# Patient Record
Sex: Female | Born: 1935 | Race: White | Hispanic: No | Smoking: Never smoker
Health system: Southern US, Community
[De-identification: ages and names within clinical notes are randomized; demographics above are authoritative.]

## PROBLEM LIST (undated history)

## (undated) DIAGNOSIS — S5290XA Unspecified fracture of unspecified forearm, initial encounter for closed fracture: Secondary | ICD-10-CM

## (undated) DIAGNOSIS — K449 Diaphragmatic hernia without obstruction or gangrene: Secondary | ICD-10-CM

## (undated) DIAGNOSIS — F0390 Unspecified dementia without behavioral disturbance: Secondary | ICD-10-CM

## (undated) DIAGNOSIS — N3941 Urge incontinence: Secondary | ICD-10-CM

## (undated) DIAGNOSIS — G4730 Sleep apnea, unspecified: Secondary | ICD-10-CM

## (undated) DIAGNOSIS — K259 Gastric ulcer, unspecified as acute or chronic, without hemorrhage or perforation: Secondary | ICD-10-CM

## (undated) DIAGNOSIS — I10 Essential (primary) hypertension: Secondary | ICD-10-CM

## (undated) DIAGNOSIS — K5790 Diverticulosis of intestine, part unspecified, without perforation or abscess without bleeding: Secondary | ICD-10-CM

## (undated) DIAGNOSIS — IMO0002 Reserved for concepts with insufficient information to code with codable children: Secondary | ICD-10-CM

## (undated) DIAGNOSIS — K317 Polyp of stomach and duodenum: Secondary | ICD-10-CM

## (undated) DIAGNOSIS — R011 Cardiac murmur, unspecified: Secondary | ICD-10-CM

## (undated) DIAGNOSIS — M1990 Unspecified osteoarthritis, unspecified site: Secondary | ICD-10-CM

## (undated) DIAGNOSIS — D649 Anemia, unspecified: Secondary | ICD-10-CM

## (undated) DIAGNOSIS — K219 Gastro-esophageal reflux disease without esophagitis: Secondary | ICD-10-CM

## (undated) DIAGNOSIS — K2970 Gastritis, unspecified, without bleeding: Secondary | ICD-10-CM

## (undated) DIAGNOSIS — D509 Iron deficiency anemia, unspecified: Secondary | ICD-10-CM

## (undated) HISTORY — DX: Gastritis, unspecified, without bleeding: K29.70

## (undated) HISTORY — DX: Essential (primary) hypertension: I10

## (undated) HISTORY — DX: Unspecified osteoarthritis, unspecified site: M19.90

## (undated) HISTORY — DX: Gastro-esophageal reflux disease without esophagitis: K21.9

## (undated) HISTORY — DX: Cardiac murmur, unspecified: R01.1

## (undated) HISTORY — PX: ABDOMINAL HYSTERECTOMY: SHX81

## (undated) HISTORY — PX: CATARACT EXTRACTION, BILATERAL: SHX1313

## (undated) HISTORY — DX: Anemia, unspecified: D64.9

## (undated) HISTORY — DX: Urge incontinence: N39.41

## (undated) HISTORY — DX: Diverticulosis of intestine, part unspecified, without perforation or abscess without bleeding: K57.90

## (undated) HISTORY — DX: Iron deficiency anemia, unspecified: D50.9

## (undated) HISTORY — DX: Unspecified fracture of unspecified forearm, initial encounter for closed fracture: S52.90XA

## (undated) HISTORY — DX: Reserved for concepts with insufficient information to code with codable children: IMO0002

## (undated) SURGERY — Surgical Case
Anesthesia: *Unknown

---

## 2010-10-01 ENCOUNTER — Telehealth (INDEPENDENT_AMBULATORY_CARE_PROVIDER_SITE_OTHER): Payer: Self-pay | Admitting: *Deleted

## 2010-10-01 ENCOUNTER — Ambulatory Visit: Payer: Self-pay | Admitting: Internal Medicine

## 2010-10-01 DIAGNOSIS — D649 Anemia, unspecified: Secondary | ICD-10-CM | POA: Insufficient documentation

## 2010-10-01 DIAGNOSIS — R195 Other fecal abnormalities: Secondary | ICD-10-CM | POA: Insufficient documentation

## 2010-10-01 DIAGNOSIS — K219 Gastro-esophageal reflux disease without esophagitis: Secondary | ICD-10-CM | POA: Insufficient documentation

## 2010-10-20 ENCOUNTER — Ambulatory Visit (HOSPITAL_COMMUNITY)
Admission: RE | Admit: 2010-10-20 | Discharge: 2010-10-20 | Payer: Self-pay | Source: Home / Self Care | Admitting: Internal Medicine

## 2010-10-20 ENCOUNTER — Telehealth (INDEPENDENT_AMBULATORY_CARE_PROVIDER_SITE_OTHER): Payer: Self-pay

## 2010-10-20 DIAGNOSIS — K5790 Diverticulosis of intestine, part unspecified, without perforation or abscess without bleeding: Secondary | ICD-10-CM | POA: Diagnosis present

## 2010-10-20 HISTORY — DX: Diverticulosis of intestine, part unspecified, without perforation or abscess without bleeding: K57.90

## 2010-11-19 ENCOUNTER — Encounter (INDEPENDENT_AMBULATORY_CARE_PROVIDER_SITE_OTHER): Payer: Self-pay

## 2010-11-20 ENCOUNTER — Encounter: Payer: Self-pay | Admitting: Internal Medicine

## 2010-11-25 ENCOUNTER — Encounter: Payer: Self-pay | Admitting: Internal Medicine

## 2010-12-08 ENCOUNTER — Ambulatory Visit (HOSPITAL_COMMUNITY)
Admission: RE | Admit: 2010-12-08 | Discharge: 2010-12-08 | Payer: Self-pay | Source: Home / Self Care | Attending: Internal Medicine | Admitting: Internal Medicine

## 2010-12-08 DIAGNOSIS — K2970 Gastritis, unspecified, without bleeding: Secondary | ICD-10-CM

## 2010-12-08 HISTORY — DX: Gastritis, unspecified, without bleeding: K29.70

## 2010-12-16 NOTE — Letter (Signed)
Summary: TCS ORDER  TCS ORDER   Imported By: Ave Filter 10/01/2010 12:19:01  _____________________________________________________________________  External Attachment:    Type:   Image     Comment:   External Document

## 2010-12-16 NOTE — Op Note (Signed)
  NAMEKRISANN, Wanda [name]             ACCOUNT NO.:  [id number]  MEDICAL RECORD NO.:  [id number]          PATIENT TYPE:  AMB  LOCATION:  DAY                           FACILITY:  [facility]  PHYSICIAN:  R. Roetta Sessions, M.D. DATE OF BIRTH:  February 22, 1933  DATE OF PROCEDURE:  12/08/2010 DATE OF DISCHARGE:                              OPERATIVE REPORT   INDICATIONS FOR PROCEDURE:  The patient is a 77 year old lady who has a significant anemia and occult blood positive stool.  She had a colonoscopy recently, longstanding GERD symptoms.  EGD is now being done to further evaluate longstanding GERD and upper GI tract as to cause which may be contributing to her anemia.  Risks, benefits, limitations, alternatives, imponderables have been reviewed.  Questions have been answered.  Please see the documentation for the medical record.  PROCEDURE NOTE:  O2 saturation, blood pressure, pulse, and respirations were monitored throughout the entirety of the procedure.  CONSCIOUS SEDATION:  Versed 3 mg IV, Demerol 75 mg IV in divided doses.  INSTRUMENT:  Pentax video chip system.  FINDINGS:  Examination of tubular esophagus revealed entirely normal- appearing mucosa.  EG junction was easily traversed.  Stomach insufflated well with air.  Thorough examination of the gastric mucosa including retroflexed proximal stomach and esophagogastric junction demonstrated a moderate size hiatal hernia, some focal antral erythema but no erosion, ulcer, or infiltrating process were seen.  Pylorus was patent, easily traversed.  Examination of the bulb and second portion revealed only a duodenal diverticulum in the second portion of duodenum.  THERAPEUTIC/DIAGNOSTIC MANEUVERS PERFORMED: 1. Biopsies of the D2 were taken for histologic study. 2. Biopsies of the antral mucosa were taken.  The patient tolerated     the procedure well, was reactive in endoscopy.  IMPRESSION: 1. Normal esophagus.  Moderate-sized hiatal hernia,  focal antral     erythema of uncertain significance status post biopsy. 2. Duodenal diverticulum, otherwise D1 through D2 appeared normal     status post biopsy D2.  RECOMMENDATIONS: 1. GERD literature provided to Ms. Catoe. 2. Continue omeprazole 20 mg orally daily. 3. Follow up on path. 4. Further recommendations to follow.     Jonathon Bellows, M.D.     RMR/MEDQ  D:  12/08/2010  T:  12/08/2010  Job:  [id number]  Electronically Signed by Lorrin Goodell M.D. on 12/16/2010 01:40:06 PM

## 2010-12-16 NOTE — Progress Notes (Signed)
Summary: [facility] procedure to the week of 12/5  Phone Note Call from Patient   Summary of Call: Pt needs to [facility] her procedure. She is asking to have it done the week of Dec 5th. You can reach her at [phone] Initial call taken by: Diana Eves,  October 01, 2010 1:57 PM     Appended Document: [facility] procedure to the week of 12/5 Pt rescheduled to 10/20/10@10 :00..Pt aware of appt change.

## 2010-12-16 NOTE — Progress Notes (Signed)
Summary: phone note/ prep rx called to pharmacy  Phone Note Other Incoming   Caller: Britta Mccreedy from Day [facility] Summary of Call: Per Britta Mccreedy in [facility], Dr. Jena Gauss said call in 2L Go [place]yt[place]...8 0z every 15 min til gone. I called to Reed @ [facility] in [place]. Initial call taken by: Cloria Spring LPN,  October 20, 2010 8:21 AM

## 2010-12-16 NOTE — Letter (Signed)
Summary: REFERRAL FROM DR Jarold Motto  REFERRAL FROM DR PATTERSON   Imported By: Rexene Alberts 10/01/2010 16:36:16  _____________________________________________________________________  External Attachment:    Type:   Image     Comment:   External Document

## 2010-12-17 ENCOUNTER — Encounter: Payer: Self-pay | Admitting: Internal Medicine

## 2010-12-18 ENCOUNTER — Encounter: Payer: Self-pay | Admitting: Internal Medicine

## 2010-12-18 NOTE — Letter (Signed)
Summary: TRIAGE ORDER  TRIAGE ORDER   Imported By: Ave Filter 11/25/2010 10:50:16  _____________________________________________________________________  External Attachment:    Type:   Image     Comment:   External Document  Appended Document: TRIAGE ORDER ok as is

## 2010-12-18 NOTE — Assessment & Plan Note (Signed)
Summary: HEME+STOOLS/SS   Visit Type:  Initial Consult Referring Provider:  Ninfa Linden, FNP Primary Care Provider:  Ninfa Linden, FNP  CC:  Heme pos stools.  History of Present Illness: Wanda [name] is a 77 year old Caucasian female who presents today at the request of Ms. Ninfa Linden, FNP secondary to heme + stools. Reports 1-2 BMs daily, soft. if eating out reports postprandial  diarrhea, otherwise normal BM daily. Denies melena, hematochezia. Denies abdominal pain. Denies N/V. Reports losing 8 lbs earlier this year, six months later regained. Had stopped drinking high caloric drinks. Now back to baseline. No lack of appetite. GERD controlled on once daily Prilosec. Reports intermittent fatigue, but she states she is very active.  H/H drawn 11/10: 10.4, 32. Was prescribed iron supplement but hasn't started yet. Ferritin normal. Reports no hx of anemia. No prior TCS.    H/H drawn 11/10: 10.4, 32, MCV: 85 (nl), MCH 27.5 (nl), RDW: 14.6% (nl) TIBC: 402 (nl), Ferritin: 23 (nl)  Current Medications (verified): 1)  Omeprazole 20 Mg Cpdr (Omeprazole) .... Take 1 Tablet By Mouth Once A Day 2)  Lisinopril 10 Mg Tabs (Lisinopril) .... Take 1 Tablet By Mouth Once A Day 3)  Vitamin D 2000 Iu .... Take 1 Tablet By Mouth Once A Day 4)  Aspir-Low 81 Mg Tbec (Aspirin) .... Take 1 Tablet By Mouth Once A Day  Allergies (verified): 1)  ! * Aleve  Past History:  Past Medical History: HTN GERD Vitamin D deficiency OA heart murmur  Past Surgical History: None  Family History: Mother: 46, HTN Father: 34, no significant hx siblings: 2 brothers, incidence of lymphoma No FH of Colon Cancer:  Social History: Widowed X 6 years  Patient has never smoked.  Alcohol Use - no Patient gets regular exercise. Smoking Status:  never Does Patient Exercise:  yes  Review of Systems General:  Denies fever, chills, and anorexia. Eyes:  Denies blurring, irritation, and discharge. ENT:   Denies sore throat, hoarseness, and difficulty swallowing. CV:  Denies chest pains and dyspnea on exertion. Resp:  Denies dyspnea at rest, dyspnea with exercise, and wheezing. GI:  Denies difficulty swallowing, pain on swallowing, nausea, abdominal pain, constipation, bloody BM's, and black BMs. GU:  Denies urinary burning and blood in urine. MS:  Denies joint pain / LOM and joint deformity. Derm:  Denies rash, itching, and dry skin. Neuro:  Denies weakness and syncope. Psych:  Denies depression and anxiety.  Vital Signs:  Patient profile:   77 year old female Height:      61 inches Weight:      141 pounds BMI:     26.74 Temp:     97.9 degrees F oral Pulse rate:   76 / minute BP sitting:   138 / 78  (right arm) Cuff size:   regular  Vitals Entered By: Cloria Spring LPN (October 01, 2010 10:36 AM)  Impression & Recommendations:  Problem # 1:  FECAL OCCULT BLOOD (ICD-31.27)  77 year old female with no hx of prior colonoscopy, heme+ stool, denies abd pain, N/V. Denies melena, hematochezia. Normal BM daily, postprandial diarrhea if "eats out". no prior hx of anemia but recent labs with H/H: 10.4, 32. Ferritin normal.   TCS with Dr. Jena Gauss: the risks and benefits have been discussed in detail with pt; she desires to proceed and gave verbal consent  Orders: Consultation Level III (04540)  Problem # 2:  ANEMIA-UNSPECIFIED (ICD-285.9)  No prior hx of anemia, TCS to be performed in near future.  See #1.   Orders: Consultation Level III ([address])  Problem # 3:  GERD (ICD-530.81)  Hx of GERD but well-controlled without breakthrough symptoms on once daily PPI.  Continue current management  Orders: Consultation Level III [phone]) We would like to thank Ms. Ninfa Linden, FNP, for the referral of this pleasant lady.

## 2010-12-18 NOTE — Miscellaneous (Signed)
Summary: cbc from [facility]  Clinical Lists Changes               Perform Date: 5 Dec11 14:25  Ordered By: Jena Gauss MD , Gerrit Friends           Ordered Date: 5 Dec11 14:22                                       Last Updated Date: 5 Dec11 15:49  Facility: [facility]                               Department: [facility]  Accession #: [id number]                    USN:       [id number]  Findings  Result Name                              Result     Abnl   Normal Range     Units      Perf. Loc.  WBC                                           9.1               4.0-10.5         K/uL  RBC                                           3.45       l      3.87-5.11        MIL/uL  Hemoglobin (HGB)                        9.3        l      12.0-15.0        g/dL  Hematocrit (HCT)                         28.5       l      36.0-46.0        %  MCV                                           82.6              78.0-100.0       fL  MCH -                                         27.0              26.0-34.0        pg  MCHC  32.6              30.0-36.0        g/dL  RDW                                           14.3              11.5-15.5        %  Platelet Count (PLT)                     435        h      150-400          K/uL  Additional Information  HL7 RESULT STATUS : F  External IF Update Timestamp : 2010-10-20:15:47:00.000000  Appended Document: cbc from [facility] anemia more pronounced; I don't see seum Fe or sat results regarding fe studies - they need to be in EMR - pt needs and EGD if not recently done to further evaluate longstanding GERD, anemia and blood in stool  Appended Document: cbc from [facility] called pt- LM with above information per pt request  Appended Document: cbc from [facility] Pt scheduled for egd on 12/08/08@10 :00a.m.

## 2010-12-24 LAB — CONVERTED CEMR LAB
Basophils Absolute: 0.1 10*3/uL (ref 0.0–0.1)
Basophils Relative: 1 % (ref 0–1)
Eosinophils Absolute: 0.5 10*3/uL (ref 0.0–0.7)
Eosinophils Relative: 6 % — ABNORMAL HIGH (ref 0–5)
HCT: 34 % — ABNORMAL LOW (ref 36.0–46.0)
Hemoglobin: 10.4 g/dL — ABNORMAL LOW (ref 12.0–15.0)
Lymphocytes Relative: 36 % (ref 12–46)
Lymphs Abs: 2.9 10*3/uL (ref 0.7–4.0)
MCHC: 30.6 g/dL (ref 30.0–36.0)
MCV: 86.1 fL (ref 78.0–100.0)
Monocytes Absolute: 0.6 10*3/uL (ref 0.1–1.0)
Monocytes Relative: 7 % (ref 3–12)
Neutro Abs: 4 10*3/uL (ref 1.7–7.7)
Neutrophils Relative %: 50 % (ref 43–77)
Platelets: 389 10*3/uL (ref 150–400)
RBC: 3.95 M/uL (ref 3.87–5.11)
RDW: 17.2 % — ABNORMAL HIGH (ref 11.5–15.5)
WBC: 8 10*3/uL (ref 4.0–10.5)

## 2010-12-24 NOTE — Miscellaneous (Signed)
Summary: Orders Update  Clinical Lists Changes  Orders: Added new Test order of T-CBC w/Diff (85025-10010) - Signed 

## 2010-12-26 ENCOUNTER — Encounter (INDEPENDENT_AMBULATORY_CARE_PROVIDER_SITE_OTHER): Payer: Medicare Other | Admitting: Internal Medicine

## 2010-12-26 ENCOUNTER — Encounter: Payer: Self-pay | Admitting: Internal Medicine

## 2010-12-26 DIAGNOSIS — D649 Anemia, unspecified: Secondary | ICD-10-CM

## 2011-01-01 NOTE — Assessment & Plan Note (Addendum)
Summary: DROPPED STOOL OFF/LAW  pt returned ifobt and it was negative  Allergies: 1)  ! * Aleve   Other Orders: Immuno-chemical Fecal Occult ([address])   Orders Added: 1)  Immuno-chemical Fecal Occult [82274]  Appended Document: DROPPED STOOL OFF/LAW PT IS AWARE OF THE RESULTS BUT IS QUESTIONING WHAT DOES SHE NEED TO DO NEXT?  Appended Document: DROPPED STOOL OFF/LAW repeat cbc w ov extender 8 weeks  Appended Document: DROPPED STOOL OFF/LAW LMOM with above information. lab order on file  Appended Document: DROPPED STOOL OFF/LAW LMOM with OV of 4/23 @ 1030 w/KJ. I asked for pt to call me back to confirm OV  Appended Document: DROPPED STOOL OFF/LAW pt is aware with appt and knows to have her labs done prior

## 2011-01-13 ENCOUNTER — Encounter: Payer: Self-pay | Admitting: Internal Medicine

## 2011-01-22 NOTE — Miscellaneous (Signed)
Summary: Orders Update  Clinical Lists Changes  Orders: Added new Test order of T-CBC w/Diff (85025-10010) - Signed 

## 2011-01-23 ENCOUNTER — Encounter (INDEPENDENT_AMBULATORY_CARE_PROVIDER_SITE_OTHER): Payer: Self-pay

## 2011-01-27 LAB — CBC
HCT: 28.5 % — ABNORMAL LOW (ref 36.0–46.0)
Hemoglobin: 9.3 g/dL — ABNORMAL LOW (ref 12.0–15.0)
MCH: 27 pg (ref 26.0–34.0)
MCHC: 32.6 g/dL (ref 30.0–36.0)
MCV: 82.6 fL (ref 78.0–100.0)
Platelets: 435 10*3/uL — ABNORMAL HIGH (ref 150–400)
RBC: 3.45 MIL/uL — ABNORMAL LOW (ref 3.87–5.11)
RDW: 14.3 % (ref 11.5–15.5)
WBC: 9.1 10*3/uL (ref 4.0–10.5)

## 2011-01-27 NOTE — Letter (Signed)
Summary: Recall, Labs Needed  [facility]  [address]   [place], [place] [address]   Phone: [phone]  Fax: [phone]    January 23, 2011  [facility] [address] [place], [place]  [address]  [place] Mar 08, 1933   Dear Wanda [name],   Our records indicate it is time to repeat your blood work.  You can take the enclosed form to the lab on or near the date indicated.  Please make note of the new location of the lab:   [address], [address]nd floor   [facility]  Our office will call you within a week to ten business days with the results.  If you do not hear from [place] in 10 business days, you should call the office.  If you have any questions regarding this, call the office at [phone], and ask for the nurse.  Labs are due on 02/18/11.   Sincerely,    Hendricks Limes LPN  [facility] Ph: [phone]   Fax: [phone]

## 2011-03-02 ENCOUNTER — Other Ambulatory Visit: Payer: Self-pay | Admitting: Internal Medicine

## 2011-03-02 LAB — CBC WITH DIFFERENTIAL/PLATELET
Basophils Absolute: 0.1 10*3/uL (ref 0.0–0.1)
Basophils Relative: 1 % (ref 0–1)
Eosinophils Absolute: 0.4 10*3/uL (ref 0.0–0.7)
Eosinophils Relative: 4 % (ref 0–5)
HCT: 35 % — ABNORMAL LOW (ref 36.0–46.0)
Hemoglobin: 11 g/dL — ABNORMAL LOW (ref 12.0–15.0)
Lymphocytes Relative: 34 % (ref 12–46)
Lymphs Abs: 3.2 10*3/uL (ref 0.7–4.0)
MCH: 27.2 pg (ref 26.0–34.0)
MCHC: 31.4 g/dL (ref 30.0–36.0)
MCV: 86.6 fL (ref 78.0–100.0)
Monocytes Absolute: 0.5 10*3/uL (ref 0.1–1.0)
Monocytes Relative: 6 % (ref 3–12)
Neutro Abs: 5.2 10*3/uL (ref 1.7–7.7)
Neutrophils Relative %: 56 % (ref 43–77)
Platelets: 359 10*3/uL (ref 150–400)
RBC: 4.04 MIL/uL (ref 3.87–5.11)
RDW: 15.9 % — ABNORMAL HIGH (ref 11.5–15.5)
WBC: 9.3 10*3/uL (ref 4.0–10.5)

## 2011-03-09 ENCOUNTER — Ambulatory Visit (INDEPENDENT_AMBULATORY_CARE_PROVIDER_SITE_OTHER): Payer: Medicare Other | Admitting: Urgent Care

## 2011-03-09 ENCOUNTER — Encounter: Payer: Self-pay | Admitting: Urgent Care

## 2011-03-09 VITALS — BP 160/82 | HR 71 | Temp 97.9°F | Ht 61.0 in | Wt 142.6 lb

## 2011-03-09 DIAGNOSIS — D649 Anemia, unspecified: Secondary | ICD-10-CM

## 2011-03-09 DIAGNOSIS — K219 Gastro-esophageal reflux disease without esophagitis: Secondary | ICD-10-CM

## 2011-03-09 NOTE — Assessment & Plan Note (Signed)
GERD/gastritis (non-H. pylori) well-controlled on omeprazole 20 mg daily.

## 2011-03-09 NOTE — Progress Notes (Signed)
Referring Provider: Ninfa Linden, NP Primary Care Physician:  Ninfa Linden, NP  Chief Complaint  Patient presents with  . Follow-up    tired    HPI:  Wanda [name] is a 77 y.o. female here for follow up for chronic anemia. She was found to have chronic anemia with a hemoglobin in the 9 range approximately 5 months ago and Hemoccult-positive stool. She underwent evaluation by Dr. work with colonoscopy and EGD which showed no H. pylori gastritis and left-sided diverticulosis. She denies any rectal bleeding or melena. Her only concern is some fatigue which is gradually getting better. She previously had a normal ferritin and iron. She has discontinued aspirin 81 mg daily. She is taking omeprazole 20 mg daily for history of GERD which is well controlled. She denies any abdominal pain, anorexia, nausea or vomiting. She denies any dysphagia or odynophagia.  Past Medical History  Diagnosis Date  . Anemia     Last hemoglobin 11 on 03/02/11  . Heme positive stool 09/2010     ifobt negative 4/12  . GERD (gastroesophageal reflux disease)   . Gastritis 12/08/10    EGD by Dr. Nancy Fetter hiatal hernia, duodenal diverticulum, chronic gastritis  . Diverticulosis 10/20/10    Left-sided on colonoscopy by Dr. Jena Gauss 10/20/10  . HTN (hypertension)   . Vitamin D deficiency   . Osteoarthritis   . Heart murmur     No past surgical history on file.  Current Outpatient Prescriptions  Medication Sig Dispense Refill  . lisinopril (PRINIVIL,ZESTRIL) 20 MG tablet Take 20 mg by mouth daily.        Marland Kitchen omeprazole (PRILOSEC) 20 MG capsule Take 20 mg by mouth daily.        Marland Kitchen UNABLE TO FIND vitaimin d daily      . VITAMIN D, CHOLECALCIFEROL, PO Take 400 Int'l Units/day by mouth daily.          Allergies as of 03/09/2011 - Review Complete 03/09/2011  Allergen Reaction Noted  . Naproxen sodium     Family history: There is no known family history of colorectal carcinoma , liver disease, or inflammatory  bowel disease.  Review of Systems: Gen: Denies any fever, chills, sweats, anorexia, weakness, malaise, weight loss, and sleep disorder CV: Denies chest pain, angina, palpitations, syncope, orthopnea, PND, peripheral edema, and claudication. Resp: Denies dyspnea at rest, dyspnea with exercise, cough, sputum, wheezing, coughing up blood, and pleurisy. GI: Denies vomiting blood, jaundice, and fecal incontinence.   Denies dysphagia or odynophagia. Derm: Denies rash, itching, dry skin, hives, moles, warts, or unhealing ulcers.  Psych: Denies depression, anxiety, memory loss, suicidal ideation, hallucinations, paranoia, and confusion. Heme: Denies bruising, bleeding, and enlarged lymph nodes.  Physical Exam: BP 160/82  Pulse 71  Temp 97.9 F (36.6 C)  Ht 5\' 1"  (1.549 m)  Wt 142 lb 9.6 oz (64.683 kg)  BMI 26.94 kg/m2 General:   Alert,  Well-developed, well-nourished, pleasant and cooperative in NAD. Head:  Normocephalic and atraumatic. Eyes:  Sclera clear, no icterus.   Conjunctiva pink. Mouth:  No deformity or lesions, dentition normal. Neck:  Supple; no masses or thyromegaly. Heart:  Regular rate and rhythm; no murmurs, clicks, rubs,  or gallops. Abdomen:  Soft, nontender and nondistended. No masses, hepatosplenomegaly or hernias noted. Normal bowel sounds, without guarding, and without rebound.   Msk:  Symmetrical without gross deformities. Normal posture. Pulses:  Normal pulses noted. Extremities:  Without clubbing or edema. Neurologic:  Alert and  oriented x4;  grossly normal neurologically.  Skin:  Intact without significant lesions or rashes. Cervical Nodes:  No significant cervical adenopathy. Psych:  Alert and cooperative. Normal mood and affect.

## 2011-03-09 NOTE — Assessment & Plan Note (Addendum)
Wanda [name] is a 77 year old Caucasian female with recent diagnosis of normocytic anemia and positive Hemoccult who has under went colonoscopy and EGD to look for occult GI bleed or malignancy. She does have non-H. pylori gastritis and in the setting of aspirin and this could be a cause of trivial GI bleed. She denies any gross bleeding. Her hemoglobin has improved almost 2 g.  Last iFobt negative. She does have chronic GERD well controlled on PPI. At this point would not pursue further GI workup, however if hemoglobin declines, would consider small bowel given capsule study to evaluate her small bowel for occult lesions.  Begin a multivitamin with iron Recheck hemoglobin in 2 months Call if any signs of bleeding Follow up with your primary care physician Continue omeprazole 20 mg daily Resume aspirin 81 mg daily

## 2011-03-09 NOTE — Patient Instructions (Addendum)
Recheck CBC in 2months Call if any blood in stools Begin Multivitamin with iron daily You may resume baby ASA 81mg  daily Continue omeprazole 20 mg daily

## 2011-06-15 ENCOUNTER — Telehealth: Payer: Self-pay | Admitting: Urgent Care

## 2011-06-15 NOTE — Telephone Encounter (Signed)
Mailed reminder letter to pt. 

## 2011-06-15 NOTE — Telephone Encounter (Signed)
Did pt get CBC done as planned?

## 2011-06-18 ENCOUNTER — Other Ambulatory Visit: Payer: Self-pay | Admitting: Urgent Care

## 2011-06-19 LAB — CBC WITH DIFFERENTIAL/PLATELET
Basophils Absolute: 0 10*3/uL (ref 0.0–0.1)
Basophils Relative: 0 % (ref 0–1)
Eosinophils Absolute: 0.2 10*3/uL (ref 0.0–0.7)
Eosinophils Relative: 2 % (ref 0–5)
HCT: 37.2 % (ref 36.0–46.0)
Hemoglobin: 11.6 g/dL — ABNORMAL LOW (ref 12.0–15.0)
Lymphocytes Relative: 33 % (ref 12–46)
Lymphs Abs: 3 10*3/uL (ref 0.7–4.0)
MCH: 29.2 pg (ref 26.0–34.0)
MCHC: 31.2 g/dL (ref 30.0–36.0)
MCV: 93.7 fL (ref 78.0–100.0)
Monocytes Absolute: 0.5 10*3/uL (ref 0.1–1.0)
Monocytes Relative: 6 % (ref 3–12)
Neutro Abs: 5.1 10*3/uL (ref 1.7–7.7)
Neutrophils Relative %: 58 % (ref 43–77)
Platelets: 298 10*3/uL (ref 150–400)
RBC: 3.97 MIL/uL (ref 3.87–5.11)
RDW: 14.4 % (ref 11.5–15.5)
WBC: 8.9 10*3/uL (ref 4.0–10.5)

## 2011-06-19 NOTE — Progress Notes (Signed)
Quick Note:  Please call pt & let her know her hgb is much better/almost normal. FU w/ her PCP. Call if she has any bleeding or GI problems. Thanks ______

## 2011-11-28 ENCOUNTER — Emergency Department (HOSPITAL_COMMUNITY): Payer: Medicare Other

## 2011-11-28 ENCOUNTER — Encounter (HOSPITAL_COMMUNITY): Payer: Self-pay

## 2011-11-28 ENCOUNTER — Emergency Department (HOSPITAL_COMMUNITY)
Admission: EM | Admit: 2011-11-28 | Discharge: 2011-11-28 | Disposition: A | Discharge status: Home / Self Care | Payer: Medicare Other | Attending: Emergency Medicine | Admitting: Emergency Medicine

## 2011-11-28 DIAGNOSIS — I10 Essential (primary) hypertension: Secondary | ICD-10-CM | POA: Insufficient documentation

## 2011-11-28 DIAGNOSIS — S62101A Fracture of unspecified carpal bone, right wrist, initial encounter for closed fracture: Secondary | ICD-10-CM

## 2011-11-28 DIAGNOSIS — M19049 Primary osteoarthritis, unspecified hand: Secondary | ICD-10-CM | POA: Insufficient documentation

## 2011-11-28 DIAGNOSIS — S59909A Unspecified injury of unspecified elbow, initial encounter: Secondary | ICD-10-CM | POA: Insufficient documentation

## 2011-11-28 DIAGNOSIS — S52599A Other fractures of lower end of unspecified radius, initial encounter for closed fracture: Secondary | ICD-10-CM | POA: Insufficient documentation

## 2011-11-28 DIAGNOSIS — Z79899 Other long term (current) drug therapy: Secondary | ICD-10-CM | POA: Insufficient documentation

## 2011-11-28 DIAGNOSIS — M25539 Pain in unspecified wrist: Secondary | ICD-10-CM | POA: Insufficient documentation

## 2011-11-28 DIAGNOSIS — M79609 Pain in unspecified limb: Secondary | ICD-10-CM | POA: Insufficient documentation

## 2011-11-28 DIAGNOSIS — R5381 Other malaise: Secondary | ICD-10-CM | POA: Insufficient documentation

## 2011-11-28 DIAGNOSIS — W010XXA Fall on same level from slipping, tripping and stumbling without subsequent striking against object, initial encounter: Secondary | ICD-10-CM | POA: Insufficient documentation

## 2011-11-28 DIAGNOSIS — K219 Gastro-esophageal reflux disease without esophagitis: Secondary | ICD-10-CM | POA: Insufficient documentation

## 2011-11-28 DIAGNOSIS — S6990XA Unspecified injury of unspecified wrist, hand and finger(s), initial encounter: Secondary | ICD-10-CM | POA: Insufficient documentation

## 2011-11-28 NOTE — ED Provider Notes (Signed)
Medical screening examination/treatment/procedure(s) were conducted as a shared visit with non-physician practitioner(s) and myself.  I personally evaluated the patient during the encounter.  Please see completed note for this visit.  Raeford Razor, MD 11/28/11 2125

## 2011-11-28 NOTE — ED Provider Notes (Signed)
Medical screening examination/treatment/procedure(s) were conducted as a shared visit with non-physician practitioner(s) and myself.  I personally evaluated the patient during the encounter.  96EA with R wrist pain. FOOSH. Buckle fracture distal radius. No intrarticular involvement. No significant tenderness in snuffbox. Neurovascularly intact distally. Plan splint and pain meds. Ortho fu.  Raeford Razor, MD 11/28/11 2013

## 2011-11-28 NOTE — ED Provider Notes (Signed)
History     CSN: [id number]  Arrival date & time 11/28/11  4098   First MD Initiated Contact with Patient 11/28/11 2011      Chief Complaint  Patient presents with  . Wrist Pain  . Hand Pain    (Consider location/radiation/quality/duration/timing/severity/associated sxs/prior treatment) HPI Comments: Patient states she slipped on a slippery wet deck. She fell on an outstretched hand in the grass. She sustained injury to the right wrist. She denies any other injury. He denies being on blood thinning medications. She has not had previous surgery or procedures on the right hand or wrist. She presents now for assistance with this particular problem.  Patient is a 78 y.o. female presenting with wrist pain and hand pain. The history is provided by the patient.  Wrist Pain Associated symptoms include arthralgias and fatigue. Pertinent negatives include no abdominal pain, chest pain, coughing or neck pain.  Hand Pain Associated symptoms include arthralgias and fatigue. Pertinent negatives include no abdominal pain, chest pain, coughing or neck pain.    Past Medical History  Diagnosis Date  . Anemia     Last hemoglobin 11 on 03/02/11  . Heme positive stool 09/2010     ifobt negative 4/12  . GERD (gastroesophageal reflux disease)   . Gastritis 12/08/10    EGD by Dr. Nancy Fetter hiatal hernia, duodenal diverticulum, chronic gastritis  . Diverticulosis 10/20/10    Left-sided on colonoscopy by Dr. Jena Gauss 10/20/10  . HTN (hypertension)   . Vitamin D deficiency   . Osteoarthritis   . Heart murmur     History reviewed. No pertinent past surgical history.  Family History  Problem Relation Age of Onset  . Hypertension Mother   . Lymphoma Brother     History  Substance Use Topics  . Smoking status: Never Smoker   . Smokeless tobacco: Not on file  . Alcohol Use: No    OB History    Grav Para Term Preterm Abortions TAB SAB Ect Mult Living                  Review of Systems    Constitutional: Positive for fatigue. Negative for activity change.       All ROS Neg except as noted in HPI  HENT: Negative for nosebleeds and neck pain.   Eyes: Negative for photophobia and discharge.  Respiratory: Negative for cough, shortness of breath and wheezing.   Cardiovascular: Negative for chest pain and palpitations.  Gastrointestinal: Negative for abdominal pain and blood in stool.  Genitourinary: Negative for dysuria, frequency and hematuria.  Musculoskeletal: Positive for arthralgias. Negative for back pain.  Skin: Negative.   Neurological: Negative for dizziness, seizures and speech difficulty.  Psychiatric/Behavioral: Negative for hallucinations and confusion.    Allergies  Naproxen sodium  Home Medications   Current Outpatient Rx  Name Route Sig Dispense Refill  . LISINOPRIL 20 MG PO TABS Oral Take 20 mg by mouth daily.      Marland Kitchen OMEPRAZOLE 20 MG PO CPDR Oral Take 20 mg by mouth daily.      Marland Kitchen UNABLE TO FIND  vitaimin d daily    . VITAMIN D (CHOLECALCIFEROL) PO Oral Take 400 Int'l Units/day by mouth daily.        BP 181/80  Pulse 80  Temp(Src) 98.5 F (36.9 C) (Oral)  Resp 20  Ht 5\' 2"  (1.575 m)  Wt 135 lb (61.236 kg)  BMI 24.69 kg/m2  SpO2 100%  Physical Exam  Nursing note and vitals  reviewed. Constitutional: She is oriented to person, place, and time. She appears well-developed and well-nourished.  Non-toxic appearance.  HENT:  Head: Normocephalic.  Right Ear: Tympanic membrane and external ear normal.  Left Ear: Tympanic membrane and external ear normal.  Eyes: EOM and lids are normal. Pupils are equal, round, and reactive to light.  Neck: Normal range of motion. Neck supple. Carotid bruit is not present.  Cardiovascular: Normal rate, regular rhythm, normal heart sounds, intact distal pulses and normal pulses.   Pulmonary/Chest: Breath sounds normal. No respiratory distress. She exhibits no tenderness.  Abdominal: Soft. Bowel sounds are normal. There  is no tenderness. There is no guarding.  Musculoskeletal: Normal range of motion.       There is good capillary refill and sensory of the fingers of the right hand. There are degenerative joint disease changes of the MP joints of both hands. There is deformity of the right wrist. There is no pain in the anatomical snuffbox. Full range of motion of the right elbow and shoulder.  Lymphadenopathy:       Head (right side): No submandibular adenopathy present.       Head (left side): No submandibular adenopathy present.    She has no cervical adenopathy.  Neurological: She is alert and oriented to person, place, and time. She has normal strength. No cranial nerve deficit or sensory deficit.  Skin: Skin is warm and dry.  Psychiatric: She has a normal mood and affect. Her speech is normal.    ED Course: Elevated blood pressure noted. Patient has history of the same. She will have this rechecked by her primary physician next week.   Procedures (including critical care time) Pulse oximetry 100% on room air. Within normal limits by my interpretation. Labs Reviewed - No data to display Dg Wrist Complete Right  11/28/2011  *RADIOLOGY REPORT*  Clinical Data: 78 year old female status post fall with pain and swelling.  RIGHT WRIST - COMPLETE 3+ VIEW  Comparison: None.  Findings: Impacted distal right radius fracture has a torus configuration.  No significant displacement or angulation.  The fracture line does extend to the distal radial ulnar joint.  No definite radiocarpal joint involvement.  Incidental chondrocalcinosis which can be seen in the setting of calcium pyrophosphate deposition disease.  Carpal bone alignment is preserved.  Scaphoid appears intact.  Visualized osseous structures in the right hand appear intact.  IMPRESSION: Torus type impacted distal right radius fracture, nondisplaced.  Original Report Authenticated By: Harley Hallmark, M.D.     1. Wrist fracture, right       MDM  I have  reviewed nursing notes, vital signs, and all appropriate lab and imaging results for this patient. Patient sustained a fall on a slippery surface and fell on an outstretched right hand. She sustained a fracture of the right radius. The patient did not sustain injury of any other areas. A sugar tong splint and sling have been applied. The patient is referred to orthopedics. She states she would like to use ibuprofen for her pain at this point.  Patient seen with me by Dr. Juleen China.       Kathie Dike, [place] 11/28/11 2019

## 2011-11-28 NOTE — ED Notes (Signed)
Pt presents with right wrist and hand swelling. Pt states she slipped and fell bracing herself with her right hand. Pt denies LOC. Bruising and swelling noted.

## 2011-11-28 NOTE — ED Notes (Signed)
Pt a/ox4. Resp even and unlabored. NAD at this time. D/C instructions reviewed with pt. Pt verbalized understanding. Pt ambulated to lobby with steady gate.  

## 2011-11-30 ENCOUNTER — Encounter: Payer: Self-pay | Admitting: Orthopedic Surgery

## 2011-11-30 ENCOUNTER — Ambulatory Visit (INDEPENDENT_AMBULATORY_CARE_PROVIDER_SITE_OTHER): Payer: Medicare Other | Admitting: Orthopedic Surgery

## 2011-11-30 VITALS — Ht 62.0 in | Wt 132.0 lb

## 2011-11-30 DIAGNOSIS — S52509A Unspecified fracture of the lower end of unspecified radius, initial encounter for closed fracture: Secondary | ICD-10-CM

## 2011-11-30 DIAGNOSIS — S52599A Other fractures of lower end of unspecified radius, initial encounter for closed fracture: Secondary | ICD-10-CM

## 2011-11-30 MED ORDER — HYDROCODONE-ACETAMINOPHEN 5-325 MG PO TABS: 1.0000 | ORAL_TABLET | ORAL | Status: AC | PRN

## 2011-11-30 NOTE — Progress Notes (Signed)
Patient ID: Wanda [name], female   DOB: 08-26-1933, 78 y.o.   MRN: [id number] Chief complaint: RIGHT wrist HPI:(4) Location of pain RIGHT arm, started January 12 secondary to a fall.  Previous treatment in the emergency room splinting and x-rays were done.  Symptoms include dull pain.  Pain scale is 6/10.  Timing constant.  Improved at this point with no measures that have been initiated.  Associated findings swelling.  Review of systems: Complete and comprehensive system review.  Positive findings fatigue difficulty urinating secondary to prolapsed uterus.  All other symptoms were negative.  ROS:(2) As above  PFSH: (1) As above Past Medical History  Diagnosis Date  . Anemia     Last hemoglobin 11 on 03/02/11  . Heme positive stool 09/2010     ifobt negative 4/12  . GERD (gastroesophageal reflux disease)   . Gastritis 12/08/10    EGD by Dr. Nancy Fetter hiatal hernia, duodenal diverticulum, chronic gastritis  . Diverticulosis 10/20/10    Left-sided on colonoscopy by Dr. Jena Gauss 10/20/10  . HTN (hypertension)   . Vitamin D deficiency   . Osteoarthritis   . Heart murmur      Physical Exam(12) GENERAL: normal development   CDV: pulses are normal   Skin: normal  Lymph: nodes were not palpable/normal  Psychiatric: awake, alert and oriented  Neuro: normal sensation  MSK Ambulation is normal 1 On inspection there is no gross deformity of the wrist.  There is tenderness to palpation decreased range of motion normal muscle tone and no instability. 2 Lower extremity exam  Ambulation is normal.  Inspection and palpation RIGHT upper extremity: revealed no tenderness or abnormality in alignment in the lower extremities. Range of motion is full.  Strength is grade 5.  In all joints are stable.  3 The LEFT upper extremity reveals no contracture subluxation atrophy tremor or malalignment   Imaging: The films were done at the hospital multiple views RIGHT wrist essentially she has  a nondisplaced fracture of the distal radius.  Assessment: Nondisplaced distal radius fracture    Plan: Brace for 6 weeks Norco 5 mg q.4 p.r.n. For pain #60 with one refill x-ray in 6 weeks.

## 2011-11-30 NOTE — Patient Instructions (Signed)
BRACE X 6 WEEKS  OK TO REMOVE TO BATHE

## 2011-12-08 ENCOUNTER — Telehealth: Payer: Self-pay | Admitting: Orthopedic Surgery

## 2011-12-08 NOTE — Telephone Encounter (Signed)
Wanda [name] was seen 11/30/11 for fractured right wrist.  She is concerned that her fingers on right hand are swollen  And seems whiter than other fingers.  Said there is no numbness and she can bend them.  She said the brace feels ok, not too tight.  Asked if this is normal

## 2011-12-09 NOTE — Telephone Encounter (Signed)
To brenda

## 2011-12-09 NOTE — Telephone Encounter (Signed)
Tried 2 times, both times busy

## 2011-12-09 NOTE — Telephone Encounter (Signed)
SHOULD BE OK IF THINGS CHANGE MAKE APPT FOR TO HAVE REVIEWED

## 2011-12-10 NOTE — Telephone Encounter (Signed)
Patient advised.

## 2011-12-18 ENCOUNTER — Telehealth: Payer: Self-pay | Admitting: *Deleted

## 2011-12-18 NOTE — Telephone Encounter (Signed)
Patient concerned about fracture, states she is still having pain and at times it is worse than when it was first injured. States she cannot grasp anything with her fingers and feels as though she is losing use of her hand. States she is also concerned about the shoulder of affected arm as well because of limited ROM. Follow up appointment is the end of Feb. She wants to know if she should come in earlier due to her concerns about fracture not healing properly. States she is still having to take the pain meds and feels as though she shouldn't have to take any at this point.

## 2011-12-19 NOTE — Telephone Encounter (Signed)
Its only been 2-3 weeks  The fracture will not heal that fast   Ask her if she wants a cast put on instead of a brace ?

## 2011-12-21 NOTE — Telephone Encounter (Signed)
Called patient, left message.

## 2011-12-21 NOTE — Telephone Encounter (Signed)
Patient aware and states she will just wait until scheduled appt

## 2011-12-25 ENCOUNTER — Other Ambulatory Visit: Payer: Self-pay | Admitting: Obstetrics and Gynecology

## 2011-12-25 ENCOUNTER — Other Ambulatory Visit (HOSPITAL_COMMUNITY)
Admission: RE | Admit: 2011-12-25 | Discharge: 2011-12-25 | Disposition: A | Discharge status: Home / Self Care | Payer: Medicare Other | Source: Ambulatory Visit | Attending: Obstetrics and Gynecology | Admitting: Obstetrics and Gynecology

## 2011-12-25 DIAGNOSIS — Z124 Encounter for screening for malignant neoplasm of cervix: Secondary | ICD-10-CM | POA: Insufficient documentation

## 2012-01-13 ENCOUNTER — Encounter: Payer: Self-pay | Admitting: Orthopedic Surgery

## 2012-01-13 ENCOUNTER — Ambulatory Visit (INDEPENDENT_AMBULATORY_CARE_PROVIDER_SITE_OTHER): Payer: Medicare Other | Admitting: Orthopedic Surgery

## 2012-01-13 DIAGNOSIS — S52509A Unspecified fracture of the lower end of unspecified radius, initial encounter for closed fracture: Secondary | ICD-10-CM

## 2012-01-13 DIAGNOSIS — M8580 Other specified disorders of bone density and structure, unspecified site: Secondary | ICD-10-CM

## 2012-01-13 DIAGNOSIS — M899 Disorder of bone, unspecified: Secondary | ICD-10-CM

## 2012-01-13 DIAGNOSIS — S62109A Fracture of unspecified carpal bone, unspecified wrist, initial encounter for closed fracture: Secondary | ICD-10-CM

## 2012-01-13 DIAGNOSIS — S52599A Other fractures of lower end of unspecified radius, initial encounter for closed fracture: Secondary | ICD-10-CM

## 2012-01-13 NOTE — Progress Notes (Signed)
Patient ID: Wanda [name], female   DOB: Mar 29, 1933, 78 y.o.   MRN: [id number] Chief Complaint  Patient presents with  . Follow-up    6 week recheck and xray right wrist     Followup RIGHT distal radius fracture  Patient was treated with a brace is the fracture was nondisplaced  The patient now complains of severe pain and swelling of her RIGHT hand with decreased range of motion and tightness.  She denies neurovascular complaints  Exam shows tenderness over the dorsum of the hand and wrist with swelling of the ring finger long finger and index finger and decreased range of motion including decreased opposition of the thumb to the small finger  Pulses normal color is normal.  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

## 2012-01-13 NOTE — Patient Instructions (Signed)
You have been scheduled for occupational therapy and functional hand /wrist split    and a CT scan of the hand

## 2012-01-15 ENCOUNTER — Telehealth: Payer: Self-pay | Admitting: Radiology

## 2012-01-15 NOTE — Telephone Encounter (Signed)
I called the patient to give her CT scan appointment at [facility] on 01-20-12 at 10:30. Patient has Medicare, no precert is needed. Patient will follow up here for her results.

## 2012-01-18 ENCOUNTER — Encounter (HOSPITAL_COMMUNITY): Payer: Self-pay | Admitting: Pharmacy Technician

## 2012-01-19 ENCOUNTER — Encounter (HOSPITAL_COMMUNITY)
Admission: RE | Admit: 2012-01-19 | Discharge: 2012-01-19 | Disposition: A | Discharge status: Home / Self Care | Payer: Medicare Other | Source: Ambulatory Visit | Attending: Obstetrics and Gynecology | Admitting: Obstetrics and Gynecology

## 2012-01-19 ENCOUNTER — Other Ambulatory Visit: Payer: Self-pay | Admitting: Obstetrics and Gynecology

## 2012-01-19 ENCOUNTER — Other Ambulatory Visit: Payer: Self-pay

## 2012-01-19 ENCOUNTER — Encounter (HOSPITAL_COMMUNITY): Payer: Self-pay

## 2012-01-19 DIAGNOSIS — Z5309 Procedure and treatment not carried out because of other contraindication: Secondary | ICD-10-CM | POA: Insufficient documentation

## 2012-01-19 DIAGNOSIS — N814 Uterovaginal prolapse, unspecified: Secondary | ICD-10-CM | POA: Insufficient documentation

## 2012-01-19 DIAGNOSIS — Z01812 Encounter for preprocedural laboratory examination: Secondary | ICD-10-CM | POA: Insufficient documentation

## 2012-01-19 DIAGNOSIS — D509 Iron deficiency anemia, unspecified: Secondary | ICD-10-CM | POA: Insufficient documentation

## 2012-01-19 HISTORY — DX: Sleep apnea, unspecified: G47.30

## 2012-01-19 LAB — FOLATE: Folate: >20 ng/mL

## 2012-01-19 LAB — URINE MICROSCOPIC-ADD ON

## 2012-01-19 LAB — CBC
HCT: 28.6 % — ABNORMAL LOW (ref 36.0–46.0)
Hemoglobin: 8.5 g/dL — ABNORMAL LOW (ref 12.0–15.0)
MCH: 26.6 pg (ref 26.0–34.0)
MCHC: 29.7 g/dL — ABNORMAL LOW (ref 30.0–36.0)
MCV: 89.4 fL (ref 78.0–100.0)
Platelets: 521 10*3/uL — ABNORMAL HIGH (ref 150–400)
RBC: 3.2 MIL/uL — ABNORMAL LOW (ref 3.87–5.11)
RDW: 13.7 % (ref 11.5–15.5)
WBC: 7.6 10*3/uL (ref 4.0–10.5)

## 2012-01-19 LAB — RETICULOCYTES
RBC.: 3.2 MIL/uL — ABNORMAL LOW (ref 3.87–5.11)
Retic Count, Absolute: 76.8 10*3/uL (ref 19.0–186.0)
Retic Ct Pct: 2.4 % (ref 0.4–3.1)

## 2012-01-19 LAB — URINALYSIS, ROUTINE W REFLEX MICROSCOPIC
Bilirubin Urine: NEGATIVE
Glucose, UA: NEGATIVE mg/dL
Ketones, ur: NEGATIVE mg/dL
Leukocytes, UA: NEGATIVE
Nitrite: NEGATIVE
Protein, ur: NEGATIVE mg/dL
Specific Gravity, Urine: 1.03 (ref 1.005–1.030)
Urobilinogen, UA: 0.2 mg/dL (ref 0.0–1.0)
pH: 5.5 (ref 5.0–8.0)

## 2012-01-19 LAB — COMPREHENSIVE METABOLIC PANEL
ALT: 14 U/L (ref 0–35)
AST: 20 U/L (ref 0–37)
Albumin: 3.3 g/dL — ABNORMAL LOW (ref 3.5–5.2)
Alkaline Phosphatase: 112 U/L (ref 39–117)
BUN: 21 mg/dL (ref 6–23)
CO2: 28 mEq/L (ref 19–32)
Calcium: 9.7 mg/dL (ref 8.4–10.5)
Chloride: 105 mEq/L (ref 96–112)
Creatinine, Ser: 0.66 mg/dL (ref 0.50–1.10)
GFR calc Af Amer: >90 mL/min (ref >90)
GFR calc non Af Amer: 84 mL/min — ABNORMAL LOW (ref >90)
Glucose, Bld: 86 mg/dL (ref 70–99)
Potassium: 4.8 mEq/L (ref 3.5–5.1)
Sodium: 141 mEq/L (ref 135–145)
Total Bilirubin: 0.2 mg/dL — ABNORMAL LOW (ref 0.3–1.2)
Total Protein: 7.2 g/dL (ref 6.0–8.3)

## 2012-01-19 LAB — IRON AND TIBC
Iron: 15 ug/dL — ABNORMAL LOW (ref 42–135)
Saturation Ratios: 3 % — ABNORMAL LOW (ref 20–55)
TIBC: 433 ug/dL (ref 250–470)
UIBC: 418 ug/dL — ABNORMAL HIGH (ref 125–400)

## 2012-01-19 LAB — SURGICAL PCR SCREEN
MRSA, PCR: NEGATIVE
Staphylococcus aureus: NEGATIVE

## 2012-01-19 LAB — VITAMIN B12: Vitamin B-12: 286 pg/mL (ref 211–911)

## 2012-01-19 LAB — FERRITIN: Ferritin: 9 ng/mL — ABNORMAL LOW (ref 10–291)

## 2012-01-19 MED ORDER — PEG 3350-KCL-NABCB-NACL-NASULF 236 G PO SOLR: 4000.0000 mL | Freq: Once | ORAL | Status: DC

## 2012-01-19 NOTE — Progress Notes (Signed)
01/19/12 0932  OBSTRUCTIVE SLEEP APNEA  Have you ever been diagnosed with sleep apnea through a sleep study? No  Do you snore loudly (loud enough to be heard through closed doors)?  1  Do you often feel tired, fatigued, or sleepy during the daytime? 1  Has anyone observed you stop breathing during your sleep? 0  Do you have, or are you being treated for high blood pressure? 1  BMI more than 35 kg/m2? 0  Age over 76 years old? 1  Neck circumference greater than 40 cm/18 inches? 0  Gender: 0  Obstructive Sleep Apnea Score 4   Score 4 or greater  Updated health history

## 2012-01-19 NOTE — Patient Instructions (Signed)
20 Wanda [name]  01/19/2012   Your procedure is scheduled on: 01/26/2012 at 8:30 am   Report to [facility]  @ 8:30 Call this number if you have problems the morning of surgery:  [phone]   Remember:   Do not eat food:After Midnight.  May have clear liquids: after 1200 noon the day prior to surgery.    Clear liquids include soda, tea, black coffee, apple or grape juice, broth.  Take these medicines the morning of surgery with A SIP OF WATER:  Lisinopril  Do not wear jewelry, make-up or nail polish.  Do not wear lotions, powders, or perfumes. You may wear deodorant.  Do not shave 48 hours prior to surgery.  Do not bring valuables to the hospital.  Contacts, dentures or bridgework may not be worn into surgery.  Leave suitcase in the car. After surgery it may be brought to your room.  For patients admitted to the hospital, checkout time is 11:00 AM the day of discharge.   Patients discharged the day of surgery will not be allowed to drive home.  Name and phone number of your driver: Kenlee Vogt [phone] Special Instructions: CHG Shower Use Special Wash: 1/2 bottle night before surgery and 1/2 bottle morning of surgery.   Please read over the following fact sheets that you were given: Care and Recovery After Surgery

## 2012-01-19 NOTE — Progress Notes (Addendum)
Hgb 8.5  and Hct 28.6  called to Crystal at Dr Rayna Sexton office, She sts she will give results to Dr Emelda Fear.

## 2012-01-20 ENCOUNTER — Telehealth: Payer: Self-pay | Admitting: Orthopedic Surgery

## 2012-01-20 ENCOUNTER — Ambulatory Visit (HOSPITAL_COMMUNITY): Admission: RE | Admit: 2012-01-20 | Payer: Medicare Other | Source: Ambulatory Visit

## 2012-01-20 LAB — ABO/RH: ABO/RH(D): O POS

## 2012-01-20 LAB — PREPARE RBC (CROSSMATCH)

## 2012-01-20 NOTE — Progress Notes (Signed)
Dr Emelda Fear notified and orders entered for cross match. Dr Emelda Fear states he will call patient and have transfusion set up.

## 2012-01-20 NOTE — Progress Notes (Signed)
Hgb-8.5, Hct 28.6, shown to Dr Jayme Cloud. Wants patient transfused X1 unit and then typed and crossed for 2 additional units prior to surgery. Will notify Dr Emelda Fear.

## 2012-01-20 NOTE — Telephone Encounter (Signed)
Patient called to relay that Wanda [name] called her and has re-scheduled the CT appointment to tomorrow, 01/21/12 at 5:00pm, due to CT not available today.  She has her follow-up appointment scheduled for 01/25/12.

## 2012-01-21 ENCOUNTER — Ambulatory Visit (HOSPITAL_COMMUNITY)
Admission: RE | Admit: 2012-01-21 | Discharge: 2012-01-21 | Disposition: A | Discharge status: Home / Self Care | Payer: Medicare Other | Source: Ambulatory Visit | Attending: Orthopedic Surgery | Admitting: Orthopedic Surgery

## 2012-01-21 ENCOUNTER — Encounter (HOSPITAL_COMMUNITY): Payer: Self-pay

## 2012-01-21 DIAGNOSIS — M79609 Pain in unspecified limb: Secondary | ICD-10-CM | POA: Insufficient documentation

## 2012-01-21 DIAGNOSIS — M8580 Other specified disorders of bone density and structure, unspecified site: Secondary | ICD-10-CM

## 2012-01-21 DIAGNOSIS — S62109A Fracture of unspecified carpal bone, unspecified wrist, initial encounter for closed fracture: Secondary | ICD-10-CM

## 2012-01-21 DIAGNOSIS — M818 Other osteoporosis without current pathological fracture: Secondary | ICD-10-CM | POA: Insufficient documentation

## 2012-01-21 LAB — PREPARE RBC (CROSSMATCH)

## 2012-01-23 LAB — TYPE AND SCREEN
ABO/RH(D): O POS
Antibody Screen: NEGATIVE
Unit division: 0
Unit division: 0
Unit division: 0

## 2012-01-25 ENCOUNTER — Encounter: Payer: Self-pay | Admitting: Orthopedic Surgery

## 2012-01-25 ENCOUNTER — Ambulatory Visit (INDEPENDENT_AMBULATORY_CARE_PROVIDER_SITE_OTHER): Payer: Medicare Other | Admitting: Orthopedic Surgery

## 2012-01-25 VITALS — BP 130/60 | Ht 60.0 in | Wt 132.0 lb

## 2012-01-25 DIAGNOSIS — M899 Disorder of bone, unspecified: Secondary | ICD-10-CM

## 2012-01-25 DIAGNOSIS — M8580 Other specified disorders of bone density and structure, unspecified site: Secondary | ICD-10-CM

## 2012-01-25 NOTE — Progress Notes (Signed)
Patient ID: Wanda [name], female   DOB: January 10, 1933, 78 y.o.   MRN: [id number] Chief Complaint  Patient presents with  . Results    ct result review   History the patient fractured her RIGHT wrist had a rather benign fracture but post injury and treatment developed disuse osteopenia there was concern of a fracture  CT Scan was done showed no fracture just disuse osteopenia.  She was sent for occupational therapy and has improved significantly.  Exam shows that her pain is much less her swelling is down in the hand and wrist her function is much better.  She can flex her MP joints down to 90 her wrist extension is approximately 25.  She has no neurovascular deficit  Plan          The splint order can be canceled but she should continue therapy.  She will wear the wrist splint that we gave her as needed and follow up in one

## 2012-01-25 NOTE — Patient Instructions (Signed)
To the therapist  -ok to cancel splint order    Continue therapy on the hand

## 2012-01-25 NOTE — OR Nursing (Signed)
Procedure cancelled and will be rescheduled. Cancelled due to Anemia  And + Hemoccult by Dr. Emelda Fear

## 2012-01-26 ENCOUNTER — Inpatient Hospital Stay (HOSPITAL_COMMUNITY)
Admission: RE | Admit: 2012-01-26 | Payer: Medicare Other | Source: Ambulatory Visit | Admitting: Obstetrics and Gynecology

## 2012-01-26 ENCOUNTER — Encounter (HOSPITAL_COMMUNITY): Admission: RE | Payer: Self-pay | Source: Ambulatory Visit

## 2012-01-26 SURGERY — HYSTERECTOMY, VAGINAL
Anesthesia: General

## 2012-02-02 ENCOUNTER — Ambulatory Visit (INDEPENDENT_AMBULATORY_CARE_PROVIDER_SITE_OTHER): Payer: Medicare Other | Admitting: Urgent Care

## 2012-02-02 ENCOUNTER — Encounter: Payer: Self-pay | Admitting: Urgent Care

## 2012-02-02 VITALS — BP 146/77 | HR 78 | Temp 98.0°F | Ht 60.0 in | Wt 135.6 lb

## 2012-02-02 DIAGNOSIS — R195 Other fecal abnormalities: Secondary | ICD-10-CM

## 2012-02-02 DIAGNOSIS — K219 Gastro-esophageal reflux disease without esophagitis: Secondary | ICD-10-CM

## 2012-02-02 DIAGNOSIS — D509 Iron deficiency anemia, unspecified: Secondary | ICD-10-CM | POA: Insufficient documentation

## 2012-02-02 NOTE — Assessment & Plan Note (Signed)
See IDA.  

## 2012-02-02 NOTE — Patient Instructions (Signed)
If you have going to take ibuprofen, you should take Prilosec 20 mg daily to protect your stomach You are going to need further evaluation to determine cause of the blood found in your stool. I will be discussing this further with Dr. Jena Gauss and we will proceed with evaluation of your GI tract. Please call me by the end of the week, if I've not called you. If you notice blood in her stool or black tarry stools, you should call us right away.  Iron Deficiency Anemia There are many types of anemia. Iron deficiency anemia is the most common. Iron deficiency anemia is a decrease in the number of red blood cells caused by too little iron. Without enough iron, your body does not produce enough hemoglobin. Hemoglobin is a substance in red blood cells that carries oxygen to the body's tissues. Iron deficiency anemia may leave you tired and short of breath. CAUSES   Lack of iron in the diet.   This may be seen in infants and children, because there is little iron in milk.   This may be seen in adults who do not eat enough iron-rich foods.   This may be seen in pregnant or breastfeeding women who do not take iron supplements. There is a much higher need for iron intake at these times.   Poor absorption of iron, as seen with intestinal disorders.   Intestinal bleeding.   Heavy periods.  SYMPTOMS  Mild anemia may not be noticeable. Symptoms may include:  Fatigue.   Headache.   Pale skin.   Weakness.   Shortness of breath.   Dizziness.   Cold hands and feet.   Fast or irregular heartbeat.  DIAGNOSIS  Diagnosis requires a thorough evaluation and physical exam by your caregiver.  Blood tests are generally used to confirm iron deficiency anemia.   Additional tests may be done to find the underlying cause of your anemia. These may include:   Testing for blood in the stool (fecal occult blood test).   A procedure to see inside the colon and rectum (colonoscopy).   A procedure to see  inside the esophagus and stomach (endoscopy).  TREATMENT   Correcting the cause of the iron deficiency is the first step.   Medicines, such as oral contraceptives, can make heavy menstrual flows lighter.   Antibiotics and other medicines can be used to treat peptic ulcers.   Surgery may be needed to remove a bleeding polyp, tumor, or fibroid.   Often, iron supplements (ferrous sulfate) are taken.   For the best iron absorption, take these supplements with an empty stomach.   You may need to take the supplements with food if you cannot tolerate them on an empty stomach. Vitamin C improves the absorption of iron. Your caregiver may recommend taking your iron tablets with a glass of orange juice or vitamin C supplement.   Milk and antacids should not be taken at the same time as iron supplements. They may interfere with the absorption of iron.   Iron supplements can cause constipation. A stool softener is often recommended.   Pregnant and breastfeeding women will need to take extra iron, because their normal diet usually will not provide the required amount.   Patients who cannot tolerate iron by mouth can take it through a vein (intravenously) or by an injection into the muscle.  HOME CARE INSTRUCTIONS   Ask your dietitian for help with diet questions.   Take iron and vitamins as directed by your caregiver.  Eat a diet rich in iron. Eat liver, lean beef, whole-grain bread, eggs, dried fruit, and dark green leafy vegetables.  SEEK IMMEDIATE MEDICAL CARE IF:   You have a fainting episode. Do not drive yourself. Call your local emergency services (911 in [place] if no other help is available.   You have chest pain, nausea, or vomiting.   You develop severe or increased shortness of breath with activities.   You develop weakness or increased thirst.   You have a rapid heartbeat.   You develop unexplained sweating or become lightheaded when getting up from a chair or bed.  MAKE  SURE YOU:   Understand these instructions.   Will watch your condition.   Will get help right away if you are not doing well or get worse.  Document Released: 10/30/2000 Document Revised: 10/22/2011 Document Reviewed: 03/11/2010 [facility] Patient Information 2012 [place], [place].

## 2012-02-02 NOTE — Progress Notes (Signed)
Faxed to PCP

## 2012-02-02 NOTE — Assessment & Plan Note (Addendum)
Wanda [name] is a pleasant 78 y.o. female with iron deficiency anemia with a ferritin of 9.  She was found to be Hemoccult positive. She denies any GI complaints other than rare heartburn. She has history of chronic non-H. pylori gastritis and is on NSAIDs, no longer on PPI.  She is going to need further evaluation to determine etiology of her GI bleeding.  Her last colonoscopy was within the past 15 months. She had good prep, however she had dense diverticula on the left.  Will review colonoscopy findings with Dr. Jena Gauss.  She is definitely going to need EGD and possible givens capsule study to complete her GI workup.  I have discussed risks & benefits which include, but are not limited to, bleeding, infection, perforation & drug reaction.  The patient agrees with this plan & written consent will be obtained.    I discussed Wanda [name]'s previous work up & current IDA w/ Dr Jena Gauss. He would like to check serum H. Pylori &  He would like to offer Wanda [name] another colonoscopy, +/- EGD +/- GIVENS capsule placement RE:IDA/hemoccult positive Hold iron for 1 week prior to procedure We already discussed risks/options at office visit. Please arrange and let me know if she has any further questions.

## 2012-02-02 NOTE — Assessment & Plan Note (Signed)
She was asked to resume omeprazole 20 mg daily, especially she is quite to take NSAIDs.

## 2012-02-02 NOTE — Progress Notes (Signed)
Referring Provider: Dr. Christin Bach Primary Care Physician:  Ninfa Linden, NP  Chief Complaint  Patient presents with  . Anemia    Hemoccult-positive stool   HPI:  Wanda [name] is a 78 y.o. female referred back to [place] from Dr. Christin Bach for iron deficiency anemia. She had a previous workup for anemia by Dr. Jena Gauss last year including colonoscopy and EGD which showed non--H. pylori gastritis and diverticulosis. Her anemia improved and she was found to be Hemoccult negative.  Since that time she has done well. She went to see Dr. Emelda Fear and was planning on having surgery for uterine prolapse with hysterectomy.  Upon preop labs she was found to have a hemoglobin of 8.5 and hematocrit 28.6. Anemia panel shows a ferritin of 9, iron 15, percent saturation 3, TIBC 418, TIBC 433, RBC 3.2, folate normal, and B12 286 normal. On evaluation last year she had a normal ferritin.  She was also found to be Hemoccult positive at her office visit.  She recently sustained a right radial wrist fx that has been treated conservatively.  She has been having pain and inflammation and has been taking ibuprofen 600mg  per day about 3 days per week.  She stopped aspirin 81mg  daily about 6 mo ago due to postprandial diarrhea.  She also stopped prilosec a couple months ago as well because she felt this contributed to her diarrhea.   She rarely takes prn acid reducer pill or prilosec for heartburn.  Denies ongoing heartburn or indigestion, dysphagia or odynophaghia.  Quit drinking cokes.  Denies gross rectal bleeding or melena.  Wt stable.  Appetite ok.  Denies diarrhea.  C/o constipation w/ hydrocodone for wrist fracture that has resolved since she has limited use.    Past Medical History  Diagnosis Date  . IDA (iron deficiency anemia)   . GERD (gastroesophageal reflux disease)   . Gastritis 12/08/10    EGD by Dr. Nancy Fetter hiatal hernia, duodenal diverticulum, chronic gastritis  . Diverticulosis 10/20/10   Left-sided on colonoscopy by Dr. Jena Gauss 10/20/10  . HTN (hypertension)   . Vitamin d deficiency   . Osteoarthritis   . Heart murmur   . Sleep apnea     STOP BANG SCORE 4  . Cystocele   . Urge incontinence   . Radial fracture     (right) Undergoing treatment by Dr. Romeo Apple currently    No past surgical history on file.  Current Outpatient Prescriptions  Medication Sig Dispense Refill  . cholecalciferol (VITAMIN D) 1000 UNITS tablet Take 1,000 Units by mouth 3 (three) times daily.      Marland Kitchen DOCUSATE SODIUM PO Take 1 capsule by mouth daily.      Marland Kitchen HYDROcodone-acetaminophen (NORCO) 5-325 MG per tablet Take 1 tablet by mouth every 6 (six) hours as needed. For severe pain      . ibuprofen (ADVIL,MOTRIN) 200 MG tablet Take 600 mg by mouth every 6 (six) hours as needed. For pain      . IRON COMBINATIONS PO Take 1 tablet by mouth daily.      Marland Kitchen lisinopril (PRINIVIL,ZESTRIL) 20 MG tablet Take 20 mg by mouth daily.          Allergies as of 02/02/2012 - Review Complete 02/02/2012  Allergen Reaction Noted  . Naproxen sodium Rash and Other (See Comments)    Family history: There is no known family history of colorectal carcinoma, liver disease, or inflammatory bowel disease.  Review of Systems: Gen: She has had malaise & fatigue.  Denies any fever, chills, sweats, anorexia, weakness, weight loss, and sleep disorder. CV: Denies chest pain, angina, palpitations, syncope, orthopnea, PND, peripheral edema, and claudication. Resp: Denies dyspnea at rest, dyspnea with exercise, cough, sputum, wheezing, coughing up blood, and pleurisy. GI: Denies vomiting blood, jaundice, and fecal incontinence.   Denies dysphagia or odynophagia. GU : Denies urinary burning, blood in urine, urinary frequency, urinary hesitancy, nocturnal urination, and urinary incontinence. MS: Denies joint pain, limitation of movement, and swelling, stiffness, low back pain, extremity pain. Denies muscle weakness, cramps, atrophy.    Derm: Denies rash, itching, dry skin, hives, moles, warts, or unhealing ulcers.  Psych: Denies depression, anxiety, memory loss, suicidal ideation, hallucinations, paranoia, and confusion. Heme: Denies bruising, bleeding, and enlarged lymph nodes. Neuro:  Denies any headaches, dizziness, paresthesias. Endo:  Denies any problems with DM, thyroid, adrenal function.  Physical Exam: BP 146/77  Pulse 78  Temp(Src) 98 F (36.7 C) (Temporal)  Ht 5' (1.524 m)  Wt 135 lb 9.6 oz (61.508 kg)  BMI 26.48 kg/m2 General:   Alert,  Well-developed, well-nourished, pleasant and cooperative in NAD. Head:  Normocephalic and atraumatic. Eyes:  Sclera clear, no icterus.   Conjunctiva pink. Mouth:  No deformity or lesions, dentition normal. Neck:  Supple; no masses or thyromegaly. Heart:  Regular rate and rhythm; no murmurs, clicks, rubs,  or gallops. Abdomen:  Soft, nontender and nondistended. No masses, hepatosplenomegaly or hernias noted. Normal bowel sounds, without guarding, and without rebound.   Msk:  Symmetrical without gross deformities. Normal posture. Pulses:  Normal pulses noted. Extremities:  Without clubbing or edema. Neurologic:  Alert and  oriented x4;  grossly normal neurologically. Skin:  Intact without significant lesions or rashes. Cervical Nodes:  No significant cervical adenopathy. Psych:  Alert and cooperative. Normal mood and affect.

## 2012-02-03 ENCOUNTER — Telehealth: Payer: Self-pay | Admitting: Urgent Care

## 2012-02-03 NOTE — Telephone Encounter (Signed)
Please call patient I discussed Wanda [name]'s previous work up & current IDA w/ Dr Jena Gauss.  He would like to check serum H. Pylori &  He would like to offer Wanda [name] another colonoscopy, +/- EGD +/- GIVENS capsule placement RE:IDA/hemoccult positive  Hold iron for 1 week prior to procedure  We already discussed risks/options at office visit.  Please arrange and let me know if she has any further questions. Thanks

## 2012-02-04 ENCOUNTER — Other Ambulatory Visit: Payer: Self-pay | Admitting: Urgent Care

## 2012-02-04 DIAGNOSIS — D649 Anemia, unspecified: Secondary | ICD-10-CM

## 2012-02-04 NOTE — Telephone Encounter (Signed)
LMOVM for pt to call back and schedule procedure

## 2012-02-04 NOTE — Telephone Encounter (Signed)
Pt aware, lab order faxed to lab. She said she would go by today and have it done.   Crystal, she said its ok to set up procedures.

## 2012-02-05 ENCOUNTER — Other Ambulatory Visit: Payer: Self-pay | Admitting: Gastroenterology

## 2012-02-05 DIAGNOSIS — D509 Iron deficiency anemia, unspecified: Secondary | ICD-10-CM

## 2012-02-05 LAB — H. PYLORI ANTIBODY, IGG: H Pylori IgG: 0.43 {ISR}

## 2012-02-05 MED ORDER — PEG 3350-KCL-NA BICARB-NACL 420 G PO SOLR: ORAL | Status: DC

## 2012-02-05 MED ORDER — PEG 3350-KCL-NA BICARB-NACL 420 G PO SOLR: ORAL | Status: AC

## 2012-02-05 NOTE — Telephone Encounter (Signed)
Pt is  Scheduled for 04/15 - instructions mailed

## 2012-02-08 NOTE — Progress Notes (Signed)
Quick Note:  Please let pt know results. Keep procedures as planned. Cc: Ninfa Linden, NP, NP  ______

## 2012-02-26 MED ORDER — SODIUM CHLORIDE 0.45 % IV SOLN
Freq: Once | INTRAVENOUS | Status: AC
  Administered 2012-02-29: 14:00:00 via INTRAVENOUS

## 2012-02-29 ENCOUNTER — Ambulatory Visit (HOSPITAL_COMMUNITY)
Admission: RE | Admit: 2012-02-29 | Discharge: 2012-02-29 | Disposition: A | Discharge status: Home / Self Care | Payer: Medicare Other | Source: Ambulatory Visit | Attending: Internal Medicine | Admitting: Internal Medicine

## 2012-02-29 ENCOUNTER — Encounter (HOSPITAL_COMMUNITY)
Admission: RE | Disposition: A | Discharge status: Home / Self Care | Payer: Self-pay | Source: Ambulatory Visit | Attending: Internal Medicine

## 2012-02-29 ENCOUNTER — Encounter (HOSPITAL_COMMUNITY): Payer: Self-pay

## 2012-02-29 DIAGNOSIS — K449 Diaphragmatic hernia without obstruction or gangrene: Secondary | ICD-10-CM | POA: Insufficient documentation

## 2012-02-29 DIAGNOSIS — D126 Benign neoplasm of colon, unspecified: Secondary | ICD-10-CM | POA: Insufficient documentation

## 2012-02-29 DIAGNOSIS — G4733 Obstructive sleep apnea (adult) (pediatric): Secondary | ICD-10-CM | POA: Insufficient documentation

## 2012-02-29 DIAGNOSIS — I10 Essential (primary) hypertension: Secondary | ICD-10-CM | POA: Insufficient documentation

## 2012-02-29 DIAGNOSIS — R195 Other fecal abnormalities: Secondary | ICD-10-CM

## 2012-02-29 DIAGNOSIS — K5730 Diverticulosis of large intestine without perforation or abscess without bleeding: Secondary | ICD-10-CM

## 2012-02-29 DIAGNOSIS — D509 Iron deficiency anemia, unspecified: Secondary | ICD-10-CM

## 2012-02-29 DIAGNOSIS — Z79899 Other long term (current) drug therapy: Secondary | ICD-10-CM | POA: Insufficient documentation

## 2012-02-29 DIAGNOSIS — K921 Melena: Secondary | ICD-10-CM | POA: Insufficient documentation

## 2012-02-29 DIAGNOSIS — D131 Benign neoplasm of stomach: Secondary | ICD-10-CM

## 2012-02-29 HISTORY — PX: GIVENS CAPSULE STUDY: SHX5432

## 2012-02-29 HISTORY — PX: ESOPHAGOGASTRODUODENOSCOPY: SHX5428

## 2012-02-29 HISTORY — PX: COLONOSCOPY: SHX5424

## 2012-02-29 SURGERY — COLONOSCOPY
Anesthesia: Moderate Sedation

## 2012-02-29 MED ORDER — MEPERIDINE HCL 100 MG/ML IJ SOLN
INTRAMUSCULAR | Status: DC | PRN
  Administered 2012-02-29: 25 mg via INTRAVENOUS
  Administered 2012-02-29: 50 mg via INTRAVENOUS

## 2012-02-29 MED ORDER — MEPERIDINE HCL 100 MG/ML IJ SOLN
INTRAMUSCULAR | Status: AC
  Filled 2012-02-29: qty 2

## 2012-02-29 MED ORDER — MIDAZOLAM HCL 5 MG/5ML IJ SOLN
INTRAMUSCULAR | Status: AC
  Filled 2012-02-29: qty 10

## 2012-02-29 MED ORDER — STERILE WATER FOR IRRIGATION IR SOLN
Status: DC | PRN
  Administered 2012-02-29: 14:00:00

## 2012-02-29 MED ORDER — BUTAMBEN-TETRACAINE-BENZOCAINE 2-2-14 % EX AERO
INHALATION_SPRAY | CUTANEOUS | Status: DC | PRN
  Administered 2012-02-29: 2 via TOPICAL

## 2012-02-29 MED ORDER — MIDAZOLAM HCL 5 MG/5ML IJ SOLN
INTRAMUSCULAR | Status: DC | PRN
  Administered 2012-02-29: 1 mg via INTRAVENOUS
  Administered 2012-02-29: 2 mg via INTRAVENOUS
  Administered 2012-02-29 (×2): 1 mg via INTRAVENOUS

## 2012-02-29 NOTE — Discharge Instructions (Addendum)
EGD Discharge instructions Please read the instructions outlined below and refer to this sheet in the next few weeks. These discharge instructions provide you with general information on caring for yourself after you leave the hospital. Your doctor may also give you specific instructions. While your treatment has been planned according to the most current medical practices available, unavoidable complications occasionally occur. If you have any problems or questions after discharge, please call your doctor. ACTIVITY  You may resume your regular activity but move at a slower pace for the next 24 hours.   Take frequent rest periods for the next 24 hours.   Walking will help expel (get rid of) the air and reduce the bloated feeling in your abdomen.   No driving for 24 hours (because of the anesthesia (medicine) used during the test).   You may shower.   Do not sign any important legal documents or operate any machinery for 24 hours (because of the anesthesia used during the test).  NUTRITION  Drink plenty of fluids.   You may resume your normal diet.   Begin with a light meal and progress to your normal diet.   Avoid alcoholic beverages for 24 hours or as instructed by your caregiver.  MEDICATIONS  You may resume your normal medications unless your caregiver tells you otherwise.  WHAT YOU CAN EXPECT TODAY  You may experience abdominal discomfort such as a feeling of fullness or "gas" pains.  FOLLOW-UP  Your doctor will discuss the results of your test with you.  SEEK IMMEDIATE MEDICAL ATTENTION IF ANY OF THE FOLLOWING OCCUR:  Excessive nausea (feeling sick to your stomach) and/or vomiting.   Severe abdominal pain and distention (swelling).   Trouble swallowing.   Temperature over 101 F (37.8 C).   Rectal bleeding or vomiting of blood.    Colonoscopy Discharge Instructions  Read the instructions outlined below and refer to this sheet in the next few weeks. These  discharge instructions provide you with general information on caring for yourself after you leave the hospital. Your doctor may also give you specific instructions. While your treatment has been planned according to the most current medical practices available, unavoidable complications occasionally occur. If you have any problems or questions after discharge, call Dr. Gala Romney at [phone]. ACTIVITY  You may resume your regular activity, but move at a slower pace for the next 24 hours.   Take frequent rest periods for the next 24 hours.   Walking will help get rid of the air and reduce the bloated feeling in your belly (abdomen).   No driving for 24 hours (because of the medicine (anesthesia) used during the test).    Do not sign any important legal documents or operate any machinery for 24 hours (because of the anesthesia used during the test).  NUTRITION  Drink plenty of fluids.   You may resume your normal diet as instructed by your doctor.   Begin with a light meal and progress to your normal diet. Heavy or fried foods are harder to digest and may make you feel sick to your stomach (nauseated).   Avoid alcoholic beverages for 24 hours or as instructed.  MEDICATIONS  You may resume your normal medications unless your doctor tells you otherwise.  WHAT YOU CAN EXPECT TODAY  Some feelings of bloating in the abdomen.   Passage of more gas than usual.   Spotting of blood in your stool or on the toilet paper.  IF YOU HAD POLYPS REMOVED DURING THE  COLONOSCOPY:  No aspirin products for 7 days or as instructed.   No alcohol for 7 days or as instructed.   Eat a soft diet for the next 24 hours.  FINDING OUT THE RESULTS OF YOUR TEST Not all test results are available during your visit. If your test results are not back during the visit, make an appointment with your caregiver to find out the results. Do not assume everything is normal if you have not heard from your caregiver or the  medical facility. It is important for you to follow up on all of your test results.  SEEK IMMEDIATE MEDICAL ATTENTION IF:  You have more than a spotting of blood in your stool.   Your belly is swollen (abdominal distention).   You are nauseated or vomiting.   You have a temperature over 101.   You have abdominal pain or discomfort that is severe or gets worse throughout the day.    Diverticulosis and polyp information provided.  Follow diet-provided regarding video capsule of small intestine  Further recommendations to follow.  Colon Polyps A polyp is extra tissue that grows inside your body. Colon polyps grow in the large intestine. The large intestine, also called the colon, is part of your digestive system. It is a long, hollow tube at the end of your digestive tract where your body makes and stores stool. Most polyps are not dangerous. They are benign. This means they are not cancerous. But over time, some types of polyps can turn into cancer. Polyps that are smaller than a pea are usually not harmful. But larger polyps could someday become or may already be cancerous. To be safe, doctors remove all polyps and test them.  WHO GETS POLYPS? Anyone can get polyps, but certain people are more likely than others. You may have a greater chance of getting polyps if:  You are over 50.   You have had polyps before.   Someone in your family has had polyps.   Someone in your family has had cancer of the large intestine.   Find out if someone in your family has had polyps. You may also be more likely to get polyps if you:   Eat a lot of fatty foods.   Smoke.   Drink alcohol.   Do not exercise.   Eat too much.  SYMPTOMS  Most small polyps do not cause symptoms. People often do not know they have one until their caregiver finds it during a regular checkup or while testing them for something else. Some people do have symptoms like these:  Bleeding from the anus. You might notice  blood on your underwear or on toilet paper after you have had a bowel movement.   Constipation or diarrhea that lasts more than a week.   Blood in the stool. Blood can make stool look black or it can show up as red streaks in the stool.  If you have any of these symptoms, see your caregiver. HOW DOES THE DOCTOR TEST FOR POLYPS? The doctor can use four tests to check for polyps:  Digital rectal exam. The caregiver wears gloves and checks your rectum (the last part of the large intestine) to see if it feels normal. This test would find polyps only in the rectum. Your caregiver may need to do one of the other tests listed below to find polyps higher up in the intestine.   Barium enema. The caregiver puts a liquid called barium into your rectum before taking x-rays  of your large intestine. Barium makes your intestine look white in the pictures. Polyps are dark, so they are easy to see.   Sigmoidoscopy. With this test, the caregiver can see inside your large intestine. A thin flexible tube is placed into your rectum. The device is called a sigmoidoscope, which has a light and a tiny video camera in it. The caregiver uses the sigmoidoscope to look at the last third of your large intestine.   Colonoscopy. This test is like sigmoidoscopy, but the caregiver looks at all of the large intestine. It usually requires sedation. This is the most common method for finding and removing polyps.  TREATMENT   The caregiver will remove the polyp during sigmoidoscopy or colonoscopy. The polyp is then tested for cancer.   If you have had polyps, your caregiver may want you to get tested regularly in the future.  PREVENTION  There is not one sure way to prevent polyps. You might be able to lower your risk of getting them if you:  Eat more fruits and vegetables and less fatty food.   Do not smoke.   Avoid alcohol.   Exercise every day.   Lose weight if you are overweight.   Eating more calcium and folate  can also lower your risk of getting polyps. Some foods that are rich in calcium are milk, cheese, and broccoli. Some foods that are rich in folate are chickpeas, kidney beans, and spinach.   Aspirin might help prevent polyps. Studies are under way.  Document Released: 07/29/2004 Document Revised: 10/22/2011 Document Reviewed: 01/04/2008 [facility] Patient Information 2012 [place], [place].   Diverticulosis Diverticulosis is a common condition that develops when small pouches (diverticula) form in the wall of the colon. The risk of diverticulosis increases with age. It happens more often in people who eat a low-fiber diet. Most individuals with diverticulosis have no symptoms. Those individuals with symptoms usually experience abdominal pain, constipation, or loose stools (diarrhea). HOME CARE INSTRUCTIONS   Increase the amount of fiber in your diet as directed by your caregiver or dietician. This may reduce symptoms of diverticulosis.   Your caregiver may recommend taking a dietary fiber supplement.   Drink at least 6 to 8 glasses of water each day to prevent constipation.   Try not to strain when you have a bowel movement.   Your caregiver may recommend avoiding nuts and seeds to prevent complications, although this is still an uncertain benefit.   Only take over-the-counter or prescription medicines for pain, discomfort, or fever as directed by your caregiver.  FOODS WITH HIGH FIBER CONTENT INCLUDE:  Fruits. Apple, peach, pear, tangerine, raisins, prunes.   Vegetables. Brussels sprouts, asparagus, broccoli, cabbage, carrot, cauliflower, romaine lettuce, spinach, summer squash, tomato, winter squash, zucchini.   Starchy Vegetables. Baked beans, kidney beans, lima beans, split peas, lentils, potatoes (with skin).   Grains. Whole wheat bread, brown rice, bran flake cereal, plain oatmeal, white rice, shredded wheat, bran muffins.  SEEK IMMEDIATE MEDICAL CARE IF:   You develop increasing  pain or severe bloating.   You have an oral temperature above 102 F (38.9 C), not controlled by medicine.   You develop vomiting or bowel movements that are bloody or black.  Document Released: 07/30/2004 Document Revised: 10/22/2011 Document Reviewed: 04/02/2010 [facility] Patient Information 2012 [place], [place].

## 2012-02-29 NOTE — H&P (View-Only) (Signed)
Referring Provider: Dr. Christin Bach Primary Care Physician:  Ninfa Linden, NP  Chief Complaint  Patient presents with  . Anemia    Hemoccult-positive stool   HPI:  Wanda [name] is a 78 y.o. female referred back to [place] from Dr. Christin Bach for iron deficiency anemia. She had a previous workup for anemia by Dr. Jena Gauss last year including colonoscopy and EGD which showed non--H. pylori gastritis and diverticulosis. Her anemia improved and she was found to be Hemoccult negative.  Since that time she has done well. She went to see Dr. Emelda Fear and was planning on having surgery for uterine prolapse with hysterectomy.  Upon preop labs she was found to have a hemoglobin of 8.5 and hematocrit 28.6. Anemia panel shows a ferritin of 9, iron 15, percent saturation 3, TIBC 418, TIBC 433, RBC 3.2, folate normal, and B12 286 normal. On evaluation last year she had a normal ferritin.  She was also found to be Hemoccult positive at her office visit.  She recently sustained a right radial wrist fx that has been treated conservatively.  She has been having pain and inflammation and has been taking ibuprofen 600mg  per day about 3 days per week.  She stopped aspirin 81mg  daily about 6 mo ago due to postprandial diarrhea.  She also stopped prilosec a couple months ago as well because she felt this contributed to her diarrhea.   She rarely takes prn acid reducer pill or prilosec for heartburn.  Denies ongoing heartburn or indigestion, dysphagia or odynophaghia.  Quit drinking cokes.  Denies gross rectal bleeding or melena.  Wt stable.  Appetite ok.  Denies diarrhea.  C/o constipation w/ hydrocodone for wrist fracture that has resolved since she has limited use.    Past Medical History  Diagnosis Date  . IDA (iron deficiency anemia)   . GERD (gastroesophageal reflux disease)   . Gastritis 12/08/10    EGD by Dr. Nancy Fetter hiatal hernia, duodenal diverticulum, chronic gastritis  . Diverticulosis 10/20/10   Left-sided on colonoscopy by Dr. Jena Gauss 10/20/10  . HTN (hypertension)   . Vitamin d deficiency   . Osteoarthritis   . Heart murmur   . Sleep apnea     STOP BANG SCORE 4  . Cystocele   . Urge incontinence   . Radial fracture     (right) Undergoing treatment by Dr. Romeo Apple currently    No past surgical history on file.  Current Outpatient Prescriptions  Medication Sig Dispense Refill  . cholecalciferol (VITAMIN D) 1000 UNITS tablet Take 1,000 Units by mouth 3 (three) times daily.      Marland Kitchen DOCUSATE SODIUM PO Take 1 capsule by mouth daily.      Marland Kitchen HYDROcodone-acetaminophen (NORCO) 5-325 MG per tablet Take 1 tablet by mouth every 6 (six) hours as needed. For severe pain      . ibuprofen (ADVIL,MOTRIN) 200 MG tablet Take 600 mg by mouth every 6 (six) hours as needed. For pain      . IRON COMBINATIONS PO Take 1 tablet by mouth daily.      Marland Kitchen lisinopril (PRINIVIL,ZESTRIL) 20 MG tablet Take 20 mg by mouth daily.          Allergies as of 02/02/2012 - Review Complete 02/02/2012  Allergen Reaction Noted  . Naproxen sodium Rash and Other (See Comments)    Family history: There is no known family history of colorectal carcinoma, liver disease, or inflammatory bowel disease.  Review of Systems: Gen: She has had malaise & fatigue.  Denies any fever, chills, sweats, anorexia, weakness, weight loss, and sleep disorder. CV: Denies chest pain, angina, palpitations, syncope, orthopnea, PND, peripheral edema, and claudication. Resp: Denies dyspnea at rest, dyspnea with exercise, cough, sputum, wheezing, coughing up blood, and pleurisy. GI: Denies vomiting blood, jaundice, and fecal incontinence.   Denies dysphagia or odynophagia. GU : Denies urinary burning, blood in urine, urinary frequency, urinary hesitancy, nocturnal urination, and urinary incontinence. MS: Denies joint pain, limitation of movement, and swelling, stiffness, low back pain, extremity pain. Denies muscle weakness, cramps, atrophy.    Derm: Denies rash, itching, dry skin, hives, moles, warts, or unhealing ulcers.  Psych: Denies depression, anxiety, memory loss, suicidal ideation, hallucinations, paranoia, and confusion. Heme: Denies bruising, bleeding, and enlarged lymph nodes. Neuro:  Denies any headaches, dizziness, paresthesias. Endo:  Denies any problems with DM, thyroid, adrenal function.  Physical Exam: BP 146/77  Pulse 78  Temp(Src) 98 F (36.7 C) (Temporal)  Ht 5' (1.524 m)  Wt 135 lb 9.6 oz (61.508 kg)  BMI 26.48 kg/m2 General:   Alert,  Well-developed, well-nourished, pleasant and cooperative in NAD. Head:  Normocephalic and atraumatic. Eyes:  Sclera clear, no icterus.   Conjunctiva pink. Mouth:  No deformity or lesions, dentition normal. Neck:  Supple; no masses or thyromegaly. Heart:  Regular rate and rhythm; no murmurs, clicks, rubs,  or gallops. Abdomen:  Soft, nontender and nondistended. No masses, hepatosplenomegaly or hernias noted. Normal bowel sounds, without guarding, and without rebound.   Msk:  Symmetrical without gross deformities. Normal posture. Pulses:  Normal pulses noted. Extremities:  Without clubbing or edema. Neurologic:  Alert and  oriented x4;  grossly normal neurologically. Skin:  Intact without significant lesions or rashes. Cervical Nodes:  No significant cervical adenopathy. Psych:  Alert and cooperative. Normal mood and affect.

## 2012-02-29 NOTE — Interval H&P Note (Signed)
History and Physical Interval Note:  02/29/2012 2:09 PM  Wanda [name]  has presented today for surgery, with the diagnosis of IDA  The various methods of treatment have been discussed with the patient and family. After consideration of risks, benefits and other options for treatment, the patient has consented to  Procedure(s) (LRB): COLONOSCOPY (N/A) ESOPHAGOGASTRODUODENOSCOPY (EGD) (N/A) GIVENS CAPSULE STUDY (N/A) as a surgical intervention .  The patients' history has been reviewed, patient examined, no change in status, stable for surgery.  I have reviewed the patients' chart and labs.  Questions were answered to the patient's satisfaction.     Eula Listen

## 2012-02-29 NOTE — Op Note (Signed)
[facility] [address] [place], [place]  [address]  COLONOSCOPY PROCEDURE REPORT  PATIENT:  Wanda [name], Wanda [name]  MR#:  [id number] BIRTHDATE:  1933/05/09, 78 yrs. old  GENDER:  female ENDOSCOPIST:  R. Roetta Sessions, MD FACP [facility] REF. BY:  Ninfa Linden, NP Christin Bach, M.D. PROCEDURE DATE:  02/29/2012 PROCEDURE:  Ileo-colonoscopy with biopsy  INDICATIONS:  Hemoccult-positive stool; persisting iron deficiency anemia.  INFORMED CONSENT:  The risks, benefits, alternatives and imponderables including but not limited to bleeding, perforation as well as the possibility of a missed lesion have been reviewed. The potential for biopsy, lesion removal, etc. have also been discussed.  Questions have been answered.  All parties agreeable. Please see the history and physical in the medical record for more information.  MEDICATIONS:  Demerol 50 mg IV and Versed 3 mg IV in divided doses.  DESCRIPTION OF PROCEDURE:  After a digital rectal exam was performed, the EC-[id number] ([id number]) colonoscope was advanced from the anus through the rectum and colon to the area of the cecum, ileocecal valve and appendiceal orifice.  The cecum was deeply intubated.  These structures were well-seen and photographed for the record.  From the level of the cecum and ileocecal valve, the scope was slowly and cautiously withdrawn.  The mucosal surfaces were carefully surveyed utilizing scope tip deflection to facilitate fold flattening as needed.  The scope was pulled down into the rectum where a thorough examination including retroflexion was performed. <<PROCEDUREIMAGES>>  FINDINGS:  Suboptimal but doable prep. Minimal internal hemorrhoids; otherwise normal rectum. Extensive left-sided and transverse diverticula;          single   2 mm polyp in the ascending segment; remainder of colonic mucosa appeared normal. Normal distal 5 cm of terminal ileal mucosa.  THERAPEUTIC / DIAGNOSTIC MANEUVERS  PERFORMED:    The single  ascending colon polyp was cold biopsied / removed.  COMPLICATIONS:  None  CECAL WITHDRAWAL TIME:    12 minutes  IMPRESSION:    Minimal internal hemorrhoids. Colonic diverticulosis. Single diminutive polyp in the ascending colon-removed as described above.  RECOMMENDATIONS:   See EGD report. Followup on Pathology.  ______________________________ R. Roetta Sessions, MD Caleen Essex  CC:  Ninfa Linden, NP Christin Bach, M.D.  n. eSIGNED:   R. Roetta Sessions at 02/29/2012 02:44 PM  Obie Dredge,   [phone]

## 2012-02-29 NOTE — Op Note (Signed)
[facility] [address] [place], [place]  [address]  ENDOSCOPY PROCEDURE REPORT  PATIENT:  Wanda [name], Wanda [name]  MR#:  [id number] BIRTHDATE:  1933/01/18, 78 yrs. old  GENDER:  female  ENDOSCOPIST:  R. Roetta Sessions, MD Caleen Essex Referred by:  Christin Bach, M.D. Ninfa Linden, NP  PROCEDURE DATE:  02/29/2012 PROCEDURE:  EGD with a video capsule deployment into duodenum  INDICATIONS:   Hemoccult-positive stools; iron deficiency anemia; negative colonoscopy  INFORMED CONSENT:   The risks, benefits, limitations, alternatives and imponderables have been discussed.  The potential for biopsy, esophogeal dilation, etc. have also been reviewed.  Questions have been answered.  All parties agreeable.  Please see the history and physical in the medical record for more information.  MEDICATIONS:  Demerol 75 mg IV and Versed 5 mg IV in divided doses. Cetacaine spray  DESCRIPTION OF PROCEDURE:   The EG-[id number] ([id number]) endoscope was introduced through the mouth and advanced to the second portion of the duodenum without difficulty or limitations.  The mucosal surfaces were surveyed very carefully during advancement of the scope and upon withdrawal.  Retroflexion view of the proximal stomach and esophagogastric junction was performed.  <<PROCEDUREIMAGES>>  FINDINGS:  Normal appearing esophageal mucosa with somewhat of a baggy or atonic esophageal body . Stomach empty. Moderate-size hernia. Tiny fundal gland polyps present. No ulcer or infiltrating process. Patent pylorus. Normal first and second portion of the duodenum.  THERAPEUTIC / DIAGNOSTIC MANEUVERS PERFORMED:  Scope withdrawn. The capsule deployment device was loaded on the gastroscope. The capsule was loaded. The scope was reintroduced into the duodenum and the capsule was deployed into the duodenum under endoscopic control.  COMPLICATIONS:   None  IMPRESSION:   Somewhat atonic esophagus. Moderate sized  hiatal hernia. Fundal gland polyps. Status post delivery of the vido capsule into the small intestine.  RECOMMENDATIONS: See colonoscopy report.  ______________________________ R. Roetta Sessions, MD Caleen Essex  CC:  n. eSIGNED:   R. Roetta Sessions at 02/29/2012 03:04 PM  Obie Dredge, [phone]

## 2012-03-01 ENCOUNTER — Ambulatory Visit (INDEPENDENT_AMBULATORY_CARE_PROVIDER_SITE_OTHER): Payer: Medicare Other | Admitting: Orthopedic Surgery

## 2012-03-01 ENCOUNTER — Encounter: Payer: Self-pay | Admitting: Orthopedic Surgery

## 2012-03-01 VITALS — BP 120/62 | Ht 60.0 in | Wt 132.0 lb

## 2012-03-01 DIAGNOSIS — S52509A Unspecified fracture of the lower end of unspecified radius, initial encounter for closed fracture: Secondary | ICD-10-CM

## 2012-03-01 DIAGNOSIS — S52599A Other fractures of lower end of unspecified radius, initial encounter for closed fracture: Secondary | ICD-10-CM

## 2012-03-01 DIAGNOSIS — M7500 Adhesive capsulitis of unspecified shoulder: Secondary | ICD-10-CM

## 2012-03-01 DIAGNOSIS — M7501 Adhesive capsulitis of right shoulder: Secondary | ICD-10-CM

## 2012-03-01 NOTE — Patient Instructions (Signed)
You have received a steroid shot. 15% of patients experience increased pain at the injection site with in the next 24 hours. This is best treated with ice and tylenol extra strength 2 tabs every 8 hours. If you are still having pain please call the office.    

## 2012-03-01 NOTE — Progress Notes (Signed)
Patient ID: Wanda [name], female   DOB: 06/10/1933, 78 y.o.   MRN: [id number] Chief Complaint  Patient presents with  . Follow-up    Recheck on right wrist     Pain right shoulder   (no problems with right wrist)   Chief complaint today is pain in the RIGHT shoulder with stiffness.  The patient had a RIGHT wrist fracture, developed osteoporosis of disuse and a CT scan showed no abnormalities other than decreased bone mineral density.  Note in injury to the RIGHT shoulder.  Clinical exam shows decreased range of motion. No weakness. Muscle tone is normal. Tenderness around the shoulder acromion and deltoid. Shoulder stable shoulder very stiff. Passive range of motion, flexion, 60, abduction 70 external rotation, 15 active range of motion, very similar.  Neurovascular exam intact.  Impression adhesive capsulitis.  Subacromial injection.  Shoulder Injection Procedure Note   Pre-operative Diagnosis: right  Shoulder adhesive capsulitis Post-operative Diagnosis: same  Indications: pain   Anesthesia: ethyl chloride   Procedure Details   Verbal consent was obtained for the procedure. The shoulder was prepped withalcohol and the skin was anesthetized. A 20 gauge needle was advanced into the subacromial space through posterior approach without difficulty  The space was then injected with 3 ml 1% lidocaine and 1 ml of depomedrol. The injection site was cleansed with isopropyl alcohol and a dressing was applied.  Complications:  None; patient tolerated the procedure well.

## 2012-03-03 ENCOUNTER — Encounter (HOSPITAL_COMMUNITY): Payer: Self-pay | Admitting: Internal Medicine

## 2012-03-06 ENCOUNTER — Encounter: Payer: Self-pay | Admitting: Internal Medicine

## 2012-03-15 ENCOUNTER — Encounter: Payer: Self-pay | Admitting: Gastroenterology

## 2012-03-15 DIAGNOSIS — R933 Abnormal findings on diagnostic imaging of other parts of digestive tract: Secondary | ICD-10-CM

## 2012-03-15 DIAGNOSIS — D509 Iron deficiency anemia, unspecified: Secondary | ICD-10-CM

## 2012-03-15 DIAGNOSIS — R195 Other fecal abnormalities: Secondary | ICD-10-CM

## 2012-03-15 NOTE — Progress Notes (Signed)
Tried to call pt- LMOM 

## 2012-03-15 NOTE — Op Note (Signed)
Small Bowel Givens Capsule Study Procedure date:  02/29/2012  Referring Provider:  Christin Bach, MD PCP:  Dr. Ninfa Linden, NP, NP  Indication for procedure:   75 /o female with IDA. Prior w/u for anemia one year ago showed non-H.Pylori gastritis and diverticulosis. Heme negative at that time. Anemia improved. Recent preop labs for hysterectomy for uterine prolapse revealed Hgb 8.5, ferritin 9, iron 15, percent sat 3. Normal folate and B12. Recent heme positive stool. Recent regular NSAID use for radial wrist fx. EGD/TCS on 02/29/12 showed atonic esophagus, moderate sized hh, fundal gland polyps, colonic diverticulosis, minimal internal hemorrhoids, single benign ascending colon polyp.DA, heme positive stool.  Patient data:  Wt: 170lb Ht: 62 inches Waist: 36 inches  Findings:  Capsule placed at time of EGD.  First Duodenal image: 42m 56s First Ileo-Cecal Valve image: 3h 34m 52s First Cecal image: 3h 40m 55s Small Bowel Passage time:  3h 69m  Lymphangiectasia at 63m 42s. At 1h 40m 49s through 1h 9m, ?submucosal lesion versus external compression.   Summary & Recommendations: Normal appearing small bowel except for ?submucosal lesion versus external compression noted initially at 1h 11m 49s. Nothing seen to explain IDA or heme positive stool. Images reviewed with Dr. Jena Gauss. CT A/P with contrast recommended to complete GI work-up of IDA, heme positive stool.

## 2012-03-15 NOTE — Progress Notes (Unsigned)
Patient ID: Wanda [name], female   DOB: [birth year], 78 y.o.   MRN: [id number]  Please let patient know: SB capsule endoscopy did not show anything to explain anemia or heme positive stool. Reviewed images with Dr. Jena Gauss. ?small bowll submucosal lesion versus extrinsic compression noted. Needs CT A/P with contrast to further evaluation. If negative, then GI w/u complete.

## 2012-03-15 NOTE — Progress Notes (Signed)
Correction- LSL is not in the office

## 2012-03-15 NOTE — Progress Notes (Signed)
Pt informed- she stated she has a prolapsed uterus and wanted to know if LSL thought that it could be her uterus that is being seen? Advised pt that LSL in in office and I would call her back tomorrow. Pt is ok with this.

## 2012-03-15 NOTE — Progress Notes (Unsigned)
Yes. Just had rmr to review after lunch. Overall looks good. No obvious explanation for IDA. She did have one area which will likely be nothing but need to further investigate with CT A/P with contrast. ?Submucosal mass or something compressing on small bowel. If CT negative, then we will be finished with GI work-up. Please let pt know. Full report to follow. ----- Message ----- From: Evalee Mutton, LPN Sent: 1/61/0960 11:53 AM To: Tiffany Kocher, PA  Pt called for results yesterday. Did you read givens on this pt?   Tried to call pt- LMOM

## 2012-03-17 NOTE — Progress Notes (Signed)
Pt aware, ok to set up CT Crystal, please schedule.

## 2012-03-17 NOTE — Progress Notes (Signed)
Pt is aware of result and questions have been answered. Crystal to set up CT.

## 2012-03-17 NOTE — Progress Notes (Unsigned)
Could be external compression from any of her organs actually but to be safe, recommend ct.

## 2012-03-18 ENCOUNTER — Other Ambulatory Visit: Payer: Self-pay | Admitting: Gastroenterology

## 2012-03-18 DIAGNOSIS — K639 Disease of intestine, unspecified: Secondary | ICD-10-CM

## 2012-03-21 ENCOUNTER — Ambulatory Visit: Payer: Medicare Other | Admitting: Orthopedic Surgery

## 2012-03-21 NOTE — Progress Notes (Signed)
Pt is scheduled for CT on 05/10 @ 10:15- she will get labs on 05/09 and pick up contrast- Lab order faxed to [facility]

## 2012-03-24 ENCOUNTER — Other Ambulatory Visit: Payer: Self-pay | Admitting: Internal Medicine

## 2012-03-24 LAB — CREATININE, SERUM: Creat: 0.63 mg/dL (ref 0.50–1.10)

## 2012-03-25 ENCOUNTER — Encounter: Payer: Self-pay | Admitting: Gastroenterology

## 2012-03-25 ENCOUNTER — Ambulatory Visit (HOSPITAL_COMMUNITY)
Admission: RE | Admit: 2012-03-25 | Discharge: 2012-03-25 | Disposition: A | Discharge status: Home / Self Care | Payer: Medicare Other | Source: Ambulatory Visit | Attending: Gastroenterology | Admitting: Gastroenterology

## 2012-03-25 DIAGNOSIS — K6389 Other specified diseases of intestine: Secondary | ICD-10-CM | POA: Insufficient documentation

## 2012-03-25 DIAGNOSIS — K639 Disease of intestine, unspecified: Secondary | ICD-10-CM

## 2012-03-25 MED ORDER — IOHEXOL 300 MG/ML  SOLN
100.0000 mL | Freq: Once | INTRAMUSCULAR | Status: AC | PRN
  Administered 2012-03-25: 100 mL via INTRAVENOUS

## 2012-03-30 NOTE — Progress Notes (Signed)
Quick Note:  No small bowel mass or extrinsic mass.  ?lingular (lung) nodule vs scarring measuring 5mm. Recommend she f/u with PCP regarding lung nodule recommendations. May need to have f/u chest CT in 6-12 months.  Need to recheck CBC, iron and TIBC, ferritin at this time. ______

## 2012-03-31 NOTE — Progress Notes (Signed)
Quick Note:  OK from our standpoint. Dr. Emelda Fear will be most interested in what her hemoglobin is prior to surgery. ______

## 2012-03-31 NOTE — Progress Notes (Signed)
Results Cc to PCP  

## 2012-04-25 ENCOUNTER — Other Ambulatory Visit: Payer: Self-pay | Admitting: Obstetrics and Gynecology

## 2012-05-01 ENCOUNTER — Other Ambulatory Visit: Payer: Self-pay | Admitting: Obstetrics and Gynecology

## 2012-05-01 MED ORDER — MAGNESIUM CITRATE PO SOLN: 300.0000 mL | Freq: Once | ORAL | Status: DC

## 2012-05-06 ENCOUNTER — Encounter (HOSPITAL_COMMUNITY): Payer: Self-pay | Admitting: Pharmacy Technician

## 2012-05-10 NOTE — Patient Instructions (Addendum)
[address]  05/10/2012   Your procedure is scheduled on:  05/17/2012  Report to [facility] at  615  AM.  Call this number if you have problems the morning of surgery: [phone]   Remember:   Do not eat food:After Midnight.  May have clear liquids:until Midnight .    Take these medicines the morning of surgery with A SIP OF WATER: norco,lisinopril   Do not wear jewelry, make-up or nail polish.  Do not wear lotions, powders, or perfumes. You may wear deodorant.  Do not shave 48 hours prior to surgery. Men may shave face and neck.  Do not bring valuables to the hospital.  Contacts, dentures or bridgework may not be worn into surgery.  Leave suitcase in the car. After surgery it may be brought to your room.  For patients admitted to the hospital, checkout time is 11:00 AM the day of discharge.   Patients discharged the day of surgery will not be allowed to drive home.  Name and phone number of your driver: family  Special Instructions: CHG Shower Use Special Wash: 1/2 bottle night before surgery and 1/2 bottle morning of surgery.   Please read over the following fact sheets that you were given: Coughing and Deep Breathing, MRSA Information, Surgical Site Infection Prevention, Anesthesia Post-op Instructions and Care and Recovery After Surgery Laparoscopic Assisted Vaginal Hysterectomy  A laparoscopic assisted vaginal hysterectomy (LAVH) is a surgical procedure to remove the uterus and cervix, and sometimes the ovaries and fallopian tubes. During an LAVH, some of the surgical removal is done through the vagina and the rest is done through a few small surgical cuts (incisions) in the abdomen.  This procedure is usually considered in women when a vaginal hysterectomy (VH) is not an option. Your surgeon will discuss the risks and benefits of the different surgical techniques at your appointment. Symptoms such as chronic pelvic pain, pressure, and heavy or painful periods can be  treated with an LAVH. Generally, recovery time is faster and there are fewer complications after laparoscopic procedures than after open incisional procedures. LET YOUR CAREGIVER KNOW ABOUT:   Allergies to food or medicine.   Medicines taken, including vitamins, herbs, eyedrops, over-the-counter medicines, and creams.   Use of steroids (by mouth or creams).   Previous problems with anesthetics or numbing medicines.   History of bleeding problems or blood clots.   Previous surgery.   Other health problems, including diabetes and kidney problems.   Possibility of pregnancy.  RISKS AND COMPLICATIONS  Allergies to medicines.   Difficulty breathing.   Bleeding.   Infection.   Damage to other structures near the uterus and cervix.  BEFORE THE PROCEDURE  Ask your caregiver about changing or stopping your regular medicines.   Take certain medicines, such as a colon emptying preparation, as directed.   Do not eat or drink anything for at least 8 hours before your surgery.   Take a shower at home the night before your procedure.   Arrive without jewelry, makeup, nail polish, or any lotions or deodorants.   Stop smoking if you smoke. Stopping will improve your health after surgery.   Arrange for a ride home after surgery and for help at home during recovery.  PROCEDURE   An intravenous (IV) access tube will be put into one of your veins in order to give you fluids and medicines.   You will receive medicines to relax you and medicines that make you sleep (  general anesthetic).   You may have a flexible tube (catheter) put into your bladder to drain urine.   You may have a tube put through your nose or mouth that goes into your stomach (nasogastric tube). The nasogastric tube removes digestive fluids and prevents you from feeling nauseous and vomiting.   Tight fitting (compression) stockings will be placed on your legs to promote circulation.   Three to four small incisions  will be made in the abdomen. An incision is also made in the vagina. Probes and tools are inserted into the small incisions. The uterus and cervix are removed (and possibly your ovaries and fallopian tubes) through the vagina, as well as through the 3 to 4 small incisions that were made in the abdomen.   The vagina is then sewn back to normal.   The procedure takes about 1 to 3 hours.  AFTER THE PROCEDURE  Plan to be in the hospital for 1 to 2 days.   You may have abdominal cramping and a sore throat. Your pain will be controlled with medicine.   You may have a liquid diet temporarily. You will most likely return to, and tolerate, your usual diet the day after surgery.   You will be passing urine through a catheter. It will be removed the day after surgery.   Your temperature, breathing rate, heart rate, blood pressure, and oxygen level will be monitored regularly.   You will still wear compression stockings on your legs until you are able to move around.   You will use a special device or do breathing exercises to keep your lungs clear.   You will be encouraged to walk as soon as possible.   Expect a full recovery in 4 to 6 weeks after surgery.  Document Released: 10/22/2011 Document Reviewed: 10/20/2011 [facility] Patient Information 2012 [place], [place].PATIENT INSTRUCTIONS POST-ANESTHESIA  IMMEDIATELY FOLLOWING SURGERY:  Do not drive or operate machinery for the first twenty four hours after surgery.  Do not make any important decisions for twenty four hours after surgery or while taking narcotic pain medications or sedatives.  If you develop intractable nausea and vomiting or a severe headache please notify your doctor immediately.  FOLLOW-UP:  Please make an appointment with your surgeon as instructed. You do not need to follow up with anesthesia unless specifically instructed to do so.  WOUND CARE INSTRUCTIONS (if applicable):  Keep a dry clean dressing on the  anesthesia/puncture wound site if there is drainage.  Once the wound has quit draining you may leave it open to air.  Generally you should leave the bandage intact for twenty four hours unless there is drainage.  If the epidural site drains for more than 36-48 hours please call the anesthesia department.  QUESTIONS?:  Please feel free to call your physician or the hospital operator if you have any questions, and they will be happy to assist you.

## 2012-05-11 ENCOUNTER — Encounter (HOSPITAL_COMMUNITY): Payer: Self-pay

## 2012-05-11 ENCOUNTER — Encounter (HOSPITAL_COMMUNITY)
Admission: RE | Admit: 2012-05-11 | Discharge: 2012-05-11 | Disposition: A | Discharge status: Home / Self Care | Payer: Medicare Other | Source: Ambulatory Visit | Attending: Obstetrics and Gynecology | Admitting: Obstetrics and Gynecology

## 2012-05-11 LAB — URINALYSIS, ROUTINE W REFLEX MICROSCOPIC
Bilirubin Urine: NEGATIVE
Glucose, UA: NEGATIVE mg/dL
Ketones, ur: NEGATIVE mg/dL
Nitrite: NEGATIVE
Protein, ur: NEGATIVE mg/dL
Specific Gravity, Urine: 1.015 (ref 1.005–1.030)
Urobilinogen, UA: 0.2 mg/dL (ref 0.0–1.0)
pH: 6.5 (ref 5.0–8.0)

## 2012-05-11 LAB — SURGICAL PCR SCREEN
MRSA, PCR: NEGATIVE
Staphylococcus aureus: NEGATIVE

## 2012-05-11 LAB — COMPREHENSIVE METABOLIC PANEL
ALT: 18 U/L (ref 0–35)
AST: 23 U/L (ref 0–37)
Albumin: 3.9 g/dL (ref 3.5–5.2)
Alkaline Phosphatase: 104 U/L (ref 39–117)
BUN: 14 mg/dL (ref 6–23)
CO2: 27 mEq/L (ref 19–32)
Calcium: 9.8 mg/dL (ref 8.4–10.5)
Chloride: 101 mEq/L (ref 96–112)
Creatinine, Ser: 0.61 mg/dL (ref 0.50–1.10)
GFR calc Af Amer: >90 mL/min (ref >90)
GFR calc non Af Amer: 86 mL/min — ABNORMAL LOW (ref >90)
Glucose, Bld: 93 mg/dL (ref 70–99)
Potassium: 4.6 mEq/L (ref 3.5–5.1)
Sodium: 138 mEq/L (ref 135–145)
Total Bilirubin: 0.6 mg/dL (ref 0.3–1.2)
Total Protein: 7.1 g/dL (ref 6.0–8.3)

## 2012-05-11 LAB — CBC
HCT: 36.3 % (ref 36.0–46.0)
Hemoglobin: 11.9 g/dL — ABNORMAL LOW (ref 12.0–15.0)
MCH: 29.7 pg (ref 26.0–34.0)
MCHC: 32.8 g/dL (ref 30.0–36.0)
MCV: 90.5 fL (ref 78.0–100.0)
Platelets: 295 10*3/uL (ref 150–400)
RBC: 4.01 MIL/uL (ref 3.87–5.11)
RDW: 15.5 % (ref 11.5–15.5)
WBC: 7.7 10*3/uL (ref 4.0–10.5)

## 2012-05-11 LAB — URINE MICROSCOPIC-ADD ON

## 2012-05-11 LAB — PREPARE RBC (CROSSMATCH)

## 2012-05-17 ENCOUNTER — Encounter (HOSPITAL_COMMUNITY): Payer: Self-pay | Admitting: *Deleted

## 2012-05-17 ENCOUNTER — Encounter (HOSPITAL_COMMUNITY)
Admission: RE | Disposition: A | Discharge status: Home / Self Care | Payer: Self-pay | Source: Ambulatory Visit | Attending: Obstetrics and Gynecology

## 2012-05-17 ENCOUNTER — Ambulatory Visit (HOSPITAL_COMMUNITY)
Admission: RE | Admit: 2012-05-17 | Discharge: 2012-05-18 | Disposition: A | Discharge status: Home / Self Care | Payer: Medicare Other | Source: Ambulatory Visit | Attending: Obstetrics and Gynecology | Admitting: Obstetrics and Gynecology

## 2012-05-17 ENCOUNTER — Encounter (HOSPITAL_COMMUNITY): Payer: Self-pay | Admitting: Anesthesiology

## 2012-05-17 ENCOUNTER — Inpatient Hospital Stay (HOSPITAL_COMMUNITY): Payer: Medicare Other | Admitting: Anesthesiology

## 2012-05-17 DIAGNOSIS — I10 Essential (primary) hypertension: Secondary | ICD-10-CM | POA: Insufficient documentation

## 2012-05-17 DIAGNOSIS — G4733 Obstructive sleep apnea (adult) (pediatric): Secondary | ICD-10-CM | POA: Insufficient documentation

## 2012-05-17 DIAGNOSIS — N3941 Urge incontinence: Secondary | ICD-10-CM | POA: Insufficient documentation

## 2012-05-17 DIAGNOSIS — N814 Uterovaginal prolapse, unspecified: Principal | ICD-10-CM | POA: Insufficient documentation

## 2012-05-17 DIAGNOSIS — N812 Incomplete uterovaginal prolapse: Secondary | ICD-10-CM | POA: Diagnosis present

## 2012-05-17 DIAGNOSIS — Z01812 Encounter for preprocedural laboratory examination: Secondary | ICD-10-CM | POA: Insufficient documentation

## 2012-05-17 DIAGNOSIS — N816 Rectocele: Secondary | ICD-10-CM | POA: Insufficient documentation

## 2012-05-17 DIAGNOSIS — E559 Vitamin D deficiency, unspecified: Secondary | ICD-10-CM | POA: Insufficient documentation

## 2012-05-17 DIAGNOSIS — Z79899 Other long term (current) drug therapy: Secondary | ICD-10-CM | POA: Insufficient documentation

## 2012-05-17 HISTORY — PX: ANTERIOR AND POSTERIOR REPAIR: SHX5121

## 2012-05-17 HISTORY — PX: SALPINGOOPHORECTOMY: SHX82

## 2012-05-17 HISTORY — PX: VAGINAL HYSTERECTOMY: SHX2639

## 2012-05-17 SURGERY — HYSTERECTOMY, VAGINAL
Anesthesia: Spinal | Site: Vagina | Wound class: Clean Contaminated

## 2012-05-17 MED ORDER — LACTATED RINGERS IV SOLN
INTRAVENOUS | Status: DC
  Administered 2012-05-17 (×3): via INTRAVENOUS

## 2012-05-17 MED ORDER — BUPIVACAINE IN DEXTROSE 0.75-8.25 % IT SOLN
INTRATHECAL | Status: AC
  Filled 2012-05-17: qty 2

## 2012-05-17 MED ORDER — BUPIVACAINE-EPINEPHRINE 0.5% -1:200000 IJ SOLN
INTRAMUSCULAR | Status: DC | PRN
  Administered 2012-05-17: 5 mL
  Administered 2012-05-17: 8 mL
  Administered 2012-05-17: 7 mL

## 2012-05-17 MED ORDER — STERILE WATER FOR IRRIGATION IR SOLN
Status: DC | PRN
  Administered 2012-05-17: 1000 mL

## 2012-05-17 MED ORDER — MIDAZOLAM HCL 2 MG/2ML IJ SOLN
1.0000 mg | INTRAMUSCULAR | Status: DC | PRN
  Administered 2012-05-17: 2 mg via INTRAVENOUS

## 2012-05-17 MED ORDER — LISINOPRIL 10 MG PO TABS
20.0000 mg | ORAL_TABLET | Freq: Every day | ORAL | Status: DC
  Filled 2012-05-17: qty 2

## 2012-05-17 MED ORDER — 0.9 % SODIUM CHLORIDE (POUR BTL) OPTIME
TOPICAL | Status: DC | PRN
  Administered 2012-05-17: 1000 mL

## 2012-05-17 MED ORDER — ONDANSETRON HCL 4 MG/2ML IJ SOLN
4.0000 mg | Freq: Four times a day (QID) | INTRAMUSCULAR | Status: DC | PRN
  Administered 2012-05-17: 4 mg via INTRAVENOUS
  Filled 2012-05-17: qty 2

## 2012-05-17 MED ORDER — FENTANYL CITRATE 0.05 MG/ML IJ SOLN
INTRAMUSCULAR | Status: DC | PRN
  Administered 2012-05-17 (×7): 12.5 ug via INTRAVENOUS

## 2012-05-17 MED ORDER — LIDOCAINE HCL (PF) 1 % IJ SOLN
INTRAMUSCULAR | Status: AC
  Filled 2012-05-17: qty 5

## 2012-05-17 MED ORDER — PROPOFOL 10 MG/ML IV EMUL
INTRAVENOUS | Status: AC
  Filled 2012-05-17: qty 20

## 2012-05-17 MED ORDER — FENTANYL CITRATE 0.05 MG/ML IJ SOLN
INTRAMUSCULAR | Status: AC
  Administered 2012-05-17: 50 ug via INTRAVENOUS
  Filled 2012-05-17: qty 2

## 2012-05-17 MED ORDER — SODIUM CHLORIDE 0.9 % IV SOLN
INTRAVENOUS | Status: DC
  Administered 2012-05-17 – 2012-05-18 (×3): via INTRAVENOUS

## 2012-05-17 MED ORDER — ONDANSETRON HCL 4 MG PO TABS: 4.0000 mg | ORAL_TABLET | Freq: Four times a day (QID) | ORAL | Status: DC | PRN

## 2012-05-17 MED ORDER — PANTOPRAZOLE SODIUM 40 MG IV SOLR
40.0000 mg | Freq: Every day | INTRAVENOUS | Status: DC
  Administered 2012-05-17: 40 mg via INTRAVENOUS
  Filled 2012-05-17: qty 40

## 2012-05-17 MED ORDER — PROPOFOL 10 MG/ML IV EMUL
INTRAVENOUS | Status: DC | PRN
  Administered 2012-05-17: 50 ug/kg/min via INTRAVENOUS

## 2012-05-17 MED ORDER — FENTANYL CITRATE 0.05 MG/ML IJ SOLN
25.0000 ug | INTRAMUSCULAR | Status: DC | PRN
  Administered 2012-05-17: 50 ug via INTRAVENOUS

## 2012-05-17 MED ORDER — HYDROMORPHONE HCL PF 1 MG/ML IJ SOLN
0.2000 mg | INTRAMUSCULAR | Status: DC | PRN
  Administered 2012-05-17 (×3): 0.5 mg via INTRAVENOUS
  Filled 2012-05-17 (×3): qty 1

## 2012-05-17 MED ORDER — LIDOCAINE HCL (CARDIAC) 10 MG/ML IV SOLN
INTRAVENOUS | Status: DC | PRN
  Administered 2012-05-17: 10 mg via INTRAVENOUS

## 2012-05-17 MED ORDER — OXYCODONE-ACETAMINOPHEN 5-325 MG PO TABS
1.0000 | ORAL_TABLET | ORAL | Status: DC | PRN
  Administered 2012-05-17: 2 via ORAL
  Filled 2012-05-17: qty 2

## 2012-05-17 MED ORDER — BUPIVACAINE IN DEXTROSE 0.75-8.25 % IT SOLN
INTRATHECAL | Status: DC | PRN
  Administered 2012-05-17: 15 mg via INTRATHECAL

## 2012-05-17 MED ORDER — FENTANYL CITRATE 0.05 MG/ML IJ SOLN
INTRAMUSCULAR | Status: DC | PRN
  Administered 2012-05-17: 12.5 ug via INTRATHECAL

## 2012-05-17 MED ORDER — FENTANYL CITRATE 0.05 MG/ML IJ SOLN
INTRAMUSCULAR | Status: AC
  Filled 2012-05-17: qty 2

## 2012-05-17 MED ORDER — ZOLPIDEM TARTRATE 5 MG PO TABS: 5.0000 mg | ORAL_TABLET | Freq: Every evening | ORAL | Status: DC | PRN

## 2012-05-17 MED ORDER — CEFAZOLIN SODIUM-DEXTROSE 2-3 GM-% IV SOLR: 2.0000 g | INTRAVENOUS | Status: DC

## 2012-05-17 MED ORDER — KETOROLAC TROMETHAMINE 30 MG/ML IJ SOLN
30.0000 mg | Freq: Four times a day (QID) | INTRAMUSCULAR | Status: AC
  Administered 2012-05-17 (×2): 30 mg via INTRAVENOUS
  Filled 2012-05-17 (×3): qty 1

## 2012-05-17 MED ORDER — CEFAZOLIN SODIUM 1-5 GM-% IV SOLN
INTRAVENOUS | Status: AC
  Administered 2012-05-17: 1 g via INTRAVENOUS
  Filled 2012-05-17: qty 50

## 2012-05-17 MED ORDER — ONDANSETRON HCL 4 MG/2ML IJ SOLN: 4.0000 mg | Freq: Once | INTRAMUSCULAR | Status: DC | PRN

## 2012-05-17 MED ORDER — BUPIVACAINE-EPINEPHRINE PF 0.5-1:200000 % IJ SOLN
INTRAMUSCULAR | Status: AC
  Filled 2012-05-17: qty 10

## 2012-05-17 MED ORDER — KETOROLAC TROMETHAMINE 30 MG/ML IJ SOLN: 30.0000 mg | Freq: Four times a day (QID) | INTRAMUSCULAR | Status: AC

## 2012-05-17 MED ORDER — CEFAZOLIN SODIUM 1-5 GM-% IV SOLN: 1.0000 g | INTRAVENOUS | Status: DC

## 2012-05-17 MED ORDER — MIDAZOLAM HCL 2 MG/2ML IJ SOLN
INTRAMUSCULAR | Status: AC
  Administered 2012-05-17: 2 mg via INTRAVENOUS
  Filled 2012-05-17: qty 2

## 2012-05-17 MED ORDER — DOCUSATE SODIUM 100 MG PO CAPS
100.0000 mg | ORAL_CAPSULE | Freq: Two times a day (BID) | ORAL | Status: DC
  Administered 2012-05-18: 100 mg via ORAL
  Filled 2012-05-17: qty 1

## 2012-05-17 MED ORDER — KETOROLAC TROMETHAMINE 30 MG/ML IJ SOLN: 30.0000 mg | Freq: Once | INTRAMUSCULAR | Status: DC

## 2012-05-17 SURGICAL SUPPLY — 42 items
BAG HAMPER (MISCELLANEOUS) ×3 IMPLANT
CLOTH BEACON ORANGE TIMEOUT ST (SAFETY) ×3 IMPLANT
COVER LIGHT HANDLE STERIS (MISCELLANEOUS) ×6 IMPLANT
COVER MAYO STAND XLG (DRAPE) ×3 IMPLANT
DECANTER SPIKE VIAL GLASS SM (MISCELLANEOUS) ×3 IMPLANT
DRAPE PROXIMA HALF (DRAPES) ×3 IMPLANT
DRAPE STERI URO 9X17 APER PCH (DRAPES) ×3 IMPLANT
ELECT REM PT RETURN 9FT ADLT (ELECTROSURGICAL) ×3
ELECTRODE REM PT RTRN 9FT ADLT (ELECTROSURGICAL) ×2 IMPLANT
FORMALIN 10 PREFIL 120ML (MISCELLANEOUS) ×3 IMPLANT
FORMALIN 10 PREFIL 480ML (MISCELLANEOUS) ×3 IMPLANT
GAUZE PACKING 2X5 YD STERILE (GAUZE/BANDAGES/DRESSINGS) ×3 IMPLANT
GLOVE BIOGEL PI IND STRL 6.5 (GLOVE) ×2 IMPLANT
GLOVE BIOGEL PI IND STRL 7.0 (GLOVE) ×4 IMPLANT
GLOVE BIOGEL PI INDICATOR 6.5 (GLOVE) ×1
GLOVE BIOGEL PI INDICATOR 7.0 (GLOVE) ×2
GLOVE ECLIPSE 6.5 STRL STRAW (GLOVE) ×6 IMPLANT
GLOVE ECLIPSE 9.0 STRL (GLOVE) ×3 IMPLANT
GLOVE EXAM NITRILE MD LF STRL (GLOVE) ×3 IMPLANT
GLOVE INDICATOR STER SZ 9 (GLOVE) ×3 IMPLANT
GOWN STRL REIN 3XL LVL4 (GOWN DISPOSABLE) ×3 IMPLANT
GOWN STRL REIN XL XLG (GOWN DISPOSABLE) ×9 IMPLANT
IV NS IRRIG 3000ML ARTHROMATIC (IV SOLUTION) ×3 IMPLANT
KIT ROOM TURNOVER AP CYSTO (KITS) ×3 IMPLANT
MANIFOLD NEPTUNE II (INSTRUMENTS) ×3 IMPLANT
NEEDLE HYPO 25X1 1.5 SAFETY (NEEDLE) ×3 IMPLANT
NS IRRIG 1000ML POUR BTL (IV SOLUTION) ×3 IMPLANT
PACK PERI GYN (CUSTOM PROCEDURE TRAY) ×3 IMPLANT
PAD ARMBOARD 7.5X6 YLW CONV (MISCELLANEOUS) ×3 IMPLANT
SET BASIN LINEN APH (SET/KITS/TRAYS/PACK) ×3 IMPLANT
SET IV ADMIN VERSALIGHT (MISCELLANEOUS) ×3 IMPLANT
SUT CHROMIC 0 CT 1 (SUTURE) ×30 IMPLANT
SUT CHROMIC 2 0 CT 1 (SUTURE) ×9 IMPLANT
SUT CHROMIC GUT BROWN 0 54 (SUTURE) IMPLANT
SUT CHROMIC GUT BROWN 0 54IN (SUTURE)
SUT PROLENE 2 0 SH 30 (SUTURE) ×3 IMPLANT
SUT VIC AB 0 CT2 8-18 (SUTURE) ×3 IMPLANT
SYR CONTROL 10ML LL (SYRINGE) ×3 IMPLANT
TRAY FOLEY BAG SILVER LF 14FR (CATHETERS) ×3 IMPLANT
TRAY FOLEY CATH 14FR (SET/KITS/TRAYS/PACK) ×3 IMPLANT
VERSALIGHT (MISCELLANEOUS) IMPLANT
WATER STERILE IRR 1000ML POUR (IV SOLUTION) ×3 IMPLANT

## 2012-05-17 NOTE — H&P (Signed)
Wanda [name] is an 78 y.o. female. She is admitted for a vaginal hysterectomy anterior and posterior repair. Ovaries and tubes will we removed if accessible. She has been followed at [facility] for many years for uterine prolapse treated with a ring pessary, but has been developed urge incontinence symptoms possibly attributable to the pessary effect. She has had an endometrial biopsy that was benign. She has had recent anemia, there was recurrent and had referral to GI where colonoscopy was performed and was negative Pap smear recently performed 2/ 28 2013 was benign Pertinent Gynecological History: Menses: post-menopausal without bleeding Bleeding: None Contraception: post menopausal status DES exposure: unknown Blood transfusions: none anemia responded to iron supplementation Sexually transmitted diseases: no past history Previous GYN Procedures: Endometrial biopsy last month benign  Last mammogram:  Date:  Last pap: normal Date: 12/25/11 OB History: G3, P3   Menstrual History: Menarche age:  No LMP recorded. Patient is postmenopausal.    Past Medical History  Diagnosis Date  . IDA (iron deficiency anemia)   . GERD (gastroesophageal reflux disease)   . Gastritis 12/08/10    EGD by Dr. Nancy Fetter hiatal hernia, duodenal diverticulum, chronic gastritis  . Diverticulosis 10/20/10    Left-sided on colonoscopy by Dr. Jena Gauss 10/20/10  . HTN (hypertension)   . Vitamin d deficiency   . Osteoarthritis   . Heart murmur   . Sleep apnea     STOP BANG SCORE 4  . Cystocele   . Urge incontinence   . Radial fracture     (right) Undergoing treatment by Dr. Romeo Apple currently    Past Surgical History  Procedure Date  . Colonoscopy 02/29/2012    Procedure: COLONOSCOPY;  Surgeon: Corbin Ade, MD;  Location: [facility];  Service: Endoscopy;  Laterality: N/A;  12:15  . Esophagogastroduodenoscopy 02/29/2012    Procedure: ESOPHAGOGASTRODUODENOSCOPY (EGD);  Surgeon: Corbin Ade, MD;  Location: [facility];  Service: Endoscopy;  Laterality: N/A;  . Givens capsule study 02/29/2012    Procedure: GIVENS CAPSULE STUDY;  Surgeon: Corbin Ade, MD;  Location: [facility];  Service: Endoscopy;  Laterality: N/A;    Family History  Problem Relation Age of Onset  . Hypertension Mother   . Lymphoma Brother   . Anesthesia problems Neg Hx   . Hypotension Neg Hx   . Malignant hyperthermia Neg Hx   . Pseudochol deficiency Neg Hx     Social History:  reports that she has never smoked. She does not have any smokeless tobacco history on file. She reports that she does not drink alcohol or use illicit drugs.  Allergies:  Allergies  Allergen Reactions  . Naproxen Sodium Rash and Other (See Comments)    Passed out    Prescriptions prior to admission  Medication Sig Dispense Refill  . cholecalciferol (VITAMIN D) 1000 UNITS tablet Take 1,000 Units by mouth daily.       Marland Kitchen docusate sodium (COLACE) 100 MG capsule Take 100 mg by mouth as needed. For congestion      . Fe Fum-FePoly-Vit C-Vit B3 (INTEGRA) 62.5-62.5-40-3 MG CAPS Take 1 capsule by mouth daily.      Marland Kitchen HYDROcodone-acetaminophen (NORCO) 5-325 MG per tablet Take 1 tablet by mouth every 6 (six) hours as needed. For severe pain      . ibuprofen (ADVIL,MOTRIN) 200 MG tablet Take 600 mg by mouth every 6 (six) hours as needed. For pain      . lisinopril (PRINIVIL,ZESTRIL) 20 MG tablet  Take 20 mg by mouth daily.          ROS recent labs from her primary care physician, [facility] [address] [address] include hemoglobin A1c of 5.7 hemoglobin 12 hematocrit 37 BUN 25 creatinine 0.55  Blood pressure 110/62, pulse 63, temperature 97.7 F (36.5 C), temperature source Oral, resp. rate 22, SpO2 96.00%. Physical ExamPhysical Examination: General appearance - alert, well appearing, and in no distress, oriented to person, place, and time and normal appearing weight Mental status - alert,  oriented to person, place, and time Chest - clear to auscultation, no wheezes, rales or rhonchi, symmetric air entry Heart - normal rate and regular rhythm Abdomen - soft, nontender, nondistended, no masses or organomegaly Pelvic - VULVA: normal appearing vulva with no masses, tenderness or lesions, VAGINA: PELVIC FLOOR EXAM: cystocele present to introitus,, uterine descensus grade 2, CERVIX: normal appearing cervix without discharge or lesions, descends to near introitus, UTERUS: uterus is normal size, shape, consistency and nontender, small sounds to 7 cm on biopsy, ADNEXA: normal adnexa in size, nontender and no masses, RECTAL: normal rectal, no masses, declined by the patient, positive Hemoccult in February has been evaluated with negative colonoscopy Extremities - peripheral pulses normal, no pedal edema, no clubbing or cyanosis   No results found for this or any previous visit (from the past 24 hour(s)).  No results found.  Assessment/Plan: Second degree uterine descensus, cystocele Recent positive Hemoccult February 2013 was negative upon GI  evaluation Recovered anemia Plan: Vaginal hysterectomy anterior and posterior repair with probable removal of tubes and ovaries Wanda [name] 05/17/2012, 6:55 AM

## 2012-05-17 NOTE — Anesthesia Procedure Notes (Addendum)
Date/Time: 05/17/2012 7:35 AM Performed by: Franco Nones Pre-anesthesia Checklist: Patient identified, Emergency Drugs available, Suction available, Timeout performed and Patient being monitored Patient Re-evaluated:Patient Re-evaluated prior to inductionOxygen Delivery Method: Non-rebreather mask    Spinal  Patient location during procedure: OR Start time: 05/17/2012 7:41 AM End time: 05/17/2012 7:45 AM Staffing CRNA/Resident: Minerva Areola S Preanesthetic Checklist Completed: patient identified, site marked, surgical consent, pre-op evaluation, timeout performed, IV checked, risks and benefits discussed and monitors and equipment checked Spinal Block Patient position: right lateral decubitus Prep: Betadine and prep x 3 Patient monitoring: heart rate, cardiac monitor, continuous pulse ox and blood pressure Approach: right paramedian Interspace: 1 % lidocaine skinwheal 1 cc. Injection technique: single-shot Needle Needle type: Spinocan  Needle gauge: 22 G Needle length: 9 cm Assessment Sensory level: T6 (level at 750) Events: clear CSF  pre and post injection Additional Notes ATTEMPTS:1 TRAY [id number] TRAY EXPIRATION [id number]

## 2012-05-17 NOTE — Anesthesia Postprocedure Evaluation (Signed)
Anesthesia Post Note  Patient: Wanda [name]  Procedure(s) Performed: Procedure(s) (LRB): HYSTERECTOMY VAGINAL (N/A) ANTERIOR (CYSTOCELE) AND POSTERIOR REPAIR (RECTOCELE) (N/A) SALPINGO OOPHERECTOMY (Bilateral)  Anesthesia type: Spinal  Patient location: PACU  Post pain: Pain level controlled  Post assessment: Post-op Vital signs reviewed, Patient's Cardiovascular Status Stable, Respiratory Function Stable, Patent Airway, No signs of Nausea or vomiting and Pain level controlled  Last Vitals:  Filed Vitals:   05/17/12 0949  BP: 95/47  Pulse: 66  Temp: 36.5 C  Resp: 14    Post vital signs: Reviewed and stable  Level of consciousness: awake and alert   Complications: No apparent anesthesia complications

## 2012-05-17 NOTE — Brief Op Note (Signed)
05/17/2012  10:03 AM  PATIENT:  Wanda [name]  78 y.o. female  PRE-OPERATIVE DIAGNOSIS:  PELVIC RELAXATION Cystocele, rectocele   POST-OPERATIVE DIAGNOSIS:  PELVIC RELAXATION Cystocele, rectocele  PROCEDURE:  Procedure(s) (LRB): HYSTERECTOMY VAGINAL (N/A) ANTERIOR (CYSTOCELE) AND POSTERIOR REPAIR (RECTOCELE) (N/A) SALPINGO OOPHERECTOMY (Bilateral)  SURGEON:  Surgeon(s) and Role:    * Tilda Burrow, MD - Primary  PHYSICIAN ASSISTANT:   ASSISTANTS: [place] RN-FA   ANESTHESIA:   spinal  EBL:  Total I/O In: 1700 [I.V.:1700] Out: 50 [Blood:50]  BLOOD ADMINISTERED:none  DRAINS: Urinary Catheter (Foley)   LOCAL MEDICATIONS USED:  MARCAINE    and Amount: 15 ml  SPECIMEN:  Source of Specimen:  Uterus cervix tubes and ovaries anterior and posterior vaginal mucosa  DISPOSITION OF SPECIMEN:  PATHOLOGY  COUNTS:  YES  TOURNIQUET:  * No tourniquets in log *  DICTATION: .Dragon Dictation Patient was taken operating room prepped and draped for vaginal surgery with legs in high lithotomy candycane supports. ML was conducted perineum prepped, antibiotics Ancef 1 g administered preoperatively. A speculum was inserted in the vagina, posterior colpotomy incision performed and the cervix which protruded through the introitus with minimal traction was circumscribed through the skin layer with knife. Uterosacral ligaments were identifiable, quite firm with good support they were crossclamped transected and ligated with 0 chromic and tagged for future identification. The lower cardinal ligaments were clamped cut and suture ligated using Zeppelin clamp, 0 chromic and Mayo scissor transection. Upper cardinal ligaments were treated similarly. Prior to cutting the cardinal ligaments, the anterior cervicovaginal fornix could be identified by placing a finger behind the uterus around into the anterior cervicovaginal fornix, identifying the thin vesicouterine peritoneal layer and opening anteriorly.  The bladder remained intact and well out of the way. Deaver retractor was used to keep the bladder out of the way. Broad ligament was serially clamped cut and suture ligated, and hemostasis was satisfactory. Upon reaching the level of the utero-ovarian ligament and fallopian tube, these were clamped cut and suture ligated and tagged. Knuckles were inspected and confirmed as hemostatic. Babcock clamp could be used to place the infundibulopelvic ligament on slight traction and the IP ligament could be clamped just above the ovary remained in the tube in its entirety and staying well away from the pelvic sidewall. This pedicle was then ligated and tagged for future inspection. Hemostasis was confirmed. Long weighted speculum was removed and replaced with a short 3 and speculum in the and the peritoneum closed the taking the vesicouterine reflection of peritoneum and placing it in attachment to the posterior cul-de-sac. 2-0 chromic was used. Prior to placing this 2-0 chromic suture, a suture of 2-0 Prolene was used on the medial aspect of the uterosacral ligament to attach some of the cul-de-sac to the medial aspect of the uterosacral ligament to reduce the potential for future enterocele. Sutures did not cross the midline. Anterior repair.: 2 Allis clamps were used to grasp the anterior vaginal mucosa. Foley catheter was inserted and generous clear urine evacuated and Foley catheter bag placed on the patient's abdomen. Marcaine was injected beneath the anterior mucosa under the cystocele and the vaginal epithelium split in the midline and easily dissected off of the underlying bladder without difficulty. Dissection down to just above the trigone and the urethral support was actually quite good. Horizontal mattress 0 Vicryl sutures x4 were used to reduce the cystocele. Redundant vaginal epithelium was trimmed, then the epithelium reapproximated in the midline with interrupted 2-0 chromic, then  the vaginal cuff closed  with front to back with 0 chromic interrupted sutures. Hemostasis was good.  Posterior repair: Posterior perineal body was grasped at the level of the hymen remnants, Marcaine injected and then the posterior epithelium split in the midline for a distance of 3 cm of posterior wall. Lateral dissection was easily performed and Allis clamps could be used to grasp supportive tissue on each lateral side which was then pulled into the midline with 3 interrupted sutures of 0 Vicryl. Good perineal support was accomplished with appropriate amount of ketones. Vaginal epithelium was trimmed, pulled together with interrupted 2 chromic and vaginal packing inserted without difficulty. Sponge and needle counts were correct patient to recovery in good condition. End of dictation  PLAN OF CARE: Admit for overnight observation  PATIENT DISPOSITION:  PACU - hemodynamically stable.   Delay start of Pharmacological VTE agent (>24hrs) due to surgical blood loss or risk of bleeding: not applicable

## 2012-05-17 NOTE — Transfer of Care (Addendum)
Immediate Anesthesia Transfer of Care Note  Patient: Wanda [name]  Procedure(s) Performed: Procedure(s) (LRB): HYSTERECTOMY VAGINAL (N/A) ANTERIOR (CYSTOCELE) AND POSTERIOR REPAIR (RECTOCELE) (N/A) SALPINGO OOPHERECTOMY (Bilateral)  Patient Location: PACU  Anesthesia Type: SAB  Level of Consciousness: awake  Airway & Oxygen Therapy: Patient Spontanous Breathing and non-rebreather face mask  Post-op Assessment: Report given to PACU RN, Post -op Vital signs reviewed and stable. SAB Level  T 12  Post vital signs: Reviewed and stable  Complications: No apparent anesthesia complications

## 2012-05-17 NOTE — Anesthesia Preprocedure Evaluation (Signed)
Anesthesia Evaluation  Patient identified by MRN, date of birth, ID band Patient awake    Reviewed: Allergy & Precautions, H&P , NPO status , Patient's Chart, lab work & pertinent test results  History of Anesthesia Complications Negative for: history of anesthetic complications  Airway Mallampati: I      Dental  (+) Teeth Intact   Pulmonary neg pulmonary ROS,  breath sounds clear to auscultation        Cardiovascular hypertension, Pt. on medications Rhythm:Regular     Neuro/Psych    GI/Hepatic GERD-  Medicated,  Endo/Other    Renal/GU      Musculoskeletal   Abdominal   Peds  Hematology   Anesthesia Other Findings   Reproductive/Obstetrics                           Anesthesia Physical Anesthesia Plan  ASA: II  Anesthesia Plan: Spinal   Post-op Pain Management:    Induction:   Airway Management Planned: Nasal Cannula  Additional Equipment:   Intra-op Plan:   Post-operative Plan:   Informed Consent: I have reviewed the patients History and Physical, chart, labs and discussed the procedure including the risks, benefits and alternatives for the proposed anesthesia with the patient or authorized representative who has indicated his/her understanding and acceptance.     Plan Discussed with:   Anesthesia Plan Comments:         Anesthesia Quick Evaluation

## 2012-05-17 NOTE — Op Note (Signed)
See the operative details included in the brief operative note 

## 2012-05-18 ENCOUNTER — Encounter (HOSPITAL_COMMUNITY): Payer: Self-pay | Admitting: Obstetrics and Gynecology

## 2012-05-18 LAB — BASIC METABOLIC PANEL
BUN: 9 mg/dL (ref 6–23)
CO2: 22 mEq/L (ref 19–32)
Calcium: 8.2 mg/dL — ABNORMAL LOW (ref 8.4–10.5)
Chloride: 108 mEq/L (ref 96–112)
Creatinine, Ser: 0.68 mg/dL (ref 0.50–1.10)
GFR calc Af Amer: >90 mL/min (ref >90)
GFR calc non Af Amer: 82 mL/min — ABNORMAL LOW (ref >90)
Glucose, Bld: 100 mg/dL — ABNORMAL HIGH (ref 70–99)
Potassium: 3.6 mEq/L (ref 3.5–5.1)
Sodium: 138 mEq/L (ref 135–145)

## 2012-05-18 LAB — CBC
HCT: 28.2 % — ABNORMAL LOW (ref 36.0–46.0)
Hemoglobin: 9.5 g/dL — ABNORMAL LOW (ref 12.0–15.0)
MCH: 30.3 pg (ref 26.0–34.0)
MCHC: 33.7 g/dL (ref 30.0–36.0)
MCV: 89.8 fL (ref 78.0–100.0)
Platelets: 218 10*3/uL (ref 150–400)
RBC: 3.14 MIL/uL — ABNORMAL LOW (ref 3.87–5.11)
RDW: 15.2 % (ref 11.5–15.5)
WBC: 11 10*3/uL — ABNORMAL HIGH (ref 4.0–10.5)

## 2012-05-18 NOTE — Anesthesia Postprocedure Evaluation (Signed)
Anesthesia Post Note  Patient: Wanda [name]  Procedure(s) Performed: Procedure(s) (LRB): HYSTERECTOMY VAGINAL (N/A) ANTERIOR (CYSTOCELE) AND POSTERIOR REPAIR (RECTOCELE) (N/A) SALPINGO OOPHERECTOMY (Bilateral)  Anesthesia type: Spinal  tient location: 307  Post pain: Pain level controlled  Post assessment: Post-op Vital signs reviewed, Patient's Cardiovascular Status Stable, Respiratory Function Stable, Patent Airway, No signs of Nausea or vomiting and Pain level controlled  post vital signs: Reviewed and stable  Level of consciousness: awake and alert   Complications: No apparent anesthesia complications

## 2012-05-18 NOTE — Care Management Note (Signed)
    Page 1 of 1   05/18/2012     10:39:51 AM   CARE MANAGEMENT NOTE 05/18/2012  Patient:  Wanda [name], Wanda [name]   Account Number:  [id number]  Date Initiated:  05/18/2012  Documentation initiated by:  Sharrie Rothman  Subjective/Objective Assessment:   Pt admitted from home s/p vag hysterectomy. Pt lives alone and will return home at discharge. Pt is independent with ADL's.     Action/Plan:   No CM needs noted.   Anticipated DC Date:  05/18/2012   Anticipated DC Plan:  HOME/SELF CARE      DC Planning Services  CM consult      Choice offered to / List presented to:             Status of service:  Completed, signed off Medicare Important Message given?   (If response is "NO", the following Medicare IM given date fields will be blank) Date Medicare IM given:   Date Additional Medicare IM given:    Discharge Disposition:    Per UR Regulation:    If discussed at Long Length of Stay Meetings, dates discussed:    Comments:  05/18/12 1039 Arlyss Queen, RN BSN CM

## 2012-05-18 NOTE — Addendum Note (Signed)
Addendum  created 05/18/12 0834 by Franco Nones, CRNA   Modules edited:Notes Section

## 2012-05-18 NOTE — Progress Notes (Signed)
Patient received discharge instructions along with follow up appointments and prescriptions. Patient verbalized understanding of all instructions. Patient was escorted by staff via wheelchair to vehicle. Patient discharged to home in stable condition. 

## 2012-05-18 NOTE — Discharge Summary (Signed)
Physician Discharge Summary  Patient ID: Wanda [name] MRN: [id number] DOB/AGE: [id number] 76 y.o.  Admit date: 05/17/2012 Discharge date: 05/18/2012  Admission Diagnoses: Cystocele and rectocele with incomplete uterine prolapse  Discharge Diagnoses:  Principal Problem:  *Cystocele or rectocele with incomplete uterine prolapse   Discharged Condition: good  Hospital Course: Wanda [name] is an 78 y.o. female. She is admitted for a vaginal hysterectomy anterior and posterior repair. Ovaries and tubes will we removed if accessible. She has been followed at [facility] for many years for uterine prolapse treated with a ring pessary, but has been developed urge incontinence symptoms possibly attributable to the pessary effect.  She has had an endometrial biopsy that was benign. She has had recent anemia, there was recurrent and had referral to GI where colonoscopy was performed and was negative Pap smear recently performed 2/ 28 2013 was benign Patient underwent vaginal hysterectomy with bilateral salpingo-oophorectomy anterior and posterior repair with 100 cc EBL on July 2  Consults: None  Significant Diagnostic Studies: labs:  CBC    Component Value Date/Time   WBC 11.0* 05/18/2012 0500   RBC 3.14* 05/18/2012 0500   HGB 9.5* 05/18/2012 0500   HCT 28.2* 05/18/2012 0500   PLT 218 05/18/2012 0500   MCV 89.8 05/18/2012 0500   MCH 30.3 05/18/2012 0500   MCHC 33.7 05/18/2012 0500   RDW 15.2 05/18/2012 0500   LYMPHSABS 3.0 06/18/2011 1255   MONOABS 0.5 06/18/2011 1255   EOSABS 0.2 06/18/2011 1255   BASOSABS 0.0 06/18/2011 1255    ]  Treatments: surgery: Vaginal hysterectomy with bilateral salpingo-oophorectomy anterior and posterior repair  Discharge Exam: Blood pressure 94/58, pulse 63, temperature 98.6 F (37 C), temperature source Oral, resp. rate 18, height 5' (1.524 m), weight 61.3 kg (135 lb 2.3 oz), SpO2 94.00%. Abdomen: Soft nontender without masses tolerating diet well Pelvic:  Vaginal packing and Foley catheter removed General: Denies pain of any sort at this time desires to go home ambulatory without difficulty. Blood pressures low but tolerated pulse in the 60s  Disposition: 01-Home or Self Care   Medication List  As of 05/18/2012  8:45 AM   ASK your doctor about these medications         cholecalciferol 1000 UNITS tablet   Commonly known as: VITAMIN D   Take 1,000 Units by mouth daily.      docusate sodium 100 MG capsule   Commonly known as: COLACE   Take 100 mg by mouth as needed. For congestion      HYDROcodone-acetaminophen 5-325 MG per tablet   Commonly known as: NORCO   Take 1 tablet by mouth every 6 (six) hours as needed. For severe pain      ibuprofen 200 MG tablet   Commonly known as: ADVIL,MOTRIN   Take 600 mg by mouth every 6 (six) hours as needed. For pain      INTEGRA 62.5-62.5-40-3 MG Caps   Take 1 capsule by mouth daily.      lisinopril 20 MG tablet   Commonly known as: PRINIVIL,ZESTRIL   Take 20 mg by mouth daily.             SignedTilda Burrow 05/18/2012, 8:45 AM

## 2012-05-23 LAB — TYPE AND SCREEN
ABO/RH(D): O POS
Antibody Screen: NEGATIVE
Unit division: 0
Unit division: 0

## 2013-02-06 ENCOUNTER — Other Ambulatory Visit (HOSPITAL_COMMUNITY): Payer: Self-pay | Admitting: Nurse Practitioner

## 2013-02-06 DIAGNOSIS — R911 Solitary pulmonary nodule: Secondary | ICD-10-CM

## 2013-02-08 ENCOUNTER — Ambulatory Visit (HOSPITAL_COMMUNITY): Payer: Medicare Other

## 2013-02-27 ENCOUNTER — Ambulatory Visit (HOSPITAL_COMMUNITY)
Admission: RE | Admit: 2013-02-27 | Discharge: 2013-02-27 | Disposition: A | Discharge status: Home / Self Care | Payer: Medicare Other | Source: Ambulatory Visit | Attending: Nurse Practitioner | Admitting: Nurse Practitioner

## 2013-02-27 DIAGNOSIS — R911 Solitary pulmonary nodule: Secondary | ICD-10-CM | POA: Insufficient documentation

## 2013-02-27 DIAGNOSIS — K7689 Other specified diseases of liver: Secondary | ICD-10-CM | POA: Insufficient documentation

## 2013-02-27 DIAGNOSIS — Z09 Encounter for follow-up examination after completed treatment for conditions other than malignant neoplasm: Secondary | ICD-10-CM | POA: Insufficient documentation

## 2013-02-27 MED ORDER — IOHEXOL 300 MG/ML  SOLN
80.0000 mL | Freq: Once | INTRAMUSCULAR | Status: AC | PRN
  Administered 2013-02-27: 80 mL via INTRAVENOUS

## 2013-05-31 ENCOUNTER — Ambulatory Visit (INDEPENDENT_AMBULATORY_CARE_PROVIDER_SITE_OTHER): Payer: Medicare Other | Admitting: Obstetrics and Gynecology

## 2013-05-31 ENCOUNTER — Encounter: Payer: Self-pay | Admitting: Obstetrics and Gynecology

## 2013-05-31 VITALS — BP 160/84 | Ht 61.0 in | Wt 134.0 lb

## 2013-05-31 DIAGNOSIS — Z90710 Acquired absence of both cervix and uterus: Secondary | ICD-10-CM

## 2013-05-31 DIAGNOSIS — Z9889 Other specified postprocedural states: Secondary | ICD-10-CM

## 2013-05-31 NOTE — Patient Instructions (Signed)
No further care here. Have primary care do rectal and breast chk.

## 2013-05-31 NOTE — Progress Notes (Signed)
Patient ID: Wanda [name], female   DOB: [birth year], 79 y.o.   MRN: [id number] Pt here today for a one year follow up after a hysterectomy. Pt states she has not had any problems. Pt denies any issues or concerns at this time.  Physical Examination: General appearance - alert, well appearing, and in no distress Abdomen - soft, nontender, nondistended, no masses or organomegaly Pelvic - good support ant post and apical

## 2013-07-02 IMAGING — CR DG WRIST COMPLETE 3+V*R*
2 series · 2 of 2 positions shown · non-contrast
Comparison: None.

CLINICAL DATA: 78-year-old female status post fall with pain and
swelling.

RIGHT WRIST - COMPLETE 3+ VIEW

[view not recorded (1 of 2)]
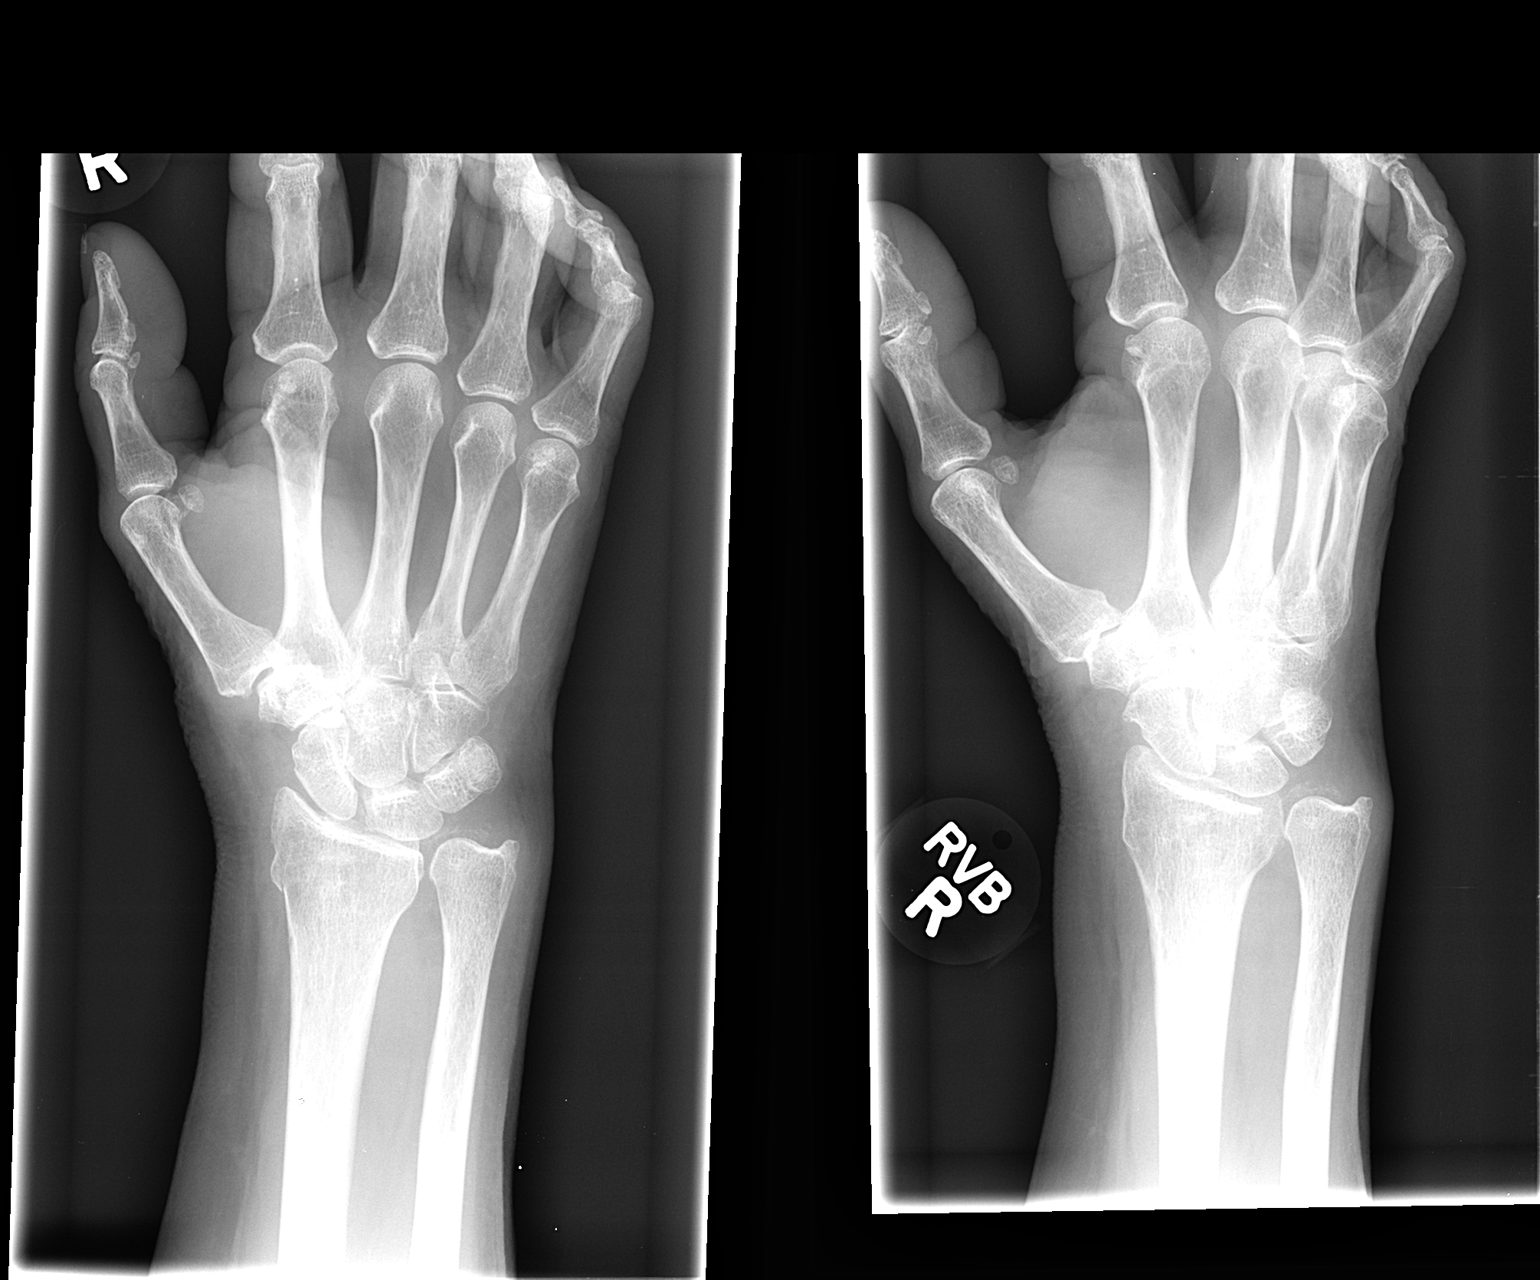

[view not recorded (2 of 2)]
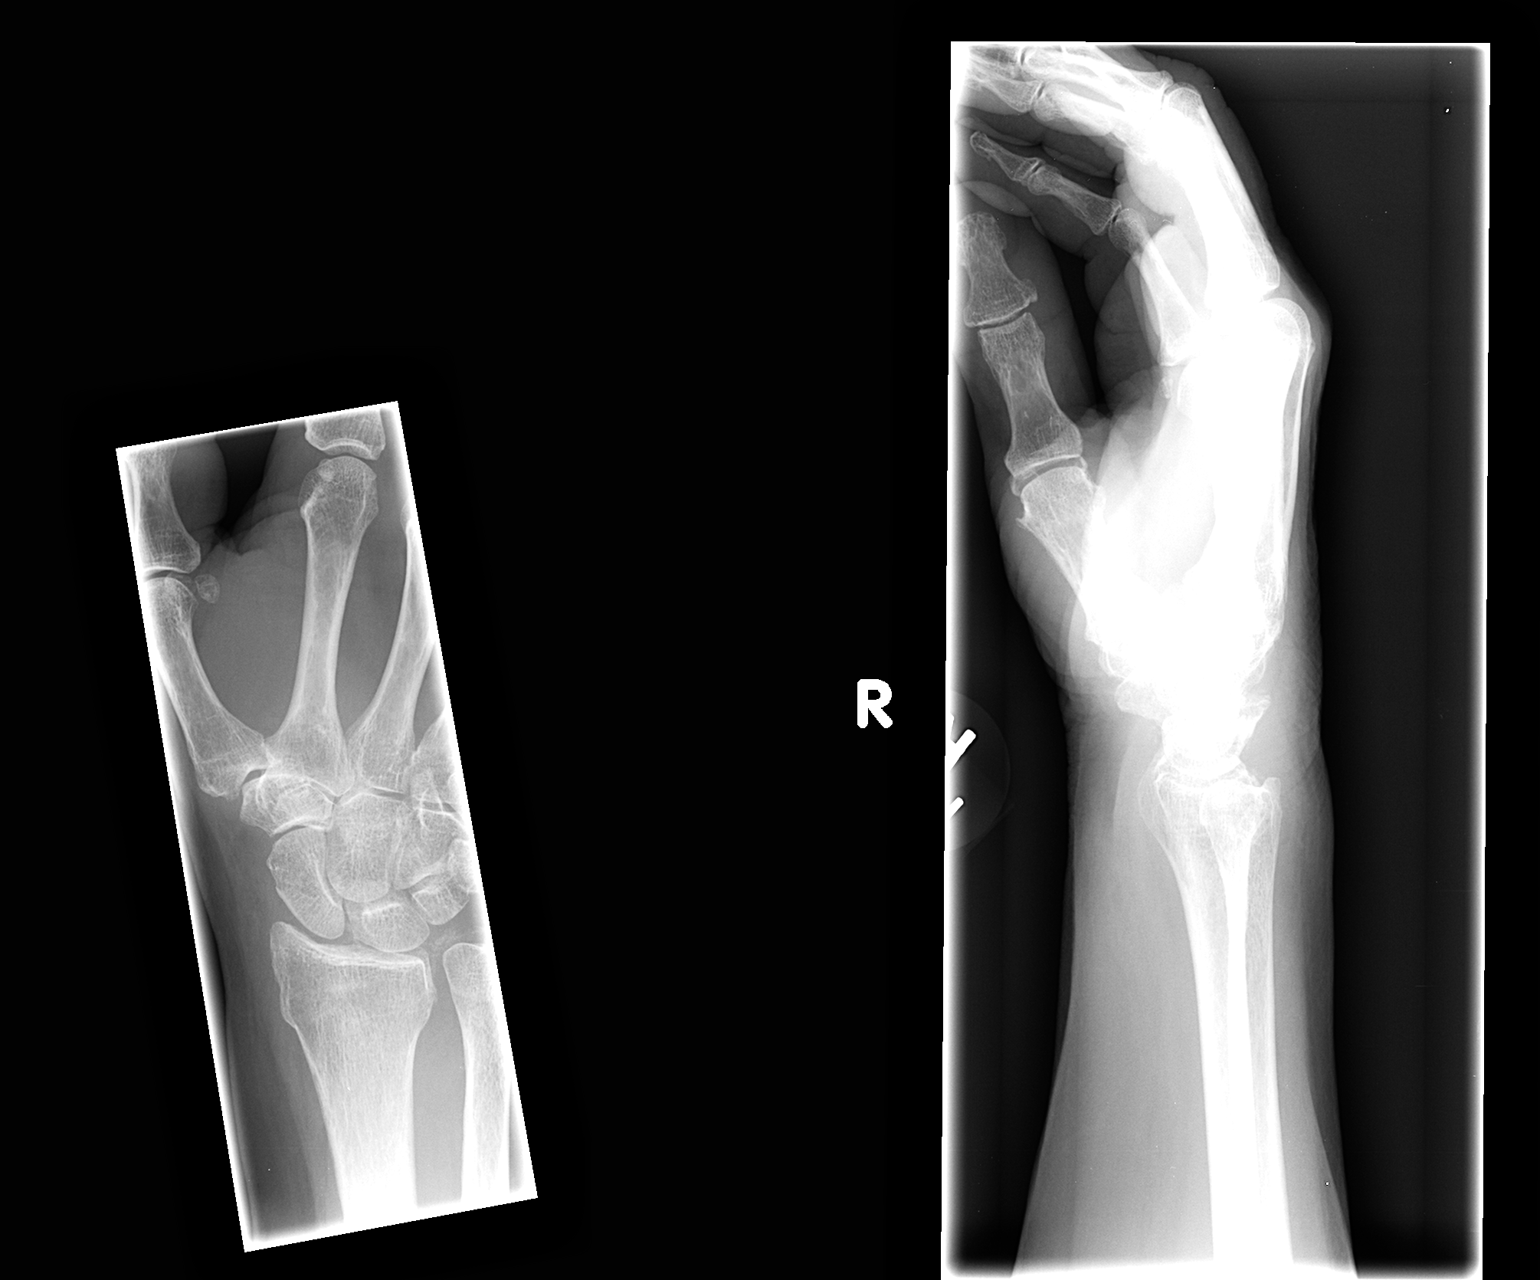

[2 of 2 positions shown; findings below may reference images not displayed]

FINDINGS: Impacted distal right radius fracture has a torus
configuration.  No significant displacement or angulation.  The
fracture line does extend to the distal radial ulnar joint.  No
definite radiocarpal joint involvement.  Incidental
chondrocalcinosis which can be seen in the setting of calcium
pyrophosphate deposition disease..  Carpal bone alignment is
preserved.  Scaphoid appears intact.  Visualized osseous structures
in the right hand appear intact.
IMPRESSION: Torus type impacted distal right radius fracture, nondisplaced.

## 2013-10-02 ENCOUNTER — Encounter (HOSPITAL_COMMUNITY)
Admission: EM | Disposition: A | Discharge status: Home / Self Care | Payer: Self-pay | Source: Home / Self Care | Attending: Internal Medicine

## 2013-10-02 ENCOUNTER — Inpatient Hospital Stay (HOSPITAL_COMMUNITY)
Admission: EM | Admit: 2013-10-02 | Discharge: 2013-10-05 | DRG: 378 | Disposition: A | Discharge status: Home / Self Care | Payer: Medicare Other | Attending: Internal Medicine | Admitting: Internal Medicine

## 2013-10-02 ENCOUNTER — Encounter (HOSPITAL_COMMUNITY): Payer: Self-pay | Admitting: Emergency Medicine

## 2013-10-02 DIAGNOSIS — R000 Tachycardia, unspecified: Secondary | ICD-10-CM

## 2013-10-02 DIAGNOSIS — K921 Melena: Secondary | ICD-10-CM

## 2013-10-02 DIAGNOSIS — R1110 Vomiting, unspecified: Secondary | ICD-10-CM

## 2013-10-02 DIAGNOSIS — R51 Headache: Secondary | ICD-10-CM | POA: Diagnosis present

## 2013-10-02 DIAGNOSIS — R195 Other fecal abnormalities: Secondary | ICD-10-CM

## 2013-10-02 DIAGNOSIS — D62 Acute posthemorrhagic anemia: Secondary | ICD-10-CM | POA: Diagnosis present

## 2013-10-02 DIAGNOSIS — K317 Polyp of stomach and duodenum: Secondary | ICD-10-CM

## 2013-10-02 DIAGNOSIS — M7501 Adhesive capsulitis of right shoulder: Secondary | ICD-10-CM

## 2013-10-02 DIAGNOSIS — K259 Gastric ulcer, unspecified as acute or chronic, without hemorrhage or perforation: Secondary | ICD-10-CM

## 2013-10-02 DIAGNOSIS — K219 Gastro-esophageal reflux disease without esophagitis: Secondary | ICD-10-CM

## 2013-10-02 DIAGNOSIS — E860 Dehydration: Secondary | ICD-10-CM | POA: Diagnosis present

## 2013-10-02 DIAGNOSIS — K319 Disease of stomach and duodenum, unspecified: Secondary | ICD-10-CM | POA: Diagnosis present

## 2013-10-02 DIAGNOSIS — K2961 Other gastritis with bleeding: Principal | ICD-10-CM | POA: Diagnosis present

## 2013-10-02 DIAGNOSIS — Z23 Encounter for immunization: Secondary | ICD-10-CM

## 2013-10-02 DIAGNOSIS — K2960 Other gastritis without bleeding: Secondary | ICD-10-CM

## 2013-10-02 DIAGNOSIS — D509 Iron deficiency anemia, unspecified: Secondary | ICD-10-CM

## 2013-10-02 DIAGNOSIS — K5900 Constipation, unspecified: Secondary | ICD-10-CM | POA: Diagnosis present

## 2013-10-02 DIAGNOSIS — D649 Anemia, unspecified: Secondary | ICD-10-CM

## 2013-10-02 DIAGNOSIS — G4730 Sleep apnea, unspecified: Secondary | ICD-10-CM | POA: Diagnosis present

## 2013-10-02 DIAGNOSIS — K5730 Diverticulosis of large intestine without perforation or abscess without bleeding: Secondary | ICD-10-CM | POA: Diagnosis present

## 2013-10-02 DIAGNOSIS — E876 Hypokalemia: Secondary | ICD-10-CM | POA: Diagnosis present

## 2013-10-02 DIAGNOSIS — D131 Benign neoplasm of stomach: Secondary | ICD-10-CM | POA: Diagnosis present

## 2013-10-02 DIAGNOSIS — Z8249 Family history of ischemic heart disease and other diseases of the circulatory system: Secondary | ICD-10-CM

## 2013-10-02 DIAGNOSIS — K449 Diaphragmatic hernia without obstruction or gangrene: Secondary | ICD-10-CM

## 2013-10-02 DIAGNOSIS — K922 Gastrointestinal hemorrhage, unspecified: Secondary | ICD-10-CM

## 2013-10-02 DIAGNOSIS — K920 Hematemesis: Secondary | ICD-10-CM

## 2013-10-02 DIAGNOSIS — I10 Essential (primary) hypertension: Secondary | ICD-10-CM

## 2013-10-02 DIAGNOSIS — Z807 Family history of other malignant neoplasms of lymphoid, hematopoietic and related tissues: Secondary | ICD-10-CM

## 2013-10-02 DIAGNOSIS — K5790 Diverticulosis of intestine, part unspecified, without perforation or abscess without bleeding: Secondary | ICD-10-CM

## 2013-10-02 DIAGNOSIS — M1990 Unspecified osteoarthritis, unspecified site: Secondary | ICD-10-CM | POA: Diagnosis present

## 2013-10-02 HISTORY — DX: Polyp of stomach and duodenum: K31.7

## 2013-10-02 HISTORY — DX: Diaphragmatic hernia without obstruction or gangrene: K44.9

## 2013-10-02 HISTORY — PX: ESOPHAGOGASTRODUODENOSCOPY: SHX5428

## 2013-10-02 HISTORY — DX: Gastric ulcer, unspecified as acute or chronic, without hemorrhage or perforation: K25.9

## 2013-10-02 LAB — COMPREHENSIVE METABOLIC PANEL
ALT: 16 U/L (ref 0–35)
AST: 20 U/L (ref 0–37)
Albumin: 3.2 g/dL — ABNORMAL LOW (ref 3.5–5.2)
Alkaline Phosphatase: 76 U/L (ref 39–117)
BUN: 41 mg/dL — ABNORMAL HIGH (ref 6–23)
CO2: 22 mEq/L (ref 19–32)
Calcium: 8.8 mg/dL (ref 8.4–10.5)
Chloride: 109 mEq/L (ref 96–112)
Creatinine, Ser: 0.61 mg/dL (ref 0.50–1.10)
GFR calc Af Amer: >90 mL/min (ref >90)
GFR calc non Af Amer: 85 mL/min — ABNORMAL LOW (ref >90)
Glucose, Bld: 136 mg/dL — ABNORMAL HIGH (ref 70–99)
Potassium: 4.2 mEq/L (ref 3.5–5.1)
Sodium: 141 mEq/L (ref 135–145)
Total Bilirubin: 0.6 mg/dL (ref 0.3–1.2)
Total Protein: 6.3 g/dL (ref 6.0–8.3)

## 2013-10-02 LAB — CBC
HCT: 24.2 % — ABNORMAL LOW (ref 36.0–46.0)
Hemoglobin: 7.8 g/dL — ABNORMAL LOW (ref 12.0–15.0)
MCH: 27.9 pg (ref 26.0–34.0)
MCHC: 32.2 g/dL (ref 30.0–36.0)
MCV: 86.4 fL (ref 78.0–100.0)
Platelets: 272 10*3/uL (ref 150–400)
RBC: 2.8 MIL/uL — ABNORMAL LOW (ref 3.87–5.11)
RDW: 14.8 % (ref 11.5–15.5)
WBC: 12.8 10*3/uL — ABNORMAL HIGH (ref 4.0–10.5)

## 2013-10-02 LAB — CBC WITH DIFFERENTIAL/PLATELET
Basophils Absolute: 0 10*3/uL (ref 0.0–0.1)
Basophils Relative: 0 % (ref 0–1)
Eosinophils Absolute: 0 10*3/uL (ref 0.0–0.7)
Eosinophils Relative: 0 % (ref 0–5)
HCT: 18 % — ABNORMAL LOW (ref 36.0–46.0)
Hemoglobin: 5.3 g/dL — CL (ref 12.0–15.0)
Lymphocytes Relative: 11 % — ABNORMAL LOW (ref 12–46)
Lymphs Abs: 1.1 10*3/uL (ref 0.7–4.0)
MCH: 25.7 pg — ABNORMAL LOW (ref 26.0–34.0)
MCHC: 29.4 g/dL — ABNORMAL LOW (ref 30.0–36.0)
MCV: 87.4 fL (ref 78.0–100.0)
Monocytes Absolute: 0.3 10*3/uL (ref 0.1–1.0)
Monocytes Relative: 3 % (ref 3–12)
Neutro Abs: 8.2 10*3/uL — ABNORMAL HIGH (ref 1.7–7.7)
Neutrophils Relative %: 86 % — ABNORMAL HIGH (ref 43–77)
Platelets: 380 10*3/uL (ref 150–400)
RBC: 2.06 MIL/uL — ABNORMAL LOW (ref 3.87–5.11)
RDW: 16.3 % — ABNORMAL HIGH (ref 11.5–15.5)
WBC: 9.6 10*3/uL (ref 4.0–10.5)

## 2013-10-02 LAB — OCCULT BLOOD, POC DEVICE: Fecal Occult Bld: POSITIVE — AB

## 2013-10-02 LAB — MRSA PCR SCREENING: MRSA by PCR: NEGATIVE

## 2013-10-02 LAB — PREPARE RBC (CROSSMATCH)

## 2013-10-02 SURGERY — EGD (ESOPHAGOGASTRODUODENOSCOPY)
Anesthesia: Moderate Sedation

## 2013-10-02 MED ORDER — ACETAMINOPHEN 650 MG RE SUPP: 650.0000 mg | Freq: Four times a day (QID) | RECTAL | Status: DC | PRN

## 2013-10-02 MED ORDER — MIDAZOLAM HCL 5 MG/5ML IJ SOLN
INTRAMUSCULAR | Status: DC | PRN
  Administered 2013-10-02: 1 mg via INTRAVENOUS
  Administered 2013-10-02: 2 mg via INTRAVENOUS
  Administered 2013-10-02 (×2): 1 mg via INTRAVENOUS

## 2013-10-02 MED ORDER — ONDANSETRON HCL 4 MG/2ML IJ SOLN
INTRAMUSCULAR | Status: AC
  Administered 2013-10-02: 4 mg via INTRAVENOUS
  Filled 2013-10-02: qty 2

## 2013-10-02 MED ORDER — SUCRALFATE 1 GM/10ML PO SUSP
1.0000 g | Freq: Three times a day (TID) | ORAL | Status: DC
  Administered 2013-10-02 – 2013-10-05 (×10): 1 g via ORAL
  Filled 2013-10-02 (×10): qty 10

## 2013-10-02 MED ORDER — MIDAZOLAM HCL 5 MG/5ML IJ SOLN
INTRAMUSCULAR | Status: AC
  Filled 2013-10-02: qty 10

## 2013-10-02 MED ORDER — PNEUMOCOCCAL VAC POLYVALENT 25 MCG/0.5ML IJ INJ
0.5000 mL | INJECTION | INTRAMUSCULAR | Status: AC
  Administered 2013-10-03: 0.5 mL via INTRAMUSCULAR
  Filled 2013-10-02: qty 0.5

## 2013-10-02 MED ORDER — PANTOPRAZOLE SODIUM 40 MG IV SOLR: 40.0000 mg | Freq: Two times a day (BID) | INTRAVENOUS | Status: DC

## 2013-10-02 MED ORDER — ALUM & MAG HYDROXIDE-SIMETH 200-200-20 MG/5ML PO SUSP: 30.0000 mL | Freq: Four times a day (QID) | ORAL | Status: DC | PRN

## 2013-10-02 MED ORDER — SODIUM CHLORIDE 0.9 % IV BOLUS (SEPSIS): 1000.0000 mL | Freq: Once | INTRAVENOUS | Status: DC

## 2013-10-02 MED ORDER — FERROUS SULFATE 325 (65 FE) MG PO TABS
325.0000 mg | ORAL_TABLET | ORAL | Status: DC
  Administered 2013-10-02 – 2013-10-04 (×2): 325 mg via ORAL
  Filled 2013-10-02 (×3): qty 1

## 2013-10-02 MED ORDER — MORPHINE SULFATE 2 MG/ML IJ SOLN: 1.0000 mg | INTRAMUSCULAR | Status: DC | PRN

## 2013-10-02 MED ORDER — MEPERIDINE HCL 100 MG/ML IJ SOLN
INTRAMUSCULAR | Status: AC
  Filled 2013-10-02: qty 2

## 2013-10-02 MED ORDER — ONDANSETRON HCL 4 MG/2ML IJ SOLN: 4.0000 mg | Freq: Once | INTRAMUSCULAR | Status: DC

## 2013-10-02 MED ORDER — ONDANSETRON HCL 4 MG/2ML IJ SOLN
INTRAMUSCULAR | Status: AC
  Filled 2013-10-02: qty 2

## 2013-10-02 MED ORDER — PANTOPRAZOLE SODIUM 40 MG IV SOLR
40.0000 mg | Freq: Once | INTRAVENOUS | Status: AC
  Administered 2013-10-02: 40 mg via INTRAVENOUS
  Filled 2013-10-02: qty 40

## 2013-10-02 MED ORDER — FERROUS SULFATE 27 MG PO TABS: 1.0000 | ORAL_TABLET | ORAL | Status: DC

## 2013-10-02 MED ORDER — ONDANSETRON HCL 4 MG/2ML IJ SOLN
INTRAMUSCULAR | Status: DC | PRN
  Administered 2013-10-02: 4 mg via INTRAMUSCULAR

## 2013-10-02 MED ORDER — ONDANSETRON HCL 4 MG PO TABS: 4.0000 mg | ORAL_TABLET | Freq: Four times a day (QID) | ORAL | Status: DC | PRN

## 2013-10-02 MED ORDER — ONDANSETRON HCL 4 MG/2ML IJ SOLN
4.0000 mg | Freq: Once | INTRAMUSCULAR | Status: AC
  Administered 2013-10-02: 4 mg via INTRAVENOUS

## 2013-10-02 MED ORDER — BUTAMBEN-TETRACAINE-BENZOCAINE 2-2-14 % EX AERO
INHALATION_SPRAY | CUTANEOUS | Status: DC | PRN
  Administered 2013-10-02: 2 via TOPICAL

## 2013-10-02 MED ORDER — INFLUENZA VAC SPLIT QUAD 0.5 ML IM SUSP
0.5000 mL | INTRAMUSCULAR | Status: AC
  Administered 2013-10-03: 0.5 mL via INTRAMUSCULAR
  Filled 2013-10-02: qty 0.5

## 2013-10-02 MED ORDER — SODIUM CHLORIDE 0.9 % IV SOLN
INTRAVENOUS | Status: DC
  Administered 2013-10-02 (×2): via INTRAVENOUS

## 2013-10-02 MED ORDER — MEPERIDINE HCL 100 MG/ML IJ SOLN
INTRAMUSCULAR | Status: DC | PRN
  Administered 2013-10-02 (×2): 25 mg via INTRAVENOUS

## 2013-10-02 MED ORDER — ONDANSETRON HCL 4 MG/2ML IJ SOLN: 4.0000 mg | Freq: Four times a day (QID) | INTRAMUSCULAR | Status: DC | PRN

## 2013-10-02 MED ORDER — ACETAMINOPHEN 325 MG PO TABS
650.0000 mg | ORAL_TABLET | Freq: Four times a day (QID) | ORAL | Status: DC | PRN
  Administered 2013-10-03 – 2013-10-05 (×4): 650 mg via ORAL
  Filled 2013-10-02 (×4): qty 2

## 2013-10-02 MED ORDER — PANTOPRAZOLE SODIUM 40 MG IV SOLR
40.0000 mg | Freq: Two times a day (BID) | INTRAVENOUS | Status: DC
  Administered 2013-10-02: 40 mg via INTRAVENOUS
  Filled 2013-10-02: qty 40

## 2013-10-02 MED ORDER — SODIUM CHLORIDE 0.9 % IV SOLN: INTRAVENOUS | Status: DC

## 2013-10-02 NOTE — ED Notes (Signed)
CRITICAL VALUE ALERT  Critical value received:  Hgb 5.3  Date of notification:  10/02/13  Time of notification:  1214  Critical value read back:yes  Nurse who received alert:  lrt  MD notified (1st page): [place]  Time of first page:  1215  MD notified (2nd page):  Time of second page:  Responding MD:  wickline  Time MD responded: 1215

## 2013-10-02 NOTE — ED Provider Notes (Signed)
CSN: [id number]     Arrival date & time 10/02/13  1111 History  This chart was scribed for Joya Gaskins, MD by Bennett Scrape, ED Scribe. This patient was seen in room APA05/[facility]05 and the patient's care was started at 12:18 PM.   CC: Hematemesis  Patient is a 79 y.o. female presenting with vomiting. The history is provided by the patient. No language interpreter was used.  Emesis Duration: this morning. Number of daily episodes:  1 Emesis appearance: black. Chronicity:  New Context: not post-tussive   Associated symptoms: chills   Associated symptoms: no diarrhea     HPI Comments: Wanda [name] is a 79 y.o. female with a h/o IDA who presents to the Emergency Department complaining of one episode of hematemesis described as black with one associated episode of melena and ongoing dizziness and chills that started this morning. Pt states that she woke up feeling nauseated around 1 AM and had a subsequent episode of black stool. She is currently on iron tablets and states that the black stool did not concern her at the time. She then attempted to go back to sleep only to be awoken again by her nausea. She then had a sequent "solid black" episode of emesis. She denies any prior episodes. She denies that she is currently being on any anticoagulants. She denies being on any Naprosyn or other NSAIDs daily. She denies having a h/io bleeding ulcer before. She has had an extensive GI work up for her h/o anemia with no diagnosis. She denies having a h/o cardiac surgeries. She denies any fever, CP, SOB, abdominal pain or vaginal bleeding.   GI is Dr. Kendell Bane   Past Medical History  Diagnosis Date  . IDA (iron deficiency anemia)   . GERD (gastroesophageal reflux disease)   . Gastritis 12/08/10    EGD by Dr. Nancy Fetter hiatal hernia, duodenal diverticulum, chronic gastritis  . Diverticulosis 10/20/10    Left-sided on colonoscopy by Dr. Jena Gauss 10/20/10  . HTN (hypertension)   . Vitamin D  deficiency   . Osteoarthritis   . Heart murmur   . Sleep apnea     STOP BANG SCORE 4  . Cystocele   . Urge incontinence   . Radial fracture     (right) Undergoing treatment by Dr. Romeo Apple currently   Past Surgical History  Procedure Laterality Date  . Colonoscopy  02/29/2012    Procedure: COLONOSCOPY;  Surgeon: Corbin Ade, MD;  Location: [facility];  Service: Endoscopy;  Laterality: N/A;  12:15  . Esophagogastroduodenoscopy  02/29/2012    Procedure: ESOPHAGOGASTRODUODENOSCOPY (EGD);  Surgeon: Corbin Ade, MD;  Location: [facility];  Service: Endoscopy;  Laterality: N/A;  . Givens capsule study  02/29/2012    Procedure: GIVENS CAPSULE STUDY;  Surgeon: Corbin Ade, MD;  Location: [facility];  Service: Endoscopy;  Laterality: N/A;  . Vaginal hysterectomy  05/17/2012    Procedure: HYSTERECTOMY VAGINAL;  Surgeon: Tilda Burrow, MD;  Location: [facility];  Service: Gynecology;  Laterality: N/A;  . Anterior and posterior repair  05/17/2012    Procedure: ANTERIOR (CYSTOCELE) AND POSTERIOR REPAIR (RECTOCELE);  Surgeon: Tilda Burrow, MD;  Location: [facility];  Service: Gynecology;  Laterality: N/A;  . Salpingoophorectomy  05/17/2012    Procedure: SALPINGO OOPHERECTOMY;  Surgeon: Tilda Burrow, MD;  Location: [facility];  Service: Gynecology;  Laterality: Bilateral;   Family History  Problem Relation Age of Onset  . Hypertension Mother   . Lymphoma  Brother   . Anesthesia problems Neg Hx   . Hypotension Neg Hx   . Malignant hyperthermia Neg Hx   . Pseudochol deficiency Neg Hx    History  Substance Use Topics  . Smoking status: Never Smoker   . Smokeless tobacco: Never Used  . Alcohol Use: No   No OB history provided.  Review of Systems  Constitutional: Positive for chills. Negative for fever.  Respiratory: Negative for shortness of breath.   Cardiovascular: Negative for chest pain.  Gastrointestinal: Positive for nausea, vomiting and blood in stool (black). Negative for  diarrhea.  Genitourinary: Negative for vaginal bleeding.  Neurological: Positive for dizziness.  All other systems reviewed and are negative.    Allergies  Naproxen sodium  Home Medications   Current Outpatient Rx  Name  Route  Sig  Dispense  Refill  . cholecalciferol (VITAMIN D) 1000 UNITS tablet   Oral   Take 1,000 Units by mouth daily.          Marland Kitchen ibuprofen (ADVIL,MOTRIN) 200 MG tablet   Oral   Take 600 mg by mouth every 6 (six) hours as needed. For pain         . lisinopril (PRINIVIL,ZESTRIL) 20 MG tablet   Oral   Take 20 mg by mouth daily.            Triage Vitals: BP 124/49  Pulse 116  Temp(Src) 97.5 F (36.4 C)  Resp 18  Ht 5' (1.524 m)  Wt 130 lb (58.968 kg)  BMI 25.39 kg/m2  SpO2 100%  Physical Exam  Nursing note and vitals reviewed.  CONSTITUTIONAL: Well developed/well nourished HEAD: Normocephalic/atraumatic EYES: EOMI ENMT: Mucous membranes moist NECK: supple no meningeal signs SPINE:entire spine nontender CV: S1/S2 noted, no murmurs/rubs/gallops noted LUNGS: Lungs are clear to auscultation bilaterally, no apparent distress ABDOMEN: soft, nontender, no rebound or guarding GU:no cva tenderness RECTAL: black stool noted, chaperone present, hemoccult positive NEURO: Pt is awake/alert, moves all extremitiesx4 EXTREMITIES: pulses normal, full ROM SKIN: warm, pallor noted PSYCH: no abnormalities of mood noted  ED Course  Procedures (including critical care time) CRITICAL CARE Performed by: Joya Gaskins Total critical care time: 31 Critical care time was exclusive of separately billable procedures and treating other patients. Critical care was necessary to treat or prevent imminent or life-threatening deterioration. Critical care was time spent personally by me on the following activities: development of treatment plan with patient and/or surrogate as well as nursing, discussions with consultants, evaluation of patient's response to  treatment, examination of patient, obtaining history from patient or surrogate, ordering and performing treatments and interventions, ordering and review of laboratory studies, ordering and review of radiographic studies, pulse oximetry and re-evaluation of patient's condition.   Medications  ondansetron (ZOFRAN) injection 4 mg (4 mg Intravenous Given 10/02/13 1149)    DIAGNOSTIC STUDIES: Oxygen Saturation is 100% on room air, normal by my interpretation.    COORDINATION OF CARE: 12:22 PM-Discussed treatment plan which includes Zofran, CBC and CMP with pt at bedside and pt agreed to plan. Discussed need for blood transfusion and possible allergic reaction risks. Pt is agreeable to getting a blood transfusion. Informed pt that she will be admitted and pt agreed.  1:20 PM D/w dr Jena Gauss - recommends admission and will perform EGD D/w dr Sherrie Mustache with [facility], will admit to stepdown Pt has been ordered/consented for blood transfusion for acute anemia (HGB 5) She has been given protonix She is NPO  Labs Review Labs Reviewed  CBC WITH  DIFFERENTIAL - Abnormal; Notable for the following:    RBC 2.06 (*)    Hemoglobin 5.3 (*)    HCT 18.0 (*)    MCH 25.7 (*)    MCHC 29.4 (*)    RDW 16.3 (*)    All other components within normal limits  COMPREHENSIVE METABOLIC PANEL - Abnormal; Notable for the following:    Glucose, Bld 136 (*)    BUN 41 (*)    Albumin 3.2 (*)    GFR calc non Af Amer 85 (*)    All other components within normal limits  TYPE AND SCREEN   Imaging Review No results found.  EKG Interpretation   None       MDM  No diagnosis found. Nursing notes including past medical history and social history reviewed and considered in documentation Labs/vital reviewed and considered   I personally performed the services described in this documentation, which was scribed in my presence. The recorded information has been reviewed and is accurate.      Joya Gaskins,  MD 10/02/13 1321

## 2013-10-02 NOTE — ED Notes (Signed)
Pt also states she takes iron pills.

## 2013-10-02 NOTE — H&P (Signed)
[facility] History and Physical  Wanda [name]:[id number] DOB: May 24, 1933 DOA: 10/02/2013  Referring physician:  PCP: Ninfa Linden, FNP  Specialists:   Chief Complaint: Melena and coffee-ground emesis  HPI: Wanda [name] is a very pleasant 79 y.o. female past medical history that includes iron deficiency anemia, diverticulosis, hypertension, GERD, presents to the emergency department with the chief complaint of melena and coffee-ground emesis. Information is obtained from the patient and the daughter-in-law who is at the bedside. Patient reports being in her usual state of health until this morning when she was awakened feeling nauseated in the middle the night. She states she had a very dark Jashad Depaula stool. She currently takes iron supplements and therefore she was not concerned at the time. She then went back to sleep when she awakened this morning she felt dizzy with some shortness of breath and weakness. She states I felt like I might pass out. She then had one episode of moderate amount coffee-ground emesis. She denies history of GI bleed. She denies taking aspirin or any other NSAID except for 2 days ago she had a headache and took 2 Advil. She denies any chest pain palpitations. She denies any abdominal pain or diarrhea. She denies dysuria hematuria frequency or urgency. She reports no unintentional weight loss. Of note she has had a fairly extensive GI workup for anemia that she said she has had "for a very long time". Initial evaluation in the emergency room significant for a hemoglobin of 5.3, hematocrit 18.0, MCV 87.4, BUN 41 and creatinine 0.61, glucose 136, was heme positive . Vital signs were stable but she was a little tachycardic with heart rate range of 99 to 115.She was given 1 L of normal saline and 40 mg of protonic center venously. She was also given one dose of Zofran. There have been no further episodes of hard stool or emesis. In the emergency room  gastroenterology was consulted. 2 units of packed red blood cells have been ordered. GI is planning for EGD after the first unit of packed red blood cells. [facility] are asked to admit   Review of Systems: 10 point review of systems completed and all systems are negative except as indicated by history of present illness  Past Medical History  Diagnosis Date  . IDA (iron deficiency anemia)   . GERD (gastroesophageal reflux disease)   . Gastritis 12/08/10    EGD by Dr. Nancy Fetter hiatal hernia, duodenal diverticulum, chronic gastritis  . Diverticulosis 10/20/10    Left-sided on colonoscopy by Dr. Jena Gauss 10/20/10  . HTN (hypertension)   . Vitamin D deficiency   . Osteoarthritis   . Heart murmur   . Sleep apnea     STOP BANG SCORE 4  . Cystocele   . Urge incontinence   . Radial fracture     (right) Undergoing treatment by Dr. Romeo Apple currently   Past Surgical History  Procedure Laterality Date  . Colonoscopy  02/29/2012    Dr. Jena Gauss: colonic diverticulosis, minimal internal hemorrhoids, benign polyp  . Esophagogastroduodenoscopy  02/29/2012    Dr. Jena Gauss: atonic esophagus, moderate-sized hiatal hernia, fundal gland polyps  . Givens capsule study  02/29/2012    No explanation for IDA. Possible extrinsic compression but negative CT.   . Vaginal hysterectomy  05/17/2012    Procedure: HYSTERECTOMY VAGINAL;  Surgeon: Tilda Burrow, MD;  Location: [facility];  Service: Gynecology;  Laterality: N/A;  . Anterior and posterior repair  05/17/2012    Procedure: ANTERIOR (CYSTOCELE)  AND POSTERIOR REPAIR (RECTOCELE);  Surgeon: Tilda Burrow, MD;  Location: [facility];  Service: Gynecology;  Laterality: N/A;  . Salpingoophorectomy  05/17/2012    Procedure: SALPINGO OOPHERECTOMY;  Surgeon: Tilda Burrow, MD;  Location: [facility];  Service: Gynecology;  Laterality: Bilateral;   Social History:  reports that she has never smoked. She has never used smokeless tobacco. She reports that she does not drink  alcohol or use illicit drugs. Patient is a retired Psychologist, forensic. She lives alone. She is independent with ADLs. She denies any smoking, EtOH use, illicit drug use Allergies  Allergen Reactions  . Naproxen Sodium Rash and Other (See Comments)    Passed out    Family History  Problem Relation Age of Onset  . Hypertension Mother   . Lymphoma Brother   . Anesthesia problems Neg Hx   . Hypotension Neg Hx   . Malignant hyperthermia Neg Hx   . Pseudochol deficiency Neg Hx   . Colon cancer Neg Hx      Prior to Admission medications   Medication Sig Start Date End Date Taking? Authorizing Provider  Cholecalciferol (VITAMIN D) 2000 UNITS CAPS Take 1 capsule by mouth 3 (three) times a week.   Yes Historical Provider, MD  Ferrous Sulfate 27 MG TABS Take 1 tablet by mouth 3 (three) times a week.   Yes Historical Provider, MD  ibuprofen (ADVIL,MOTRIN) 200 MG tablet Take 400 mg by mouth every 6 (six) hours as needed for headache. For pain   Yes Historical Provider, MD  lisinopril (PRINIVIL,ZESTRIL) 20 MG tablet Take 20 mg by mouth daily.     Yes Historical Provider, MD  RANITIDINE HCL PO Take 1 tablet by mouth daily as needed.   Yes Historical Provider, MD   Physical Exam: Filed Vitals:   10/02/13 1355  BP: 100/60  Pulse: 99  Temp: 98.3 F (36.8 C)  Resp: 20     General:  Well-nourished slightly pasty appearing no acute distress  Eyes: Pupils are equal round reactive to light, EOMI, no scleral icterus  ENT: Ears are clear nose without drainage. Oropharynx without erythema or exudate. Mucous membranes of her mouth are slightly pale and dry.  Neck: Supple no JVD full range of motion no lymphadenopathy  Cardiovascular: S1 and S2. I appreciate no murmur no gallop no rub. There is no lower extremity edema. Pedal pulses are present and palpable  Respiratory: Normal effort. Breath sounds are clear bilaterally to auscultation. I hear no wheezes no rhonchi  Abdomen: Round and soft.  Nondistended. Positive bowel sounds in all 4 quadrants. Abdomen is nontender to palpation. Appreciate no mass organomegaly  Skin: Warm and dry. There are no rashes or lesions  Musculoskeletal: Joints without erythema or swelling. Full range of motion. Nontender to palpation  Psychiatric:  calm  Neurologic: Patient is alert and oriented x3. Cranial nerves II through XII grossly intact. Speech is clear. Facial symmetry noted.  Labs on Admission:  Basic Metabolic Panel:  Recent Labs Lab 10/02/13 1141  NA 141  K 4.2  CL 109  CO2 22  GLUCOSE 136*  BUN 41*  CREATININE 0.61  CALCIUM 8.8   Liver Function Tests:  Recent Labs Lab 10/02/13 1141  AST 20  ALT 16  ALKPHOS 76  BILITOT 0.6  PROT 6.3  ALBUMIN 3.2*   No results found for this basename: LIPASE, AMYLASE,  in the last 168 hours No results found for this basename: AMMONIA,  in the last 168 hours CBC:  Recent Labs Lab 10/02/13 1141  WBC 9.6  NEUTROABS 8.2*  HGB 5.3*  HCT 18.0*  MCV 87.4  PLT 380   Cardiac Enzymes: No results found for this basename: CKTOTAL, CKMB, CKMBINDEX, TROPONINI,  in the last 168 hours  BNP (last 3 results) No results found for this basename: PROBNP,  in the last 8760 hours CBG: No results found for this basename: GLUCAP,  in the last 168 hours  Radiological Exams on Admission: No results found.  EKG:   Assessment/Plan Principal Problem:   Acute blood loss anemia: Presumably from GI bleed given large dark stool and episode of hematemesis. Hemoglobin 5.3 on admission. Will admit to step down. Will transfuse 2 units of packed RBCs. Emergency room physician contacted gastroenterology his initial evaluation included scheduling of the EGD after first unit of packed blood cells. Of note patient with a history of iron anemia for which she has undergone a GI workup specifically EGD/TCS in 2013 showing atonic esophagus, moderate size hiatal hernia, fundal gland polyps, colonic diverticulosis,  minimal internal hemorrhoids, single benign descending colon polyp. deficiency anemia. In addition she's undergone capsule study without obvious explanation for IDA. CT was also performed due to the question of possible extrinsic compression and it was negative for small bowel mass or extrinsic mass. Will recheck her hemoglobin 2 hours after second  transfusion and/or if there for any further episodes of melena or hematemesis.   Active Problems: GI bleed: See above. Patient denies any aspirin use or consistent NSAID use. See above. Has had extensive GI workup due to anemia. Appreciate assistance of gastroenterology. EGD tentatively scheduled for this afternoon. Will monitor on step down get serial CBCs. Continue Protonix. Continue n.p.o. status. Currently patient is hemodynamically stable  Tachycardia: Likely related to #1. Patient given 1 L of normal saline in the emergency department. Will continue normal saline intravenously that 100 mils per hour. Will obtain an EKG. Monitor on stepdown    HTN (hypertension): Blood pressure somewhat soft at the time of my exam. Will hold her home lisinopril. Will monitor on step down. Will continue IV fluids at 100 mils per hour. Monitor closely    Diverticulosis: Nicole Kindred does results of colonoscopy done 2013. No history of diverticulitis. See above.    IDA (iron deficiency anemia): See #1. Chart review indicates her baseline hemoglobin is around 11.o  Melena: See #2. Patient is also on iron supplement. Stool heme positive.  Hematemesis: No further episodes since presentation to the emergency department. Will provide Zofran for nausea    GERD: See above. Isn't on Zantac at home. Will will hold for now.      Dr. Jena Gauss  Code Status: full Family Communication: daughter in law at bedside Disposition Plan: home when ready  Time spent: 73 minutes  Gwenyth Bender [facility] Pager [phone]  If 7PM-7AM, please contact  night-coverage [email or web address] Password [email or web address] 10/02/2013, 2:14 PM

## 2013-10-02 NOTE — Op Note (Addendum)
[facility] [address] [place], [address]   ENDOSCOPY PROCEDURE REPORT  PATIENT: Wanda, [name]  MR#: [id number] BIRTHDATE: 07/20/1933 , 80  yrs. old GENDER: Female ENDOSCOPIST: R.  Roetta Sessions, MD FACP [facility] REFERRED BY:  Ninfa Linden, NP PROCEDURE DATE:  10/02/2013 PROCEDURE:     Diagnostic EGD  INDICATIONS:     coffee-ground emesis; melena;  significant decline hemoglobin  INFORMED CONSENT:   The risks, benefits, limitations, alternatives and imponderables have been discussed.  The potential for biopsy, esophogeal dilation, etc. have also been reviewed.  Questions have been answered.  All parties agreeable.  Please see the history and physical in the medical record for more information.  MEDICATIONS:   Versed 5 mg IV and Demerol 50 mg IV in divided doses. Separate 4 mg IV. Cetacaine spray.  DESCRIPTION OF PROCEDURE:   The EG-[id number] ([id number]) and [id number] ([id number])  endoscope was introduced through the mouth and advanced to the second portion of the duodenum without difficulty or limitations.  The mucosal surfaces were surveyed very carefully during advancement of the scope and upon withdrawal.  Retroflexion view of the proximal stomach and esophagogastric junction was performed.      FINDINGS: Esophagus okay. Much old blood and some food debris in the stomach. This finding initially precluded evaluation of the stomach. However, I removed the diagnostic gastroscope and obtained a double channel scope. I attached the BioVac to it and lavaged about 600 cc of old blood/food debris out of the stomach to gain adequate visualization. Subsequently, I went back to the diagnostic scope and performed a thorough EGD. Patient had a rather large hiatal hernia and 4 areas of linear, longitudinal gastric excoriations /erosions straddling the diaphragmatic hiatus consistent with Sheria Lang lesions. I saw no infiltrating process, peptic ulcer or other  abnormality to explain bleeding. Patient had multiple small fundal gland type polyps-not manipulated/previously biopsied.Pylorus patent. Examination of the first second and third portion of the duodenum revealed no abnormalities.  THERAPEUTIC / DIAGNOSTIC MANEUVERS PERFORMED:  None   COMPLICATIONS:  None  IMPRESSION:   Multiple, fresh appearing Sheria Lang lesions most likely responsible for upper GI bleeding. However, This may be a secondary phenomenon from the nonspecific symptoms of nausea and vomiting. Small gastric polyps-not manipulated.  I am uncertain as to whether the lesions found in the stomach today totally explain a 4 g drop in hemoglobin.   An occult coexisting Dieulafoy lesion would also remain a possibility.  RECOMMENDATIONS:   No further GI evaluation warranted at this time. Follow H&H. Add Carafate to the regimen. Avoid all nonsteroidals. I've paged Toya Smothers, FNP to discuss.  I have also discussed my findings and recommendations with the patient's son, Mr. Altland, via telephone this evening.    _______________________________ R. Roetta Sessions, MD FACP [facility] eSigned:  R. Roetta Sessions, MD FACP [facility] 10/02/2013 5:20 PM Revised: 10/02/2013 5:20 PM    CC:  PATIENT NAME:  Wanda, [name] MR#: [id number]

## 2013-10-02 NOTE — Consult Note (Addendum)
Referring Provider: Dr. Bebe Shaggy, ED Primary Care Physician:  Ninfa Linden, NP Primary Gastroenterologist:  Dr. Jena Gauss   Date of Admission: 10/02/13 Date of Consultation: 10/02/13  Reason for Consultation:  Hematemesis, melena, acute blood loss anemia  HPI:  Wanda [name] is well-known to our practice from prior work-up for IDA in 2013; EGD/TCS on 02/29/12 showed atonic esophagus, moderate sized hh, fundal gland polyps, colonic diverticulosis, minimal internal hemorrhoids, single benign ascending colon polyp. Capsule study then performed without obvious explanation for IDA. CT performed due to question of possible extrinsic compression; this was negative for small bowel mass or extrinsic mass.  Baseline Hgb appears to be in the 11 range.   Presented to ED with Hgb 5.3, reports of black emesis, and melena. Outpatient medication list includes Ibuprofen. No aspirin. A few Ibuprofen this week but prior to this avoided NSAIDs. No aspirin powders.   Woke up at 2am this morning very nauseated but no emesis. Had bowel movement that looked black and tarry. Takes iron but states this was darker than normal. One episode of black emesis. No abdominal pain. Felt very dizzy, fearful to walk around in house. Notes feeling tired lately. No PPI for awhile. Stomach feels "funny" after eating, has to go lay down. Hard to explain. Feels weak.   Past Medical History  Diagnosis Date  . IDA (iron deficiency anemia)   . GERD (gastroesophageal reflux disease)   . Gastritis 12/08/10    EGD by Dr. Nancy Fetter hiatal hernia, duodenal diverticulum, chronic gastritis  . Diverticulosis 10/20/10    Left-sided on colonoscopy by Dr. Jena Gauss 10/20/10  . HTN (hypertension)   . Vitamin D deficiency   . Osteoarthritis   . Heart murmur   . Sleep apnea     STOP BANG SCORE 4  . Cystocele   . Urge incontinence   . Radial fracture     (right) Undergoing treatment by Dr. Romeo Apple currently    Past Surgical History   Procedure Laterality Date  . Colonoscopy  02/29/2012    Dr. Jena Gauss: colonic diverticulosis, minimal internal hemorrhoids, benign polyp  . Esophagogastroduodenoscopy  02/29/2012    Dr. Jena Gauss: atonic esophagus, moderate-sized hiatal hernia, fundal gland polyps  . Givens capsule study  02/29/2012    No explanation for IDA. Possible extrinsic compression but negative CT.   . Vaginal hysterectomy  05/17/2012    Procedure: HYSTERECTOMY VAGINAL;  Surgeon: Tilda Burrow, MD;  Location: [facility];  Service: Gynecology;  Laterality: N/A;  . Anterior and posterior repair  05/17/2012    Procedure: ANTERIOR (CYSTOCELE) AND POSTERIOR REPAIR (RECTOCELE);  Surgeon: Tilda Burrow, MD;  Location: [facility];  Service: Gynecology;  Laterality: N/A;  . Salpingoophorectomy  05/17/2012    Procedure: SALPINGO OOPHERECTOMY;  Surgeon: Tilda Burrow, MD;  Location: [facility];  Service: Gynecology;  Laterality: Bilateral;    Prior to Admission medications   Medication Sig Start Date End Date Taking? Authorizing Provider  ibuprofen (ADVIL,MOTRIN) 200 MG tablet Take 600 mg by mouth every 6 (six) hours as needed. For pain    Historical Provider, MD  lisinopril (PRINIVIL,ZESTRIL) 20 MG tablet Take 20 mg by mouth daily.      Historical Provider, MD    Current Facility-Administered Medications  Medication Dose Route Frequency Provider Last Rate Last Dose  . sodium chloride 0.9 % bolus 1,000 mL  1,000 mL Intravenous Once Joya Gaskins, MD       Current Outpatient Prescriptions  Medication Sig Dispense Refill  . Cholecalciferol (  VITAMIN D) 2000 UNITS CAPS Take 1 capsule by mouth 3 (three) times a week.      . Ferrous Sulfate 27 MG TABS Take 1 tablet by mouth 3 (three) times a week.      Marland Kitchen ibuprofen (ADVIL,MOTRIN) 200 MG tablet Take 400 mg by mouth every 6 (six) hours as needed for headache. For pain      . lisinopril (PRINIVIL,ZESTRIL) 20 MG tablet Take 20 mg by mouth daily.        Marland Kitchen RANITIDINE HCL PO Take 1 tablet by mouth  daily as needed.        Allergies as of 10/02/2013 - Review Complete 10/02/2013  Allergen Reaction Noted  . Naproxen sodium Rash and Other (See Comments)     Family History  Problem Relation Age of Onset  . Hypertension Mother   . Lymphoma Brother   . Anesthesia problems Neg Hx   . Hypotension Neg Hx   . Malignant hyperthermia Neg Hx   . Pseudochol deficiency Neg Hx   . Colon cancer Neg Hx     History   Social History  . Marital Status: Widowed    Spouse Name: N/A    Number of Children: N/A  . Years of Education: N/A   Occupational History  . Retired    Social History Main Topics  . Smoking status: Never Smoker   . Smokeless tobacco: Never Used  . Alcohol Use: No  . Drug Use: No  . Sexual Activity: Not Currently    Birth Control/ Protection: Surgical   Other Topics Concern  . Not on file   Social History Narrative  . No narrative on file    Review of Systems: Gen: chills this morning, fatigued moreso lately CV: Denies chest pain, heart palpitations, syncope, edema  Resp: Denies shortness of breath with rest, cough, wheezing GI: see HPI GU : Denies urinary burning, urinary frequency, urinary incontinence.  MS: +joint pain Derm: Denies rash, itching, dry skin Psych: Denies depression, anxiety,confusion, or memory loss Heme: see HPI  Physical Exam: Vital signs in last 24 hours: Temp:  [97.5 F (36.4 C)] 97.5 F (36.4 C) (11/17 1121) Pulse Rate:  [99-116] 99 (11/17 1321) Resp:  [18] 18 (11/17 1321) BP: (123-124)/(45-49) 123/45 mmHg (11/17 1321) SpO2:  [98 %-100 %] 98 % (11/17 1321) Weight:  [130 lb (58.968 kg)] 130 lb (58.968 kg) (11/17 1121)   General:   Alert,  Well-developed, slightly pale but in good spirits. Appears younger than stated age. Head:  Normocephalic and atraumatic. Eyes:  Sclera clear, no icterus.   Conjunctiva pink. Ears:  Normal auditory acuity. Nose:  No deformity, discharge,  or lesions. Mouth:  No deformity or lesions,  dentition normal. Neck:  Supple; no masses or thyromegaly. Lungs:  Clear throughout to auscultation.   No wheezes, crackles, or rhonchi. No acute distress. Heart:  S1 S2 present; no murmurs, clicks, rubs,  or gallops. Abdomen:  Soft, nontender and nondistended. No masses, hepatosplenomegaly or hernias noted. Normal bowel sounds, without guarding, and without rebound.   Rectal:  Deferred  Msk:  Symmetrical without gross deformities. Normal posture. Extremities:  Without clubbing or edema. Neurologic:  Alert and  oriented x4;  grossly normal neurologically. Skin:  Intact without significant lesions or rashes. Cervical Nodes:  No significant cervical adenopathy. Psych:  Alert and cooperative. Normal mood and affect.  Intake/Output from previous day:   Intake/Output this shift:    Lab Results:  Recent Labs  10/02/13 1141  WBC 9.6  HGB 5.3*  HCT 18.0*  PLT 380   BMET  Recent Labs  10/02/13 1141  NA 141  K 4.2  CL 109  CO2 22  GLUCOSE 136*  BUN 41*  CREATININE 0.61  CALCIUM 8.8   LFT  Recent Labs  10/02/13 1141  PROT 6.3  ALBUMIN 3.2*  AST 20  ALT 16  ALKPHOS 76  BILITOT 0.6   Impression: 79 year old female presenting with one episode of black emesis, likely melena, and profoundly acute on chronic anemia, Hgb 5.3. Colonoscopy,EGD, and capsule study performed April 2013 due to IDA; all without significant findings or anything to explain IDA. Appears baseline Hgb is around 10. Denies chronic/routine use of Ibuprofen, although she does admit to taking this a few times this week. There is no reason to believe she has any underlying liver disease, as last EGD was without stigmata of liver disease and CT with contrast in May 2013 showed a normal liver.   Discussed with patient and daughter-in-law at the bedside the need for upper endoscopy this afternoon. They are both agreeable to this plan and state understanding. Our goal would be to have 1 unit of blood completed  prior to EGD this afternoon with Dr. Jena Gauss. I have expressed this to the nursing staff, who is in agreement. At time of this note, blood bank reported that blood was ready, and I relayed this to the front desk at the ED.    Plan: Remain NPO EGD with Dr. Jena Gauss this afternoon 2 units PRBCs now PPI BID Monitor Hgb/Hct Avoidance of all NSAIDs in the future Further recommendations once procedure completed.  Nira Retort, ANP-BC [facility]     LOS: 0 days    10/02/2013, 1:39 PM  Attending note:  Patient seen and examined in short stay. Agree with need for EGD. Acute on chronic anemia. Thorough workup for  GI bleed/IDA last year as chronicled above. Small bowel biopsies negative for celiac disease. Both Gastric biopsies and serologies negative for H. pylori.  EGD today as planned.The risks, benefits, limitations, alternatives and imponderables have been reviewed with the patient. Potential for esophageal dilation, biopsy, etc. have also been reviewed.  Questions have been answered. All parties agreeable.

## 2013-10-02 NOTE — ED Notes (Signed)
Patient had episodes of vomiting black, coffee ground material at home.  Also had black, tarry stool today.  Stools are ususally black d/t taking iron supplement; however, she has not taken it over the past 2 days.  Denies any regular use of NSAIDS, epigastric pain or burning sensation in abdomen.  Has been treated for GERD in past, but has not been on meds for several months.

## 2013-10-02 NOTE — ED Notes (Addendum)
Report called to [place], RN on [place] unit

## 2013-10-02 NOTE — ED Notes (Signed)
Woke up nauseated, unable to vomit until later this morning and states emesis was black. Pt states black stool this morning also which was black. Also states dizziness and chills.

## 2013-10-02 NOTE — H&P (Signed)
The patient was seen and examined. Her vital signs and laboratory studies were reviewed. She was discussed with nurse practitioner, Ms. Vedia Coffer. Agree with her findings. The patient is now status post EGD by Dr. Jena Gauss. The results were significant for old blood and food debris in the stomach; large hiatal hernia; 4 areas of linear, longitudinal gastric excoriations/erosions straddling the diaphragmatic hiatus consistent with Sheria Lang lesions; multiple small fundal gland type polyps; and unremarkable duodenum. She is currently eating/drinking clear liquids. She has no complaints of nausea, vomiting, or abdominal pain. She is status post 1 unit of packed red blood cells and is due for another unit momentarily. We'll continue IV Protonix, gentle hydration, and Carafate as ordered by Dr. Jena Gauss. Will followup with another CBC in the morning. Will reiterate avoidance of all nonsteroidals.

## 2013-10-03 LAB — CBC
HCT: 22.3 % — ABNORMAL LOW (ref 36.0–46.0)
HCT: 25.2 % — ABNORMAL LOW (ref 36.0–46.0)
HCT: 25.9 % — ABNORMAL LOW (ref 36.0–46.0)
Hemoglobin: 7.2 g/dL — ABNORMAL LOW (ref 12.0–15.0)
Hemoglobin: 8.1 g/dL — ABNORMAL LOW (ref 12.0–15.0)
Hemoglobin: 8.3 g/dL — ABNORMAL LOW (ref 12.0–15.0)
MCH: 27.9 pg (ref 26.0–34.0)
MCH: 28 pg (ref 26.0–34.0)
MCH: 28 pg (ref 26.0–34.0)
MCHC: 32.3 g/dL (ref 30.0–36.0)
MCHC: 32.1 g/dL (ref 30.0–36.0)
MCHC: 32 g/dL (ref 30.0–36.0)
MCV: 86.4 fL (ref 78.0–100.0)
MCV: 87.2 fL (ref 78.0–100.0)
MCV: 87.5 fL (ref 78.0–100.0)
Platelets: 234 10*3/uL (ref 150–400)
Platelets: 257 10*3/uL (ref 150–400)
Platelets: 287 10*3/uL (ref 150–400)
RBC: 2.58 MIL/uL — ABNORMAL LOW (ref 3.87–5.11)
RBC: 2.89 MIL/uL — ABNORMAL LOW (ref 3.87–5.11)
RBC: 2.96 MIL/uL — ABNORMAL LOW (ref 3.87–5.11)
RDW: 15.3 % (ref 11.5–15.5)
RDW: 15.5 % (ref 11.5–15.5)
RDW: 15.7 % — ABNORMAL HIGH (ref 11.5–15.5)
WBC: 10.4 10*3/uL (ref 4.0–10.5)
WBC: 10.6 10*3/uL — ABNORMAL HIGH (ref 4.0–10.5)
WBC: 10.1 10*3/uL (ref 4.0–10.5)

## 2013-10-03 LAB — BASIC METABOLIC PANEL
BUN: 25 mg/dL — ABNORMAL HIGH (ref 6–23)
CO2: 19 mEq/L (ref 19–32)
Calcium: 7.9 mg/dL — ABNORMAL LOW (ref 8.4–10.5)
Chloride: 113 mEq/L — ABNORMAL HIGH (ref 96–112)
Creatinine, Ser: 0.68 mg/dL (ref 0.50–1.10)
GFR calc Af Amer: >90 mL/min (ref >90)
GFR calc non Af Amer: 82 mL/min — ABNORMAL LOW (ref >90)
Glucose, Bld: 95 mg/dL (ref 70–99)
Potassium: 3.4 mEq/L — ABNORMAL LOW (ref 3.5–5.1)
Sodium: 143 mEq/L (ref 135–145)

## 2013-10-03 MED ORDER — POTASSIUM CHLORIDE CRYS ER 20 MEQ PO TBCR
20.0000 meq | EXTENDED_RELEASE_TABLET | Freq: Two times a day (BID) | ORAL | Status: AC
  Administered 2013-10-03 (×2): 20 meq via ORAL
  Filled 2013-10-03 (×2): qty 1

## 2013-10-03 MED ORDER — PANTOPRAZOLE SODIUM 40 MG PO TBEC
40.0000 mg | DELAYED_RELEASE_TABLET | Freq: Every day | ORAL | Status: DC
  Administered 2013-10-03 – 2013-10-04 (×2): 40 mg via ORAL
  Filled 2013-10-03 (×2): qty 1

## 2013-10-03 MED ORDER — POTASSIUM CHLORIDE IN NACL 20-0.9 MEQ/L-% IV SOLN
INTRAVENOUS | Status: DC
  Administered 2013-10-03 – 2013-10-05 (×3): via INTRAVENOUS

## 2013-10-03 NOTE — Progress Notes (Signed)
[facility] PROGRESS NOTE  TRACE CEDERBERG ZOX:[id number] DOB: [birth year] DOA: 10/02/2013 PCP: Ninfa Linden, FNP    Code Status: Full code Family Communication: Discuss with family on 10/02/2013 Disposition Plan: Discharge to home when clinically appropriate.   Consultants:  Gastroenterology  Procedures:  EGD 10/02/2013 by Dr. Jena Gauss: IMPRESSION: Multiple, fresh appearing Sheria Lang lesions most likely  responsible for upper GI bleeding. However, This may be a secondary  phenomenon from the nonspecific symptoms of nausea and vomiting.  Small gastric polyps-not manipulated.   Antibiotics:  None  HPI/Subjective: The patient has no complaints of nausea, vomiting, hematemesis, or black tarry stools. She has not had a bowel movement overnight. All in all, she says that she had a good night.  Objective: Filed Vitals:   10/03/13 0500  BP: 114/56  Pulse:   Temp:   Resp: 17   Temperature 98.5. Pulse 77-116 blood pressure 114/56 oxygen saturation 96%.   Intake/Output Summary (Last 24 hours) at 10/03/13 0716 Last data filed at 10/03/13 0500  Gross per 24 hour  Intake 1591.67 ml  Output    200 ml  Net 1391.67 ml   Filed Weights   10/02/13 1121  Weight: 58.968 kg (130 lb)    Exam:   General:  Pleasant 79 year old woman lying in bed, in no acute distress.  Cardiovascular: S1, S2, with borderline tachycardia.  Respiratory: Clear to auscultation bilaterally.  Abdomen: Positive bowel sounds, soft, nontender, nondistended.  Musculoskeletal:  No acute hot red joints. Pedal pulses palpable. No pedal edema.  Neurologic: She is alert and oriented x3. Cranial nerves II through XII are grossly intact.   Data Reviewed: Basic Metabolic Panel:  Recent Labs Lab 10/02/13 1141 10/03/13 0441  NA 141 143  K 4.2 3.4*  CL 109 113*  CO2 22 19  GLUCOSE 136* 95  BUN 41* 25*  CREATININE 0.61 0.68  CALCIUM 8.8 7.9*   Liver Function Tests:  Recent Labs Lab  10/02/13 1141  AST 20  ALT 16  ALKPHOS 76  BILITOT 0.6  PROT 6.3  ALBUMIN 3.2*   No results found for this basename: LIPASE, AMYLASE,  in the last 168 hours No results found for this basename: AMMONIA,  in the last 168 hours CBC:  Recent Labs Lab 10/02/13 1141 10/02/13 2151 10/03/13 0441  WBC 9.6 12.8* 10.4  NEUTROABS 8.2*  --   --   HGB 5.3* 7.8* 7.2*  HCT 18.0* 24.2* 22.3*  MCV 87.4 86.4 86.4  PLT 380 272 234   Cardiac Enzymes: No results found for this basename: CKTOTAL, CKMB, CKMBINDEX, TROPONINI,  in the last 168 hours BNP (last 3 results) No results found for this basename: PROBNP,  in the last 8760 hours CBG: No results found for this basename: GLUCAP,  in the last 168 hours  Recent Results (from the past 240 hour(s))  MRSA PCR SCREENING     Status: None   Collection Time    10/02/13  2:13 PM      Result Value Range Status   MRSA by PCR NEGATIVE  NEGATIVE Final   Comment:            The GeneXpert MRSA Assay (FDA     approved for NASAL specimens     only), is one component of a     comprehensive MRSA colonization     surveillance program. It is not     intended to diagnose MRSA     infection nor to guide or  monitor treatment for     MRSA infections.     Studies: No results found.  Scheduled Meds: . ferrous sulfate  325 mg Oral Q M,W,F  . influenza vac split quadrivalent PF  0.5 mL Intramuscular Tomorrow-1000  . pantoprazole (PROTONIX) IV  40 mg Intravenous Q12H  . pneumococcal 23 valent vaccine  0.5 mL Intramuscular Tomorrow-1000  . potassium chloride  20 mEq Oral BID  . sucralfate  1 g Oral TID WC & HS   Continuous Infusions: . 0.9 % NaCl with KCl 20 mEq / L     Assessment:  Principal Problem:   Acute blood loss anemia Active Problems:   GI bleed   Multiple gastric erosions   Hiatal hernia   GERD   HTN (hypertension)   Diverticulosis   IDA (iron deficiency anemia)   Melena   Hematemesis   Sinus tachycardia   Gastric  polyps   1. Upper GI bleeding, thought to be secondary to multiple gastric erosions, consistent with Sheria Lang lesions, per Dr. Jena Gauss. Per his assessment, it is likely that these lesions were responsible for the upper GI bleeding, however, he was not certain if the lesions found in the stoma would totally explain a 4 g drop in her hemoglobin. An occult coexisting Dieulafoy lesion would also remain a possibility. We'll continue IV Protonix and Carafate. We'll change Protonix to by mouth per the discretion of gastroenterology. Currently, the patient is asymptomatic.  Previously known gastric polyps and large hiatal hernia per EGD.  Acute blood loss anemia secondary to upper GI bleed superimposed on known iron deficiency anemia. Status post 2 units of packed red blood cells. Her hemoglobin has improved appropriately. We'll continue to monitor her CBC for need of ongoing transfusions. We'll try to keep her hemoglobin above 7 g.  Sinus tachycardia and low-normal blood pressure secondary to volume depletion/dehydration. These are resolving with IV fluid hydration.  History of hypertension. Lisinopril is on hold for low-normal blood pressures. We'll continue to monitor and hydrate her.  Hypokalemia. Likely secondary to IV fluids and the dilutional effects. Will supplement orally with potassium chloride and gently and IV fluids.      Plan: 1. Start potassium chloride supplementation. Change IV fluids to add potassium chloride. 2. Will check her CBC every 6 hours. If her hemoglobin drifts down below 7, we'll transfuse her another unit of packed red blood cells. 3. We'll await gastroenterology followup assessment. We'll defer changing Protonix to by mouth and advancing her diet to them. 4. Query disposition. Would defer to gastroenterology's recommendation, but would favor keeping the patient in the hospital until her hemoglobin stabilizes today or tomorrow.   Time spent: 30  minutes.    [facility]  [facility] Pager [phone]. If 7PM-7AM, please contact night-coverage at [email or web address], password [facility] 10/03/2013, 7:16 AM  LOS: 1 day

## 2013-10-03 NOTE — Progress Notes (Addendum)
Attending note: Patient seen and examined.   Agree with above assessment and plan. Would avoid nonsteroidal agents in the future as much as possible

## 2013-10-03 NOTE — Progress Notes (Signed)
Report called to [facility]. Patient transferred to 322 in stable condition via wheelchair.

## 2013-10-03 NOTE — Care Management Note (Signed)
    Page 1 of 1   10/03/2013     3:10:52 PM   CARE MANAGEMENT NOTE 10/03/2013  Patient:  Wanda [name], Wanda [name]   Account Number:  [id number]  Date Initiated:  10/03/2013  Documentation initiated by:  Sharrie Rothman  Subjective/Objective Assessment:   Pt admitted from home with gi bleed. Pt lives alone and will return home at discharge. Pt is independent with ADL's.     Action/Plan:   No CM needs noted.   Anticipated DC Date:  10/05/2013   Anticipated DC Plan:  HOME/SELF CARE      DC Planning Services  CM consult      Choice offered to / List presented to:             Status of service:  Completed, signed off Medicare Important Message given?   (If response is "NO", the following Medicare IM given date fields will be blank) Date Medicare IM given:   Date Additional Medicare IM given:    Discharge Disposition:  HOME/SELF CARE  Per UR Regulation:    If discussed at Long Length of Stay Meetings, dates discussed:    Comments:  10/03/13 1510 Arlyss Queen, RN BSN CM

## 2013-10-03 NOTE — Progress Notes (Signed)
UR chart review completed.  

## 2013-10-03 NOTE — Progress Notes (Signed)
Subjective: Denies abdominal pain, N/V, melena, hematemesis. Tolerated clear liquids. Interested in going home today.   Objective: Vital signs in last 24 hours: Temp:  [97.4 F (36.3 C)-99.7 F (37.6 C)] 98.5 F (36.9 C) (11/18 0420) Pulse Rate:  [77-116] 86 (11/17 1750) Resp:  [13-27] 15 (11/18 0700) BP: (92-169)/(34-71) 99/53 mmHg (11/18 0700) SpO2:  [95 %-100 %] 96 % (11/18 0420) Weight:  [130 lb (58.968 kg)] 130 lb (58.968 kg) (11/17 1121) Last BM Date: 10/02/13 General:   Alert and oriented, pleasant Heart:  S1, S2 present, no murmurs noted.  Lungs: Clear to auscultation bilaterally, without wheezing, rales, or rhonchi.  Abdomen:  Bowel sounds present, soft, non-tender, non-distended. No HSM or hernias noted. No rebound or guarding. No masses appreciated  Extremities:  Without clubbing or edema. Neurologic:  Alert and  oriented x4;  grossly normal neurologically.   Intake/Output from previous day: 11/17 0701 - 11/18 0700 In: 1591.7 [I.V.:1091.7; Blood:500] Out: 200 [Urine:200] Intake/Output this shift:    Lab Results:  Recent Labs  10/02/13 1141 10/02/13 2151 10/03/13 0441  WBC 9.6 12.8* 10.4  HGB 5.3* 7.8* 7.2*  HCT 18.0* 24.2* 22.3*  PLT 380 272 234   BMET  Recent Labs  10/02/13 1141 10/03/13 0441  NA 141 143  K 4.2 3.4*  CL 109 113*  CO2 22 19  GLUCOSE 136* 95  BUN 41* 25*  CREATININE 0.61 0.68  CALCIUM 8.8 7.9*   LFT  Recent Labs  10/02/13 1141  PROT 6.3  ALBUMIN 3.2*  AST 20  ALT 16  ALKPHOS 76  BILITOT 0.6    Assessment: 79 year old female admitted with acute on chronic anemia, hematemesis, and melena. EGD 11/17 with multiple fresh-appearing [name] lesions, small gastric polyps not manipulated. Likely Sheria Lang lesions may have been culprit for GI bleeding but unable to exclude occult process such as Dieulafoy's. She has had no further evidence of active GI bleeding; Hgb is holding steady after 2 units of PRBCs. She overall feels much  improved from admission; may benefit from 1 additional unit of PRBCs today. Hopeful discharge soon if she continues to remain stable.   Plan: Short course of Carafate PPI daily: will switch to po Increase diet to low-residue for lunch Hopeful discharge soon if no further evidence of bleeding Consider an additional unit of PRBCs prior to discharge  Nira Retort, ANP-BC [facility] Gastroenterology    LOS: 1 day    10/03/2013, 8:13 AM

## 2013-10-04 ENCOUNTER — Encounter (HOSPITAL_COMMUNITY): Payer: Self-pay | Admitting: Internal Medicine

## 2013-10-04 ENCOUNTER — Telehealth: Payer: Self-pay | Admitting: General Practice

## 2013-10-04 DIAGNOSIS — K921 Melena: Secondary | ICD-10-CM

## 2013-10-04 DIAGNOSIS — K219 Gastro-esophageal reflux disease without esophagitis: Secondary | ICD-10-CM

## 2013-10-04 DIAGNOSIS — I10 Essential (primary) hypertension: Secondary | ICD-10-CM

## 2013-10-04 LAB — COMPREHENSIVE METABOLIC PANEL
ALT: 9 U/L (ref 0–35)
AST: 12 U/L (ref 0–37)
Albumin: 2.5 g/dL — ABNORMAL LOW (ref 3.5–5.2)
Alkaline Phosphatase: 56 U/L (ref 39–117)
BUN: 12 mg/dL (ref 6–23)
CO2: 24 mEq/L (ref 19–32)
Calcium: 8.1 mg/dL — ABNORMAL LOW (ref 8.4–10.5)
Chloride: 114 mEq/L — ABNORMAL HIGH (ref 96–112)
Creatinine, Ser: 0.73 mg/dL (ref 0.50–1.10)
GFR calc Af Amer: >90 mL/min (ref >90)
GFR calc non Af Amer: 80 mL/min — ABNORMAL LOW (ref >90)
Glucose, Bld: 99 mg/dL (ref 70–99)
Potassium: 4.1 mEq/L (ref 3.5–5.1)
Sodium: 145 mEq/L (ref 135–145)
Total Bilirubin: 0.8 mg/dL (ref 0.3–1.2)
Total Protein: 5 g/dL — ABNORMAL LOW (ref 6.0–8.3)

## 2013-10-04 LAB — CBC
HCT: 21.7 % — ABNORMAL LOW (ref 36.0–46.0)
HCT: 30.9 % — ABNORMAL LOW (ref 36.0–46.0)
Hemoglobin: 6.9 g/dL — CL (ref 12.0–15.0)
Hemoglobin: 10.1 g/dL — ABNORMAL LOW (ref 12.0–15.0)
MCH: 27.9 pg (ref 26.0–34.0)
MCH: 28.5 pg (ref 26.0–34.0)
MCHC: 31.8 g/dL (ref 30.0–36.0)
MCHC: 32.7 g/dL (ref 30.0–36.0)
MCV: 87.9 fL (ref 78.0–100.0)
MCV: 87.3 fL (ref 78.0–100.0)
Platelets: 223 10*3/uL (ref 150–400)
Platelets: 234 10*3/uL (ref 150–400)
RBC: 2.47 MIL/uL — ABNORMAL LOW (ref 3.87–5.11)
RBC: 3.54 MIL/uL — ABNORMAL LOW (ref 3.87–5.11)
RDW: 15.5 % (ref 11.5–15.5)
RDW: 15.3 % (ref 11.5–15.5)
WBC: 7.6 10*3/uL (ref 4.0–10.5)
WBC: 9.1 10*3/uL (ref 4.0–10.5)

## 2013-10-04 LAB — PREPARE RBC (CROSSMATCH)

## 2013-10-04 MED ORDER — PANTOPRAZOLE SODIUM 40 MG PO TBEC: 40.0000 mg | DELAYED_RELEASE_TABLET | Freq: Two times a day (BID) | ORAL | Status: DC

## 2013-10-04 MED ORDER — PANTOPRAZOLE SODIUM 40 MG IV SOLR
40.0000 mg | Freq: Two times a day (BID) | INTRAVENOUS | Status: DC
  Administered 2013-10-04 – 2013-10-05 (×3): 40 mg via INTRAVENOUS
  Filled 2013-10-04 (×3): qty 40

## 2013-10-04 MED ORDER — BISACODYL 10 MG RE SUPP
10.0000 mg | Freq: Once | RECTAL | Status: AC
  Administered 2013-10-04: 10 mg via RECTAL
  Filled 2013-10-04: qty 1

## 2013-10-04 NOTE — Telephone Encounter (Signed)
Toya Smothers called from [facility] and wanted to speak with Tobi Bastos.  Tobi Bastos has left for the day, so I transferred call to Tana Coast, PA-C

## 2013-10-04 NOTE — Progress Notes (Signed)
[facility] PROGRESS NOTE  Wanda [name] AVW:[id number] DOB: 05-30-1933 DOA: 10/02/2013 PCP: Ninfa Linden, FNP  Assessment/Plan: Upper GI bleeding, thought to be secondary to multiple gastric erosions, consistent with Sheria Lang lesions, per Dr. Jena Gauss. Per his assessment, it is likely that these lesions were responsible for the upper GI bleeding, however, he was not certain if the lesions found in the stoma would totally explain a 4 g drop in her hemoglobin. An occult coexisting Dieulafoy lesion would also remain a possibility. Will increase po Protonix to BID and continue Carafate. Hg dropped to 6.9. Will transfuse 2 units PRBC's. Monitor for over bleeding. Appreciate GI assistance   Previously known gastric polyps and large hiatal hernia per EGD.   Acute blood loss anemia secondary to upper GI bleed superimposed on known iron deficiency anemia. Status post 2 units of packed red blood cells on admission. Her hemoglobin had improved appropriately. This am dropped to 6.9. Will transfuse 2 more units PRBC's. Monitor for overt bleeding.   Sinus tachycardia and low-normal blood pressure secondary to volume depletion/dehydration. Resolved. Will decrease IV rate given transfusions planned today. Patient tolerating po fluids as well.    History of hypertension. Lisinopril is on hold for low-normal blood pressures. Continue to hold for now   Hypokalemia. Likely secondary to IV fluids and the dilutional effects. Resolved.     Code Status: full Family Communication: none present Disposition Plan: home hopefully tomorrow   Consultants:  GI Dr Jena Gauss  Procedures:  EGD 10/02/13  Antibiotics:  none  HPI/Subjective: Complains headache she attributes to constipation. Otherwise denies discomfort  Objective: Filed Vitals:   10/04/13 0500  BP: 110/56  Pulse: 68  Temp: 98.3 F (36.8 C)  Resp: 17    Intake/Output Summary (Last 24 hours) at 10/04/13 0920 Last data filed at  10/03/13 2341  Gross per 24 hour  Intake    765 ml  Output    850 ml  Net    -85 ml   Filed Weights   10/02/13 1121  Weight: 58.968 kg (130 lb)    Exam:   General:  Well nourished NAD  Cardiovascular: RRR No MGR No LE edema  Respiratory: normal effort BS clear bilaterally to auscultation. No wheeze no rhonchi  Abdomen: soft +BS non-tender to palpation  Musculoskeletal: normal muscle tone MAE   Data Reviewed: Basic Metabolic Panel:  Recent Labs Lab 10/02/13 1141 10/03/13 0441 10/04/13 0531  NA 141 143 145  K 4.2 3.4* 4.1  CL 109 113* 114*  CO2 22 19 24   GLUCOSE 136* 95 99  BUN 41* 25* 12  CREATININE 0.61 0.68 0.73  CALCIUM 8.8 7.9* 8.1*   Liver Function Tests:  Recent Labs Lab 10/02/13 1141 10/04/13 0531  AST 20 12  ALT 16 9  ALKPHOS 76 56  BILITOT 0.6 0.8  PROT 6.3 5.0*  ALBUMIN 3.2* 2.5*   No results found for this basename: LIPASE, AMYLASE,  in the last 168 hours No results found for this basename: AMMONIA,  in the last 168 hours CBC:  Recent Labs Lab 10/02/13 1141 10/02/13 2151 10/03/13 0441 10/03/13 1050 10/03/13 1707 10/04/13 0531  WBC 9.6 12.8* 10.4 10.6* 10.1 7.6  NEUTROABS 8.2*  --   --   --   --   --   HGB 5.3* 7.8* 7.2* 8.1* 8.3* 6.9*  HCT 18.0* 24.2* 22.3* 25.2* 25.9* 21.7*  MCV 87.4 86.4 86.4 87.2 87.5 87.9  PLT 380 272 234 257 287 223   Cardiac Enzymes:  No results found for this basename: CKTOTAL, CKMB, CKMBINDEX, TROPONINI,  in the last 168 hours BNP (last 3 results) No results found for this basename: PROBNP,  in the last 8760 hours CBG: No results found for this basename: GLUCAP,  in the last 168 hours  Recent Results (from the past 240 hour(s))  MRSA PCR SCREENING     Status: None   Collection Time    10/02/13  2:13 PM      Result Value Range Status   MRSA by PCR NEGATIVE  NEGATIVE Final   Comment:            The GeneXpert MRSA Assay (FDA     approved for NASAL specimens     only), is one component of a      comprehensive MRSA colonization     surveillance program. It is not     intended to diagnose MRSA     infection nor to guide or     monitor treatment for     MRSA infections.     Studies: No results found.  Scheduled Meds: . bisacodyl  10 mg Rectal Once  . ferrous sulfate  325 mg Oral Q M,W,F  . pantoprazole  40 mg Oral BID  . sucralfate  1 g Oral TID WC & HS   Continuous Infusions: . 0.9 % NaCl with KCl 20 mEq / L 75 mL/hr at 10/04/13 0131    Principal Problem:   Acute blood loss anemia Active Problems:   GERD   GI bleed   HTN (hypertension)   Diverticulosis   IDA (iron deficiency anemia)   Melena   Hematemesis   Sinus tachycardia   Multiple gastric erosions   Hiatal hernia   Gastric polyps    Time spent: 30 minutes    [facility]  [facility]  If 7PM-7AM, please contact night-coverage at [email or web address], password [facility] 10/04/2013, 9:20 AM  LOS: 2 days

## 2013-10-04 NOTE — Progress Notes (Signed)
Patient is a pleasant 79 year old female with a past medical history of hypertension, gastroesophageal reflux disease, who was admitted on 10/02/2013 at which time she presented with complaints of melena and coffee-ground emesis. Gastroenterology was consulted as she underwent EGD on 10/02/2013, found to have multiple fresh-appearing Sheria Lang lesions most likely responsible for upper GI bleed. She required transfusion with 2 units of packed red blood cells initially. This morning she reported having an episode of melena though has not had further episodes of coffee-ground emesis. Her hemoglobin did drop from 8.3 on 10/03/2013 to 6.9 on this mornings lab work. Patient was ordered 2 units of packed red blood cells, as she was placed back on IV Protonix 40 mg twice a day. Will followup on a CBC this evening. Plan was discussed with Toya Smothers FNP agree with her findings.,

## 2013-10-04 NOTE — Progress Notes (Addendum)
Subjective: Uses Dulcolax suppository at home for constipation, feels constipated now. No melena, hematochezia. No abdominal pain, tolerating diet. Has headache now, which she states happens when she is constipated.   Objective: Vital signs in last 24 hours: Temp:  [98.2 F (36.8 C)-99.4 F (37.4 C)] 98.3 F (36.8 C) (11/19 0500) Pulse Rate:  [68-74] 68 (11/19 0500) Resp:  [15-22] 17 (11/19 0500) BP: (73-140)/(36-85) 110/56 mmHg (11/19 0500) SpO2:  [98 %-99 %] 99 % (11/19 0500) Last BM Date: 10/02/13 General:   Alert and oriented, pleasant Heart:  S1, S2 present, no murmurs noted.  Lungs: Clear to auscultation bilaterally, without wheezing, rales, or rhonchi.  Abdomen:  Bowel sounds present, soft, non-tender, non-distended. No HSM or hernias noted. No rebound or guarding. No masses appreciated  Extremities:  Without clubbing or edema. Neurologic:  Alert and  oriented x4;  grossly normal neurologically.   Intake/Output from previous day: 11/18 0701 - 11/19 0700 In: 1045 [I.V.:1045] Out: 850 [Urine:850] Intake/Output this shift:    Lab Results:  Recent Labs  10/03/13 1050 10/03/13 1707 10/04/13 0531  WBC 10.6* 10.1 7.6  HGB 8.1* 8.3* 6.9*  HCT 25.2* 25.9* 21.7*  PLT 257 287 223   BMET  Recent Labs  10/02/13 1141 10/03/13 0441 10/04/13 0531  NA 141 143 145  K 4.2 3.4* 4.1  CL 109 113* 114*  CO2 22 19 24   GLUCOSE 136* 95 99  BUN 41* 25* 12  CREATININE 0.61 0.68 0.73  CALCIUM 8.8 7.9* 8.1*   LFT  Recent Labs  10/02/13 1141 10/04/13 0531  PROT 6.3 5.0*  ALBUMIN 3.2* 2.5*  AST 20 12  ALT 16 9  ALKPHOS 76 56  BILITOT 0.6 0.8    Assessment: 79 year old female admitted with acute on chronic anemia, hematemesis, and melena. EGD 11/17 with multiple fresh-appearing [name] lesions, small gastric polyps not manipulated. Likely Sheria Lang lesions may have been culprit for GI bleeding but unable to exclude occult process such as Dieulafoy's.   This morning, drop  in Hgb to 6.9. Yesterday morning, Hgb was in the 7 range (actually improved to 8 yesterday afternoon). She has no signs of overt GI bleeding; likely multifactorial, question a dilutional component. Would benefit from 1 additional unit of PRBCs this morning, with close monitoring of H/H.   Constipation: provide dulcolax suppository X 1 now, which patient uses at home with good results. Tylenol for headache.   Plan: Protonix daily Avoidance of all NSAIDs indefinitely Dulcolax suppository X 1 now Tylenol for headache 1 unit PRBCs Follow H/H Monitor for overt GI bleeding  Nira Retort, ANP-BC [facility] Gastroenterology     LOS: 2 days    10/04/2013, 7:54 AM   ADDENDUM at 0825: Appears Hospitalist has already ordered 2 units PRBCs. Agree. I have cancelled the 1 unit PRBCs that I ordered. Continue to follow as previously outlined.   We'll plan an early office followup. Patient may also need an updated hematology evaluation.

## 2013-10-04 NOTE — Progress Notes (Signed)
CRITICAL VALUE ALERT  Critical value received: HbB 6.9  Date of notification:  10/04/13  Time of notification: 0602  Critical value read back:yes  Nurse who received alert:  Linwood Dibbles  MD notified (1st page):  Dr Onalee Hua  Time of first page:  0654  MD notified (2nd page):  Time of second page:  Responding MD:   Time MD responded:

## 2013-10-05 ENCOUNTER — Encounter (HOSPITAL_COMMUNITY): Payer: Self-pay

## 2013-10-05 ENCOUNTER — Telehealth: Payer: Self-pay | Admitting: Gastroenterology

## 2013-10-05 DIAGNOSIS — K922 Gastrointestinal hemorrhage, unspecified: Secondary | ICD-10-CM

## 2013-10-05 DIAGNOSIS — D509 Iron deficiency anemia, unspecified: Secondary | ICD-10-CM

## 2013-10-05 DIAGNOSIS — D649 Anemia, unspecified: Secondary | ICD-10-CM

## 2013-10-05 LAB — TYPE AND SCREEN
ABO/RH(D): O POS
Antibody Screen: NEGATIVE
Unit division: 0
Unit division: 0
Unit division: 0
Unit division: 0

## 2013-10-05 LAB — CBC
HCT: 31.8 % — ABNORMAL LOW (ref 36.0–46.0)
Hemoglobin: 10.5 g/dL — ABNORMAL LOW (ref 12.0–15.0)
MCH: 28.8 pg (ref 26.0–34.0)
MCHC: 33 g/dL (ref 30.0–36.0)
MCV: 87.1 fL (ref 78.0–100.0)
Platelets: 222 10*3/uL (ref 150–400)
RBC: 3.65 MIL/uL — ABNORMAL LOW (ref 3.87–5.11)
RDW: 15.2 % (ref 11.5–15.5)
WBC: 10.6 10*3/uL — ABNORMAL HIGH (ref 4.0–10.5)

## 2013-10-05 MED ORDER — PANTOPRAZOLE SODIUM 40 MG PO TBEC: DELAYED_RELEASE_TABLET | ORAL | Status: DC

## 2013-10-05 MED ORDER — SUCRALFATE 1 G PO TABS: 1.0000 g | ORAL_TABLET | Freq: Three times a day (TID) | ORAL | Status: DC

## 2013-10-05 MED ORDER — LORATADINE 10 MG PO TABS
10.0000 mg | ORAL_TABLET | Freq: Every day | ORAL | Status: DC
  Administered 2013-10-05: 10 mg via ORAL
  Filled 2013-10-05: qty 1

## 2013-10-05 MED ORDER — LORATADINE 10 MG PO TABS: 10.0000 mg | ORAL_TABLET | Freq: Every day | ORAL | Status: DC

## 2013-10-05 NOTE — Progress Notes (Signed)
Subjective:  Patient wants to go home. She had small stool last night which was a little lighter than before. Denies abdominal pain. Tolerating diet. Answered multiple questions.   Objective: Vital signs in last 24 hours: Temp:  [97.8 F (36.6 C)-99.4 F (37.4 C)] 98.6 F (37 C) (11/20 0437) Pulse Rate:  [77-83] 77 (11/20 0437) Resp:  [18] 18 (11/20 0437) BP: (126-154)/(59-93) 137/64 mmHg (11/20 0437) SpO2:  [95 %-99 %] 96 % (11/20 0437) Last BM Date: 10/04/13 General:   Alert,  Well-developed, well-nourished, pleasant and cooperative in NAD Head:  Normocephalic and atraumatic. Eyes:  Sclera clear, no icterus.   Abdomen:  Soft, nontender and nondistended.   Extremities:  Without edema. Neurologic:  Alert and  oriented x4;  grossly normal neurologically. Skin:  Intact without significant lesions or rashes. Psych:  Alert and cooperative. Normal mood and affect.  Intake/Output from previous day: 11/19 0701 - 11/20 0700 In: 3261.8 [P.O.:710; I.V.:1926.8; Blood:625] Out: 2451 [Urine:2450; Stool:1] Intake/Output this shift:    Lab Results: CBC  Recent Labs  10/04/13 0531 10/04/13 1812 10/05/13 0513  WBC 7.6 9.1 10.6*  HGB 6.9* 10.1* 10.5*  HCT 21.7* 30.9* 31.8*  MCV 87.9 87.3 87.1  PLT 223 234 222   BMET  Recent Labs  10/02/13 1141 10/03/13 0441 10/04/13 0531  NA 141 143 145  K 4.2 3.4* 4.1  CL 109 113* 114*  CO2 22 19 24   GLUCOSE 136* 95 99  BUN 41* 25* 12  CREATININE 0.61 0.68 0.73  CALCIUM 8.8 7.9* 8.1*   LFTs  Recent Labs  10/02/13 1141 10/04/13 0531  BILITOT 0.6 0.8  ALKPHOS 76 56  AST 20 12  ALT 16 9  PROT 6.3 5.0*  ALBUMIN 3.2* 2.5*   No results found for this basename: LIPASE,  in the last 72 hours PT/INR No results found for this basename: LABPROT, INR,  in the last 72 hours    Imaging Studies: No results found.[2 weeks]   Assessment: 79 year old female admitted with acute on chronic anemia, hematemesis, and melena. EGD 11/17 with  multiple fresh-appearing [name] lesions, small gastric polyps not manipulated. Likely Sheria Lang lesions may have been culprit for GI bleeding but unable to exclude occult process such as Dieulafoy's.   Hgb up after additional transfusion yesterday. She has no signs of overt GI bleeding.  Plan: 1. Stable for discharge from GI standpoint.  2. We will make arrangements for short-interval office follow-up.   LOS: 3 days   Tana Coast  10/05/2013, 8:56 AM

## 2013-10-05 NOTE — Telephone Encounter (Addendum)
Patient has a follow-up appointment on 10/31/13 at 10am. Please make sure she is aware. Please make arrangements for her to have CBC done week of December 8th.

## 2013-10-05 NOTE — Discharge Summary (Signed)
Physician Discharge Summary  Wanda [name] ZOX:[id number] DOB: Jul 13, 1933 DOA: 10/02/2013  PCP: Ninfa Linden, FNP  Admit date: 10/02/2013 Discharge date: 10/05/2013  Time spent: 40 minutes  Recommendations for Outpatient Follow-up:  1. Follow up with PCP 2 weeks for evaluation of symptoms. Recommend CBC to trend Hg 2. Dr Luvenia Starch office will contact to schedule follow up appointment Discharge Diagnoses:  Principal Problem:   Acute blood loss anemia Active Problems:   GERD   GI bleed   HTN (hypertension)   Diverticulosis   IDA (iron deficiency anemia)   Melena   Hematemesis   Sinus tachycardia   Multiple gastric erosions   Hiatal hernia   Gastric polyps   Discharge Condition: stable  Diet recommendation: low fiber  Filed Weights   10/02/13 1121  Weight: 58.968 kg (130 lb)    History of present illness:  Wanda [name] is a very pleasant 79 y.o. female past medical history that includes iron deficiency anemia, diverticulosis, hypertension, GERD, presented to the emergency department on 10/02/13 with the chief complaint of melena and coffee-ground emesis. Information is obtained from the patient and the daughter-in-law who is at the bedside. Patient reported being in her usual state of health until the morning when she was awakened feeling nauseated in the middle the night. She stated she had a very dark Wanda [name] stool. She currently takes iron supplements and therefore she was not concerned at the time. She then went back to sleep when she awakened she felt dizzy with some shortness of breath and weakness. She stated I felt like I might pass out. She then had one episode of moderate amount coffee-ground emesis. She denied history of GI bleed. She denied taking aspirin or any other NSAID except for 2 days ago she had a headache and took 2 Advil. She denied any chest pain palpitations. She denied any abdominal pain or diarrhea. She denied dysuria hematuria frequency or  urgency. She reported no unintentional weight loss. Of note she has had a fairly extensive GI workup for anemia that she said she has had "for a very long time". Initial evaluation in the emergency room significant for a hemoglobin of 5.3, hematocrit 18.0, MCV 87.4, BUN 41 and creatinine 0.61, glucose 136, was heme positive . Vital signs were stable but she was a little tachycardic with heart rate range of 99 to 115.She was given 1 L of normal saline and 40 mg of protonix IV. She was also given one dose of Zofran. There have been no further episodes of dark stool or emesis. In the emergency room gastroenterology was consulted. 2 units of packed red blood cells were ordered. GI planned EGD after the first unit of packed red blood cells.   Hospital Course:  Upper GI bleeding, thought to be secondary to multiple gastric erosions, consistent with Wanda [name] lesions, per Dr. Jena Gauss. Per his assessment, it is likely that these lesions were responsible for the upper GI bleeding, however, he was not certain if the lesions found in the stoma would totally explain a 4 g drop in her hemoglobin. An occult coexisting Dieulafoy lesion would also remain a possibility. Given IV  Protonix  BID and Carafate. Hg dropped to 6.9 on 10/01/13 and patient transfused 2 units PRBC's for a total of 4 units this hospitalization. Will be discharged with Protonix and carafate. Has been instructed to avoid NSAIDS, asa, goody powders as well as spiced food and carbonated beverages. Dr. Jeanella Flattery office will contact patient for follow up appointment.  Previously known gastric polyps and large hiatal hernia per EGD.   Acute blood loss anemia secondary to upper GI bleed superimposed on known iron deficiency anemia. Status post 4 units of packed red blood cells during this hospitalizaton. At discharge Hg 10.5. Recommend follow up with PCP 1 week for evaluation of symptoms and cbc to trend Hg.    Sinus tachycardia and low-normal blood pressure  secondary to volume depletion/dehydration. Resolved.. Patient tolerating low residue diet without problem.    History of hypertension. Lisinopril held initially for low-normal blood pressures. Resumed at discharge.    Hypokalemia. Likely secondary to IV fluids and the dilutional effects. Resolved.   Procedures: EGD 10/02/13:Multiple, fresh appearing Wanda [name] lesions most likely  responsible for upper GI bleeding. However, This may be a secondary  phenomenon from the nonspecific symptoms of nausea and vomiting.  Small gastric polyps-not manipulated.  I am uncertain as to whether the lesions found in the stomach today  totally explain a 4 g drop in hemoglobin. An occult coexisting  Dieulafoy lesion would also remain a possibility  Consultations:  Dr Jena Gauss GI  Discharge Exam: Filed Vitals:   10/05/13 0437  BP: 137/64  Pulse: 77  Temp: 98.6 F (37 C)  Resp: 18    General: well nourished NAD Cardiovascular: RRR No MGR No LE edema Respiratory: normal effort BS clear bilaterally no wheeze no rhonchi Abdomen: round but soft +BS throughout. Non-tender to palpation  Discharge Instructions  Discharge Orders   Future Orders Complete By Expires   Diet - low sodium heart healthy  As directed    Discharge instructions  As directed    Comments:     Dr. Jeanella Flattery office will contact you for follow up appointment. Avoid NSAIDS, aspirin, goody powders Avoid food with spice and carbonated drinks If symptoms reoccur seek immediate medical attention.   Increase activity slowly  As directed        Medication List    STOP taking these medications       ibuprofen 200 MG tablet  Commonly known as:  ADVIL,MOTRIN     RANITIDINE HCL PO      TAKE these medications       Ferrous Sulfate 27 MG Tabs  Take 1 tablet by mouth 3 (three) times a week.     lisinopril 20 MG tablet  Commonly known as:  PRINIVIL,ZESTRIL  Take 20 mg by mouth daily.     loratadine 10 MG tablet  Commonly known  as:  CLARITIN  Take 1 tablet (10 mg total) by mouth daily.     pantoprazole 40 MG tablet  Commonly known as:  PROTONIX  Take twice daily for 30 days then once daily.     sucralfate 1 G tablet  Commonly known as:  CARAFATE  Take 1 tablet (1 g total) by mouth 4 (four) times daily -  with meals and at bedtime.     Vitamin D 2000 UNITS Caps  Take 1 capsule by mouth 3 (three) times a week.       Allergies  Allergen Reactions  . Naproxen Sodium Rash and Other (See Comments)    Passed out       Follow-up Information   Follow up with Ninfa Linden, FNP. Schedule an appointment as soon as possible for a visit in 2 weeks. (for evlauation of symptoms and recommend CBC to trend Hg)    Specialty:  Nurse Practitioner   Contact information:   [phone] [place]  [address] [phone]       Follow up with Eula Listen, MD. (office will contact you for appointment for follow up)    Specialty:  Gastroenterology   Contact information:   [address] [place] [address] [phone]        The results of significant diagnostics from this hospitalization (including imaging, microbiology, ancillary and laboratory) are listed below for reference.    Significant Diagnostic Studies: No results found.  Microbiology: Recent Results (from the past 240 hour(s))  MRSA PCR SCREENING     Status: None   Collection Time    10/02/13  2:13 PM      Result Value Range Status   MRSA by PCR NEGATIVE  NEGATIVE Final   Comment:            The GeneXpert MRSA Assay (FDA     approved for NASAL specimens     only), is one component of a     comprehensive MRSA colonization     surveillance program. It is not     intended to diagnose MRSA     infection nor to guide or     monitor treatment for     MRSA infections.     Labs: Basic Metabolic Panel:  Recent Labs Lab 10/02/13 1141 10/03/13 0441 10/04/13 0531  NA 141 143 145  K 4.2 3.4*  4.1  CL 109 113* 114*  CO2 22 19 24   GLUCOSE 136* 95 99  BUN 41* 25* 12  CREATININE 0.61 0.68 0.73  CALCIUM 8.8 7.9* 8.1*   Liver Function Tests:  Recent Labs Lab 10/02/13 1141 10/04/13 0531  AST 20 12  ALT 16 9  ALKPHOS 76 56  BILITOT 0.6 0.8  PROT 6.3 5.0*  ALBUMIN 3.2* 2.5*   No results found for this basename: LIPASE, AMYLASE,  in the last 168 hours No results found for this basename: AMMONIA,  in the last 168 hours CBC:  Recent Labs Lab 10/02/13 1141  10/03/13 1050 10/03/13 1707 10/04/13 0531 10/04/13 1812 10/05/13 0513  WBC 9.6  < > 10.6* 10.1 7.6 9.1 10.6*  NEUTROABS 8.2*  --   --   --   --   --   --   HGB 5.3*  < > 8.1* 8.3* 6.9* 10.1* 10.5*  HCT 18.0*  < > 25.2* 25.9* 21.7* 30.9* 31.8*  MCV 87.4  < > 87.2 87.5 87.9 87.3 87.1  PLT 380  < > 257 287 223 234 222  < > = values in this interval not displayed. Cardiac Enzymes: No results found for this basename: CKTOTAL, CKMB, CKMBINDEX, TROPONINI,  in the last 168 hours BNP: BNP (last 3 results) No results found for this basename: PROBNP,  in the last 8760 hours CBG: No results found for this basename: GLUCAP,  in the last 168 hours     Signed:  Gwenyth Bender  [facility] 10/05/2013, 9:15 AM

## 2013-10-05 NOTE — Discharge Summary (Signed)
Patient was seen and evaluated with Toya Smothers NP. Lab work was reviewed as her hemoglobin has remained stable at 10.5 and 10.1 and 3 see me in 2 units of packed red blood cells yesterday. She reported having small dark stool overnight which was lighter than before. Patient remaining hemodynamically stable, mentating well, denied dizziness or lightheadedness. She did complain of a headache attributed this to her sinuses. Otherwise gastroenterology evaluated her on the day of discharge, recommended discharge from a GI standpoint. Patient also expresses are to be discharged today. Plan was reviewed with Toya Smothers NP, agree with her findings, patient discharged today in stable condition.

## 2013-10-05 NOTE — Discharge Planning (Signed)
Pt stated she was ready to be DC and her pain was under control.  Pt's IV and tele were removed.  She given DC papers and education with family in room.  Pt was educated on S/SX to be aware of with future anemia or low BP, indicating reasons to call doctor to return to hospital.  Given script and told the others were sent to electronically to pharm. Pt will be wheeled to car by RN and family when ready.

## 2013-10-10 ENCOUNTER — Other Ambulatory Visit: Payer: Self-pay | Admitting: Gastroenterology

## 2013-10-10 ENCOUNTER — Other Ambulatory Visit: Payer: Self-pay

## 2013-10-10 DIAGNOSIS — D509 Iron deficiency anemia, unspecified: Secondary | ICD-10-CM

## 2013-10-10 NOTE — Telephone Encounter (Signed)
Lab order mailed to pt.

## 2013-10-17 LAB — CBC WITH DIFFERENTIAL/PLATELET
Basophils Absolute: 0 10*3/uL (ref 0.0–0.1)
Basophils Relative: 1 % (ref 0–1)
Eosinophils Absolute: 0.2 10*3/uL (ref 0.0–0.7)
Eosinophils Relative: 3 % (ref 0–5)
HCT: 33.4 % — ABNORMAL LOW (ref 36.0–46.0)
Hemoglobin: 10.7 g/dL — ABNORMAL LOW (ref 12.0–15.0)
Lymphocytes Relative: 25 % (ref 12–46)
Lymphs Abs: 1.9 10*3/uL (ref 0.7–4.0)
MCH: 27.3 pg (ref 26.0–34.0)
MCHC: 32 g/dL (ref 30.0–36.0)
MCV: 85.2 fL (ref 78.0–100.0)
Monocytes Absolute: 0.6 10*3/uL (ref 0.1–1.0)
Monocytes Relative: 7 % (ref 3–12)
Neutro Abs: 4.8 10*3/uL (ref 1.7–7.7)
Neutrophils Relative %: 64 % (ref 43–77)
Platelets: 475 10*3/uL — ABNORMAL HIGH (ref 150–400)
RBC: 3.92 MIL/uL (ref 3.87–5.11)
RDW: 16.4 % — ABNORMAL HIGH (ref 11.5–15.5)
WBC: 7.5 10*3/uL (ref 4.0–10.5)

## 2013-10-19 ENCOUNTER — Ambulatory Visit: Payer: Self-pay | Admitting: Oncology

## 2013-10-25 LAB — CBC CANCER CENTER
Basophil #: 0.1 x10 3/mm (ref 0.0–0.1)
Basophil %: 1 %
Eosinophil #: 0.2 x10 3/mm (ref 0.0–0.7)
Eosinophil %: 2.1 %
HCT: 36.8 % (ref 35.0–47.0)
HGB: 11.9 g/dL — ABNORMAL LOW (ref 12.0–16.0)
Lymphocyte #: 1.6 x10 3/mm (ref 1.0–3.6)
Lymphocyte %: 17.5 %
MCH: 28.1 pg (ref 26.0–34.0)
MCHC: 32.3 g/dL (ref 32.0–36.0)
MCV: 87 fL (ref 80–100)
Monocyte #: 0.4 x10 3/mm (ref 0.2–0.9)
Monocyte %: 4.9 %
Neutrophil #: 6.8 x10 3/mm — ABNORMAL HIGH (ref 1.4–6.5)
Neutrophil %: 74.5 %
Platelet: 380 x10 3/mm (ref 150–440)
RBC: 4.23 10*6/uL (ref 3.80–5.20)
RDW: 16.7 % — ABNORMAL HIGH (ref 11.5–14.5)
WBC: 9.2 x10 3/mm (ref 3.6–11.0)

## 2013-10-25 LAB — IRON AND TIBC
Iron Bind.Cap.(Total): 472 ug/dL — ABNORMAL HIGH (ref 250–450)
Iron Saturation: 9 %
Iron: 44 ug/dL — ABNORMAL LOW (ref 50–170)
Unbound Iron-Bind.Cap.: 428 ug/dL

## 2013-10-25 LAB — FOLATE: Folic Acid: 16.3 ng/mL (ref 3.1–100.0)

## 2013-10-25 LAB — RETICULOCYTES
Absolute Retic Count: 0.0466 10*6/uL (ref 0.019–0.186)
Reticulocyte: 1.12 % (ref 0.4–3.1)

## 2013-10-25 LAB — TSH: Thyroid Stimulating Horm: 1.78 u[IU]/mL

## 2013-10-25 LAB — FERRITIN: Ferritin (ARMC): 12 ng/mL (ref 8–388)

## 2013-10-25 LAB — SEDIMENTATION RATE: Erythrocyte Sed Rate: 47 mm/hr — ABNORMAL HIGH (ref 0–30)

## 2013-10-25 LAB — T4, FREE: Free Thyroxine: 0.84 ng/dL (ref 0.76–1.46)

## 2013-10-25 LAB — LACTATE DEHYDROGENASE: LDH: 228 U/L (ref 81–246)

## 2013-10-26 ENCOUNTER — Ambulatory Visit: Payer: Self-pay | Admitting: Oncology

## 2013-10-27 LAB — PROT IMMUNOELECTROPHORES(ARMC)

## 2013-10-30 ENCOUNTER — Telehealth: Payer: Self-pay | Admitting: Internal Medicine

## 2013-10-30 NOTE — Telephone Encounter (Signed)
Pt was on the schedule to see LSL this week, but due to death in [place] family we are having to [facility] patients. I explained this to patient and offered her the next available with LSL and patient got a little frustrated that she would have to wait that long and just wanted to let RMR know that she is following up with a hematologist. She asked for me to find out from nurse and/or RMR what does he recommend and if a FU was necessary. [phone]

## 2013-10-30 NOTE — Telephone Encounter (Signed)
Glad she is seeing the hematologist. She still needs followup with a gastroenterologist. For now, let's have her return a stool sample for occult blood testing and arrange an office followup appointment in January 2015

## 2013-10-31 ENCOUNTER — Inpatient Hospital Stay: Payer: Medicare Other | Admitting: Gastroenterology

## 2013-10-31 NOTE — Telephone Encounter (Signed)
Tried to call pt- LMOM 

## 2013-11-01 ENCOUNTER — Ambulatory Visit: Payer: Medicare Other | Admitting: Gastroenterology

## 2013-11-01 NOTE — Telephone Encounter (Signed)
Pt is aware and ifobt has been mailed to the pt.

## 2013-11-01 NOTE — Telephone Encounter (Signed)
Pt is aware of OV on 1/13 at 0900 with RMR and appt card was mailed

## 2013-11-16 ENCOUNTER — Ambulatory Visit: Payer: Self-pay | Admitting: Oncology

## 2013-11-20 ENCOUNTER — Ambulatory Visit (INDEPENDENT_AMBULATORY_CARE_PROVIDER_SITE_OTHER): Payer: Medicare Other | Admitting: Internal Medicine

## 2013-11-20 DIAGNOSIS — D649 Anemia, unspecified: Secondary | ICD-10-CM

## 2013-11-20 LAB — IFOBT (OCCULT BLOOD): IFOBT: NEGATIVE

## 2013-11-20 NOTE — Progress Notes (Signed)
Pt return IFOBT test and it was NEGATIVE,

## 2013-11-22 LAB — CBC CANCER CENTER
Basophil #: 0.1 x10 3/mm (ref 0.0–0.1)
Basophil %: 1.2 %
Eosinophil #: 0.2 x10 3/mm (ref 0.0–0.7)
Eosinophil %: 2.9 %
HCT: 38.9 % (ref 35.0–47.0)
HGB: 12.8 g/dL (ref 12.0–16.0)
Lymphocyte #: 2.3 x10 3/mm (ref 1.0–3.6)
Lymphocyte %: 27.2 %
MCH: 28 pg (ref 26.0–34.0)
MCHC: 32.8 g/dL (ref 32.0–36.0)
MCV: 86 fL (ref 80–100)
Monocyte #: 0.6 x10 3/mm (ref 0.2–0.9)
Monocyte %: 6.9 %
Neutrophil #: 5.1 x10 3/mm (ref 1.4–6.5)
Neutrophil %: 61.8 %
Platelet: 342 x10 3/mm (ref 150–440)
RBC: 4.56 10*6/uL (ref 3.80–5.20)
RDW: 17.5 % — ABNORMAL HIGH (ref 11.5–14.5)
WBC: 8.3 x10 3/mm (ref 3.6–11.0)

## 2013-11-27 ENCOUNTER — Encounter (INDEPENDENT_AMBULATORY_CARE_PROVIDER_SITE_OTHER): Payer: Self-pay

## 2013-11-28 ENCOUNTER — Ambulatory Visit (INDEPENDENT_AMBULATORY_CARE_PROVIDER_SITE_OTHER): Payer: Medicare Other | Admitting: Internal Medicine

## 2013-11-28 ENCOUNTER — Encounter: Payer: Self-pay | Admitting: Internal Medicine

## 2013-11-28 VITALS — BP 151/76 | HR 81 | Temp 97.6°F | Wt 132.2 lb

## 2013-11-28 DIAGNOSIS — K219 Gastro-esophageal reflux disease without esophagitis: Secondary | ICD-10-CM

## 2013-11-28 DIAGNOSIS — D509 Iron deficiency anemia, unspecified: Secondary | ICD-10-CM

## 2013-11-28 NOTE — Progress Notes (Signed)
Primary Care Physician:  No primary provider on file. Primary Gastroenterologist:  Dr. Gala Romney  Pre-Procedure History & Physical: HPI:  Wanda [name] is a 80 y.o. female here for followup iron deficiency anemia and GERD. Over the past 2 years she's had a thorough GI evaluation for IDA (EGD/colonoscopy/capsule study and CT). Benign pulmonary nodule  - followup dedicated chest CT done. 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 Currently on Carafate and protonix.   Patient takes iron and is seeing a hematologist down at [facility].  Most recent labs from 11/22/2013 reveal an H&H 12.8 and 38.9 MCV 86 white count 8.3. Patient is currently devoid of any GI symptoms.  Hemoccult negative stool last month.  Past Medical History  Diagnosis Date  . IDA (iron deficiency anemia)   . GERD (gastroesophageal reflux disease)   . Gastritis 12/08/10    EGD by Dr. Altha Harm hiatal hernia, duodenal diverticulum, chronic gastritis  . Diverticulosis 10/20/10    Left-sided on colonoscopy by Dr. Gala Romney 10/20/10  . HTN (hypertension)   . Vitamin D deficiency   . Osteoarthritis   . Sleep apnea     STOP BANG SCORE 4  . Cystocele   . Urge incontinence   . Radial fracture     (right) Undergoing treatment by Dr. Aline Brochure currently  . 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 Hiatal hernia 10/02/2013  . Gastric polyps 10/02/2013  . Heart murmur     Past Surgical History  Procedure Laterality Date  . Colonoscopy  02/29/2012    Dr. Gala Romney: colonic diverticulosis, minimal internal hemorrhoids, benign polyp  . Esophagogastroduodenoscopy  02/29/2012    Dr. Gala Romney: atonic esophagus, moderate-sized hiatal hernia, fundal gland polyps  . Givens capsule study  02/29/2012    No explanation for IDA. Possible extrinsic compression but negative CT.   . Vaginal hysterectomy  05/17/2012    Procedure: HYSTERECTOMY VAGINAL;  Surgeon: Jonnie Kind, MD;  Location: [facility];  Service: Gynecology;  Laterality: N/A;  . Anterior and  posterior repair  05/17/2012    Procedure: ANTERIOR (CYSTOCELE) AND POSTERIOR REPAIR (RECTOCELE);  Surgeon: Jonnie Kind, MD;  Location: [facility];  Service: Gynecology;  Laterality: N/A;  . Salpingoophorectomy  05/17/2012    Procedure: SALPINGO OOPHERECTOMY;  Surgeon: Jonnie Kind, MD;  Location: [facility];  Service: Gynecology;  Laterality: Bilateral;  . Esophagogastroduodenoscopy N/A 10/02/2013    Dr. Gala Romney- multiple Wilhemena Durie gastric polyps- not manipulated    Prior to Admission medications   Medication Sig Start Date End Date Taking? Authorizing Provider  Cholecalciferol (VITAMIN D) 2000 UNITS CAPS Take 1 capsule by mouth 3 (three) times a week.   Yes Historical Provider, MD  Ferrous Sulfate 27 MG TABS Take 1 tablet by mouth 3 (three) times a week.   Yes Historical Provider, MD  lisinopril-hydrochlorothiazide (PRINZIDE,ZESTORETIC) 20-12.5 MG per tablet Take 1 tablet by mouth daily.   Yes Historical Provider, MD  loratadine (CLARITIN) 10 MG tablet Take 1 tablet (10 mg total) by mouth daily. 10/05/13  Yes Lezlie Octave Black, NP  pantoprazole (PROTONIX) 40 MG tablet Take twice daily for 30 days then once daily. 10/05/13  Yes Lezlie Octave Black, NP  sucralfate (CARAFATE) 1 G tablet Take 1 tablet (1 g total) by mouth 4 (four) times daily -  with meals and at bedtime. 10/05/13  Yes Radene Gunning, NP    Allergies as of 11/28/2013 - Review Complete 11/28/2013  Allergen Reaction Noted  . Naproxen sodium Rash and Other (See Comments)  Family History  Problem Relation Age of Onset  . Hypertension Mother   . Lymphoma Brother   . Anesthesia problems Neg Hx   . Hypotension Neg Hx   . Malignant hyperthermia Neg Hx   . Pseudochol deficiency Neg Hx   . Colon cancer Neg Hx     History   Social History  . Marital Status: Widowed    Spouse Name: N/A    Number of Children: N/A  . Years of Education: N/A   Occupational History  . Retired    Social History Main Topics  . Smoking status:  Never Smoker   . Smokeless tobacco: Never Used  . Alcohol Use: No  . Drug Use: No  . Sexual Activity: Not Currently    Birth Control/ Protection: Surgical   Other Topics Concern  . Not on file   Social History Narrative  . No narrative on file    Review of Systems: See HPI, otherwise negative ROS  Physical Exam: BP 151/76  Pulse 81  Temp(Src) 97.6 F (36.4 C) (Oral)  Wt 132 lb 3.2 oz (59.966 kg) General:   Alert,  Well-developed, well-nourished, pleasant and cooperative in NAD Skin:  Intact without significant lesions or rashes. Eyes:  Sclera clear, no icterus.   Conjunctiva pink. Ears:  Normal auditory acuity. Nose:  No deformity, discharge,  or lesions. Mouth:  No deformity or lesions. Neck:  Supple; no masses or thyromegaly. No significant cervical adenopathy. Lungs:  Clear throughout to auscultation.   No wheezes, crackles, or rhonchi. No acute distress. Heart:  Regular rate and rhythm; no murmurs, clicks, rubs,  or gallops. Abdomen: Non-distended, normal bowel sounds.  Soft and nontender without appreciable mass or hepatosplenomegaly.  Pulses:  Normal pulses noted. Extremities:  Without clubbing or edema.  Impression:   Pleasant 80 year old lady with iron deficiency anemia. I suspect malabsorption/ dietary preferences as a cause more than anything else. Her GI evaluation as outlined above. [name] lesions can bleed intermittently but I doubt this is a significant contributing factor, if any. Prior NSAID use. She is asymptomatic from GI standpoint at this time.   Recommendations: Complete her current prescription of Carafate and then stop. Continue protonix 40 mg daily.   GERD diet/information provided.  Keep appointment with hematologist. Unless something comes up, we'll plan to see this nice lady back in one year.

## 2013-11-28 NOTE — Patient Instructions (Signed)
Stop taking Carafate after current prescription   Continue Protonix once daily  Keep appointment with hematologist  Office visit here in 1 year

## 2013-12-17 ENCOUNTER — Ambulatory Visit: Payer: Self-pay | Admitting: Oncology

## 2014-02-21 ENCOUNTER — Ambulatory Visit: Payer: Self-pay | Admitting: Oncology

## 2014-02-21 LAB — FERRITIN: Ferritin (ARMC): 38 ng/mL (ref 8–388)

## 2014-02-21 LAB — CBC CANCER CENTER
Basophil #: 0.1 x10 3/mm (ref 0.0–0.1)
Basophil %: 1 %
Eosinophil #: 0.2 x10 3/mm (ref 0.0–0.7)
Eosinophil %: 3 %
HCT: 37.2 % (ref 35.0–47.0)
HGB: 12.4 g/dL (ref 12.0–16.0)
Lymphocyte #: 2.1 x10 3/mm (ref 1.0–3.6)
Lymphocyte %: 30.3 %
MCH: 30.9 pg (ref 26.0–34.0)
MCHC: 33.3 g/dL (ref 32.0–36.0)
MCV: 93 fL (ref 80–100)
Monocyte #: 0.5 x10 3/mm (ref 0.2–0.9)
Monocyte %: 7.8 %
Neutrophil #: 4.1 x10 3/mm (ref 1.4–6.5)
Neutrophil %: 57.9 %
Platelet: 273 x10 3/mm (ref 150–440)
RBC: 4.01 10*6/uL (ref 3.80–5.20)
RDW: 15 % — ABNORMAL HIGH (ref 11.5–14.5)
WBC: 7.1 x10 3/mm (ref 3.6–11.0)

## 2014-02-21 LAB — IRON AND TIBC
Iron Bind.Cap.(Total): 384 ug/dL (ref 250–450)
Iron Saturation: 50 %
Iron: 193 ug/dL — ABNORMAL HIGH (ref 50–170)
Unbound Iron-Bind.Cap.: 191 ug/dL

## 2014-03-16 ENCOUNTER — Ambulatory Visit: Payer: Self-pay | Admitting: Oncology

## 2014-11-13 ENCOUNTER — Encounter: Payer: Self-pay | Admitting: Internal Medicine

## 2014-12-28 ENCOUNTER — Ambulatory Visit (INDEPENDENT_AMBULATORY_CARE_PROVIDER_SITE_OTHER): Payer: Medicare Other | Admitting: Gastroenterology

## 2014-12-28 ENCOUNTER — Encounter: Payer: Self-pay | Admitting: Gastroenterology

## 2014-12-28 VITALS — BP 154/67 | HR 78 | Temp 98.2°F | Ht 60.0 in | Wt 134.8 lb

## 2014-12-28 DIAGNOSIS — D509 Iron deficiency anemia, unspecified: Secondary | ICD-10-CM

## 2014-12-28 DIAGNOSIS — R1010 Upper abdominal pain, unspecified: Secondary | ICD-10-CM

## 2014-12-28 DIAGNOSIS — D649 Anemia, unspecified: Secondary | ICD-10-CM

## 2014-12-28 DIAGNOSIS — K219 Gastro-esophageal reflux disease without esophagitis: Secondary | ICD-10-CM

## 2014-12-28 DIAGNOSIS — K921 Melena: Secondary | ICD-10-CM

## 2014-12-28 DIAGNOSIS — R5383 Other fatigue: Secondary | ICD-10-CM

## 2014-12-28 DIAGNOSIS — E538 Deficiency of other specified B group vitamins: Secondary | ICD-10-CM

## 2014-12-28 LAB — CBC WITH DIFFERENTIAL/PLATELET
Basophils Absolute: 0.1 10*3/uL (ref 0.0–0.1)
Basophils Relative: 1 % (ref 0–1)
Eosinophils Absolute: 0.1 10*3/uL (ref 0.0–0.7)
Eosinophils Relative: 2 % (ref 0–5)
HCT: 25.8 % — ABNORMAL LOW (ref 36.0–46.0)
Hemoglobin: 8 g/dL — ABNORMAL LOW (ref 12.0–15.0)
Lymphocytes Relative: 28 % (ref 12–46)
Lymphs Abs: 2 10*3/uL (ref 0.7–4.0)
MCH: 26.6 pg (ref 26.0–34.0)
MCHC: 31 g/dL (ref 30.0–36.0)
MCV: 85.7 fL (ref 78.0–100.0)
MPV: 9.8 fL (ref 8.6–12.4)
Monocytes Absolute: 0.5 10*3/uL (ref 0.1–1.0)
Monocytes Relative: 7 % (ref 3–12)
Neutro Abs: 4.5 10*3/uL (ref 1.7–7.7)
Neutrophils Relative %: 62 % (ref 43–77)
Platelets: 481 10*3/uL — ABNORMAL HIGH (ref 150–400)
RBC: 3.01 MIL/uL — ABNORMAL LOW (ref 3.87–5.11)
RDW: 14.5 % (ref 11.5–15.5)
WBC: 7.2 10*3/uL (ref 4.0–10.5)

## 2014-12-28 NOTE — Assessment & Plan Note (Signed)
81 y/o female with h/o GERD, large hiatal hernia/cameron lesions in past, IDA who presents with recent episode of coffee-ground emesis, ?melena in setting of acute onset vomiting. Symptoms quickly resolved. Suspect M-W tear versus [name] lesions. Suspect feeling of food sitting in chest is related to large hiatal hernia. Patient declines consideration of any testing right now as far as invasive procedures or xrays.   She had decline in Hgb over the past one year. Suspect worsening of IDA in setting of off therapy. Previously felt to have some malabsorption +/- insufficient dietary sources. Recheck CBC, iron/tibc, ferritin. Especially in light of fatigue. We will also check her TSH. Reports borderline test in the past. Previously her B12 was borderline low, cannot get it approved to check again based on current diagnoses. Check ifobt.  If she has recurrent melena, or hematemesis, I have requested she go to the ER.   Continue PPI therapy for now and based on labs we will determine her need for oral iron.

## 2014-12-28 NOTE — Progress Notes (Signed)
Primary Care Physician:  No PCP Per Patient  Primary Gastroenterologist:  Garfield Cornea, MD   Chief Complaint  Patient presents with  . Follow-up    HPI:  Wanda [name] is a 81 y.o. female here for follow up of IDA/GERD/large hiatal hernia. She was last seen in 11/2013. Work up in past has included EGDX2/TCS/Givens capsule/CT. H/O multiple Lysbeth Galas lesions 09/2013. Hgb 12.8 one year ago. In 10/2014 Hgb was 11.3. States she has been off iron since 08/2014 because her PCP advised against iron. Was "released" from the care of the hematologist at [facility].   Has maintained Protonix 40mg  daily. Had been feeling well up until episode a few weeks ago. Developed acute onset vomiting of coffee ground emesis, two black stools. Did not seek care due to snow on roads at that time. Symptoms resolved within 48 hours and she has felt fine since. She started back on carafate she had at home.   Denies heartburn, abdominal pain, constipation, diarrhea. Sometimes feels like food sits in chest and sometimes burps it up. Otherwise no problems. Had been eating everything she wanted but recently went back to bland diet. Never a big meat eater, maybe once per week.    PCP [employer] C/o severe fatigue. No DOE/SOB/chest pain. Weight is stable. No ASA/NSAIDS.  Current Outpatient Prescriptions  Medication Sig Dispense Refill  . Cholecalciferol (VITAMIN D) 2000 UNITS CAPS Take 1 capsule by mouth 3 (three) times a week.    Marland Kitchen lisinopril-hydrochlorothiazide (PRINZIDE,ZESTORETIC) 20-12.5 MG per tablet Take 1 tablet by mouth daily.    . pantoprazole (PROTONIX) 40 MG tablet Take twice daily for 30 days then once daily. 60 tablet 1   No current facility-administered medications for this visit.    Allergies as of 12/28/2014 - Review Complete 12/28/2014  Allergen Reaction Noted  . Naproxen sodium Rash and Other (See Comments)     Past Medical History  Diagnosis Date  . IDA (iron deficiency  anemia)   . GERD (gastroesophageal reflux disease)   . Gastritis 12/08/10    EGD by Dr. Altha Harm hiatal hernia, duodenal diverticulum, chronic gastritis  . Diverticulosis 10/20/10    Left-sided on colonoscopy by Dr. Gala Romney 10/20/10  . HTN (hypertension)   . Vitamin D deficiency   . Osteoarthritis   . Sleep apnea     STOP BANG SCORE 4  . Cystocele   . Urge incontinence   . Radial fracture     (right) Undergoing treatment by Dr. Aline Brochure currently  . 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 Hiatal hernia 10/02/2013  . Gastric polyps 10/02/2013  . Heart murmur     Past Surgical History  Procedure Laterality Date  . Colonoscopy  02/29/2012    Dr. Gala Romney: colonic diverticulosis, minimal internal hemorrhoids, benign polyp  . Esophagogastroduodenoscopy  02/29/2012    Dr. Gala Romney: atonic esophagus, moderate-sized hiatal hernia, fundal gland polyps  . Givens capsule study  02/29/2012    No explanation for IDA. Possible extrinsic compression but negative CT.   . Vaginal hysterectomy  05/17/2012    Procedure: HYSTERECTOMY VAGINAL;  Surgeon: Jonnie Kind, MD;  Location: [facility];  Service: Gynecology;  Laterality: N/A;  . Anterior and posterior repair  05/17/2012    Procedure: ANTERIOR (CYSTOCELE) AND POSTERIOR REPAIR (RECTOCELE);  Surgeon: Jonnie Kind, MD;  Location: [facility];  Service: Gynecology;  Laterality: N/A;  . Salpingoophorectomy  05/17/2012    Procedure: SALPINGO OOPHERECTOMY;  Surgeon: Jonnie Kind, MD;  Location: [facility];  Service: Gynecology;  Laterality: Bilateral;  . Esophagogastroduodenoscopy N/A 10/02/2013    Dr. Gala Romney- multiple Wilhemena Durie gastric polyps- not manipulated    Family History  Problem Relation Age of Onset  . Hypertension Mother   . Lymphoma Brother   . Anesthesia problems Neg Hx   . Hypotension Neg Hx   . Malignant hyperthermia Neg Hx   . Pseudochol deficiency Neg Hx   . Colon cancer Neg Hx     History   Social History  . Marital Status:  Widowed    Spouse Name: N/A  . Number of Children: N/A  . Years of Education: N/A   Occupational History  . Retired    Social History Main Topics  . Smoking status: Never Smoker   . Smokeless tobacco: Never Used  . Alcohol Use: No  . Drug Use: No  . Sexual Activity: Not Currently    Birth Control/ Protection: Surgical   Other Topics Concern  . Not on file   Social History Narrative      ROS:  General: Negative for anorexia, weight loss, fever, chills, fatigue, weakness. Eyes: Negative for vision changes.  ENT: Negative for hoarseness, difficulty swallowing , nasal congestion. CV: Negative for chest pain, angina, palpitations, dyspnea on exertion, peripheral edema.  Respiratory: Negative for dyspnea at rest, dyspnea on exertion, cough, sputum, wheezing.  GI: See history of present illness. GU:  Negative for dysuria, hematuria, urinary incontinence, urinary frequency, nocturnal urination.  MS: Negative for joint pain, low back pain.  Derm: Negative for rash or itching.  Neuro: Negative for weakness, abnormal sensation, seizure, frequent headaches, memory loss, confusion.  Psych: Negative for anxiety, depression, suicidal ideation, hallucinations.  Endo: Negative for unusual weight change.  Heme: Negative for bruising or bleeding. Allergy: Negative for rash or hives.    Physical Examination:  BP 154/67 mmHg  Pulse 78  Temp(Src) 98.2 F (36.8 C) (Oral)  Ht 5' (1.524 m)  Wt 134 lb 12.8 oz (61.145 kg)  BMI 26.33 kg/m2   General: Well-nourished, well-developed in no acute distress.  Head: Normocephalic, atraumatic.   Eyes: Conjunctiva pink, no icterus. Mouth: Oropharyngeal mucosa moist and pink , no lesions erythema or exudate. Neck: Supple without thyromegaly, masses, or lymphadenopathy.  Lungs: Clear to auscultation bilaterally.  Heart: Regular rate and rhythm, no murmurs rubs or gallops.  Abdomen: Bowel sounds are normal, nontender, nondistended, no  hepatosplenomegaly or masses, no abdominal bruits or    hernia , no rebound or guarding.   Rectal: not performed Extremities: No lower extremity edema. No clubbing or deformities.  Neuro: Alert and oriented x 4 , grossly normal neurologically.  Skin: Warm and dry, no rash or jaundice.   Psych: Alert and cooperative, normal mood and affect.  Labs: Labs from outside source dated 11/11/2014. Hemoglobin 11.3, hematocrit 35.4, MCV 96, platelets 3 43,000, white blood cell count 9100, creatinine 0.7  Lab Results  Component Value Date   VITAMINB12 286 01/19/2012     Imaging Studies: No results found.

## 2014-12-28 NOTE — Patient Instructions (Signed)
1. Please have your labs done. 2. Return stool specimen to our office. 3. We will call you with further recommendations once results reviewed. 4. Continue pantoprazole daily. 5. If you have further black stools or vomiting old or fresh blood, you should go to the ER.

## 2014-12-29 LAB — HEPATIC FUNCTION PANEL
ALT: 14 U/L (ref 0–35)
AST: 19 U/L (ref 0–37)
Albumin: 3.9 g/dL (ref 3.5–5.2)
Alkaline Phosphatase: 95 U/L (ref 39–117)
Bilirubin, Direct: 0.1 mg/dL (ref 0.0–0.3)
Indirect Bilirubin: 0.6 mg/dL (ref 0.2–1.2)
Total Bilirubin: 0.7 mg/dL (ref 0.2–1.2)
Total Protein: 7.1 g/dL (ref 6.0–8.3)

## 2014-12-29 LAB — IRON AND TIBC
%SAT: 4 % — ABNORMAL LOW (ref 20–55)
Iron: 18 ug/dL — ABNORMAL LOW (ref 42–145)
TIBC: 463 ug/dL (ref 250–470)
UIBC: 445 ug/dL — ABNORMAL HIGH (ref 125–400)

## 2014-12-29 LAB — FERRITIN: Ferritin: 9 ng/mL — ABNORMAL LOW (ref 10–291)

## 2014-12-29 LAB — TSH: TSH: 2.608 u[IU]/mL (ref 0.350–4.500)

## 2015-01-01 NOTE — Progress Notes (Signed)
Quick Note:  Evidence of iron deficiency again. Her Hgb is also down to 8. Reports recent melena, coffee ground emesis.  Start on Fusion Plus once daily RX #30 3 refills. Please see if she is feeling ok, ?fatigue/SOB/any further melena. I will be addressing with Dr. Gala Romney, likely will recommend EGD at this point. ______

## 2015-01-02 ENCOUNTER — Telehealth: Payer: Self-pay | Admitting: Internal Medicine

## 2015-01-02 ENCOUNTER — Other Ambulatory Visit: Payer: Self-pay | Admitting: Gastroenterology

## 2015-01-02 MED ORDER — FUSION PLUS PO CAPS: 1.0000 | ORAL_CAPSULE | Freq: Every day | ORAL | Status: DC

## 2015-01-02 NOTE — Telephone Encounter (Signed)
PATIENT RETURNED CALL, WILL TRY BACK THIS AFTERNOON

## 2015-01-02 NOTE — Telephone Encounter (Signed)
I spoke with the pt about her blood work.

## 2015-01-03 ENCOUNTER — Ambulatory Visit (INDEPENDENT_AMBULATORY_CARE_PROVIDER_SITE_OTHER): Payer: Medicare Other

## 2015-01-03 DIAGNOSIS — D62 Acute posthemorrhagic anemia: Secondary | ICD-10-CM | POA: Diagnosis not present

## 2015-01-03 LAB — IFOBT (OCCULT BLOOD): IFOBT: NEGATIVE

## 2015-01-03 NOTE — Progress Notes (Signed)
Negative.

## 2015-01-04 NOTE — Progress Notes (Signed)
Quick Note:  Please let patient know her stool test was negative for blood. I spoke with RMR. Given recent melena, and significant change in her H/H, he recommends EGD.   Please schedule EGD with RMR (melena, anemia) if patient agreeable. Patient was seen in office 12/28/14. ______

## 2015-01-07 ENCOUNTER — Other Ambulatory Visit: Payer: Self-pay

## 2015-01-07 DIAGNOSIS — R1010 Upper abdominal pain, unspecified: Secondary | ICD-10-CM

## 2015-01-07 DIAGNOSIS — K921 Melena: Secondary | ICD-10-CM

## 2015-01-07 DIAGNOSIS — D509 Iron deficiency anemia, unspecified: Secondary | ICD-10-CM

## 2015-01-07 NOTE — Progress Notes (Signed)
No pcp per patient 

## 2015-01-08 ENCOUNTER — Other Ambulatory Visit: Payer: Self-pay

## 2015-01-13 NOTE — Progress Notes (Signed)
Quick Note:  Let's have patient recheck her Hgb within the next week or so to make sure stable to improved.  She has endo 01/14/15, ?have done then? ______

## 2015-01-14 ENCOUNTER — Encounter (HOSPITAL_COMMUNITY): Payer: Self-pay | Admitting: *Deleted

## 2015-01-14 ENCOUNTER — Encounter (HOSPITAL_COMMUNITY)
Admission: RE | Disposition: A | Discharge status: Home / Self Care | Payer: Self-pay | Source: Ambulatory Visit | Attending: Internal Medicine

## 2015-01-14 ENCOUNTER — Ambulatory Visit (HOSPITAL_COMMUNITY)
Admission: RE | Admit: 2015-01-14 | Discharge: 2015-01-14 | Disposition: A | Discharge status: Home / Self Care | Payer: Medicare Other | Source: Ambulatory Visit | Attending: Internal Medicine | Admitting: Internal Medicine

## 2015-01-14 DIAGNOSIS — K449 Diaphragmatic hernia without obstruction or gangrene: Secondary | ICD-10-CM | POA: Diagnosis not present

## 2015-01-14 DIAGNOSIS — R1010 Upper abdominal pain, unspecified: Secondary | ICD-10-CM

## 2015-01-14 DIAGNOSIS — D509 Iron deficiency anemia, unspecified: Secondary | ICD-10-CM

## 2015-01-14 DIAGNOSIS — K921 Melena: Secondary | ICD-10-CM

## 2015-01-14 HISTORY — PX: ESOPHAGOGASTRODUODENOSCOPY: SHX5428

## 2015-01-14 LAB — CBC WITH DIFFERENTIAL/PLATELET
Basophils Absolute: 0.1 10*3/uL (ref 0.0–0.1)
Basophils Relative: 1 % (ref 0–1)
Eosinophils Absolute: 0.2 10*3/uL (ref 0.0–0.7)
Eosinophils Relative: 2 % (ref 0–5)
HCT: 32 % — ABNORMAL LOW (ref 36.0–46.0)
Hemoglobin: 9.9 g/dL — ABNORMAL LOW (ref 12.0–15.0)
Lymphocytes Relative: 28 % (ref 12–46)
Lymphs Abs: 2.2 10*3/uL (ref 0.7–4.0)
MCH: 27.2 pg (ref 26.0–34.0)
MCHC: 30.9 g/dL (ref 30.0–36.0)
MCV: 87.9 fL (ref 78.0–100.0)
Monocytes Absolute: 0.5 10*3/uL (ref 0.1–1.0)
Monocytes Relative: 7 % (ref 3–12)
Neutro Abs: 4.9 10*3/uL (ref 1.7–7.7)
Neutrophils Relative %: 62 % (ref 43–77)
Platelets: 443 10*3/uL — ABNORMAL HIGH (ref 150–400)
RBC: 3.64 MIL/uL — ABNORMAL LOW (ref 3.87–5.11)
RDW: 17.7 % — ABNORMAL HIGH (ref 11.5–15.5)
WBC: 7.9 10*3/uL (ref 4.0–10.5)

## 2015-01-14 SURGERY — EGD (ESOPHAGOGASTRODUODENOSCOPY)
Anesthesia: Moderate Sedation

## 2015-01-14 MED ORDER — MEPERIDINE HCL 100 MG/ML IJ SOLN
INTRAMUSCULAR | Status: AC
  Filled 2015-01-14: qty 2

## 2015-01-14 MED ORDER — LIDOCAINE VISCOUS 2 % MT SOLN
OROMUCOSAL | Status: AC
  Filled 2015-01-14: qty 15

## 2015-01-14 MED ORDER — ONDANSETRON HCL 4 MG/2ML IJ SOLN
INTRAMUSCULAR | Status: AC
  Filled 2015-01-14: qty 2

## 2015-01-14 MED ORDER — SODIUM CHLORIDE 0.9 % IV SOLN
INTRAVENOUS | Status: DC
  Administered 2015-01-14: 1000 mL via INTRAVENOUS

## 2015-01-14 MED ORDER — LIDOCAINE VISCOUS 2 % MT SOLN
OROMUCOSAL | Status: DC | PRN
  Administered 2015-01-14: 3 mL via OROMUCOSAL

## 2015-01-14 MED ORDER — STERILE WATER FOR IRRIGATION IR SOLN
Status: DC | PRN
  Administered 2015-01-14: 14:00:00

## 2015-01-14 MED ORDER — MIDAZOLAM HCL 5 MG/5ML IJ SOLN
INTRAMUSCULAR | Status: DC | PRN
  Administered 2015-01-14: 1 mg via INTRAVENOUS
  Administered 2015-01-14: 2 mg via INTRAVENOUS

## 2015-01-14 MED ORDER — MIDAZOLAM HCL 5 MG/5ML IJ SOLN
INTRAMUSCULAR | Status: AC
  Filled 2015-01-14: qty 10

## 2015-01-14 MED ORDER — ONDANSETRON HCL 4 MG/2ML IJ SOLN
INTRAMUSCULAR | Status: DC | PRN
  Administered 2015-01-14: 4 mg via INTRAVENOUS

## 2015-01-14 MED ORDER — MEPERIDINE HCL 100 MG/ML IJ SOLN
INTRAMUSCULAR | Status: DC | PRN
  Administered 2015-01-14: 25 mg via INTRAVENOUS
  Administered 2015-01-14: 50 mg via INTRAVENOUS

## 2015-01-14 NOTE — Op Note (Signed)
[facility] [address] [place], [address]   ENDOSCOPY PROCEDURE REPORT  PATIENT: Edmund, Rick  MR#: [id number] BIRTHDATE: Jul 26, 1933 , 23  yrs. old GENDER: female ENDOSCOPIST: R.  Garfield Cornea, MD FACP [facility] REFERRED BY:     none reported PROCEDURE DATE:  01-26-2015 PROCEDURE:  EGD, diagnostic INDICATIONS:  Reported melena; hemoglobin now 9.9 after starting iron supplementation orally earlier this month.  Hemoccult negative stool. MEDICATIONS: Versed 3 mg IV and Demerol 50 mg IV in divided doses. Zofran 4 mg IV.  Xylocaine gel orally. ASA CLASS:      Class II  CONSENT: The risks, benefits, limitations, alternatives and imponderables have been discussed.  The potential for biopsy, esophogeal dilation, etc. have also been reviewed.  Questions have been answered.  All parties agreeable.  Please see the history and physical in the medical record for more information.  DESCRIPTION OF PROCEDURE: After the risks benefits and alternatives of the procedure were thoroughly explained, informed consent was obtained.  The [id number] ([id number]) endoscope was introduced through the mouth and advanced to the second portion of the duodenum , limited by Without limitations. The instrument was slowly withdrawn as the mucosa was fully examined.    Normal-appearing esophageal mucosa.  EG junction at 32 cm from the incisors.  Large hiatal hernia with diaphragmatic hiatus located at 37 cm from the incisors.  The esophageal mucosa appeared normal. The gastric mucosa appeared normal.  The patent pylorus.  Normal first and second portion of the duodenum.  Retroflexed views revealed a hiatal hernia.     The scope was then withdrawn from the patient and the procedure completed.  COMPLICATIONS: There were no immediate complications.  ENDOSCOPIC IMPRESSION: Large hiatal hernia; otherwise normal EGD  RECOMMENDATIONS: Continue Protonix 40 mg once daily. Continue iron  supplementation. May use Pepcid Complete one to 2 tablets when necessary occasional breakthrough GERD symptoms.  REPEAT EXAM:  eSigned:  R. Garfield Cornea, MD Rosalita Chessman [facility] 26-Jan-2015 2:16 PM    CC:  CPT CODES: ICD CODES:  The ICD and CPT codes recommended by this software are interpretations from the data that the clinical staff has captured with the software.  The verification of the translation of this report to the ICD and CPT codes and modifiers is the sole responsibility of the health care institution and practicing physician where this report was generated.  [employer] will not be held responsible for the validity of the ICD and CPT codes included on this report.  AMA assumes no liability for data contained or not contained herein. CPT is a Designer, television/film set of the [employer].

## 2015-01-14 NOTE — H&P (View-Only) (Signed)
Quick Note:  Please let patient know her stool test was negative for blood. I spoke with RMR. Given recent melena, and significant change in her H/H, he recommends EGD.   Please schedule EGD with RMR (melena, anemia) if patient agreeable. Patient was seen in office 12/28/14. ______

## 2015-01-14 NOTE — Discharge Instructions (Addendum)
EGD Discharge instructions Please read the instructions outlined below and refer to this sheet in the next few weeks. These discharge instructions provide you with general information on caring for yourself after you leave the hospital. Your doctor may also give you specific instructions. While your treatment has been planned according to the most current medical practices available, unavoidable complications occasionally occur. If you have any problems or questions after discharge, please call your doctor. ACTIVITY  You may resume your regular activity but move at a slower pace for the next 24 hours.   Take frequent rest periods for the next 24 hours.   Walking will help expel (get rid of) the air and reduce the bloated feeling in your abdomen.   No driving for 24 hours (because of the anesthesia (medicine) used during the test).   You may shower.   Do not sign any important legal documents or operate any machinery for 24 hours (because of the anesthesia used during the test).  NUTRITION  Drink plenty of fluids.   You may resume your normal diet.   Begin with a light meal and progress to your normal diet.   Avoid alcoholic beverages for 24 hours or as instructed by your caregiver.  MEDICATIONS  You may resume your normal medications unless your caregiver tells you otherwise.  WHAT YOU CAN EXPECT TODAY  You may experience abdominal discomfort such as a feeling of fullness or gas pains.  FOLLOW-UP  Your doctor will discuss the results of your test with you.  SEEK IMMEDIATE MEDICAL ATTENTION IF ANY OF THE FOLLOWING OCCUR:  Excessive nausea (feeling sick to your stomach) and/or vomiting.   Severe abdominal pain and distention (swelling).   Trouble swallowing.   Temperature over 101 F (37.8 C).   Rectal bleeding or vomiting of blood  Gastroesophageal Reflux Disease, Adult Gastroesophageal reflux disease (GERD) happens when acid from your stomach flows up into the  esophagus. When acid comes in contact with the esophagus, the acid causes soreness (inflammation) in the esophagus. Over time, GERD may create small holes (ulcers) in the lining of the esophagus. CAUSES   Increased body weight. This puts pressure on the stomach, making acid rise from the stomach into the esophagus.  Smoking. This increases acid production in the stomach.  Drinking alcohol. This causes decreased pressure in the lower esophageal sphincter (valve or ring of muscle between the esophagus and stomach), allowing acid from the stomach into the esophagus.  Late evening meals and a full stomach. This increases pressure and acid production in the stomach.  A malformed lower esophageal sphincter. Sometimes, no cause is found. SYMPTOMS   Burning pain in the lower part of the mid-chest behind the breastbone and in the mid-stomach area. This may occur twice a week or more often.  Trouble swallowing.  Sore throat.  Dry cough.  Asthma-like symptoms including chest tightness, shortness of breath, or wheezing. DIAGNOSIS  Your caregiver may be able to diagnose GERD based on your symptoms. In some cases, X-rays and other tests may be done to check for complications or to check the condition of your stomach and esophagus. TREATMENT  Your caregiver may recommend over-the-counter or prescription medicines to help decrease acid production. Ask your caregiver before starting or adding any new medicines.  HOME CARE INSTRUCTIONS   Change the factors that you can control. Ask your caregiver for guidance concerning weight loss, quitting smoking, and alcohol consumption.  Avoid foods and drinks that make your symptoms worse, such as:  Caffeine or alcoholic drinks.  Chocolate.  Peppermint or mint flavorings.  Garlic and onions.  Spicy foods.  Citrus fruits, such as oranges, lemons, or limes.  Tomato-based foods such as sauce, chili, salsa, and pizza.  Fried and fatty foods.  Avoid  lying down for the 3 hours prior to your bedtime or prior to taking a nap.  Eat small, frequent meals instead of large meals.  Wear loose-fitting clothing. Do not wear anything tight around your waist that causes pressure on your stomach.  Raise the head of your bed 6 to 8 inches with wood blocks to help you sleep. Extra pillows will not help.  Only take over-the-counter or prescription medicines for pain, discomfort, or fever as directed by your caregiver.  Do not take aspirin, ibuprofen, or other nonsteroidal anti-inflammatory drugs (NSAIDs). SEEK IMMEDIATE MEDICAL CARE IF:   You have pain in your arms, neck, jaw, teeth, or back.  Your pain increases or changes in intensity or duration.  You develop nausea, vomiting, or sweating (diaphoresis).  You develop shortness of breath, or you faint.  Your vomit is green, yellow, black, or looks like coffee grounds or blood.  Your stool is red, bloody, or black. These symptoms could be signs of other problems, such as heart disease, gastric bleeding, or esophageal bleeding. MAKE SURE YOU:   Understand these instructions.  Will watch your condition.  Will get help right away if you are not doing well or get worse. Document Released: 08/12/2005 Document Revised: 01/25/2012 Document Reviewed: 05/22/2011 [facility] Patient Information 2015 [place], [place]. This information is not intended to replace advice given to you by your health care provider. Make sure you discuss any questions you have with your health care provider.    Hiatal Hernia A hiatal hernia occurs when part of your stomach slides above the muscle that separates your abdomen from your chest (diaphragm). You can be born with a hiatal hernia (congenital), or it may develop over time. In almost all cases of hiatal hernia, only the top part of the stomach pushes through.  Many people have a hiatal hernia with no symptoms. The larger the hernia, the more likely that you will have  symptoms. In some cases, a hiatal hernia allows stomach acid to flow back into the tube that carries food from your mouth to your stomach (esophagus). This may cause heartburn symptoms. Severe heartburn symptoms may mean you have developed a condition called gastroesophageal reflux disease (GERD).  CAUSES  Hiatal hernias are caused by a weakness in the opening (hiatus) where your esophagus passes through your diaphragm to attach to the upper part of your stomach. You may be born with a weakness in your hiatus, or a weakness can develop. RISK FACTORS Older age is a major risk factor for a hiatal hernia. Anything that increases pressure on your diaphragm can also increase your risk of a hiatal hernia. This includes:  Pregnancy.  Excess weight.  Frequent constipation. SIGNS AND SYMPTOMS  People with a hiatal hernia often have no symptoms. If symptoms develop, they are almost always caused by GERD. They may include:  Heartburn.  Belching.  Indigestion.  Trouble swallowing.  Coughing or wheezing.  Sore throat.  Hoarseness.  Chest pain. DIAGNOSIS  A hiatal hernia is sometimes found during an exam for another problem. Your health care provider may suspect a hiatal hernia if you have symptoms of GERD. Tests may be done to diagnose GERD. These may include:  X-rays of your stomach or chest.  An  upper gastrointestinal (GI) series. This is an X-ray exam of your GI tract involving the use of a chalky liquid that you swallow. The liquid shows up clearly on the X-ray.  Endoscopy. This is a procedure to look into your stomach using a thin, flexible tube that has a tiny camera and light on the end of it. TREATMENT  If you have no symptoms, you may not need treatment. If you have symptoms, treatment may include:  Dietary and lifestyle changes to help reduce GERD symptoms.  Medicines. These may include:  Over-the-counter antacids.  Medicines that make your stomach empty more  quickly.  Medicines that block the production of stomach acid (H2 blockers).  Stronger medicines to reduce stomach acid (proton pump inhibitors).  You may need surgery to repair the hernia if other treatments are not helping. HOME CARE INSTRUCTIONS   Take all medicines as directed by your health care provider.  Quit smoking, if you smoke.  Try to achieve and maintain a healthy body weight.  Eat frequent small meals instead of three large meals a day. This keeps your stomach from getting too full.  Eat slowly.  Do not lie down right after eating.  Do noteat 1-2 hours before bed.   Do not drink beverages with caffeine. These include cola, coffee, cocoa, and tea.  Do not drink alcohol.  Avoid foods that can make symptoms of GERD worse. These may include:  Fatty foods.  Citrus fruits.  Other foods and drinks that contain acid.  Avoid putting pressure on your belly. Anything that puts pressure on your belly increases the amount of acid that may be pushed up into your esophagus.   Avoid bending over, especially after eating.  Raise the head of your bed by putting blocks under the legs. This keeps your head and esophagus higher than your stomach.  Do not wear tight clothing around your chest or stomach.  Try not to strain when having a bowel movement, when urinating, or when lifting heavy objects. SEEK MEDICAL CARE IF:  Your symptoms are not controlled with medicines or lifestyle changes.  You are having trouble swallowing.  You have coughing or wheezing that will not go away. SEEK IMMEDIATE MEDICAL CARE IF:  Your pain is getting worse.  Your pain spreads to your arms, neck, jaw, teeth, or back.  You have shortness of breath.  You sweat for no reason.  You feel sick to your stomach (nauseous) or vomit.  You vomit blood.  You have bright red blood in your stools.  You have black, tarry stools.  Document Released: 01/23/2004 Document Revised: 03/19/2014  Document Reviewed: 10/20/2013 [facility] Patient Information 2015 [place], [place]. This information is not intended to replace advice given to you by your health care provider. Make sure you discuss any questions you have with your health care provider.  GERD and hiatal hernia information provided  Continue Protonix 40 mg daily  Continue iron supplementation daily  May use 1-2 Pepcid Complete tablets on an as needed if you have breakthrough reflux symptoms.  H&H and office visit with [place] in 8 weeks. 03/15/15 at 8am  .

## 2015-01-14 NOTE — Interval H&P Note (Signed)
History and Physical Interval Note:  01/14/2015 1:48 PM  Wanda [name]  has presented today for surgery, with the diagnosis of melena, IDA, upper abd pain  The various methods of treatment have been discussed with the patient and family. After consideration of risks, benefits and other options for treatment, the patient has consented to  Procedure(s) with comments: ESOPHAGOGASTRODUODENOSCOPY (EGD) (N/A) - 115pm - moved to 1:30 - Ginger to notify pt as a surgical intervention .  The patient's history has been reviewed, patient examined, no change in status, stable for surgery.  I have reviewed the patient's chart and labs.  Questions were answered to the patient's satisfaction.     Stool Hemoccult negative. Hemoglobin artery up to 9.9 with iron supplementation. No dysphagia. EGD today per plan.The risks, benefits, limitations, alternatives and imponderables have been reviewed with the patient. Potential for esophageal dilation, biopsy, etc. have also been reviewed.  Questions have been answered. All parties agreeable.  Manus Rudd

## 2015-01-16 ENCOUNTER — Encounter (HOSPITAL_COMMUNITY): Payer: Self-pay | Admitting: Internal Medicine

## 2015-03-15 ENCOUNTER — Other Ambulatory Visit: Payer: Self-pay

## 2015-03-15 ENCOUNTER — Encounter: Payer: Self-pay | Admitting: Gastroenterology

## 2015-03-15 ENCOUNTER — Ambulatory Visit (INDEPENDENT_AMBULATORY_CARE_PROVIDER_SITE_OTHER): Payer: Medicare Other | Admitting: Gastroenterology

## 2015-03-15 VITALS — BP 170/90 | HR 58 | Temp 97.3°F | Ht 62.0 in | Wt 135.6 lb

## 2015-03-15 DIAGNOSIS — K591 Functional diarrhea: Secondary | ICD-10-CM | POA: Diagnosis not present

## 2015-03-15 DIAGNOSIS — D509 Iron deficiency anemia, unspecified: Secondary | ICD-10-CM

## 2015-03-15 DIAGNOSIS — K449 Diaphragmatic hernia without obstruction or gangrene: Secondary | ICD-10-CM

## 2015-03-15 DIAGNOSIS — K219 Gastro-esophageal reflux disease without esophagitis: Secondary | ICD-10-CM | POA: Diagnosis not present

## 2015-03-15 MED ORDER — DICYCLOMINE HCL 10 MG PO CAPS: ORAL_CAPSULE | ORAL | Status: DC

## 2015-03-15 NOTE — Assessment & Plan Note (Signed)
IDA, symptomatically improved. We'll recheck labs next month including CBC, iron/TIBC, ferritin. Continue fusion plus for now.

## 2015-03-15 NOTE — Assessment & Plan Note (Signed)
Symptoms controlled on pantoprazole 40 mg daily.

## 2015-03-15 NOTE — Progress Notes (Signed)
Primary Care Physician: No PCP Per Patient  Primary Gastroenterologist:  Garfield Cornea, MD   Chief Complaint  Patient presents with  . Follow-up    HPI: Wanda [name] is a 81 y.o. female here for follow-up of IDA/GERD/large hiatal hernia. EGD in February after episode of melena revealed large hiatal hernia but otherwise unremarkable study. Last hemoglobin up from 8-9.9 with a buried 29th.  Chronic postprandial fecal urgency within an hour after meals. Has been occurring for years but never really mentioned it. Problems about every couple of weeks. Usually has at least 3 stools daily but most of the time without any significant urgency. Symptoms are mild to moderate in severity. However worries about going out to eat because of fear of not making it to the bathroom in time. Avoids trips with friends because of fecal urgency. No melena, brbpr. Has not tried any sort of over-the-counter regimen. No associated abdominal pain.  Iron deficiency anemia, hemoglobin improved when last checked in February. Taking Fusion Plus, stools dark. Increased energy. No Advil/Aleve.  GERD well controlled on pantoprazole. She would like to continue to receive prescription refills for GI issues through [place]. Previously received from PCP. No dysphagia. Appetite is good. No unintentional weight loss.  Large hiatal hernia without any significant issues at this time.  Current Outpatient Prescriptions  Medication Sig Dispense Refill  . Cholecalciferol (VITAMIN D) 2000 UNITS CAPS Take 1 capsule by mouth 3 (three) times a week.    . Iron-FA-B Cmp-C-Biot-Probiotic (FUSION PLUS) CAPS Take 1 capsule by mouth daily. 30 capsule 3  . lisinopril-hydrochlorothiazide (PRINZIDE,ZESTORETIC) 20-12.5 MG per tablet Take 1 tablet by mouth daily.    . pantoprazole (PROTONIX) 40 MG tablet Take twice daily for 30 days then once daily. 60 tablet 1  . predniSONE (DELTASONE) 10 MG tablet Take 10 mg by mouth daily with breakfast.  Almost done with dosage.  Has 3 pills left to take     No current facility-administered medications for this visit.    Allergies as of 03/15/2015 - Review Complete 03/15/2015  Allergen Reaction Noted  . Naproxen sodium Rash and Other (See Comments)     ROS:  General: Negative for anorexia, weight loss, fever, chills, fatigue, weakness. ENT: Negative for hoarseness, difficulty swallowing , nasal congestion. CV: Negative for chest pain, angina, palpitations, dyspnea on exertion, peripheral edema.  Respiratory: Negative for dyspnea at rest, dyspnea on exertion, cough, sputum, wheezing.  GI: See history of present illness. GU:  Negative for dysuria, hematuria, urinary incontinence, urinary frequency, nocturnal urination.  Endo: Negative for unusual weight change.    Physical Examination:   BP 172/82 mmHg  Pulse 58  Temp(Src) 97.3 F (36.3 C)  Ht 5\' 2"  (1.575 m)  Wt 135 lb 9.6 oz (61.508 kg)  BMI 24.80 kg/m2  General: Well-nourished, well-developed in no acute distress.  Eyes: No icterus. Mouth: Oropharyngeal mucosa moist and pink , no lesions erythema or exudate. Lungs: Clear to auscultation bilaterally.  Heart: Regular rate and rhythm, no murmurs rubs or gallops.  Abdomen: Bowel sounds are normal, nontender, nondistended, no hepatosplenomegaly or masses, no abdominal bruits or hernia , no rebound or guarding.   Extremities: No lower extremity edema. No clubbing or deformities. Neuro: Alert and oriented x 4   Skin: Warm and dry, no jaundice.   Psych: Alert and cooperative, normal mood and affect.  Labs:  Lab Results  Component Value Date   WBC 7.9 01/14/2015   HGB 9.9* 01/14/2015  HCT 32.0* 01/14/2015   MCV 87.9 01/14/2015   PLT 443* 01/14/2015   Lab Results  Component Value Date   CREATININE 0.73 10/04/2013   BUN 12 10/04/2013   NA 145 10/04/2013   K 4.1 10/04/2013   CL 114* 10/04/2013   CO2 24 10/04/2013   Lab Results  Component Value Date   ALT 14  12/28/2014   AST 19 12/28/2014   ALKPHOS 95 12/28/2014   BILITOT 0.7 12/28/2014   Lab Results  Component Value Date   IRON 18* 12/28/2014   TIBC 463 12/28/2014   FERRITIN 9* 12/28/2014    Imaging Studies: No results found.

## 2015-03-15 NOTE — Assessment & Plan Note (Signed)
Large hiatal hernia noted again on EGD recently, however no [name] lesions this time. Clinically doing well.

## 2015-03-15 NOTE — Assessment & Plan Note (Signed)
Complains a several year history of postprandial fecal urgency generally well controlled except for about twice per month. Fears eating out with friends are going on trips with friends because of issues. Seems to happen if she gets stressed or nervous. Suspect functional component. Last colonoscopy 2013 without any significant issues.  Trial of dicyclomine 10-20 mg before meals or situations which tend to increase fecal urgency, no more than 4 times daily. Advised to limit as much as possible given her age and potential side effects from medication. Voiced understanding. She'll call with any further problems. Otherwise see her back in 6 months.

## 2015-03-15 NOTE — Progress Notes (Signed)
No pcp per patient 

## 2015-03-15 NOTE — Patient Instructions (Signed)
1. Please have your labs done in 4 weeks.  2. Try Bentyl 1-2 capsules one hour before meals or activities that usually cause loose stools. Take no more than four times per day. Hold for constipation. 3. Return to the office in six months.

## 2015-04-16 LAB — CBC WITH DIFFERENTIAL/PLATELET
Basophils Absolute: 0.1 10*3/uL (ref 0.0–0.1)
Basophils Relative: 1 % (ref 0–1)
Eosinophils Absolute: 0.2 10*3/uL (ref 0.0–0.7)
Eosinophils Relative: 2 % (ref 0–5)
HCT: 36.7 % (ref 36.0–46.0)
Hemoglobin: 12.3 g/dL (ref 12.0–15.0)
Lymphocytes Relative: 25 % (ref 12–46)
Lymphs Abs: 1.9 10*3/uL (ref 0.7–4.0)
MCH: 30.4 pg (ref 26.0–34.0)
MCHC: 33.5 g/dL (ref 30.0–36.0)
MCV: 90.6 fL (ref 78.0–100.0)
MPV: 10 fL (ref 8.6–12.4)
Monocytes Absolute: 0.5 10*3/uL (ref 0.1–1.0)
Monocytes Relative: 7 % (ref 3–12)
Neutro Abs: 4.9 10*3/uL (ref 1.7–7.7)
Neutrophils Relative %: 65 % (ref 43–77)
Platelets: 337 10*3/uL (ref 150–400)
RBC: 4.05 MIL/uL (ref 3.87–5.11)
RDW: 14.6 % (ref 11.5–15.5)
WBC: 7.6 10*3/uL (ref 4.0–10.5)

## 2015-04-17 LAB — FERRITIN: Ferritin: 49 ng/mL (ref 10–291)

## 2015-04-17 LAB — IRON AND TIBC
%SAT: 39 % (ref 20–55)
Iron: 135 ug/dL (ref 42–145)
TIBC: 342 ug/dL (ref 250–470)
UIBC: 207 ug/dL (ref 125–400)

## 2015-04-21 NOTE — Progress Notes (Signed)
Quick Note:  Please let patient know her hemoglobin and iron are now normal. Continue iron for now. Repeat CBC, iron/ferritin in 4 months. ______

## 2015-04-22 ENCOUNTER — Telehealth: Payer: Self-pay

## 2015-04-22 ENCOUNTER — Other Ambulatory Visit: Payer: Self-pay

## 2015-04-22 DIAGNOSIS — D509 Iron deficiency anemia, unspecified: Secondary | ICD-10-CM

## 2015-04-22 NOTE — Progress Notes (Signed)
Quick Note:  LMOM to call. ______ 

## 2015-04-22 NOTE — Telephone Encounter (Signed)
Pt called and was informed of results ( See 04/16/2015 note) and lab orders on file for 08/22/2015.

## 2015-04-30 ENCOUNTER — Other Ambulatory Visit: Payer: Self-pay | Admitting: Gastroenterology

## 2015-07-26 ENCOUNTER — Other Ambulatory Visit: Payer: Self-pay

## 2015-07-26 DIAGNOSIS — D509 Iron deficiency anemia, unspecified: Secondary | ICD-10-CM

## 2015-08-16 LAB — CBC WITH DIFFERENTIAL/PLATELET
Basophils Absolute: 0 10*3/uL (ref 0.0–0.1)
Basophils Relative: 0 % (ref 0–1)
Eosinophils Absolute: 0.2 10*3/uL (ref 0.0–0.7)
Eosinophils Relative: 2 % (ref 0–5)
HCT: 36.7 % (ref 36.0–46.0)
Hemoglobin: 12.4 g/dL (ref 12.0–15.0)
Lymphocytes Relative: 26 % (ref 12–46)
Lymphs Abs: 2.2 10*3/uL (ref 0.7–4.0)
MCH: 31.6 pg (ref 26.0–34.0)
MCHC: 33.8 g/dL (ref 30.0–36.0)
MCV: 93.4 fL (ref 78.0–100.0)
MPV: 10.7 fL (ref 8.6–12.4)
Monocytes Absolute: 0.5 10*3/uL (ref 0.1–1.0)
Monocytes Relative: 6 % (ref 3–12)
Neutro Abs: 5.5 10*3/uL (ref 1.7–7.7)
Neutrophils Relative %: 66 % (ref 43–77)
Platelets: 311 10*3/uL (ref 150–400)
RBC: 3.93 MIL/uL (ref 3.87–5.11)
RDW: 12.6 % (ref 11.5–15.5)
WBC: 8.4 10*3/uL (ref 4.0–10.5)

## 2015-08-16 LAB — IRON: Iron: 87 ug/dL (ref 45–160)

## 2015-08-16 LAB — FERRITIN: Ferritin: 55 ng/mL (ref 10–291)

## 2015-08-21 ENCOUNTER — Other Ambulatory Visit: Payer: Self-pay | Admitting: Gastroenterology

## 2015-08-21 DIAGNOSIS — D509 Iron deficiency anemia, unspecified: Secondary | ICD-10-CM

## 2015-08-21 NOTE — Progress Notes (Signed)
Quick Note:  Her Hgb, iron, ferritin are normal and stable. Continue iron at least 3 times per weekly. Repeat CBC, iron/tibc, ferritin in 4 months. ______

## 2015-08-26 ENCOUNTER — Other Ambulatory Visit: Payer: Self-pay | Admitting: Internal Medicine

## 2015-09-16 ENCOUNTER — Ambulatory Visit (INDEPENDENT_AMBULATORY_CARE_PROVIDER_SITE_OTHER): Payer: Medicare Other | Admitting: Gastroenterology

## 2015-09-16 ENCOUNTER — Encounter: Payer: Self-pay | Admitting: Gastroenterology

## 2015-09-16 VITALS — BP 148/83 | HR 65 | Temp 97.2°F | Ht 60.0 in | Wt 128.4 lb

## 2015-09-16 DIAGNOSIS — D509 Iron deficiency anemia, unspecified: Secondary | ICD-10-CM | POA: Diagnosis not present

## 2015-09-16 DIAGNOSIS — K219 Gastro-esophageal reflux disease without esophagitis: Secondary | ICD-10-CM

## 2015-09-16 MED ORDER — FUSION PLUS PO CAPS: ORAL_CAPSULE | ORAL | Status: DC

## 2015-09-16 MED ORDER — PANTOPRAZOLE SODIUM 40 MG PO TBEC: 40.0000 mg | DELAYED_RELEASE_TABLET | Freq: Every day | ORAL | Status: DC

## 2015-09-16 NOTE — Progress Notes (Signed)
      Primary Care Physician: [place]    Primary Gastroenterologist:  Garfield Cornea, MD   Chief Complaint  Patient presents with  . Follow-up    Wanda [name] and IDA/ doing better    HPI: Wanda [name] is a 81 y.o. female here for six-month follow-up of IDA/GERD/large hiatal hernia. Last seen in April. Continues to have chronic postprandial fecal urgency, bowel movement 2-3 times per day. Stools are formed. Rarely loose. Tried Bentyl but really didn't notice much difference. Doesn't seem to be bothering her much at this time. Denies blood in the stool or melena. Heartburn well-controlled on pantoprazole.  Most recent labs from September 29. Iron 87 down from 135, hemoglobin stable at 12.4, hematocrit 36.7 ferritin slightly improved at 55 which is normal. She has been on iron 3 times weekly since these last labs.   Current Outpatient Prescriptions  Medication Sig Dispense Refill  . Cholecalciferol (VITAMIN D) 2000 UNITS CAPS Take 1 capsule by mouth 3 (three) times a week.    . Iron-FA-B Cmp-C-Biot-Probiotic (FUSION PLUS) CAPS TAKE (1) CAPSULE BY MOUTH ONCE DAILY. 30 capsule 5  . lisinopril-hydrochlorothiazide (PRINZIDE,ZESTORETIC) 20-12.5 MG per tablet Take 1 tablet by mouth daily.    . pantoprazole (PROTONIX) 40 MG tablet TAKE 1 TABLET BY MOUTH ONCE DAILY. 30 tablet 11   No current facility-administered medications for this visit.    Allergies as of 09/16/2015 - Review Complete 09/16/2015  Allergen Reaction Noted  . Naproxen sodium Rash and Other (See Comments)     ROS:  General: Negative for anorexia, weight loss, fever, chills, fatigue, weakness. ENT: Negative for hoarseness, difficulty swallowing , nasal congestion. CV: Negative for chest pain, angina, palpitations, dyspnea on exertion, peripheral edema.  Respiratory: Negative for dyspnea at rest, dyspnea on exertion, cough, sputum, wheezing.  GI: See history of present illness. GU:  Negative for dysuria,  hematuria, urinary incontinence, urinary frequency, nocturnal urination.  Endo: Negative for unusual weight change.    Physical Examination:   BP 148/83 mmHg  Pulse 65  Temp(Src) 97.2 F (36.2 C) (Oral)  Ht 5' (1.524 m)  Wt 128 lb 6.4 oz (58.242 kg)  BMI 25.08 kg/m2  General: Well-nourished, well-developed in no acute distress.  Eyes: No icterus. Mouth: Oropharyngeal mucosa moist and pink , no lesions erythema or exudate. Lungs: Clear to auscultation bilaterally.  Heart: Regular rate and rhythm, no murmurs rubs or gallops.  Abdomen: Bowel sounds are normal, nontender, nondistended, no hepatosplenomegaly or masses, no abdominal bruits or hernia , no rebound or guarding.   Extremities: No lower extremity edema. No clubbing or deformities. Neuro: Alert and oriented x 4   Skin: Warm and dry, no jaundice.   Psych: Alert and cooperative, normal mood and affect.

## 2015-09-16 NOTE — Patient Instructions (Signed)
1. We will remind you when your blood work is due in four months.  2. Continue iron three times weekly for now. 3. Continue pantoprazole daily for now.  4. Return to the office in 9 months call sooner if needed.

## 2015-09-23 NOTE — Assessment & Plan Note (Signed)
Doing well on pantoprazole 40 mg daily. Continue current regimen. Continue anti-reflex measures. Return to the office in 9 months.

## 2015-09-23 NOTE — Progress Notes (Signed)
CC'D TO PCP °

## 2015-09-23 NOTE — Assessment & Plan Note (Signed)
Improved. Hemoglobin normal, ferritin of. Continue iron 3 times weekly. Recheck labs in 4 months. Return to the office in 9 months.

## 2015-11-22 ENCOUNTER — Other Ambulatory Visit: Payer: Self-pay

## 2015-11-22 DIAGNOSIS — D509 Iron deficiency anemia, unspecified: Secondary | ICD-10-CM

## 2015-12-30 LAB — IRON AND TIBC
%SAT: 23 % (ref 11–50)
Iron: 77 ug/dL (ref 45–160)
TIBC: 333 ug/dL (ref 250–450)
UIBC: 256 ug/dL (ref 125–400)

## 2015-12-30 LAB — FERRITIN: Ferritin: 137 ng/mL (ref 20–288)

## 2016-01-01 NOTE — Progress Notes (Signed)
Quick Note:  Iron studies are normal. Ferritin up. Continue iron 2-3 times per week. Repeat CBC, ferritin, iron/tibc in 05/2016.  Recommend keeping OV in 05/2016 with RMR as planned.   She has an appointment next week with me, if she is not having any problems she can cancel, otherwise if she is having problems keep the apptn. ______

## 2016-01-06 ENCOUNTER — Other Ambulatory Visit: Payer: Self-pay | Admitting: Gastroenterology

## 2016-01-06 DIAGNOSIS — D509 Iron deficiency anemia, unspecified: Secondary | ICD-10-CM

## 2016-01-07 ENCOUNTER — Ambulatory Visit: Payer: Medicare Other | Admitting: Gastroenterology

## 2016-01-07 NOTE — Progress Notes (Signed)
Quick Note:    Noted    ______

## 2016-04-20 ENCOUNTER — Other Ambulatory Visit: Payer: Self-pay

## 2016-04-20 DIAGNOSIS — D509 Iron deficiency anemia, unspecified: Secondary | ICD-10-CM

## 2016-04-23 ENCOUNTER — Encounter: Payer: Self-pay | Admitting: Internal Medicine

## 2016-05-06 LAB — CBC WITH DIFFERENTIAL/PLATELET
Basophils Absolute: 0 cells/uL (ref 0–200)
Basophils Relative: 0 %
Eosinophils Absolute: 184 cells/uL (ref 15–500)
Eosinophils Relative: 2 %
HCT: 36.2 % (ref 35.0–45.0)
Hemoglobin: 11.6 g/dL — ABNORMAL LOW (ref 11.7–15.5)
Lymphocytes Relative: 25 %
Lymphs Abs: 2300 cells/uL (ref 850–3900)
MCH: 29.8 pg (ref 27.0–33.0)
MCHC: 32 g/dL (ref 32.0–36.0)
MCV: 93.1 fL (ref 80.0–100.0)
MPV: 11.2 fL (ref 7.5–12.5)
Monocytes Absolute: 736 cells/uL (ref 200–950)
Monocytes Relative: 8 %
Neutro Abs: 5980 cells/uL (ref 1500–7800)
Neutrophils Relative %: 65 %
Platelets: 331 10*3/uL (ref 140–400)
RBC: 3.89 MIL/uL (ref 3.80–5.10)
RDW: 13.2 % (ref 11.0–15.0)
WBC: 9.2 10*3/uL (ref 3.8–10.8)

## 2016-05-06 LAB — IRON AND TIBC
%SAT: 12 % (ref 11–50)
Iron: 38 ug/dL — ABNORMAL LOW (ref 45–160)
TIBC: 314 ug/dL (ref 250–450)
UIBC: 276 ug/dL (ref 125–400)

## 2016-05-06 LAB — FERRITIN: Ferritin: 32 ng/mL (ref 20–288)

## 2016-05-06 NOTE — Progress Notes (Signed)
Quick Note:  Some decline in her iron, ferritin, hgb over past four months.   Is she still on iron and what dose? Does she still see hematologist? ______

## 2016-05-07 ENCOUNTER — Encounter: Payer: Self-pay | Admitting: Internal Medicine

## 2016-06-03 NOTE — Progress Notes (Signed)
Quick Note:  Noted. Continue iron as before. Will discuss at upcoming OV and plan for repeat labs at that time. ______

## 2016-06-23 ENCOUNTER — Other Ambulatory Visit: Payer: Self-pay | Admitting: Gastroenterology

## 2016-06-23 ENCOUNTER — Ambulatory Visit (INDEPENDENT_AMBULATORY_CARE_PROVIDER_SITE_OTHER): Payer: Medicare Other | Admitting: Gastroenterology

## 2016-06-23 ENCOUNTER — Encounter: Payer: Self-pay | Admitting: Gastroenterology

## 2016-06-23 VITALS — BP 144/81 | HR 59 | Temp 97.5°F | Ht 60.0 in | Wt 126.2 lb

## 2016-06-23 DIAGNOSIS — D509 Iron deficiency anemia, unspecified: Secondary | ICD-10-CM

## 2016-06-23 NOTE — Patient Instructions (Signed)
1. Take iron every day. 2. Repeat labs in 2 months. We will remind you to have this done.  Iron-Rich Diet Iron is a mineral that helps your body to produce hemoglobin. Hemoglobin is a protein in your red blood cells that carries oxygen to your body's tissues. Eating too little iron may cause you to feel weak and tired, and it can increase your risk for infection. Eating enough iron is necessary for your body's metabolism, muscle function, and nervous system. Iron is naturally found in many foods. It can also be added to foods or fortified in foods. There are two types of dietary iron:  Heme iron. Heme iron is absorbed by the body more easily than nonheme iron. Heme iron is found in meat, poultry, and fish.  Nonheme iron. Nonheme iron is found in dietary supplements, iron-fortified grains, beans, and vegetables. You may need to follow an iron-rich diet if:  You have been diagnosed with iron deficiency or iron-deficiency anemia.  You have a condition that prevents you from absorbing dietary iron, such as:  Infection in your intestines.  Celiac disease. This involves long-lasting (chronic) inflammation of your intestines.  You do not eat enough iron.  You eat a diet that is high in foods that impair iron absorption.  You have lost a lot of blood.  You have heavy bleeding during your menstrual cycle.  You are pregnant. WHAT IS MY PLAN? Your health care provider may help you to determine how much iron you need per day based on your condition. Generally, when a person consumes sufficient amounts of iron in the diet, the following iron needs are met:  Men.  68-56 years old: 11 mg per day.  47-17 years old: 8 mg per day.  Women.   52-24 years old: 15 mg per day.  61-34 years old: 18 mg per day.  Over 2 years old: 8 mg per day.  Pregnant women: 27 mg per day.  Breastfeeding women: 9 mg per day. WHAT DO I NEED TO KNOW ABOUT AN IRON-RICH DIET?  Eat fresh fruits and  vegetables that are high in vitamin C along with foods that are high in iron. This will help increase the amount of iron that your body absorbs from food, especially with foods containing nonheme iron. Foods that are high in vitamin C include oranges, peppers, tomatoes, and mango.  Take iron supplements only as directed by your health care provider. Overdose of iron can be life-threatening. If you were prescribed iron supplements, take them with orange juice or a vitamin C supplement.  Cook foods in pots and pans that are made from iron.   Eat nonheme iron-containing foods alongside foods that are high in heme iron. This helps to improve your iron absorption.   Certain foods and drinks contain compounds that impair iron absorption. Avoid eating these foods in the same meal as iron-rich foods or with iron supplements. These include:  Coffee, black tea, and red wine.  Milk, dairy products, and foods that are high in calcium.  Beans, soybeans, and peas.  Whole grains.  When eating foods that contain both nonheme iron and compounds that impair iron absorption, follow these tips to absorb iron better.   Soak beans overnight before cooking.  Soak whole grains overnight and drain them before using.  Ferment flours before baking, such as using yeast in bread dough. WHAT FOODS CAN I EAT? Grains Iron-fortified breakfast cereal. Iron-fortified whole-wheat bread. Enriched rice. Sprouted grains. Vegetables Spinach. Potatoes with skin. [place]  peas. Broccoli. Red and green bell peppers. Fermented vegetables. Fruits Prunes. Raisins. Oranges. Strawberries. Mango. Grapefruit. Meats and Other Protein Sources Beef liver. Oysters. Beef. Shrimp. [place]. Chicken. [place]. Sardines. Chickpeas. Nuts. Tofu. Beverages Tomato juice. Fresh orange juice. Prune juice. Hibiscus tea. Fortified instant breakfast shakes. Condiments Tahini. Fermented soy sauce. Sweets and Desserts Black-strap molasses.   Other Wheat germ. The items listed above may not be a complete list of recommended foods or beverages. Contact your dietitian for more options. WHAT FOODS ARE NOT RECOMMENDED? Grains Whole grains. Bran cereal. Bran flour. Oats. Vegetables Artichokes. Brussels sprouts. Kale. Fruits Blueberries. Raspberries. Strawberries. Figs. Meats and Other Protein Sources Soybeans. Products made from soy protein. Dairy Milk. Cream. Cheese. Yogurt. Cottage cheese. Beverages Coffee. Black tea. Red wine. Sweets and Desserts Cocoa. Chocolate. Ice cream. Other Basil. Oregano. Parsley. The items listed above may not be a complete list of foods and beverages to avoid. Contact your dietitian for more information.   This information is not intended to replace advice given to you by your health care provider. Make sure you discuss any questions you have with your health care provider.   Document Released: 06/16/2005 Document Revised: 11/23/2014 Document Reviewed: 05/30/2014 Elsevier Interactive Patient Education [employer].

## 2016-06-23 NOTE — Assessment & Plan Note (Signed)
Slight decline in hemoglobin, iron and ferritin. No overt GI bleeding. No other alarm symptoms. Recently started back on iron daily. Patient will like to avoid procedures if possible. Her last EGD was last year, last colonoscopy 2013, Givens capsule study in 2013.  Continue iron daily. Recheck labs in 2 months.

## 2016-06-23 NOTE — Progress Notes (Signed)
Primary Care Physician: No PCP Per Patient  Primary Gastroenterologist:  Garfield Cornea, MD   Chief Complaint  Patient presents with  . Follow-up    Still feeling tired    HPI: Wanda [name] is a 82 y.o. female hereFor follow-up of IDA/GERD/large hiatal hernia. She was last seen in October 2016. Last labs were in June. Her iron came down from 77-38, ferritin down from 137-32, hemoglobin down from 12.4-11.6. She was on iron 3 times weekly. Increase to daily use 2 weeks ago. Generally has a bowel movement about 3 times per day. She seems to have less stool frequency when on OTC iron as opposed to fusion plus. No melena or rectal bleeding. She eats red meat about twice per week. Weight has been stable this year. Denies abdominal pain. Heartburn well-controlled.   Current Outpatient Prescriptions  Medication Sig Dispense Refill  . Cholecalciferol (VITAMIN D) 2000 UNITS CAPS Take 1 capsule by mouth 3 (three) times a week.    . Iron-FA-B Cmp-C-Biot-Probiotic (FUSION PLUS) CAPS Take one capsule by mouth three times per week. 30 capsule 5  . lisinopril-hydrochlorothiazide (PRINZIDE,ZESTORETIC) 20-12.5 MG per tablet Take 1 tablet by mouth daily.    . pantoprazole (PROTONIX) 40 MG tablet Take 1 tablet (40 mg total) by mouth daily. 30 tablet 11   No current facility-administered medications for this visit.     Allergies as of 06/23/2016 - Review Complete 06/23/2016  Allergen Reaction Noted  . Sulfa antibiotics  06/23/2016  . Naproxen sodium Rash and Other (See Comments)    Past Surgical History:  Procedure Laterality Date  . ANTERIOR AND POSTERIOR REPAIR  05/17/2012   Procedure: ANTERIOR (CYSTOCELE) AND POSTERIOR REPAIR (RECTOCELE);  Surgeon: Jonnie Kind, MD;  Location: [facility];  Service: Gynecology;  Laterality: N/A;  . COLONOSCOPY  02/29/2012   Dr. Gala Romney: colonic diverticulosis, minimal internal hemorrhoids, benign polyp  . ESOPHAGOGASTRODUODENOSCOPY  02/29/2012   Dr. Gala Romney:  atonic esophagus, moderate-sized hiatal hernia, fundal gland polyps  . ESOPHAGOGASTRODUODENOSCOPY N/A 10/02/2013   Dr. Gala Romney- multiple Wilhemena Durie gastric polyps- not manipulated  . ESOPHAGOGASTRODUODENOSCOPY N/A 01/14/2015   Dr. Gala Romney: Large hiatal hernia, otherwise normal EGD  . GIVENS CAPSULE STUDY  02/29/2012   No explanation for IDA. Possible extrinsic compression but negative CT.   Marland Kitchen SALPINGOOPHORECTOMY  05/17/2012   Procedure: SALPINGO OOPHERECTOMY;  Surgeon: Jonnie Kind, MD;  Location: [facility];  Service: Gynecology;  Laterality: Bilateral;  . VAGINAL HYSTERECTOMY  05/17/2012   Procedure: HYSTERECTOMY VAGINAL;  Surgeon: Jonnie Kind, MD;  Location: [facility];  Service: Gynecology;  Laterality: N/A;    ROS:  General: Negative for anorexia, weight loss, fever, chills, fatigue, weakness. ENT: Negative for hoarseness, difficulty swallowing , nasal congestion. CV: Negative for chest pain, angina, palpitations, dyspnea on exertion, peripheral edema.  Respiratory: Negative for dyspnea at rest, dyspnea on exertion, cough, sputum, wheezing.  GI: See history of present illness. GU:  Negative for dysuria, hematuria, urinary incontinence, urinary frequency, nocturnal urination.  Endo: Negative for unusual weight change.    Physical Examination:   BP (!) 144/81   Pulse (!) 59   Temp 97.5 F (36.4 C) (Oral)   Ht 5' (1.524 m)   Wt 126 lb 3.2 oz (57.2 kg)   BMI 24.65 kg/m   General: Well-nourished, well-developed in no acute distress.  Eyes: No icterus. Mouth: Oropharyngeal mucosa moist and pink , no lesions erythema or exudate. Lungs: Clear to auscultation bilaterally.  Heart: Regular rate  and rhythm, no murmurs rubs or gallops.  Abdomen: Bowel sounds are normal, nontender, nondistended, no hepatosplenomegaly or masses, no abdominal bruits or hernia , no rebound or guarding.   Extremities: No lower extremity edema. No clubbing or deformities. Neuro: Alert and oriented x 4     Skin: Warm and dry, no jaundice.   Psych: Alert and cooperative, normal mood and affect.  Labs:  Lab Results  Component Value Date   IRON 38 (L) 05/05/2016   TIBC 314 05/05/2016   FERRITIN 32 05/05/2016   Lab Results  Component Value Date   WBC 9.2 05/05/2016   HGB 11.6 (L) 05/05/2016   HCT 36.2 05/05/2016   MCV 93.1 05/05/2016   PLT 331 05/05/2016   Lab Results  Component Value Date   FERRITIN 32 05/05/2016    Imaging Studies: No results found.

## 2016-06-23 NOTE — Progress Notes (Signed)
cc'ed to pcp °

## 2016-07-09 ENCOUNTER — Emergency Department (HOSPITAL_COMMUNITY)
Admission: EM | Admit: 2016-07-09 | Discharge: 2016-07-09 | Disposition: A | Discharge status: Home / Self Care | Payer: Medicare Other | Attending: Emergency Medicine | Admitting: Emergency Medicine

## 2016-07-09 ENCOUNTER — Emergency Department (HOSPITAL_COMMUNITY): Payer: Medicare Other

## 2016-07-09 ENCOUNTER — Encounter (HOSPITAL_COMMUNITY): Payer: Self-pay | Admitting: *Deleted

## 2016-07-09 DIAGNOSIS — S9032XA Contusion of left foot, initial encounter: Secondary | ICD-10-CM | POA: Diagnosis not present

## 2016-07-09 DIAGNOSIS — Y92009 Unspecified place in unspecified non-institutional (private) residence as the place of occurrence of the external cause: Secondary | ICD-10-CM | POA: Diagnosis not present

## 2016-07-09 DIAGNOSIS — Z79899 Other long term (current) drug therapy: Secondary | ICD-10-CM | POA: Diagnosis not present

## 2016-07-09 DIAGNOSIS — S99922A Unspecified injury of left foot, initial encounter: Secondary | ICD-10-CM | POA: Diagnosis present

## 2016-07-09 DIAGNOSIS — W109XXA Fall (on) (from) unspecified stairs and steps, initial encounter: Secondary | ICD-10-CM | POA: Diagnosis not present

## 2016-07-09 DIAGNOSIS — Y939 Activity, unspecified: Secondary | ICD-10-CM | POA: Insufficient documentation

## 2016-07-09 DIAGNOSIS — I10 Essential (primary) hypertension: Secondary | ICD-10-CM | POA: Diagnosis not present

## 2016-07-09 DIAGNOSIS — Y999 Unspecified external cause status: Secondary | ICD-10-CM | POA: Insufficient documentation

## 2016-07-09 NOTE — Discharge Instructions (Signed)
Ice to your foot intermittently today.  Minimize your activity and weight-bearing until the soft tissue swelling and pain improved.

## 2016-07-09 NOTE — ED Provider Notes (Signed)
[facility] DEPT Provider Note   CSN: [id number] Arrival date & time: 07/09/16  [id number]     History   Chief Complaint Chief Complaint  Patient presents with  . Fall    HPI Wanda [name] is a 82 y.o. female.  She fell at the bottom of her house steps yesterday.  She injured her right knee, and both fee. Main injury is let foot,and she is limping.  She noticed swelling to dorsum of lt foot this am.   Did not strike or injure her head neck or back. No upper extremity injuries.  HPI  Past Medical History:  Diagnosis Date  . Cystocele   . Diverticulosis 10/20/10   Left-sided on colonoscopy by Dr. Gala Romney 10/20/10  . Gastric polyps 10/02/2013  . Gastritis 12/08/10   EGD by Dr. Altha Harm hiatal hernia, duodenal diverticulum, chronic gastritis  . GERD (gastroesophageal reflux disease)   . Heart murmur   . Hiatal hernia 10/02/2013  . HTN (hypertension)   . IDA (iron deficiency anemia)   . 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 Osteoarthritis   . Radial fracture    (right) Undergoing treatment by Dr. Aline Brochure currently  . Sleep apnea    STOP BANG SCORE 4  . Urge incontinence   . Vitamin D deficiency     Patient Active Problem List   Diagnosis Date Noted  . Diarrhea, functional 03/15/2015  . Fatigue 12/28/2014  . Upper abdominal pain 12/28/2014  . B12 deficiency 12/28/2014    Class: History of  . Acute blood loss anemia 10/02/2013  . GI bleed 10/02/2013  . Melena 10/02/2013  . Hematemesis 10/02/2013  . Sinus tachycardia ([place]) 10/02/2013  . 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 Hiatal hernia 10/02/2013  . Gastric polyps 10/02/2013  . HTN (hypertension)   . IDA (iron deficiency anemia)   . Adhesive capsulitis of right shoulder 03/01/2012  . Iron deficiency anemia 02/02/2012  . Distal radius fracture 11/30/2011  . Diverticulosis 10/20/2010  . Normocytic anemia 10/01/2010  . GERD 10/01/2010  . FECAL OCCULT BLOOD 10/01/2010    Past Surgical History:    Procedure Laterality Date  . ANTERIOR AND POSTERIOR REPAIR  05/17/2012   Procedure: ANTERIOR (CYSTOCELE) AND POSTERIOR REPAIR (RECTOCELE);  Surgeon: Jonnie Kind, MD;  Location: [facility];  Service: Gynecology;  Laterality: N/A;  . COLONOSCOPY  02/29/2012   Dr. Gala Romney: colonic diverticulosis, minimal internal hemorrhoids, benign polyp  . ESOPHAGOGASTRODUODENOSCOPY  02/29/2012   Dr. Gala Romney: atonic esophagus, moderate-sized hiatal hernia, fundal gland polyps  . ESOPHAGOGASTRODUODENOSCOPY N/A 10/02/2013   Dr. Gala Romney- multiple Wilhemena Durie gastric polyps- not manipulated  . ESOPHAGOGASTRODUODENOSCOPY N/A 01/14/2015   Dr. Gala Romney: Large hiatal hernia, otherwise normal EGD  . GIVENS CAPSULE STUDY  02/29/2012   No explanation for IDA. Possible extrinsic compression but negative CT.   Marland Kitchen SALPINGOOPHORECTOMY  05/17/2012   Procedure: SALPINGO OOPHERECTOMY;  Surgeon: Jonnie Kind, MD;  Location: [facility];  Service: Gynecology;  Laterality: Bilateral;  . VAGINAL HYSTERECTOMY  05/17/2012   Procedure: HYSTERECTOMY VAGINAL;  Surgeon: Jonnie Kind, MD;  Location: [facility];  Service: Gynecology;  Laterality: N/A;    OB History    No data available       Home Medications    Prior to Admission medications   Medication Sig Start Date End Date Taking? Authorizing Provider  Cholecalciferol (VITAMIN D) 2000 UNITS CAPS Take 1 capsule by mouth 3 (three) times a week.    Historical Provider, MD  Iron-FA-B Cmp-C-Biot-Probiotic (FUSION PLUS)  CAPS Take one capsule by mouth three times per week. 09/16/15   Mahala Menghini, PA-C  lisinopril-hydrochlorothiazide (PRINZIDE,ZESTORETIC) 20-12.5 MG per tablet Take 1 tablet by mouth daily.    Historical Provider, MD  pantoprazole (PROTONIX) 40 MG tablet Take 1 tablet (40 mg total) by mouth daily. 09/16/15   Mahala Menghini, PA-C    Family History Family History  Problem Relation Age of Onset  . Hypertension Mother   . Lymphoma Brother   . Anesthesia problems Neg Hx    . Hypotension Neg Hx   . Malignant hyperthermia Neg Hx   . Pseudochol deficiency Neg Hx   . Colon cancer Neg Hx     Social History Social History  Substance Use Topics  . Smoking status: Never Smoker  . Smokeless tobacco: Never Used     Comment: Never smoked  . Alcohol use No     Allergies   Sulfa antibiotics and Naproxen sodium   Review of Systems Review of Systems  Constitutional: Negative for appetite change, chills, diaphoresis, fatigue and fever.  HENT: Negative for mouth sores, sore throat and trouble swallowing.   Eyes: Negative for visual disturbance.  Respiratory: Negative for cough, chest tightness, shortness of breath and wheezing.   Cardiovascular: Negative for chest pain.  Gastrointestinal: Negative for abdominal distention, abdominal pain, diarrhea, nausea and vomiting.  Endocrine: Negative for polydipsia, polyphagia and polyuria.  Genitourinary: Negative for dysuria, frequency and hematuria.  Musculoskeletal: Positive for arthralgias and gait problem.  Skin: Negative for color change, pallor and rash.  Neurological: Negative for dizziness, syncope, light-headedness and headaches.  Hematological: Does not bruise/bleed easily.  Psychiatric/Behavioral: Negative for behavioral problems and confusion.     Physical Exam Updated Vital Signs BP 161/80 (BP Location: Left Arm)   Pulse 75   Temp 98.4 F (36.9 C) (Oral)   Resp 18   Ht 5' (1.524 m)   Wt 126 lb (57.2 kg)   SpO2 99%   BMI 24.61 kg/m   Physical Exam  Constitutional: She is oriented to person, place, and time. She appears well-developed and well-nourished. No distress.  HENT:  Head: Normocephalic.  Eyes: Conjunctivae are normal. Pupils are equal, round, and reactive to light. No scleral icterus.  Neck: Normal range of motion. Neck supple. No thyromegaly present.  Cardiovascular: Normal rate and regular rhythm.  Exam reveals no gallop and no friction rub.   No murmur heard. Pulmonary/Chest:  Effort normal and breath sounds normal. No respiratory distress. She has no wheezes. She has no rales.  Abdominal: Soft. Bowel sounds are normal. She exhibits no distension. There is no tenderness. There is no rebound.  Musculoskeletal: Normal range of motion.  Ecchymosis, but the range of motion to right knee overlying the patella. Normal exam to the right foot but states she has pain with weightbearing. Soft tissue swelling and ecchymosis to the dorsum of the left foot from the malleoli to the mid forefoot.  Neurological: She is alert and oriented to person, place, and time.  Skin: Skin is warm and dry. No rash noted.  Psychiatric: She has a normal mood and affect. Her behavior is normal.     ED Treatments / Results  Labs (all labs ordered are listed, but only abnormal results are displayed) Labs Reviewed - No data to display  EKG  EKG Interpretation None       Radiology Dg Ankle Complete Left  Result Date: 07/09/2016 CLINICAL DATA:  Left foot and left ankle pain status post fall. Bruising  to the left foot. EXAM: LEFT FOOT - COMPLETE 3+ VIEW; LEFT ANKLE COMPLETE - 3+ VIEW COMPARISON:  None. FINDINGS: There is no evidence of fracture or dislocation. Mild osteoarthritic changes of the midfoot noted. There is no evidence of focal bone abnormality. There swelling about the medial malleolus. IMPRESSION: No acute fracture or dislocation identified about the left foot. No acute fracture or dislocation identified about the left ankle. Soft tissue swelling about the medial malleolus. Osteoarthritic changes about the midfoot. Electronically Signed   By: Fidela Salisbury M.D.   On: 07/09/2016 09:00   Dg Knee Complete 4 Views Right  Result Date: 07/09/2016 CLINICAL DATA:  Pain following fall EXAM: RIGHT KNEE - COMPLETE 4+ VIEW COMPARISON:  None. FINDINGS: Frontal, lateral, and bilateral oblique views were obtained. There is soft tissue swelling medially. There is no acute fracture or  dislocation. There is no apparent knee joint effusion noted. There is moderate narrowing of the patellofemoral joint. Other joint spaces appear unremarkable. There is extensive chondrocalcinosis. Calcification is noted posterior to the knee joint in the popliteal fossa region. IMPRESSION: No acute fracture or joint effusion. There is soft tissue swelling medially, however. There is moderate narrowing of the patellofemoral joint. There is extensive chondrocalcinosis. Chondrocalcinosis may be seen with osteoarthritis or with calcium pyrophosphate deposition disease. Calcification posterior to the knee joint may be indicative of calcified hematoma or calcification in complex Baker cyst. A calcified neoplasm in this area would be a statistically much less likely differential consideration. Electronically Signed   By: Lowella Grip III M.D.   On: 07/09/2016 09:00   Dg Foot Complete Left  Result Date: 07/09/2016 CLINICAL DATA:  Left foot and left ankle pain status post fall. Bruising to the left foot. EXAM: LEFT FOOT - COMPLETE 3+ VIEW; LEFT ANKLE COMPLETE - 3+ VIEW COMPARISON:  None. FINDINGS: There is no evidence of fracture or dislocation. Mild osteoarthritic changes of the midfoot noted. There is no evidence of focal bone abnormality. There swelling about the medial malleolus. IMPRESSION: No acute fracture or dislocation identified about the left foot. No acute fracture or dislocation identified about the left ankle. Soft tissue swelling about the medial malleolus. Osteoarthritic changes about the midfoot. Electronically Signed   By: Fidela Salisbury M.D.   On: 07/09/2016 09:00   Dg Foot Complete Right  Result Date: 07/09/2016 CLINICAL DATA:  Pain following fall EXAM: RIGHT FOOT COMPLETE - 3+ VIEW COMPARISON:  None. FINDINGS: Frontal, oblique, and lateral views were obtained. There is evidence of a prior fracture of the distal second metatarsal with remodeling and callus formation in this area. There is  no acute appearing fracture or dislocation. Joint spaces appear normal. There is evidence of early bunion formation at the medial first MTP joint level. There are foci of arterial vascular calcification evident. IMPRESSION: Healing fracture distal second metatarsal with callus and remodeling in this area. No acute fracture or dislocation. Early bunion formation is noted medial to the first MTP joint. There is no appreciable erosion or joint space narrowing. There are scattered foci of atherosclerotic vascular calcification. Electronically Signed   By: Lowella Grip III M.D.   On: 07/09/2016 08:58    Procedures Procedures (including critical care time)  Medications Ordered in ED Medications - No data to display   Initial Impression / Assessment and Plan / ED Course  I have reviewed the triage vital signs and the nursing notes.  Pertinent labs & imaging results that were available during my care of the patient  were reviewed by me and considered in my medical decision making (see chart for details).  Clinical Course      Final Clinical Impressions(s) / ED Diagnoses   Final diagnoses:  Foot contusion, left, initial encounter    New Prescriptions New Prescriptions   No medications on file     Tanna Furry, MD 07/09/16 [phone]

## 2016-07-09 NOTE — ED Triage Notes (Signed)
Pt comes in for fall yesterday. She was going down steps while holding the rail and fell. She is having pain to her left and right foot and right knee. Denies any hip pain, denies hitting her head, denies any blood thinner use.

## 2016-08-05 ENCOUNTER — Other Ambulatory Visit: Payer: Self-pay

## 2016-08-05 DIAGNOSIS — D509 Iron deficiency anemia, unspecified: Secondary | ICD-10-CM

## 2016-08-18 LAB — CBC WITH DIFFERENTIAL/PLATELET
Basophils Absolute: 0 cells/uL (ref 0–200)
Basophils Relative: 0 %
Eosinophils Absolute: 158 cells/uL (ref 15–500)
Eosinophils Relative: 2 %
HCT: 36.5 % (ref 35.0–45.0)
Hemoglobin: 12 g/dL (ref 11.7–15.5)
Lymphocytes Relative: 24 %
Lymphs Abs: 1896 cells/uL (ref 850–3900)
MCH: 30.8 pg (ref 27.0–33.0)
MCHC: 32.9 g/dL (ref 32.0–36.0)
MCV: 93.8 fL (ref 80.0–100.0)
MPV: 10.5 fL (ref 7.5–12.5)
Monocytes Absolute: 553 cells/uL (ref 200–950)
Monocytes Relative: 7 %
Neutro Abs: 5293 cells/uL (ref 1500–7800)
Neutrophils Relative %: 67 %
Platelets: 305 10*3/uL (ref 140–400)
RBC: 3.89 MIL/uL (ref 3.80–5.10)
RDW: 12.7 % (ref 11.0–15.0)
WBC: 7.9 10*3/uL (ref 3.8–10.8)

## 2016-08-19 LAB — IRON AND TIBC
%SAT: 21 % (ref 11–50)
Iron: 65 ug/dL (ref 45–160)
TIBC: 316 ug/dL (ref 250–450)
UIBC: 251 ug/dL (ref 125–400)

## 2016-08-19 LAB — FERRITIN: Ferritin: 65 ng/mL (ref 20–288)

## 2016-08-24 NOTE — Progress Notes (Signed)
Please let patient know her iron/hgb are now in normal range, low normal. Recommend continue daily iron.  Repeat CBC, iron/tibc, ferritin in 4 months.

## 2016-08-26 ENCOUNTER — Other Ambulatory Visit: Payer: Self-pay | Admitting: Gastroenterology

## 2016-08-26 DIAGNOSIS — D509 Iron deficiency anemia, unspecified: Secondary | ICD-10-CM

## 2016-09-24 ENCOUNTER — Other Ambulatory Visit: Payer: Self-pay | Admitting: Gastroenterology

## 2016-12-11 ENCOUNTER — Other Ambulatory Visit: Payer: Self-pay

## 2016-12-11 DIAGNOSIS — D509 Iron deficiency anemia, unspecified: Secondary | ICD-10-CM

## 2016-12-30 LAB — CBC WITH DIFFERENTIAL/PLATELET
Basophils Absolute: 89 cells/uL (ref 0–200)
Basophils Relative: 1 %
Eosinophils Absolute: 178 cells/uL (ref 15–500)
Eosinophils Relative: 2 %
HCT: 37.2 % (ref 35.0–45.0)
Hemoglobin: 12 g/dL (ref 11.7–15.5)
Lymphocytes Relative: 26 %
Lymphs Abs: 2314 cells/uL (ref 850–3900)
MCH: 30.1 pg (ref 27.0–33.0)
MCHC: 32.3 g/dL (ref 32.0–36.0)
MCV: 93.2 fL (ref 80.0–100.0)
MPV: 10.7 fL (ref 7.5–12.5)
Monocytes Absolute: 534 cells/uL (ref 200–950)
Monocytes Relative: 6 %
Neutro Abs: 5785 cells/uL (ref 1500–7800)
Neutrophils Relative %: 65 %
Platelets: 380 10*3/uL (ref 140–400)
RBC: 3.99 MIL/uL (ref 3.80–5.10)
RDW: 13.3 % (ref 11.0–15.0)
WBC: 8.9 10*3/uL (ref 3.8–10.8)

## 2016-12-30 LAB — FERRITIN: Ferritin: 31 ng/mL (ref 20–288)

## 2016-12-30 LAB — IRON AND TIBC
%SAT: 8 % — ABNORMAL LOW (ref 11–50)
Iron: 31 ug/dL — ABNORMAL LOW (ref 45–160)
TIBC: 391 ug/dL (ref 250–450)
UIBC: 360 ug/dL (ref 125–400)

## 2016-12-30 NOTE — Progress Notes (Signed)
Trend toward iron deficiency again, Hgb low normal.  Would recommend ifobt and OV with RMR only in 4-6 weeks.

## 2017-01-04 ENCOUNTER — Encounter: Payer: Self-pay | Admitting: Internal Medicine

## 2017-01-11 ENCOUNTER — Ambulatory Visit (INDEPENDENT_AMBULATORY_CARE_PROVIDER_SITE_OTHER): Payer: Medicare Other

## 2017-01-11 DIAGNOSIS — D509 Iron deficiency anemia, unspecified: Secondary | ICD-10-CM | POA: Diagnosis not present

## 2017-01-11 LAB — IFOBT (OCCULT BLOOD): IFOBT: NEGATIVE

## 2017-01-11 NOTE — Progress Notes (Signed)
Pt returned IFOBT test and it was NEGATIVE 

## 2017-01-12 NOTE — Progress Notes (Signed)
ifobt negative, trend toward ida recent but normal h/h. Has ov with rmr next month, she needs to keep appt.

## 2017-01-15 ENCOUNTER — Ambulatory Visit: Payer: Medicare Other | Admitting: Internal Medicine

## 2017-02-02 ENCOUNTER — Ambulatory Visit (INDEPENDENT_AMBULATORY_CARE_PROVIDER_SITE_OTHER): Payer: Medicare Other | Admitting: Internal Medicine

## 2017-02-02 ENCOUNTER — Encounter: Payer: Self-pay | Admitting: Internal Medicine

## 2017-02-02 VITALS — BP 149/80 | HR 69 | Temp 98.0°F | Ht 60.0 in | Wt 124.2 lb

## 2017-02-02 DIAGNOSIS — D508 Other iron deficiency anemias: Secondary | ICD-10-CM | POA: Diagnosis not present

## 2017-02-02 DIAGNOSIS — K219 Gastro-esophageal reflux disease without esophagitis: Secondary | ICD-10-CM

## 2017-02-02 DIAGNOSIS — K449 Diaphragmatic hernia without obstruction or gangrene: Secondary | ICD-10-CM | POA: Diagnosis not present

## 2017-02-02 NOTE — Progress Notes (Signed)
Primary Care Physician:  PROVIDER NOT IN SYSTEM Primary Gastroenterologist:  Dr. Gala Romney  Pre-Procedure History & Physical: HPI:  Wanda [name] is a 83 y.o. female here for here for follow-up of iron deficiency anemia. Her most recent CBC was normal. Continues to be iron deficient but Hemoccult negative. Clinically, has seen any bleeding; has upwards of 3 bowel movements daily -  has a rare bouts of fecal incontinence; months go by and she has no problems. Taking Protonix 40 mg once daily. History of a known large hiatal hernia and [name] lesions. Evaluation last couple years including EGD, TCS and capsule study.  Past Medical History:  Diagnosis Date  . Cystocele   . Diverticulosis 10/20/10   Left-sided on colonoscopy by Dr. Gala Romney 10/20/10  . Gastric polyps 10/02/2013  . Gastritis 12/08/10   EGD by Dr. Altha Harm hiatal hernia, duodenal diverticulum, chronic gastritis  . GERD (gastroesophageal reflux disease)   . Heart murmur   . Hiatal hernia 10/02/2013  . HTN (hypertension)   . IDA (iron deficiency anemia)   . 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 Osteoarthritis   . Radial fracture    (right) Undergoing treatment by Dr. Aline Brochure currently  . Sleep apnea    STOP BANG SCORE 4  . Urge incontinence   . Vitamin D deficiency     Past Surgical History:  Procedure Laterality Date  . ANTERIOR AND POSTERIOR REPAIR  05/17/2012   Procedure: ANTERIOR (CYSTOCELE) AND POSTERIOR REPAIR (RECTOCELE);  Surgeon: Jonnie Kind, MD;  Location: [facility];  Service: Gynecology;  Laterality: N/A;  . COLONOSCOPY  02/29/2012   Dr. Gala Romney: colonic diverticulosis, minimal internal hemorrhoids, benign polyp  . ESOPHAGOGASTRODUODENOSCOPY  02/29/2012   Dr. Gala Romney: atonic esophagus, moderate-sized hiatal hernia, fundal gland polyps  . ESOPHAGOGASTRODUODENOSCOPY N/A 10/02/2013   Dr. Gala Romney- multiple Wilhemena Durie gastric polyps- not manipulated  . ESOPHAGOGASTRODUODENOSCOPY N/A 01/14/2015   Dr.  Gala Romney: Large hiatal hernia, otherwise normal EGD  . GIVENS CAPSULE STUDY  02/29/2012   No explanation for IDA. Possible extrinsic compression but negative CT.   Marland Kitchen SALPINGOOPHORECTOMY  05/17/2012   Procedure: SALPINGO OOPHERECTOMY;  Surgeon: Jonnie Kind, MD;  Location: [facility];  Service: Gynecology;  Laterality: Bilateral;  . VAGINAL HYSTERECTOMY  05/17/2012   Procedure: HYSTERECTOMY VAGINAL;  Surgeon: Jonnie Kind, MD;  Location: [facility];  Service: Gynecology;  Laterality: N/A;    Prior to Admission medications   Medication Sig Start Date End Date Taking? Authorizing Provider  Cholecalciferol (VITAMIN D) 2000 UNITS CAPS Take 1 capsule by mouth 3 (three) times a week.   Yes Historical Provider, MD  ferrous sulfate 325 (65 FE) MG tablet Take 325 mg by mouth daily with breakfast.   Yes Historical Provider, MD  lisinopril-hydrochlorothiazide (PRINZIDE,ZESTORETIC) 20-12.5 MG per tablet Take 1 tablet by mouth daily.   Yes Historical Provider, MD  pantoprazole (PROTONIX) 40 MG tablet TAKE 1 TABLET BY MOUTH ONCE DAILY. 09/25/16  Yes Annitta Needs, NP  Iron-FA-B Cmp-C-Biot-Probiotic (FUSION PLUS) CAPS Take one capsule by mouth three times per week. Patient not taking: Reported on 02/02/2017 09/16/15   Mahala Menghini, PA-C    Allergies as of 02/02/2017 - Review Complete 02/02/2017  Allergen Reaction Noted  . Sulfa antibiotics  06/23/2016  . Naproxen sodium Rash and Other (See Comments)     Family History  Problem Relation Age of Onset  . Hypertension Mother   . Lymphoma Brother   . Anesthesia problems Neg Hx   .  Hypotension Neg Hx   . Malignant hyperthermia Neg Hx   . Pseudochol deficiency Neg Hx   . Colon cancer Neg Hx     Social History   Social History  . Marital status: Widowed    Spouse name: N/A  . Number of children: N/A  . Years of education: N/A   Occupational History  . Retired Retired   Social History Main Topics  . Smoking status: Never Smoker  . Smokeless tobacco:  Never Used     Comment: Never smoked  . Alcohol use No  . Drug use: No  . Sexual activity: Not Currently    Birth control/ protection: Surgical   Other Topics Concern  . Not on file   Social History Narrative  . No narrative on file    Review of Systems: See HPI, otherwise negative ROS  Physical Exam: BP (!) 149/80   Pulse 69   Temp 98 F (36.7 C) (Oral)   Ht 5' (1.524 m)   Wt 124 lb 3.2 oz (56.3 kg)   BMI 24.26 kg/m  General:   Alert,   pleasant and cooperative in NAD Neck:  Supple; no masses or thyromegaly. No significant cervical adenopathy. Lungs:  Clear throughout to auscultation.   No wheezes, crackles, or rhonchi. No acute distress. Heart:  Regular rate and rhythm; no murmurs, clicks, rubs,  or gallops. Abdomen: Non-distended, normal bowel sounds.  Soft and nontender without appreciable mass or hepatosplenomegaly.  Pulses:  Normal pulses noted. Extremities:  Without clubbing or edema.  Impression:  Pleasant almost 83 year old lady with the chronic iron deficiency. Hemoccult negative recently. Large hiatal hernia and known [name] lesions. No GI symptoms at this time.  I suspect she has a combination of poor iron absorption and intermittent bleeding related to gastric mucosal trauma.  I do not feel that she needs further GI evaluation evaluation at this time.  We talked about the risk and benefits of daily PPI therapy. It may be worthwhile in this setting to drop back to every other day dosing on her Protonix to see if iron absorption Can be enhanced as long as her symptoms are controlled.  Recommendations:  Continue over the counter Iron  May decrease Protonix to every other day  CBC in 4 months just before office visit   Office visit in 4 months    Notice: This dictation was prepared with Dragon dictation along with smaller phrase technology. Any transcriptional errors that result from this process are unintentional and may not be corrected upon review.

## 2017-02-02 NOTE — Patient Instructions (Addendum)
Continue over the counter Iron  May decrease Protonix to every other day  CBC in 4 months just before office visit   Office visit in 4 months

## 2017-02-10 ENCOUNTER — Other Ambulatory Visit: Payer: Self-pay | Admitting: Internal Medicine

## 2017-02-10 DIAGNOSIS — D649 Anemia, unspecified: Secondary | ICD-10-CM

## 2017-04-22 ENCOUNTER — Other Ambulatory Visit: Payer: Self-pay

## 2017-04-22 DIAGNOSIS — D649 Anemia, unspecified: Secondary | ICD-10-CM

## 2017-04-26 ENCOUNTER — Encounter: Payer: Self-pay | Admitting: Internal Medicine

## 2017-05-18 LAB — CBC WITH DIFFERENTIAL/PLATELET
Basophils Absolute: 170 cells/uL (ref 0–200)
Basophils Relative: 2 %
Eosinophils Absolute: 255 cells/uL (ref 15–500)
Eosinophils Relative: 3 %
HCT: 31.4 % — ABNORMAL LOW (ref 35.0–45.0)
Hemoglobin: 10 g/dL — ABNORMAL LOW (ref 11.7–15.5)
Lymphocytes Relative: 20 %
Lymphs Abs: 1700 cells/uL (ref 850–3900)
MCH: 29.8 pg (ref 27.0–33.0)
MCHC: 31.8 g/dL — ABNORMAL LOW (ref 32.0–36.0)
MCV: 93.5 fL (ref 80.0–100.0)
MPV: 10.1 fL (ref 7.5–12.5)
Monocytes Absolute: 595 cells/uL (ref 200–950)
Monocytes Relative: 7 %
Neutro Abs: 5780 cells/uL (ref 1500–7800)
Neutrophils Relative %: 68 %
Platelets: 375 10*3/uL (ref 140–400)
RBC: 3.36 MIL/uL — ABNORMAL LOW (ref 3.80–5.10)
RDW: 13.6 % (ref 11.0–15.0)
WBC: 8.5 10*3/uL (ref 3.8–10.8)

## 2017-07-22 ENCOUNTER — Encounter: Payer: Self-pay | Admitting: Nurse Practitioner

## 2017-07-22 ENCOUNTER — Encounter: Payer: Self-pay | Admitting: Internal Medicine

## 2017-07-22 ENCOUNTER — Ambulatory Visit (INDEPENDENT_AMBULATORY_CARE_PROVIDER_SITE_OTHER): Payer: Medicare Other | Admitting: Nurse Practitioner

## 2017-07-22 ENCOUNTER — Other Ambulatory Visit: Payer: Self-pay

## 2017-07-22 VITALS — BP 140/83 | HR 65 | Temp 97.7°F | Ht 60.0 in | Wt 118.0 lb

## 2017-07-22 DIAGNOSIS — D509 Iron deficiency anemia, unspecified: Secondary | ICD-10-CM | POA: Diagnosis not present

## 2017-07-22 DIAGNOSIS — K449 Diaphragmatic hernia without obstruction or gangrene: Secondary | ICD-10-CM

## 2017-07-22 NOTE — Assessment & Plan Note (Signed)
The patient is a large hiatal hernia with associated Lysbeth Galas lesions as a partial responsibility for her iron deficiency anemia. She is generally asymptomatic related to her hernia. Does have rare dysphagia and regurgitation but this is not overtly bothersome to her. She has been taking her PPI more than every other day as previously recommended. Recommended she take PPI every other day as recommended at her last visit. Return for follow-up in 3 months.

## 2017-07-22 NOTE — Assessment & Plan Note (Signed)
Iron deficiency anemia likely multifactorial nature including [name] lesions with intermittent bleeding as well as decreased iron absorption related to PPI use. She was previously recommended to decrease her Protonix to every other day. She has been taking it more often than that. She is also been missing some of her iron, about 3 days every week she forgets. Recommend she take PPI every other day and iron every day. Her hemoglobin has declined to 10.0 in early July. I recommended repeating her CBC but she is declining for another 3 weeks. I will postdate the CBC for 3 weeks and call with results. Return for follow-up in 3 months.  States she previously saw hematology and did not feel that it was worthwhile and is not interested at this time in hematology referral.

## 2017-07-22 NOTE — Progress Notes (Signed)
Referring Provider: No ref. provider found Primary Care Physician:  System, Provider Not In Primary GI:  Dr. Gala Romney  Chief Complaint  Patient presents with  . Weakness    HPI:   Wanda [name] is a 83 y.o. female who presents for follow-up. The patient was last seen in our office 02/02/2017 for hiatal hernia, GERD, iron deficiency anemia. Noted history of iron deficiency anemia and most recent CBC normal, continues with iron deficiency but Hemoccult negative. Has not noticed any rectal bleeding at her last visit. On Protonix once a day. Known large hiatal hernia and [name] lesions. Previously evaluated by EGD, TCS, capsule study. She was generally asymptomatic from a GI standpoint at her last visit and iron deficiency anemia felt to be a combination of poor iron absorption and intermediate bleeding related to gastric mucosa trauma. No further GI evaluation needed at that time. Discussed risk and benefits of PPI therapy and felt it may be worth decreasing Protonix to every other day to see if this can help her iron absorption. Recommend continue over-the-counter iron, Protonix every other day, CBC in 4 months just prior to office visit.  Last CBC completed 05/18/2017 which found a hemoglobin of 10.0 which is technically within her baseline but a 2 g drop from 6 months prior.  Today she states she's doing well overall. She is taking Protonix more then every other day, but not every day. Still on oral iron pills. Denies hematochezia, melena, abdominal pain, N/V. Has ongoing fatigue but no worse then baseline; states today she "feels the best I've felt in a while." Has rare dysphagia and regurgitation. Denies fever, chills. Has had decline in weight subjectively (unsure how much) because she just doesn't eat as much as she used to. Has lost about 6 lbs in the past 6 months. Denies chest pain, dyspnea, dizziness, lightheadedness, syncope, near syncope. Denies any other upper or lower GI  symptoms.  Past Medical History:  Diagnosis Date  . Cystocele   . Diverticulosis 10/20/10   Left-sided on colonoscopy by Dr. Gala Romney 10/20/10  . Gastric polyps 10/02/2013  . Gastritis 12/08/10   EGD by Dr. Altha Harm hiatal hernia, duodenal diverticulum, chronic gastritis  . GERD (gastroesophageal reflux disease)   . Heart murmur   . Hiatal hernia 10/02/2013  . HTN (hypertension)   . IDA (iron deficiency anemia)   . 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 Osteoarthritis   . Radial fracture    (right) Undergoing treatment by Dr. Aline Brochure currently  . Sleep apnea    STOP BANG SCORE 4  . Urge incontinence   . Vitamin D deficiency     Past Surgical History:  Procedure Laterality Date  . ANTERIOR AND POSTERIOR REPAIR  05/17/2012   Procedure: ANTERIOR (CYSTOCELE) AND POSTERIOR REPAIR (RECTOCELE);  Surgeon: Jonnie Kind, MD;  Location: [facility];  Service: Gynecology;  Laterality: N/A;  . COLONOSCOPY  02/29/2012   Dr. Gala Romney: colonic diverticulosis, minimal internal hemorrhoids, benign polyp  . ESOPHAGOGASTRODUODENOSCOPY  02/29/2012   Dr. Gala Romney: atonic esophagus, moderate-sized hiatal hernia, fundal gland polyps  . ESOPHAGOGASTRODUODENOSCOPY N/A 10/02/2013   Dr. Gala Romney- multiple Wilhemena Durie gastric polyps- not manipulated  . ESOPHAGOGASTRODUODENOSCOPY N/A 01/14/2015   Dr. Gala Romney: Large hiatal hernia, otherwise normal EGD  . GIVENS CAPSULE STUDY  02/29/2012   No explanation for IDA. Possible extrinsic compression but negative CT.   Marland Kitchen SALPINGOOPHORECTOMY  05/17/2012   Procedure: SALPINGO OOPHERECTOMY;  Surgeon: Jonnie Kind, MD;  Location: [facility];  Service:  Gynecology;  Laterality: Bilateral;  . VAGINAL HYSTERECTOMY  05/17/2012   Procedure: HYSTERECTOMY VAGINAL;  Surgeon: Jonnie Kind, MD;  Location: [facility];  Service: Gynecology;  Laterality: N/A;    Current Outpatient Prescriptions  Medication Sig Dispense Refill  . ferrous sulfate 325 (65 FE) MG tablet Take 325 mg by mouth  daily with breakfast.    . lisinopril-hydrochlorothiazide (PRINZIDE,ZESTORETIC) 20-12.5 MG per tablet Take 1 tablet by mouth daily.    . pantoprazole (PROTONIX) 40 MG tablet TAKE 1 TABLET BY MOUTH ONCE DAILY. 90 tablet 3   No current facility-administered medications for this visit.     Allergies as of 07/22/2017 - Review Complete 07/22/2017  Allergen Reaction Noted  . Sulfa antibiotics  06/23/2016  . Naproxen sodium Rash and Other (See Comments)     Family History  Problem Relation Age of Onset  . Hypertension Mother   . Lymphoma Brother   . Anesthesia problems Neg Hx   . Hypotension Neg Hx   . Malignant hyperthermia Neg Hx   . Pseudochol deficiency Neg Hx   . Colon cancer Neg Hx     Social History   Social History  . Marital status: Widowed    Spouse name: N/A  . Number of children: N/A  . Years of education: N/A   Occupational History  . Retired Retired   Social History Main Topics  . Smoking status: Never Smoker  . Smokeless tobacco: Never Used     Comment: Never smoked  . Alcohol use No  . Drug use: No  . Sexual activity: Not Currently    Birth control/ protection: Surgical   Other Topics Concern  . None   Social History Narrative  . None    Review of Systems: General: Negative for anorexia, weight loss, fever, chills, weakness. Eyes: Negative for vision changes.  ENT: Negative for hoarseness, difficulty swallowing. CV: Negative for chest pain, angina, palpitations, peripheral edema.  Respiratory: Negative for dyspnea at rest, cough, sputum, wheezing.  GI: See history of present illness. Endo: Negative for unusual weight change.  Heme: Negative for bruising or bleeding. Allergy: Negative for rash or hives.   Physical Exam: BP 140/83   Pulse 65   Temp 97.7 F (36.5 C) (Oral)   Ht 5' (1.524 m)   Wt 118 lb (53.5 kg)   BMI 23.05 kg/m  General:   Alert and oriented. Pleasant and cooperative. Well-nourished and well-developed.  Eyes:  Without  icterus, sclera clear and conjunctiva pink.  Ears:  Normal auditory acuity. Cardiovascular:  S1, S2 present without murmurs appreciated. Extremities without clubbing or edema. Respiratory:  Clear to auscultation bilaterally. No wheezes, rales, or rhonchi. No distress.  Gastrointestinal:  +BS, soft, non-tender and non-distended. No HSM noted. No guarding or rebound. No masses appreciated.  Rectal:  Deferred  Musculoskalatal:  Symmetrical without gross deformities. Neurologic:  Alert and oriented x4;  grossly normal neurologically. Psych:  Alert and cooperative. Normal mood and affect. Heme/Lymph/Immune: No excessive bruising noted.    07/22/2017 11:03 AM   Disclaimer: This note was dictated with voice recognition software. Similar sounding words can inadvertently be transcribed and may not be corrected upon review.

## 2017-07-22 NOTE — Patient Instructions (Signed)
1. Taking your Protonix every other day. 2. Take your iron pills every day. 3. We will recheck your blood test in 3 weeks. 4. Return for follow-up in 3 months. 5. Call us if you have any questions or concerns.

## 2017-07-26 NOTE — Progress Notes (Signed)
cc'ed to pcp °

## 2017-08-18 LAB — CBC WITH DIFFERENTIAL/PLATELET
Basophils Absolute: 50 cells/uL (ref 0–200)
Basophils Relative: 0.8 %
Eosinophils Absolute: 180 cells/uL (ref 15–500)
Eosinophils Relative: 2.9 %
HCT: 34 % — ABNORMAL LOW (ref 35.0–45.0)
Hemoglobin: 11.3 g/dL — ABNORMAL LOW (ref 11.7–15.5)
Lymphs Abs: 1314 cells/uL (ref 850–3900)
MCH: 29.4 pg (ref 27.0–33.0)
MCHC: 33.2 g/dL (ref 32.0–36.0)
MCV: 88.3 fL (ref 80.0–100.0)
MPV: 10.3 fL (ref 7.5–12.5)
Monocytes Relative: 12.2 %
Neutro Abs: 3900 cells/uL (ref 1500–7800)
Neutrophils Relative %: 62.9 %
Platelets: 299 10*3/uL (ref 140–400)
RBC: 3.85 10*6/uL (ref 3.80–5.10)
RDW: 13.8 % (ref 11.0–15.0)
Total Lymphocyte: 21.2 %
WBC mixed population: 756 cells/uL (ref 200–950)
WBC: 6.2 10*3/uL (ref 3.8–10.8)

## 2017-09-18 ENCOUNTER — Emergency Department (HOSPITAL_COMMUNITY): Payer: Medicare Other

## 2017-09-18 ENCOUNTER — Emergency Department (HOSPITAL_COMMUNITY)
Admission: EM | Admit: 2017-09-18 | Discharge: 2017-09-18 | Disposition: A | Discharge status: Home / Self Care | Payer: Medicare Other | Attending: Emergency Medicine | Admitting: Emergency Medicine

## 2017-09-18 ENCOUNTER — Encounter (HOSPITAL_COMMUNITY): Payer: Self-pay | Admitting: Emergency Medicine

## 2017-09-18 DIAGNOSIS — M26622 Arthralgia of left temporomandibular joint: Secondary | ICD-10-CM | POA: Insufficient documentation

## 2017-09-18 DIAGNOSIS — I10 Essential (primary) hypertension: Secondary | ICD-10-CM | POA: Diagnosis not present

## 2017-09-18 DIAGNOSIS — R6884 Jaw pain: Secondary | ICD-10-CM | POA: Diagnosis present

## 2017-09-18 DIAGNOSIS — Z79899 Other long term (current) drug therapy: Secondary | ICD-10-CM | POA: Insufficient documentation

## 2017-09-18 LAB — BASIC METABOLIC PANEL
Anion gap: 10 (ref 5–15)
BUN: 10 mg/dL (ref 6–20)
CO2: 26 mmol/L (ref 22–32)
Calcium: 9.4 mg/dL (ref 8.9–10.3)
Chloride: 103 mmol/L (ref 101–111)
Creatinine, Ser: 0.57 mg/dL (ref 0.44–1.00)
GFR calc Af Amer: >60 mL/min (ref >60)
GFR calc non Af Amer: >60 mL/min (ref >60)
Glucose, Bld: 96 mg/dL (ref 65–99)
Potassium: 3.5 mmol/L (ref 3.5–5.1)
Sodium: 139 mmol/L (ref 135–145)

## 2017-09-18 LAB — CBC WITH DIFFERENTIAL/PLATELET
Basophils Absolute: 0 10*3/uL (ref 0.0–0.1)
Basophils Relative: 0 %
Eosinophils Absolute: 0.2 10*3/uL (ref 0.0–0.7)
Eosinophils Relative: 2 %
HCT: 36.2 % (ref 36.0–46.0)
Hemoglobin: 11.8 g/dL — ABNORMAL LOW (ref 12.0–15.0)
Lymphocytes Relative: 18 %
Lymphs Abs: 1.9 10*3/uL (ref 0.7–4.0)
MCH: 30.6 pg (ref 26.0–34.0)
MCHC: 32.6 g/dL (ref 30.0–36.0)
MCV: 93.8 fL (ref 78.0–100.0)
Monocytes Absolute: 0.9 10*3/uL (ref 0.1–1.0)
Monocytes Relative: 8 %
Neutro Abs: 7.6 10*3/uL (ref 1.7–7.7)
Neutrophils Relative %: 72 %
Platelets: 318 10*3/uL (ref 150–400)
RBC: 3.86 MIL/uL — ABNORMAL LOW (ref 3.87–5.11)
RDW: 13.9 % (ref 11.5–15.5)
WBC: 10.6 10*3/uL — ABNORMAL HIGH (ref 4.0–10.5)

## 2017-09-18 MED ORDER — HYDROCODONE-ACETAMINOPHEN 5-325 MG PO TABS: 1.0000 | ORAL_TABLET | Freq: Four times a day (QID) | ORAL | 0 refills | Status: DC | PRN

## 2017-09-18 MED ORDER — IOPAMIDOL (ISOVUE-300) INJECTION 61%
75.0000 mL | Freq: Once | INTRAVENOUS | Status: AC | PRN
  Administered 2017-09-18: 75 mL via INTRAVENOUS

## 2017-09-18 NOTE — Discharge Instructions (Signed)
Take the pain medicine as directed.  Follow-up with your dentist as scheduled.  Return for any new or worse symptoms.  CT scan did show significant arthritis in the left jaw temporal bone joint area.

## 2017-09-18 NOTE — ED Triage Notes (Signed)
Pt c/o left jaw pain radiating into head and ear x 2-3 days. Worse with movement. Pt reports it is painful to eat.

## 2017-09-18 NOTE — ED Notes (Signed)
In CT

## 2017-09-18 NOTE — ED Notes (Signed)
Awaiting CT results.

## 2017-09-18 NOTE — ED Notes (Signed)
Pt reports she has been eating raw peanuts, chews on the L side and has a dental appt next week

## 2017-09-18 NOTE — ED Provider Notes (Signed)
[facility] EMERGENCY DEPARTMENT Provider Note   CSN: [id number] Arrival date & time: 09/18/17  8182     History   Chief Complaint Chief Complaint  Patient presents with  . Jaw Pain    HPI Wanda [name] is a 83 y.o. female.  Patient with complaint of pain to the left side of her jaw.  Most of the pain is at the TMJ joint.  This is been present for 2-3 days.  Worse with chewing.  Difficult to open her mouth fully.  But able to bite down okay.  Patient states there is no real tooth pain associated with it.  Patient's had a history of some arthralgias which sound like an arthritis component over the last several months most recent shoulder and prior to that both knees.  Patient is not known to have a history of any specific arthritis condition.  Patient has been followed by the [facility] family practice clinic but they are currently closed.      Past Medical History:  Diagnosis Date  . Cystocele   . Diverticulosis 10/20/10   Left-sided on colonoscopy by Dr. Gala Romney 10/20/10  . Gastric polyps 10/02/2013  . Gastritis 12/08/10   EGD by Dr. Altha Harm hiatal hernia, duodenal diverticulum, chronic gastritis  . GERD (gastroesophageal reflux disease)   . Heart murmur   . Hiatal hernia 10/02/2013  . HTN (hypertension)   . IDA (iron deficiency anemia)   . 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 Osteoarthritis   . Radial fracture    (right) Undergoing treatment by Dr. Aline Brochure currently  . Sleep apnea    STOP BANG SCORE 4  . Urge incontinence   . Vitamin D deficiency     Patient Active Problem List   Diagnosis Date Noted  . Diarrhea, functional 03/15/2015  . Fatigue 12/28/2014  . Upper abdominal pain 12/28/2014  . B12 deficiency 12/28/2014    Class: History of  . Acute blood loss anemia 10/02/2013  . GI bleed 10/02/2013  . Melena 10/02/2013  . Hematemesis 10/02/2013  . Sinus tachycardia 10/02/2013  . 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 Hiatal hernia 10/02/2013   . Gastric polyps 10/02/2013  . HTN (hypertension)   . IDA (iron deficiency anemia)   . Adhesive capsulitis of right shoulder 03/01/2012  . Iron deficiency anemia 02/02/2012  . Distal radius fracture 11/30/2011  . Diverticulosis 10/20/2010  . Normocytic anemia 10/01/2010  . GERD 10/01/2010  . FECAL OCCULT BLOOD 10/01/2010    Past Surgical History:  Procedure Laterality Date  . ABDOMINAL HYSTERECTOMY    . ANTERIOR AND POSTERIOR REPAIR  05/17/2012   Procedure: ANTERIOR (CYSTOCELE) AND POSTERIOR REPAIR (RECTOCELE);  Surgeon: Jonnie Kind, MD;  Location: [facility];  Service: Gynecology;  Laterality: N/A;  . COLONOSCOPY  02/29/2012   Dr. Gala Romney: colonic diverticulosis, minimal internal hemorrhoids, benign polyp  . ESOPHAGOGASTRODUODENOSCOPY  02/29/2012   Dr. Gala Romney: atonic esophagus, moderate-sized hiatal hernia, fundal gland polyps  . ESOPHAGOGASTRODUODENOSCOPY N/A 10/02/2013   Dr. Gala Romney- multiple Wilhemena Durie gastric polyps- not manipulated  . ESOPHAGOGASTRODUODENOSCOPY N/A 01/14/2015   Dr. Gala Romney: Large hiatal hernia, otherwise normal EGD  . GIVENS CAPSULE STUDY  02/29/2012   No explanation for IDA. Possible extrinsic compression but negative CT.   Marland Kitchen SALPINGOOPHORECTOMY  05/17/2012   Procedure: SALPINGO OOPHERECTOMY;  Surgeon: Jonnie Kind, MD;  Location: [facility];  Service: Gynecology;  Laterality: Bilateral;  . VAGINAL HYSTERECTOMY  05/17/2012   Procedure: HYSTERECTOMY VAGINAL;  Surgeon: Jonnie Kind, MD;  Location:  [facility];  Service: Gynecology;  Laterality: N/A;    OB History    No data available       Home Medications    Prior to Admission medications   Medication Sig Start Date End Date Taking? Authorizing Provider  acetaminophen (TYLENOL) 500 MG tablet Take 500 mg by mouth every 6 (six) hours as needed for moderate pain.   Yes [provider]  ferrous sulfate 325 (65 FE) MG tablet Take 325 mg by mouth daily with breakfast.   Yes [provider]    lisinopril-hydrochlorothiazide (PRINZIDE,ZESTORETIC) 20-12.5 MG per tablet Take 1 tablet by mouth daily.   Yes [provider]  pantoprazole (PROTONIX) 40 MG tablet Take 40 mg by mouth every other day.   Yes [provider]  HYDROcodone-acetaminophen (NORCO/VICODIN) 5-325 MG tablet Take 1-2 tablets by mouth every 6 (six) hours as needed. 09/18/17   Fredia Sorrow, MD    Family History Family History  Problem Relation Age of Onset  . Hypertension Mother   . Lymphoma Brother   . Anesthesia problems Neg Hx   . Hypotension Neg Hx   . Malignant hyperthermia Neg Hx   . Pseudochol deficiency Neg Hx   . Colon cancer Neg Hx     Social History Social History  Substance Use Topics  . Smoking status: Never Smoker  . Smokeless tobacco: Never Used     Comment: Never smoked  . Alcohol use No     Allergies   Sulfa antibiotics and Naproxen sodium   Review of Systems Review of Systems  Constitutional: Negative for fever.  HENT: Positive for ear pain. Negative for congestion, dental problem, drooling, facial swelling, hearing loss, sinus pressure and trouble swallowing.   Eyes: Negative for visual disturbance.  Respiratory: Negative for shortness of breath.   Cardiovascular: Negative for chest pain.  Gastrointestinal: Negative for abdominal pain.  Genitourinary: Negative for dysuria.  Musculoskeletal: Positive for arthralgias.  Skin: Negative for rash and wound.  Neurological: Negative for headaches.  Hematological: Does not bruise/bleed easily.  Psychiatric/Behavioral: Negative for confusion.     Physical Exam Updated Vital Signs BP (!) 163/89 (BP Location: Left Arm)   Pulse 71   Temp 98.2 F (36.8 C) (Oral)   Resp 16   Ht 1.575 m (5\' 2" )   Wt 56.7 kg (125 lb)   SpO2 100%   BMI 22.86 kg/m   Physical Exam  Constitutional: She appears well-developed and well-nourished. No distress.  HENT:  Head: Normocephalic and atraumatic.  Left Ear: External ear  normal.  Mouth/Throat: Oropharynx is clear and moist. No oropharyngeal exudate.  Patient with tenderness to palpation at the left TMJ.  No redness or swelling.  Left TM and ear canal normal.  Patient can close her mouth fine.  Some difficulty opening it completely due to pain at the left jaw area.  Teeth without any obvious abnormalities.  No cervical adenopathy.  Eyes: Pupils are equal, round, and reactive to light. Conjunctivae and EOM are normal.  Neck: Normal range of motion. Neck supple. No tracheal deviation present. No thyromegaly present.  Cardiovascular: Normal rate, regular rhythm and normal heart sounds.   Pulmonary/Chest: Effort normal and breath sounds normal. No stridor. No respiratory distress.  Abdominal: Soft. Bowel sounds are normal. There is no tenderness.  Lymphadenopathy:    She has no cervical adenopathy.  Nursing note and vitals reviewed.    ED Treatments / Results  Labs (all labs ordered are listed, but only abnormal results are  displayed) Labs Reviewed  CBC WITH DIFFERENTIAL/PLATELET - Abnormal; Notable for the following:       Result Value   WBC 10.6 (*)    RBC 3.86 (*)    Hemoglobin 11.8 (*)    All other components within normal limits  BASIC METABOLIC PANEL    EKG  EKG Interpretation None       Radiology Ct Soft Tissue Neck W Contrast  Result Date: 09/18/2017 CLINICAL DATA:  83 year old female with left jaw pain radiating to the head in here for 2-3 days. EXAM: CT NECK WITH CONTRAST TECHNIQUE: Multidetector CT imaging of the neck was performed using the standard protocol following the bolus administration of intravenous contrast. CONTRAST:  41mL ISOVUE-300 IOPAMIDOL (ISOVUE-300) INJECTION 61% COMPARISON:  None. FINDINGS: Pharynx and larynx: Laryngeal and pharyngeal soft tissue contours are within normal limits. Negative parapharyngeal and retropharyngeal spaces. Salivary glands: Negative sublingual space, submandibular glands, and right parotid gland.  The left parotid space and left parotid gland are within normal limits. See left masticator space and left TMJ findings below Thyroid: Negative. Lymph nodes: Bilateral cervical lymph nodes are symmetric and within normal limits. No lymphadenopathy. No cystic or necrotic nodes. Vascular: Major vascular structures in the neck and at the skullbase are patent. Limited intracranial: Negative. Visualized orbits: Negative. Mastoids and visualized paranasal sinuses: Clear, including the left tympanic cavity and left mastoid air cells. Skeleton: Degenerated appearance of the left TMJ including subchondral sclerosis, subchondral cysts, and irregularity of the left mandible condyle (series 3, image 13). There are small calcific foci anterior to the condyles tracking into the left masticator space, and soft tissue windows suggests there is some trace fluid density along the anterolateral condyles (series 2, image 14). The left temporal articular fossa also demonstrates subchondral thickening and irregularity. There is no cortical osteolysis, no aggressive bony changes. The mandible is intact. The contralateral right TMJ is normal. There is cervical spine facet and lower disc degeneration. Osteopenia. Mild upper thoracic superior endplate compression is probably chronic. No acute osseous abnormality identified elsewhere. Upper chest: Mild apical lung scarring. Negative visualized superior mediastinum aside from Calcified aortic atherosclerosis. Visible axillary lymph nodes are normal. IMPRESSION: 1. Degenerated left temporomandibular joint with osteoarthritis changes of the mandible condyle, dystrophic calcifications, and possible trace joint effusion. No suspicious or aggressive features. 2. Otherwise negative neck CT. Electronically Signed   By: Genevie Ann M.D.   On: 09/18/2017 13:31    Procedures Procedures (including critical care time)  Medications Ordered in ED Medications  iopamidol (ISOVUE-300) 61 % injection 75 mL  (75 mLs Intravenous Contrast Given 09/18/17 1236)     Initial Impression / Assessment and Plan / ED Course  I have reviewed the triage vital signs and the nursing notes.  Pertinent labs & imaging results that were available during my care of the patient were reviewed by me and considered in my medical decision making (see chart for details).   Patient CT scan shows evidence of significant arthritis in the left TMJ.  This is probably the cause of the pain.  No evidence of any deep space infection or abscess.  Will treat with pain medication and have her follow-up with her dentist.  Patient nontoxic no acute distress.    Final Clinical Impressions(s) / ED Diagnoses   Final diagnoses:  Arthralgia of left temporomandibular joint    New Prescriptions New Prescriptions   HYDROCODONE-ACETAMINOPHEN (NORCO/VICODIN) 5-325 MG TABLET    Take 1-2 tablets by mouth every 6 (six) hours as needed.  Fredia Sorrow, MD 09/18/17 1429

## 2017-09-18 NOTE — ED Notes (Signed)
Call to CT re: time to CT?

## 2017-09-18 NOTE — ED Notes (Signed)
NAD, continues to await CT results

## 2017-09-30 ENCOUNTER — Encounter: Payer: Self-pay | Admitting: Orthopaedic Surgery

## 2017-09-30 ENCOUNTER — Ambulatory Visit (INDEPENDENT_AMBULATORY_CARE_PROVIDER_SITE_OTHER): Payer: Medicare Other | Admitting: Orthopaedic Surgery

## 2017-09-30 ENCOUNTER — Ambulatory Visit (INDEPENDENT_AMBULATORY_CARE_PROVIDER_SITE_OTHER): Payer: Medicare Other

## 2017-09-30 VITALS — BP 163/81 | HR 87 | Temp 98.2°F | Ht 62.0 in | Wt 124.0 lb

## 2017-09-30 DIAGNOSIS — I10 Essential (primary) hypertension: Secondary | ICD-10-CM | POA: Diagnosis not present

## 2017-09-30 DIAGNOSIS — G8929 Other chronic pain: Secondary | ICD-10-CM | POA: Diagnosis not present

## 2017-09-30 DIAGNOSIS — M25561 Pain in right knee: Secondary | ICD-10-CM | POA: Diagnosis not present

## 2017-09-30 NOTE — Progress Notes (Signed)
Subjective:    Patient ID: Wanda [name], female    DOB: 1933-07-29, 83 y.o.   MRN: [id number]  HPI She has had knee pain on the right for several weeks getting worse.  She has pain with the knee chronically and flare ups but recently it has not gone away.  She has swelling and popping and feeling it will not support her and give way.  She has more medial pain.  She was seen in the ER recently for her TMJ problem and was given pain medicine.  That helped her jaw and she took some for her knee and it helped.  Ice, heat and rest have not helped that much.  She has taken Advil which helped a little.      Review of Systems  Respiratory: Negative for cough and shortness of breath.   Cardiovascular: Negative for chest pain and leg swelling.  Endocrine: Positive for cold intolerance.  Musculoskeletal: Positive for arthralgias and gait problem.  Allergic/Immunologic: Positive for environmental allergies.   Past Medical History:  Diagnosis Date  . Cystocele   . Diverticulosis 10/20/10   Left-sided on colonoscopy by Dr. Gala Romney 10/20/10  . Gastric polyps 10/02/2013  . Gastritis 12/08/10   EGD by Dr. Altha Harm hiatal hernia, duodenal diverticulum, chronic gastritis  . GERD (gastroesophageal reflux disease)   . Heart murmur   . Hiatal hernia 10/02/2013  . HTN (hypertension)   . IDA (iron deficiency anemia)   . 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 Osteoarthritis   . Radial fracture    (right) Undergoing treatment by Dr. Aline Brochure currently  . Sleep apnea    STOP BANG SCORE 4  . Urge incontinence   . Vitamin D deficiency     Past Surgical History:  Procedure Laterality Date  . ABDOMINAL HYSTERECTOMY    . ANTERIOR AND POSTERIOR REPAIR  05/17/2012   Procedure: ANTERIOR (CYSTOCELE) AND POSTERIOR REPAIR (RECTOCELE);  Surgeon: Jonnie Kind, MD;  Location: [facility];  Service: Gynecology;  Laterality: N/A;  . COLONOSCOPY  02/29/2012   Dr. Gala Romney: colonic diverticulosis, minimal internal  hemorrhoids, benign polyp  . ESOPHAGOGASTRODUODENOSCOPY  02/29/2012   Dr. Gala Romney: atonic esophagus, moderate-sized hiatal hernia, fundal gland polyps  . ESOPHAGOGASTRODUODENOSCOPY N/A 10/02/2013   Dr. Gala Romney- multiple Wilhemena Durie gastric polyps- not manipulated  . ESOPHAGOGASTRODUODENOSCOPY N/A 01/14/2015   Dr. Gala Romney: Large hiatal hernia, otherwise normal EGD  . GIVENS CAPSULE STUDY  02/29/2012   No explanation for IDA. Possible extrinsic compression but negative CT.   Marland Kitchen SALPINGOOPHORECTOMY  05/17/2012   Procedure: SALPINGO OOPHERECTOMY;  Surgeon: Jonnie Kind, MD;  Location: [facility];  Service: Gynecology;  Laterality: Bilateral;  . VAGINAL HYSTERECTOMY  05/17/2012   Procedure: HYSTERECTOMY VAGINAL;  Surgeon: Jonnie Kind, MD;  Location: [facility];  Service: Gynecology;  Laterality: N/A;    Current Outpatient Medications on File Prior to Visit  Medication Sig Dispense Refill  . acetaminophen (TYLENOL) 500 MG tablet Take 500 mg by mouth every 6 (six) hours as needed for moderate pain.    . ferrous sulfate 325 (65 FE) MG tablet Take 325 mg by mouth daily with breakfast.    . HYDROcodone-acetaminophen (NORCO/VICODIN) 5-325 MG tablet Take 1-2 tablets by mouth every 6 (six) hours as needed. 10 tablet 0  . lisinopril-hydrochlorothiazide (PRINZIDE,ZESTORETIC) 20-12.5 MG per tablet Take 1 tablet by mouth daily.    . pantoprazole (PROTONIX) 40 MG tablet Take 40 mg by mouth every other day.     No current facility-administered  medications on file prior to visit.     Social History   Socioeconomic History  . Marital status: Widowed    Spouse name: Not on file  . Number of children: Not on file  . Years of education: Not on file  . Highest education level: Not on file  Social Needs  . Financial resource strain: Not on file  . Food insecurity - worry: Not on file  . Food insecurity - inability: Not on file  . Transportation needs - medical: Not on file  . Transportation needs -  non-medical: Not on file  Occupational History  . Occupation: Retired    Fish farm manager: RETIRED  Tobacco Use  . Smoking status: Never Smoker  . Smokeless tobacco: Never Used  . Tobacco comment: Never smoked  Substance and Sexual Activity  . Alcohol use: No    Alcohol/week: 0.0 oz  . Drug use: No  . Sexual activity: Not Currently    Birth control/protection: Surgical  Other Topics Concern  . Not on file  Social History Narrative  . Not on file    Family History  Problem Relation Age of Onset  . Hypertension Mother   . Lymphoma Brother   . Anesthesia problems Neg Hx   . Hypotension Neg Hx   . Malignant hyperthermia Neg Hx   . Pseudochol deficiency Neg Hx   . Colon cancer Neg Hx     BP (!) 163/81   Pulse 87   Temp 98.2 F (36.8 C)   Ht 5\' 2"  (1.575 m)   Wt 124 lb (56.2 kg)   BMI 22.68 kg/m      Objective:   Physical Exam  Constitutional: She is oriented to person, place, and time. She appears well-developed and well-nourished.  HENT:  Head: Normocephalic and atraumatic.  Eyes: Conjunctivae and EOM are normal. Pupils are equal, round, and reactive to light.  Neck: Normal range of motion. Neck supple.  Cardiovascular: Normal rate, regular rhythm and intact distal pulses.  Pulmonary/Chest: Effort normal.  Abdominal: Soft.  Musculoskeletal: She exhibits tenderness (Right knee tender, ROM 0 to 100 with pain, more medially.  Positive medial McMurray.  Limp to rightl, effusion 1+, NV intact.  Left knee has crepitus, ROM 0 to 115.).  Neurological: She is alert and oriented to person, place, and time. She displays normal reflexes. No cranial nerve deficit. She exhibits normal muscle tone. Coordination normal.  Skin: Skin is warm and dry.  Psychiatric: She has a normal mood and affect. Her behavior is normal. Judgment and thought content normal.  Vitals reviewed.   X-rays of the right knee were done, reported separately.      Assessment & Plan:   Encounter Diagnoses    Name Primary?  . Chronic pain of right knee Yes  . Essential hypertension    PROCEDURE NOTE:  The patient requests injections of the right knee , verbal consent was obtained.  The right knee was prepped appropriately after time out was performed.   Sterile technique was observed and injection of 1 cc of Depo-Medrol 40 mg with several cc's of plain xylocaine. Anesthesia was provided by ethyl chloride and a 20-gauge needle was used to inject the knee area. The injection was tolerated well.  A band aid dressing was applied.  The patient was advised to apply ice later today and tomorrow to the injection sight as needed.  I have advised her to take Tylenol instead of the Advil as she has history of stomach ulcers.  Return  in three weeks.  She may need MRI if not improved.  Call if any problem.  Precautions discussed.   Electronically Signed Sanjuana Kava, MD 11/15/20182:07 PM

## 2017-10-21 ENCOUNTER — Ambulatory Visit (INDEPENDENT_AMBULATORY_CARE_PROVIDER_SITE_OTHER): Payer: Medicare Other | Admitting: Orthopaedic Surgery

## 2017-10-21 ENCOUNTER — Telehealth: Payer: Self-pay | Admitting: Radiology

## 2017-10-21 ENCOUNTER — Encounter: Payer: Self-pay | Admitting: Orthopaedic Surgery

## 2017-10-21 VITALS — BP 162/81 | HR 72 | Temp 97.0°F | Ht 62.0 in | Wt 120.0 lb

## 2017-10-21 DIAGNOSIS — G8929 Other chronic pain: Secondary | ICD-10-CM

## 2017-10-21 DIAGNOSIS — I10 Essential (primary) hypertension: Secondary | ICD-10-CM | POA: Diagnosis not present

## 2017-10-21 DIAGNOSIS — M25561 Pain in right knee: Secondary | ICD-10-CM | POA: Diagnosis not present

## 2017-10-21 MED ORDER — HYDROCODONE-ACETAMINOPHEN 5-325 MG PO TABS: ORAL_TABLET | ORAL | 0 refills | Status: DC

## 2017-10-21 NOTE — Telephone Encounter (Signed)
Patient called back, I have given her the appointment information.

## 2017-10-21 NOTE — Telephone Encounter (Signed)
called pt left message for her to call me back about MRI and follow up appt  MRI is at [facility] 10/28/17 and follow up with Dr Luna Glasgow is 11/02/17 at 10:20

## 2017-10-21 NOTE — Progress Notes (Signed)
Patient XB:Wanda [name], female DOB:[birth year], 83 y.o. GMW:[id number]  Chief Complaint  Patient presents with  . Knee Pain    right     HPI  DEVITA NIES is a 83 y.o. female who has right knee pain that is worse.  She has more pain, more swelling.  The injection last time did not help.  She has giving way.  She has popping.  Her son accompanies her today.  She cannot take any NSAID. HPI  Body mass index is 21.95 kg/m.  ROS  Review of Systems  Respiratory: Negative for cough and shortness of breath.   Cardiovascular: Negative for chest pain and leg swelling.  Endocrine: Positive for cold intolerance.  Musculoskeletal: Positive for arthralgias and gait problem.  Allergic/Immunologic: Positive for environmental allergies.  All other systems reviewed and are negative.   Past Medical History:  Diagnosis Date  . Cystocele   . Diverticulosis 10/20/10   Left-sided on colonoscopy by Dr. Gala Romney 10/20/10  . Gastric polyps 10/02/2013  . Gastritis 12/08/10   EGD by Dr. Altha Harm hiatal hernia, duodenal diverticulum, chronic gastritis  . GERD (gastroesophageal reflux disease)   . Heart murmur   . Hiatal hernia 10/02/2013  . HTN (hypertension)   . IDA (iron deficiency anemia)   . 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 Osteoarthritis   . Radial fracture    (right) Undergoing treatment by Dr. Aline Brochure currently  . Sleep apnea    STOP BANG SCORE 4  . Urge incontinence   . Vitamin D deficiency     Past Surgical History:  Procedure Laterality Date  . ABDOMINAL HYSTERECTOMY    . ANTERIOR AND POSTERIOR REPAIR  05/17/2012   Procedure: ANTERIOR (CYSTOCELE) AND POSTERIOR REPAIR (RECTOCELE);  Surgeon: Jonnie Kind, MD;  Location: [facility];  Service: Gynecology;  Laterality: N/A;  . COLONOSCOPY  02/29/2012   Dr. Gala Romney: colonic diverticulosis, minimal internal hemorrhoids, benign polyp  . ESOPHAGOGASTRODUODENOSCOPY  02/29/2012   Dr. Gala Romney: atonic esophagus, moderate-sized hiatal  hernia, fundal gland polyps  . ESOPHAGOGASTRODUODENOSCOPY N/A 10/02/2013   Dr. Gala Romney- multiple Wilhemena Durie gastric polyps- not manipulated  . ESOPHAGOGASTRODUODENOSCOPY N/A 01/14/2015   Dr. Gala Romney: Large hiatal hernia, otherwise normal EGD  . GIVENS CAPSULE STUDY  02/29/2012   No explanation for IDA. Possible extrinsic compression but negative CT.   Marland Kitchen SALPINGOOPHORECTOMY  05/17/2012   Procedure: SALPINGO OOPHERECTOMY;  Surgeon: Jonnie Kind, MD;  Location: [facility];  Service: Gynecology;  Laterality: Bilateral;  . VAGINAL HYSTERECTOMY  05/17/2012   Procedure: HYSTERECTOMY VAGINAL;  Surgeon: Jonnie Kind, MD;  Location: [facility];  Service: Gynecology;  Laterality: N/A;    Family History  Problem Relation Age of Onset  . Hypertension Mother   . Lymphoma Brother   . Anesthesia problems Neg Hx   . Hypotension Neg Hx   . Malignant hyperthermia Neg Hx   . Pseudochol deficiency Neg Hx   . Colon cancer Neg Hx     Social History Social History   Tobacco Use  . Smoking status: Never Smoker  . Smokeless tobacco: Never Used  . Tobacco comment: Never smoked  Substance Use Topics  . Alcohol use: No    Alcohol/week: 0.0 oz  . Drug use: No    Allergies  Allergen Reactions  . Sulfa Antibiotics     unknown  . Naproxen Sodium Rash and Other (See Comments)    Passed out    Current Outpatient Medications  Medication Sig Dispense Refill  . acetaminophen (TYLENOL)  500 MG tablet Take 500 mg by mouth every 6 (six) hours as needed for moderate pain.    . ferrous sulfate 325 (65 FE) MG tablet Take 325 mg by mouth daily with breakfast.    . lisinopril-hydrochlorothiazide (PRINZIDE,ZESTORETIC) 20-12.5 MG per tablet Take 1 tablet by mouth daily.    . pantoprazole (PROTONIX) 40 MG tablet Take 40 mg by mouth every other day.    Marland Kitchen HYDROcodone-acetaminophen (NORCO/VICODIN) 5-325 MG tablet One tablet every four hours as needed for acute pain.  Limit of five days per [place] statue. 30 tablet 0    No current facility-administered medications for this visit.      Physical Exam  Blood pressure (!) 162/81, pulse 72, temperature (!) 97 F (36.1 C), height 5\' 2"  (1.575 m), weight 120 lb (54.4 kg).  Constitutional: overall normal hygiene, normal nutrition, well developed, normal grooming, normal body habitus. Assistive device:cane  Musculoskeletal: gait and station Limp right, muscle tone and strength are normal, no tremors or atrophy is present.  .  Neurological: coordination overall normal.  Deep tendon reflex/nerve stretch intact.  Sensation normal.  Cranial nerves II-XII intact.   Skin:   Normal overall no scars, lesions, ulcers or rashes. No psoriasis.  Psychiatric: Alert and oriented x 3.  Recent memory intact, remote memory unclear.  Normal mood and affect. Well groomed.  Good eye contact.  Cardiovascular: overall no swelling, no varicosities, no edema bilaterally, normal temperatures of the legs and arms, no clubbing, cyanosis and good capillary refill.  Lymphatic: palpation is normal.  All other systems reviewed and are negative   The right lower extremity is examined:  Inspection:  Thigh:  Non-tender and no defects  Knee has swelling 1+ effusion.                        Joint tenderness is present                        Patient is tender over the medial joint line  Lower Leg:  Has normal appearance and no tenderness or defects  Ankle:  Non-tender and no defects  Foot:  Non-tender and no defects Range of Motion:  Knee:  Range of motion is: 0-100                        Crepitus is  present  Ankle:  Range of motion is normal. Strength and Tone:  The right lower extremity has normal strength and tone. Stability:  Knee:  The knee has positive medial McMurray  Ankle:  The ankle is stable.   The patient has been educated about the nature of the problem(s) and counseled on treatment options.  The patient appeared to understand what I have discussed and is in  agreement with it.  Encounter Diagnoses  Name Primary?  . Essential hypertension Yes  . Chronic pain of right knee     PLAN Call if any problems.  Precautions discussed.  Continue current medications.   Return to clinic after MRI of the right knee.  I am concerned about a medial meniscus tear.   I have reviewed the [place] web site prior to prescribing narcotic medicine for this patient.  Electronically Signed Sanjuana Kava, MD 12/6/201810:11 AM

## 2017-10-21 NOTE — Patient Instructions (Signed)
..  Your MRI has been ordered.  We will contact your insurance company for approval. After the authorization is received the MRI and a return appointment will be scheduled with you by phone.  If you do not hear from us within 5 business days please call [phone] and ask for the pre-authorization representative in our office.  

## 2017-10-25 ENCOUNTER — Ambulatory Visit: Payer: Medicare Other | Admitting: Nurse Practitioner

## 2017-10-28 ENCOUNTER — Ambulatory Visit (HOSPITAL_COMMUNITY)
Admission: RE | Admit: 2017-10-28 | Discharge: 2017-10-28 | Disposition: A | Discharge status: Home / Self Care | Payer: Medicare Other | Source: Ambulatory Visit | Attending: Orthopaedic Surgery | Admitting: Orthopaedic Surgery

## 2017-10-28 DIAGNOSIS — S8391XA Sprain of unspecified site of right knee, initial encounter: Secondary | ICD-10-CM | POA: Insufficient documentation

## 2017-10-28 DIAGNOSIS — S83241A Other tear of medial meniscus, current injury, right knee, initial encounter: Secondary | ICD-10-CM | POA: Insufficient documentation

## 2017-10-28 DIAGNOSIS — G8929 Other chronic pain: Secondary | ICD-10-CM

## 2017-10-28 DIAGNOSIS — X58XXXA Exposure to other specified factors, initial encounter: Secondary | ICD-10-CM | POA: Insufficient documentation

## 2017-10-28 DIAGNOSIS — M25561 Pain in right knee: Secondary | ICD-10-CM | POA: Diagnosis present

## 2017-10-28 DIAGNOSIS — M25461 Effusion, right knee: Secondary | ICD-10-CM | POA: Diagnosis not present

## 2017-11-02 ENCOUNTER — Ambulatory Visit (INDEPENDENT_AMBULATORY_CARE_PROVIDER_SITE_OTHER): Payer: Medicare Other | Admitting: Orthopaedic Surgery

## 2017-11-02 ENCOUNTER — Encounter: Payer: Self-pay | Admitting: Orthopaedic Surgery

## 2017-11-02 VITALS — BP 150/74 | HR 69 | Temp 97.6°F | Ht 62.0 in | Wt 120.0 lb

## 2017-11-02 DIAGNOSIS — G8929 Other chronic pain: Secondary | ICD-10-CM

## 2017-11-02 DIAGNOSIS — M25561 Pain in right knee: Secondary | ICD-10-CM | POA: Diagnosis not present

## 2017-11-02 NOTE — Progress Notes (Signed)
Patient Wanda [name], female DOB:1933-02-18, 83 y.o. RDE:[id number]  Chief Complaint  Patient presents with  . Results    MRI RIGHT KNEE    HPI  Wanda [name] is a 83 y.o. female who has right knee pain.  She is a little better but still has pain with prolonged walking or standing.  She had MRI of the right knee and it showed: IMPRESSION: 1. Medial femoral condyle marrow edema with subtle subchondral flattening along the weight-bearing portion of the condyle is suspicious for changes of an insufficiency fracture given lack of opposing marrow edema involving the medial tibial plateau nor significant chondral thinning to suggest changes of an osteoarthritis. 2. Free edge horizontal tear of the posterior horn of the medial meniscus. 3. Mucoid degeneration of the PCL. 4. Moderate to large suprapatellar joint effusion with synovial thickening consistent tenosynovitis. 5. Grade 1 sprain of the MPFL and medial collateral ligament.  I have explained the findings to her.  I cannot not call this a fracture but I have told her to continue to use her cane and avoid prolonged standing or walking.  She is agreeable to this.  I would not recommend any surgery at this point.  I will see her in three weeks and get plain X-rays of the knee then. HPI  Body mass index is 21.95 kg/m.  ROS  Review of Systems  Past Medical History:  Diagnosis Date  . Cystocele   . Diverticulosis 10/20/10   Left-sided on colonoscopy by Dr. Gala Romney 10/20/10  . Gastric polyps 10/02/2013  . Gastritis 12/08/10   EGD by Dr. Altha Harm hiatal hernia, duodenal diverticulum, chronic gastritis  . GERD (gastroesophageal reflux disease)   . Heart murmur   . Hiatal hernia 10/02/2013  . HTN (hypertension)   . IDA (iron deficiency anemia)   . 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 Osteoarthritis   . Radial fracture    (right) Undergoing treatment by Dr. Aline Brochure currently  . Sleep apnea    STOP BANG SCORE  4  . Urge incontinence   . Vitamin D deficiency     Past Surgical History:  Procedure Laterality Date  . ABDOMINAL HYSTERECTOMY    . ANTERIOR AND POSTERIOR REPAIR  05/17/2012   Procedure: ANTERIOR (CYSTOCELE) AND POSTERIOR REPAIR (RECTOCELE);  Surgeon: Jonnie Kind, MD;  Location: [facility];  Service: Gynecology;  Laterality: N/A;  . COLONOSCOPY  02/29/2012   Dr. Gala Romney: colonic diverticulosis, minimal internal hemorrhoids, benign polyp  . ESOPHAGOGASTRODUODENOSCOPY  02/29/2012   Dr. Gala Romney: atonic esophagus, moderate-sized hiatal hernia, fundal gland polyps  . ESOPHAGOGASTRODUODENOSCOPY N/A 10/02/2013   Dr. Gala Romney- multiple Wilhemena Durie gastric polyps- not manipulated  . ESOPHAGOGASTRODUODENOSCOPY N/A 01/14/2015   Dr. Gala Romney: Large hiatal hernia, otherwise normal EGD  . GIVENS CAPSULE STUDY  02/29/2012   No explanation for IDA. Possible extrinsic compression but negative CT.   Marland Kitchen SALPINGOOPHORECTOMY  05/17/2012   Procedure: SALPINGO OOPHERECTOMY;  Surgeon: Jonnie Kind, MD;  Location: [facility];  Service: Gynecology;  Laterality: Bilateral;  . VAGINAL HYSTERECTOMY  05/17/2012   Procedure: HYSTERECTOMY VAGINAL;  Surgeon: Jonnie Kind, MD;  Location: [facility];  Service: Gynecology;  Laterality: N/A;    Family History  Problem Relation Age of Onset  . Hypertension Mother   . Lymphoma Brother   . Anesthesia problems Neg Hx   . Hypotension Neg Hx   . Malignant hyperthermia Neg Hx   . Pseudochol deficiency Neg Hx   . Colon cancer Neg Hx  Social History Social History   Tobacco Use  . Smoking status: Never Smoker  . Smokeless tobacco: Never Used  . Tobacco comment: Never smoked  Substance Use Topics  . Alcohol use: No    Alcohol/week: 0.0 oz  . Drug use: No    Allergies  Allergen Reactions  . Sulfa Antibiotics     unknown  . Naproxen Sodium Rash and Other (See Comments)    Passed out    Current Outpatient Medications  Medication Sig Dispense Refill  . acetaminophen  (TYLENOL) 500 MG tablet Take 500 mg by mouth every 6 (six) hours as needed for moderate pain.    . ferrous sulfate 325 (65 FE) MG tablet Take 325 mg by mouth daily with breakfast.    . HYDROcodone-acetaminophen (NORCO/VICODIN) 5-325 MG tablet One tablet every four hours as needed for acute pain.  Limit of five days per [place] statue. 30 tablet 0  . lisinopril-hydrochlorothiazide (PRINZIDE,ZESTORETIC) 20-12.5 MG per tablet Take 1 tablet by mouth daily.    . pantoprazole (PROTONIX) 40 MG tablet Take 40 mg by mouth every other day.     No current facility-administered medications for this visit.      Physical Exam  Blood pressure (!) 150/74, pulse 69, temperature 97.6 F (36.4 C), height 5\' 2"  (1.575 m), weight 120 lb (54.4 kg).  Constitutional: overall normal hygiene, normal nutrition, well developed, normal grooming, normal body habitus. Assistive device:none  Musculoskeletal: gait and station Limp right, muscle tone and strength are normal, no tremors or atrophy is present.  .  Neurological: coordination overall normal.  Deep tendon reflex/nerve stretch intact.  Sensation normal.  Cranial nerves II-XII intact.   Skin:   Normal overall no scars, lesions, ulcers or rashes. No psoriasis.  Psychiatric: Alert and oriented x 3.  Recent memory intact, remote memory unclear.  Normal mood and affect. Well groomed.  Good eye contact.  Cardiovascular: overall no swelling, no varicosities, no edema bilaterally, normal temperatures of the legs and arms, no clubbing, cyanosis and good capillary refill.  Lymphatic: palpation is normal.  All other systems reviewed and are negative   The right lower extremity is examined:  Inspection:  Thigh:  Non-tender and no defects  Knee has swelling 1  effusion.                        Joint tenderness is present                        Patient is tender over the medial joint line  Lower Leg:  Has normal appearance and no tenderness or defects  Ankle:   Non-tender and no defects  Foot:  Non-tender and no defects Range of Motion:  Knee:  Range of motion is: 0-110                        Crepitus is  present  Ankle:  Range of motion is normal. Strength and Tone:  The right lower extremity has normal strength and tone. Stability:  Knee:  The knee is stable.  Ankle:  The ankle is stable.   The patient has been educated about the nature of the problem(s) and counseled on treatment options.  The patient appeared to understand what I have discussed and is in agreement with it.  Encounter Diagnosis  Name Primary?  . Chronic pain of right knee Yes    PLAN Call if  any problems.  Precautions discussed.  Continue current medications.   Return to clinic 3 weeks   X-rays on return.  Electronically Signed Sanjuana Kava, MD 12/18/201810:39 AM

## 2017-11-23 ENCOUNTER — Ambulatory Visit (INDEPENDENT_AMBULATORY_CARE_PROVIDER_SITE_OTHER): Payer: Medicare Other | Admitting: Orthopaedic Surgery

## 2017-11-23 ENCOUNTER — Ambulatory Visit (INDEPENDENT_AMBULATORY_CARE_PROVIDER_SITE_OTHER): Payer: Medicare Other

## 2017-11-23 ENCOUNTER — Encounter: Payer: Self-pay | Admitting: Orthopaedic Surgery

## 2017-11-23 VITALS — BP 126/77 | HR 71 | Ht 62.0 in | Wt 119.0 lb

## 2017-11-23 DIAGNOSIS — G8929 Other chronic pain: Secondary | ICD-10-CM

## 2017-11-23 DIAGNOSIS — M25561 Pain in right knee: Secondary | ICD-10-CM | POA: Diagnosis not present

## 2017-11-23 NOTE — Progress Notes (Signed)
Patient ZO:XWRUEA MONTA [name], female DOB:1933/10/03, 84 y.o. VWU:[id number]  Chief Complaint  Patient presents with  . Follow-up    Knee     HPI  Wanda [name] is a 84 y.o. female who has had right knee pain.  MRI suggested possible insufficiency fracture of the right medial femoral condyle.  She has been off the knee and has less pain but still has discomfort.  She is using a cane.  She has less swelling but cold weather makes it hurt.  She has no new trauma. HPI  Body mass index is 21.77 kg/m.  ROS  Review of Systems  Respiratory: Negative for cough and shortness of breath.   Cardiovascular: Negative for chest pain and leg swelling.  Endocrine: Positive for cold intolerance.  Musculoskeletal: Positive for arthralgias and gait problem.  Allergic/Immunologic: Positive for environmental allergies.  All other systems reviewed and are negative.   Past Medical History:  Diagnosis Date  . Cystocele   . Diverticulosis 10/20/10   Left-sided on colonoscopy by Dr. Gala Romney 10/20/10  . Gastric polyps 10/02/2013  . Gastritis 12/08/10   EGD by Dr. Altha Harm hiatal hernia, duodenal diverticulum, chronic gastritis  . GERD (gastroesophageal reflux disease)   . Heart murmur   . Hiatal hernia 10/02/2013  . HTN (hypertension)   . IDA (iron deficiency anemia)   . 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 Osteoarthritis   . Radial fracture    (right) Undergoing treatment by Dr. Aline Brochure currently  . Sleep apnea    STOP BANG SCORE 4  . Urge incontinence   . Vitamin D deficiency     Past Surgical History:  Procedure Laterality Date  . ABDOMINAL HYSTERECTOMY    . ANTERIOR AND POSTERIOR REPAIR  05/17/2012   Procedure: ANTERIOR (CYSTOCELE) AND POSTERIOR REPAIR (RECTOCELE);  Surgeon: Jonnie Kind, MD;  Location: [facility];  Service: Gynecology;  Laterality: N/A;  . COLONOSCOPY  02/29/2012   Dr. Gala Romney: colonic diverticulosis, minimal internal hemorrhoids, benign polyp  .  ESOPHAGOGASTRODUODENOSCOPY  02/29/2012   Dr. Gala Romney: atonic esophagus, moderate-sized hiatal hernia, fundal gland polyps  . ESOPHAGOGASTRODUODENOSCOPY N/A 10/02/2013   Dr. Gala Romney- multiple Wilhemena Durie gastric polyps- not manipulated  . ESOPHAGOGASTRODUODENOSCOPY N/A 01/14/2015   Dr. Gala Romney: Large hiatal hernia, otherwise normal EGD  . GIVENS CAPSULE STUDY  02/29/2012   No explanation for IDA. Possible extrinsic compression but negative CT.   Marland Kitchen SALPINGOOPHORECTOMY  05/17/2012   Procedure: SALPINGO OOPHERECTOMY;  Surgeon: Jonnie Kind, MD;  Location: [facility];  Service: Gynecology;  Laterality: Bilateral;  . VAGINAL HYSTERECTOMY  05/17/2012   Procedure: HYSTERECTOMY VAGINAL;  Surgeon: Jonnie Kind, MD;  Location: [facility];  Service: Gynecology;  Laterality: N/A;    Family History  Problem Relation Age of Onset  . Hypertension Mother   . Lymphoma Brother   . Anesthesia problems Neg Hx   . Hypotension Neg Hx   . Malignant hyperthermia Neg Hx   . Pseudochol deficiency Neg Hx   . Colon cancer Neg Hx     Social History Social History   Tobacco Use  . Smoking status: Never Smoker  . Smokeless tobacco: Never Used  . Tobacco comment: Never smoked  Substance Use Topics  . Alcohol use: No    Alcohol/week: 0.0 oz  . Drug use: No    Allergies  Allergen Reactions  . Sulfa Antibiotics     unknown  . Naproxen Sodium Rash and Other (See Comments)    Passed out    Current  Outpatient Medications  Medication Sig Dispense Refill  . acetaminophen (TYLENOL) 500 MG tablet Take 500 mg by mouth every 6 (six) hours as needed for moderate pain.    . ferrous sulfate 325 (65 FE) MG tablet Take 325 mg by mouth daily with breakfast.    . HYDROcodone-acetaminophen (NORCO/VICODIN) 5-325 MG tablet One tablet every four hours as needed for acute pain.  Limit of five days per [place]. 30 tablet 0  . lisinopril-hydrochlorothiazide (PRINZIDE,ZESTORETIC) 20-12.5 MG per tablet Take 1 tablet by  mouth daily.    . pantoprazole (PROTONIX) 40 MG tablet Take 40 mg by mouth every other day.     No current facility-administered medications for this visit.      Physical Exam  Blood pressure 126/77, pulse 71, height 5\' 2"  (1.575 m), weight 119 lb (54 kg).  Constitutional: overall normal hygiene, normal nutrition, well developed, normal grooming, normal body habitus. Assistive device:cane  Musculoskeletal: gait and station Limp right, muscle tone and strength are normal, no tremors or atrophy is present.  .  Neurological: coordination overall normal.  Deep tendon reflex/nerve stretch intact.  Sensation normal.  Cranial nerves II-XII intact.   Skin:   Normal overall no scars, lesions, ulcers or rashes. No psoriasis.  Psychiatric: Alert and oriented x 3.  Recent memory intact, remote memory unclear.  Normal mood and affect. Well groomed.  Good eye contact.  Cardiovascular: overall no swelling, no varicosities, no edema bilaterally, normal temperatures of the legs and arms, no clubbing, cyanosis and good capillary refill.  Lymphatic: palpation is normal.  All other systems reviewed and are negative   Right knee has slight effusion, ROM 0 to 105, crepitus but no pain with motion, NV intact, limp to the right.  The patient has been educated about the nature of the problem(s) and counseled on treatment options.  The patient appeared to understand what I have discussed and is in agreement with it.  Encounter Diagnosis  Name Primary?  . Chronic pain of right knee Yes    PLAN Call if any problems.  Precautions discussed.  Continue current medications.   Return to clinic 3 weeks   X-rays on return right knee.  Electronically Signed Sanjuana Kava, MD 1/8/20192:04 PM

## 2017-12-14 ENCOUNTER — Ambulatory Visit (INDEPENDENT_AMBULATORY_CARE_PROVIDER_SITE_OTHER): Payer: Medicare Other | Admitting: Orthopaedic Surgery

## 2017-12-14 ENCOUNTER — Ambulatory Visit (INDEPENDENT_AMBULATORY_CARE_PROVIDER_SITE_OTHER): Payer: Medicare Other

## 2017-12-14 ENCOUNTER — Encounter: Payer: Self-pay | Admitting: Orthopaedic Surgery

## 2017-12-14 VITALS — BP 130/71 | HR 70 | Ht 62.0 in | Wt 121.0 lb

## 2017-12-14 DIAGNOSIS — M25561 Pain in right knee: Secondary | ICD-10-CM

## 2017-12-14 DIAGNOSIS — G8929 Other chronic pain: Secondary | ICD-10-CM

## 2017-12-14 NOTE — Progress Notes (Signed)
Patient OZ:DGUYQI Wanda [name], female DOB:05-17-1933, 84 y.o. HKV:[id number]  Chief Complaint  Patient presents with  . Knee Pain    right knee    HPI  Wanda [name] is a 84 y.o. female who has continued pain of the right knee.  She has less pain overall but still has pain medially.  Prior MRI had shown suggestion of medial femoral insufficiency fracture.  She has been using a cane.  The cold weather and activity makes her knee more painful.  She has no new trauma. HPI  Body mass index is 22.13 kg/m.  ROS  Review of Systems  Respiratory: Negative for cough and shortness of breath.   Cardiovascular: Negative for chest pain and leg swelling.  Endocrine: Positive for cold intolerance.  Musculoskeletal: Positive for arthralgias and gait problem.  Allergic/Immunologic: Positive for environmental allergies.  All other systems reviewed and are negative.   Past Medical History:  Diagnosis Date  . Cystocele   . Diverticulosis 10/20/10   Left-sided on colonoscopy by Dr. Gala Romney 10/20/10  . Gastric polyps 10/02/2013  . Gastritis 12/08/10   EGD by Dr. Altha Harm hiatal hernia, duodenal diverticulum, chronic gastritis  . GERD (gastroesophageal reflux disease)   . Heart murmur   . Hiatal hernia 10/02/2013  . HTN (hypertension)   . IDA (iron deficiency anemia)   . 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 Osteoarthritis   . Radial fracture    (right) Undergoing treatment by Dr. Aline Brochure currently  . Sleep apnea    STOP BANG SCORE 4  . Urge incontinence   . Vitamin D deficiency     Past Surgical History:  Procedure Laterality Date  . ABDOMINAL HYSTERECTOMY    . ANTERIOR AND POSTERIOR REPAIR  05/17/2012   Procedure: ANTERIOR (CYSTOCELE) AND POSTERIOR REPAIR (RECTOCELE);  Surgeon: Jonnie Kind, MD;  Location: [facility];  Service: Gynecology;  Laterality: N/A;  . COLONOSCOPY  02/29/2012   Dr. Gala Romney: colonic diverticulosis, minimal internal hemorrhoids, benign polyp  .  ESOPHAGOGASTRODUODENOSCOPY  02/29/2012   Dr. Gala Romney: atonic esophagus, moderate-sized hiatal hernia, fundal gland polyps  . ESOPHAGOGASTRODUODENOSCOPY N/A 10/02/2013   Dr. Gala Romney- multiple Wilhemena Durie gastric polyps- not manipulated  . ESOPHAGOGASTRODUODENOSCOPY N/A 01/14/2015   Dr. Gala Romney: Large hiatal hernia, otherwise normal EGD  . GIVENS CAPSULE STUDY  02/29/2012   No explanation for IDA. Possible extrinsic compression but negative CT.   Marland Kitchen SALPINGOOPHORECTOMY  05/17/2012   Procedure: SALPINGO OOPHERECTOMY;  Surgeon: Jonnie Kind, MD;  Location: [facility];  Service: Gynecology;  Laterality: Bilateral;  . VAGINAL HYSTERECTOMY  05/17/2012   Procedure: HYSTERECTOMY VAGINAL;  Surgeon: Jonnie Kind, MD;  Location: [facility];  Service: Gynecology;  Laterality: N/A;    Family History  Problem Relation Age of Onset  . Hypertension Mother   . Lymphoma Brother   . Anesthesia problems Neg Hx   . Hypotension Neg Hx   . Malignant hyperthermia Neg Hx   . Pseudochol deficiency Neg Hx   . Colon cancer Neg Hx     Social History Social History   Tobacco Use  . Smoking status: Never Smoker  . Smokeless tobacco: Never Used  . Tobacco comment: Never smoked  Substance Use Topics  . Alcohol use: No    Alcohol/week: 0.0 oz  . Drug use: No    Allergies  Allergen Reactions  . Sulfa Antibiotics     unknown  . Naproxen Sodium Rash and Other (See Comments)    Passed out    Current Outpatient  Medications  Medication Sig Dispense Refill  . acetaminophen (TYLENOL) 500 MG tablet Take 500 mg by mouth every 6 (six) hours as needed for moderate pain.    . ferrous sulfate 325 (65 FE) MG tablet Take 325 mg by mouth daily with breakfast.    . HYDROcodone-acetaminophen (NORCO/VICODIN) 5-325 MG tablet One tablet every four hours as needed for acute pain.  Limit of five days per [place]. 30 tablet 0  . lisinopril-hydrochlorothiazide (PRINZIDE,ZESTORETIC) 20-12.5 MG per tablet Take 1 tablet by  mouth daily.    . pantoprazole (PROTONIX) 40 MG tablet Take 40 mg by mouth every other day.     No current facility-administered medications for this visit.      Physical Exam  Blood pressure 130/71, pulse 70, height 5\' 2"  (1.575 m), weight 121 lb (54.9 kg).  Constitutional: overall normal hygiene, normal nutrition, well developed, normal grooming, normal body habitus. Assistive device:cane  Musculoskeletal: gait and station Limp right, muscle tone and strength are normal, no tremors or atrophy is present.  .  Neurological: coordination overall normal.  Deep tendon reflex/nerve stretch intact.  Sensation normal.  Cranial nerves II-XII intact.   Skin:   Normal overall no scars, lesions, ulcers or rashes. No psoriasis.  Psychiatric: Alert and oriented x 3.  Recent memory intact, remote memory unclear.  Normal mood and affect. Well groomed.  Good eye contact.  Cardiovascular: overall no swelling, no varicosities, no edema bilaterally, normal temperatures of the legs and arms, no clubbing, cyanosis and good capillary refill.  Lymphatic: palpation is normal.  Right knee has nearly full ROM today but still tender medially.  She has some crepitus and NV is intact.  The knee is stable.  All other systems reviewed and are negative   The patient has been educated about the nature of the problem(s) and counseled on treatment options.  The patient appeared to understand what I have discussed and is in agreement with it.  Encounter Diagnosis  Name Primary?  . Chronic pain of right knee Yes   X-rays of the right knee were done, reported separately.  PLAN Call if any problems.  Precautions discussed.  Continue current medications.   Return to clinic 3 weeks   X-rays of the right knee on return.  Electronically Signed Sanjuana Kava, MD 1/29/20192:04 PM

## 2017-12-21 ENCOUNTER — Ambulatory Visit (INDEPENDENT_AMBULATORY_CARE_PROVIDER_SITE_OTHER): Payer: Medicare Other | Admitting: Nurse Practitioner

## 2017-12-21 ENCOUNTER — Encounter: Payer: Self-pay | Admitting: Nurse Practitioner

## 2017-12-21 VITALS — BP 136/79 | HR 64 | Temp 97.3°F | Ht 62.0 in | Wt 121.8 lb

## 2017-12-21 DIAGNOSIS — D509 Iron deficiency anemia, unspecified: Secondary | ICD-10-CM

## 2017-12-21 DIAGNOSIS — K219 Gastro-esophageal reflux disease without esophagitis: Secondary | ICD-10-CM | POA: Diagnosis not present

## 2017-12-21 NOTE — Patient Instructions (Signed)
1. Continue taking your current medications. 2. We will request your labs from your primary care doctor. 3. Return for follow-up in 1 year. 4. Notify us if you see any obvious bleeding in your bowel movements or if your hemoglobin begins to drop again. 5. Call if you have any questions or concerns.

## 2017-12-21 NOTE — Assessment & Plan Note (Signed)
GERD symptoms generally well controlled on every other day Protonix.  She did have one episode of reflux flare after eating a lunch with fried foods and coffee.  No recurrence since.  Continue current medications, follow-up in 1 year.

## 2017-12-21 NOTE — Assessment & Plan Note (Signed)
History of iron deficiency anemia with extensive GI workup.  She is on daily iron.  Her last 4 hemoglobins have persistently trended upward and her last hemoglobin checked by her primary care about 1 month ago was 12.4.  We will request these records from her primary care.  Recommend she continue her current medications including oral iron.  Return for follow-up in 1 year.  Call us if she sees any obvious GI bleeding or if her hemoglobin declines in the future.

## 2017-12-21 NOTE — Progress Notes (Signed)
CC'ED TO PCP 

## 2017-12-21 NOTE — Progress Notes (Signed)
Referring Provider: No ref. provider found Primary Care Physician:  Barry Dienes, NP Primary GI:  Dr. Gala Romney  Chief Complaint  Patient presents with  . ida    f/u.     HPI:   Wanda [name] is a 84 y.o. female who presents for follow-up on iron deficiency anemia.  The patient was last seen in our office 07/22/2017 for the same.  At that time it was noted she has a history of iron deficiency anemia, more recently Hemoccult negative and normal CBC.  Previous workup included EGD, colonoscopy, capsule study.  He is to her last visit her last CBC was completed 05/18/2017 with a hemoglobin of 10.0 which was within her baseline but a 2 g drop from 6 months prior.  At her last visit she was doing well overall, taking Protonix more than every other day but not every day.  Still on oral iron.  No obvious GI bleed.  Ongoing fatigue noted but no worse than baseline and at that time she stated she "feels the best I felt in a while."  Subjective weight loss unsure how much but about 6 pounds objective weight loss in the previous 6 months.  No other GI symptoms.  Recommended continue Protonix, continue iron pills, recheck blood work in 3 weeks, follow-up in 3 months.  CBC ordered at her last visit was completed 08/18/2017 which found hemoglobin improved to 11.3.  Normocytic.  Another CBC was completed 09/18/2017 which found further improvement of hemoglobin to 11.8, still normocytic.  Today she states she's doing well overall. About a month ago her PCP did CBC and her hgb was 12.4. Had an episode of epigastric pain after eating fried foods for lunch and hasn't had any recurrence. Otherwise GERD well managed on PPI. Denies abdominal pain, N/V, hematochezia, melena. Energy is baseline. Appetite is good. Still on iron. Denies chest pain, dyspnea, dizziness, lightheadedness, syncope, near syncope. Denies any other upper or lower GI symptoms.  Past Medical History:  Diagnosis Date  . Cystocele   . Diverticulosis  10/20/10   Left-sided on colonoscopy by Dr. Gala Romney 10/20/10  . Gastric polyps 10/02/2013  . Gastritis 12/08/10   EGD by Dr. Altha Harm hiatal hernia, duodenal diverticulum, chronic gastritis  . GERD (gastroesophageal reflux disease)   . Heart murmur   . Hiatal hernia 10/02/2013  . HTN (hypertension)   . IDA (iron deficiency anemia)   . 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 Osteoarthritis   . Radial fracture    (right) Undergoing treatment by Dr. Aline Brochure currently  . Sleep apnea    STOP BANG SCORE 4  . Urge incontinence   . Vitamin D deficiency     Past Surgical History:  Procedure Laterality Date  . ABDOMINAL HYSTERECTOMY    . ANTERIOR AND POSTERIOR REPAIR  05/17/2012   Procedure: ANTERIOR (CYSTOCELE) AND POSTERIOR REPAIR (RECTOCELE);  Surgeon: Jonnie Kind, MD;  Location: [facility];  Service: Gynecology;  Laterality: N/A;  . COLONOSCOPY  02/29/2012   Dr. Gala Romney: colonic diverticulosis, minimal internal hemorrhoids, benign polyp  . ESOPHAGOGASTRODUODENOSCOPY  02/29/2012   Dr. Gala Romney: atonic esophagus, moderate-sized hiatal hernia, fundal gland polyps  . ESOPHAGOGASTRODUODENOSCOPY N/A 10/02/2013   Dr. Gala Romney- multiple Wilhemena Durie gastric polyps- not manipulated  . ESOPHAGOGASTRODUODENOSCOPY N/A 01/14/2015   Dr. Gala Romney: Large hiatal hernia, otherwise normal EGD  . GIVENS CAPSULE STUDY  02/29/2012   No explanation for IDA. Possible extrinsic compression but negative CT.   Marland Kitchen SALPINGOOPHORECTOMY  05/17/2012   Procedure:  SALPINGO OOPHERECTOMY;  Surgeon: Jonnie Kind, MD;  Location: [facility];  Service: Gynecology;  Laterality: Bilateral;  . VAGINAL HYSTERECTOMY  05/17/2012   Procedure: HYSTERECTOMY VAGINAL;  Surgeon: Jonnie Kind, MD;  Location: [facility];  Service: Gynecology;  Laterality: N/A;    Current Outpatient Medications  Medication Sig Dispense Refill  . acetaminophen (TYLENOL) 500 MG tablet Take 500 mg by mouth every 6 (six) hours as needed for moderate pain.    Marland Kitchen  CALCIUM PO Take by mouth daily.    . ferrous sulfate 325 (65 FE) MG tablet Take 325 mg by mouth daily with breakfast.    . HYDROcodone-acetaminophen (NORCO/VICODIN) 5-325 MG tablet One tablet every four hours as needed for acute pain.  Limit of five days per  statue. 30 tablet 0  . lisinopril-hydrochlorothiazide (PRINZIDE,ZESTORETIC) 20-12.5 MG per tablet Take 1 tablet by mouth daily.    . pantoprazole (PROTONIX) 40 MG tablet Take 40 mg by mouth every other day.     No current facility-administered medications for this visit.     Allergies as of 12/21/2017 - Review Complete 12/21/2017  Allergen Reaction Noted  . Sulfa antibiotics  06/23/2016  . Naproxen sodium Rash and Other (See Comments)     Family History  Problem Relation Age of Onset  . Hypertension Mother   . Lymphoma Brother   . Anesthesia problems Neg Hx   . Hypotension Neg Hx   . Malignant hyperthermia Neg Hx   . Pseudochol deficiency Neg Hx   . Colon cancer Neg Hx     Social History   Socioeconomic History  . Marital status: Widowed    Spouse name: None  . Number of children: None  . Years of education: None  . Highest education level: None  Social Needs  . Financial resource strain: None  . Food insecurity - worry: None  . Food insecurity - inability: None  . Transportation needs - medical: None  . Transportation needs - non-medical: None  Occupational History  . Occupation: Retired    Fish farm manager: RETIRED  Tobacco Use  . Smoking status: Never Smoker  . Smokeless tobacco: Never Used  . Tobacco comment: Never smoked  Substance and Sexual Activity  . Alcohol use: No    Alcohol/week: 0.0 oz  . Drug use: No  . Sexual activity: Not Currently    Birth control/protection: Surgical  Other Topics Concern  . None  Social History Narrative  . None    Review of Systems: General: Negative for anorexia, weight loss, fever, chills, fatigue, weakness. ENT: Negative for hoarseness, difficulty swallowing ,  nasal congestion. CV: Negative for chest pain, angina, palpitations, dyspnea on exertion, peripheral edema.  Respiratory: Negative for dyspnea at rest, dyspnea on exertion, cough, sputum, wheezing.  GI: See history of present illness. Endo: Negative for unusual weight change.  Heme: Negative for bruising or bleeding.  Physical Exam: BP 136/79   Pulse 64   Temp (!) 97.3 F (36.3 C) (Oral)   Ht 5\' 2"  (1.575 m)   Wt 121 lb 12.8 oz (55.2 kg)   BMI 22.28 kg/m  General:   Alert and oriented. Pleasant and cooperative. Well-nourished and well-developed.  Eyes:  Without icterus, sclera clear and conjunctiva pink.  Ears:  Normal auditory acuity. Cardiovascular:  S1, S2 present with grade 2/6 systolic blowing murmur appreciated. Extremities without clubbing or edema. Respiratory:  Clear to auscultation bilaterally. No wheezes, rales, or rhonchi. No distress.  Gastrointestinal:  +BS, soft, non-tender and non-distended. No  HSM noted. No guarding or rebound. No masses appreciated.  Rectal:  Deferred  Musculoskalatal:  Symmetrical without gross deformities. Neurologic:  Alert and oriented x4;  grossly normal neurologically. Psych:  Alert and cooperative. Normal mood and affect. Heme/Lymph/Immune: No excessive bruising noted.    12/21/2017 11:08 AM   Disclaimer: This note was dictated with voice recognition software. Similar sounding words can inadvertently be transcribed and may not be corrected upon review.

## 2018-01-05 ENCOUNTER — Ambulatory Visit: Payer: Medicare Other | Admitting: Orthopaedic Surgery

## 2018-01-06 NOTE — Progress Notes (Signed)
Labs received from primary care provider collected 11/25/2017.  Hemoglobin at that time 12.3, hematocrit 39.9.  RBC indices normal.  Platelets 322.  Records marked for scanning into our system.

## 2018-06-23 ENCOUNTER — Ambulatory Visit (INDEPENDENT_AMBULATORY_CARE_PROVIDER_SITE_OTHER): Payer: Medicare Other | Admitting: Orthopaedic Surgery

## 2018-06-23 ENCOUNTER — Ambulatory Visit (INDEPENDENT_AMBULATORY_CARE_PROVIDER_SITE_OTHER): Payer: Medicare Other

## 2018-06-23 ENCOUNTER — Encounter: Payer: Self-pay | Admitting: Orthopaedic Surgery

## 2018-06-23 VITALS — BP 145/97 | HR 77 | Temp 97.1°F | Ht 62.0 in | Wt 119.0 lb

## 2018-06-23 DIAGNOSIS — M25561 Pain in right knee: Secondary | ICD-10-CM

## 2018-06-23 DIAGNOSIS — G8929 Other chronic pain: Secondary | ICD-10-CM

## 2018-06-23 NOTE — Progress Notes (Signed)
Patient GH:WEXHBZ Alain Honey, female DOB:10/03/1933, 84 y.o. JIR:[id number]  Chief Complaint  Patient presents with  . Knee Pain    right     HPI  [place] PAONE is a 84 y.o. female who woke up August 2 with severe right knee pain.  She could not raise her leg up and get out of bed.  She had to call family to help her.  She had knee pain through the weekend.  She called for appointment her Monday August 5.  On Tuesday she felt better and yesterday she had no pain, no swelling.  She is back to normal.  She is walking well now.  I had seen her in late January and we were concerned about a possible medial femoral condyle insufficiency fracture.  She did not return.  She was concerned the fracture occurred this time.  But she has no pain now, no problem.  X-rays were done here today and I see no fracture.  I have told her this.   Body mass index is 21.77 kg/m.  ROS  Review of Systems  Constitutional: Positive for activity change.  Respiratory: Negative for cough and shortness of breath.   Cardiovascular: Negative for chest pain and leg swelling.  Endocrine: Positive for cold intolerance.  Musculoskeletal: Positive for arthralgias and gait problem.  Allergic/Immunologic: Positive for environmental allergies.  All other systems reviewed and are negative.   All other systems reviewed and are negative.  Past Medical History:  Diagnosis Date  . Cystocele   . Diverticulosis 10/20/10   Left-sided on colonoscopy by Dr. Gala Romney 10/20/10  . Gastric polyps 10/02/2013  . Gastritis 12/08/10   EGD by Dr. Altha Harm hiatal hernia, duodenal diverticulum, chronic gastritis  . GERD (gastroesophageal reflux disease)   . Heart murmur   . Hiatal hernia 10/02/2013  . HTN (hypertension)   . IDA (iron deficiency anemia)   . 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 Osteoarthritis   . Radial fracture    (right) Undergoing treatment by Dr. Aline Brochure currently  . Sleep apnea    STOP BANG SCORE 4   . Urge incontinence   . Vitamin D deficiency     Past Surgical History:  Procedure Laterality Date  . ABDOMINAL HYSTERECTOMY    . ANTERIOR AND POSTERIOR REPAIR  05/17/2012   Procedure: ANTERIOR (CYSTOCELE) AND POSTERIOR REPAIR (RECTOCELE);  Surgeon: Jonnie Kind, MD;  Location: [facility];  Service: Gynecology;  Laterality: N/A;  . COLONOSCOPY  02/29/2012   Dr. Gala Romney: colonic diverticulosis, minimal internal hemorrhoids, benign polyp  . ESOPHAGOGASTRODUODENOSCOPY  02/29/2012   Dr. Gala Romney: atonic esophagus, moderate-sized hiatal hernia, fundal gland polyps  . ESOPHAGOGASTRODUODENOSCOPY N/A 10/02/2013   Dr. Gala Romney- multiple Wilhemena Durie gastric polyps- not manipulated  . ESOPHAGOGASTRODUODENOSCOPY N/A 01/14/2015   Dr. Gala Romney: Large hiatal hernia, otherwise normal EGD  . GIVENS CAPSULE STUDY  02/29/2012   No explanation for IDA. Possible extrinsic compression but negative CT.   Marland Kitchen SALPINGOOPHORECTOMY  05/17/2012   Procedure: SALPINGO OOPHERECTOMY;  Surgeon: Jonnie Kind, MD;  Location: [facility];  Service: Gynecology;  Laterality: Bilateral;  . VAGINAL HYSTERECTOMY  05/17/2012   Procedure: HYSTERECTOMY VAGINAL;  Surgeon: Jonnie Kind, MD;  Location: [facility];  Service: Gynecology;  Laterality: N/A;    Family History  Problem Relation Age of Onset  . Hypertension Mother   . Lymphoma Brother   . Anesthesia problems Neg Hx   . Hypotension Neg Hx   . Malignant hyperthermia Neg Hx   . Pseudochol  deficiency Neg Hx   . Colon cancer Neg Hx     Social History Social History   Tobacco Use  . Smoking status: Never Smoker  . Smokeless tobacco: Never Used  . Tobacco comment: Never smoked  Substance Use Topics  . Alcohol use: No    Alcohol/week: 0.0 standard drinks  . Drug use: No    Allergies  Allergen Reactions  . Sulfa Antibiotics     unknown  . Naproxen Sodium Rash and Other (See Comments)    Passed out    Current Outpatient Medications  Medication Sig Dispense Refill  .  acetaminophen (TYLENOL) 500 MG tablet Take 500 mg by mouth every 6 (six) hours as needed for moderate pain.    Marland Kitchen CALCIUM PO Take by mouth daily.    . ferrous sulfate 325 (65 FE) MG tablet Take 325 mg by mouth daily with breakfast.    . lisinopril-hydrochlorothiazide (PRINZIDE,ZESTORETIC) 20-12.5 MG per tablet Take 1 tablet by mouth daily.    . pantoprazole (PROTONIX) 40 MG tablet Take 40 mg by mouth every other day.    Marland Kitchen HYDROcodone-acetaminophen (NORCO/VICODIN) 5-325 MG tablet One tablet every four hours as needed for acute pain.  Limit of five days per [place] statue. (Patient not taking: Reported on 06/23/2018) 30 tablet 0   No current facility-administered medications for this visit.      Physical Exam  Blood pressure (!) 145/97, pulse 77, temperature (!) 97.1 F (36.2 C), height 5\' 2"  (1.575 m), weight 119 lb (54 kg).  Constitutional: overall normal hygiene, normal nutrition, well developed, normal grooming, normal body habitus. Assistive device:none  Musculoskeletal: gait and station Limp none, muscle tone and strength are normal, no tremors or atrophy is present.  .  Neurological: coordination overall normal.  Deep tendon reflex/nerve stretch intact.  Sensation normal.  Cranial nerves II-XII intact.   Skin:   Normal overall no scars, lesions, ulcers or rashes. No psoriasis.  Psychiatric: Alert and oriented x 3.  Recent memory intact, remote memory unclear.  Normal mood and affect. Well groomed.  Good eye contact.  Cardiovascular: overall no swelling, no varicosities, no edema bilaterally, normal temperatures of the legs and arms, no clubbing, cyanosis and good capillary refill.  Lymphatic: palpation is normal.  The right knee has full ROM, no pain, no effusion, normal NV status, normal gait.  Normal exam.  X-rays were done of the right knee, reported separately.  All other systems reviewed and are negative   The patient has been educated about the nature of the problem(s)  and counseled on treatment options.  The patient appeared to understand what I have discussed and is in agreement with it.  Encounter Diagnosis  Name Primary?  . Chronic pain of right knee Yes    PLAN Call if any problems.  Precautions discussed.  Continue current medications.   Return to clinic as needed.   Electronically Signed Sanjuana Kava, MD 8/8/20199:29 AM

## 2018-09-15 ENCOUNTER — Telehealth: Payer: Self-pay | Admitting: Orthopaedic Surgery

## 2018-09-15 NOTE — Telephone Encounter (Signed)
Call received from patient 9:40am today, 09/15/18; states had a fall on Tuesday, 09/13/18, and now right knee is hurting. Asked if any available appointments for today. Per Dr Luna Glasgow, can see patient on next clinic day, Tuesday, 09/20/18. Patient said will call back by Monday if needs to schedule for Tuesday.

## 2018-09-16 ENCOUNTER — Other Ambulatory Visit: Payer: Self-pay | Admitting: Gastroenterology

## 2018-11-04 ENCOUNTER — Emergency Department (HOSPITAL_COMMUNITY): Payer: Medicare Other

## 2018-11-04 ENCOUNTER — Observation Stay (HOSPITAL_COMMUNITY)
Admission: EM | Admit: 2018-11-04 | Discharge: 2018-11-05 | Disposition: A | Discharge status: Home / Self Care | Payer: Medicare Other | Attending: Internal Medicine | Admitting: Internal Medicine

## 2018-11-04 ENCOUNTER — Encounter (HOSPITAL_COMMUNITY): Payer: Self-pay | Admitting: *Deleted

## 2018-11-04 ENCOUNTER — Other Ambulatory Visit: Payer: Self-pay

## 2018-11-04 DIAGNOSIS — D509 Iron deficiency anemia, unspecified: Secondary | ICD-10-CM | POA: Diagnosis not present

## 2018-11-04 DIAGNOSIS — M19032 Primary osteoarthritis, left wrist: Secondary | ICD-10-CM | POA: Diagnosis not present

## 2018-11-04 DIAGNOSIS — Z79899 Other long term (current) drug therapy: Secondary | ICD-10-CM | POA: Insufficient documentation

## 2018-11-04 DIAGNOSIS — I10 Essential (primary) hypertension: Secondary | ICD-10-CM | POA: Diagnosis present

## 2018-11-04 DIAGNOSIS — K219 Gastro-esophageal reflux disease without esophagitis: Secondary | ICD-10-CM | POA: Diagnosis not present

## 2018-11-04 DIAGNOSIS — G4730 Sleep apnea, unspecified: Secondary | ICD-10-CM | POA: Insufficient documentation

## 2018-11-04 DIAGNOSIS — K5730 Diverticulosis of large intestine without perforation or abscess without bleeding: Secondary | ICD-10-CM | POA: Diagnosis not present

## 2018-11-04 DIAGNOSIS — R5383 Other fatigue: Secondary | ICD-10-CM | POA: Diagnosis present

## 2018-11-04 DIAGNOSIS — R531 Weakness: Secondary | ICD-10-CM | POA: Diagnosis present

## 2018-11-04 DIAGNOSIS — D649 Anemia, unspecified: Secondary | ICD-10-CM | POA: Diagnosis present

## 2018-11-04 DIAGNOSIS — Z9119 Patient's noncompliance with other medical treatment and regimen: Secondary | ICD-10-CM | POA: Diagnosis not present

## 2018-11-04 DIAGNOSIS — R799 Abnormal finding of blood chemistry, unspecified: Secondary | ICD-10-CM | POA: Diagnosis present

## 2018-11-04 LAB — CBC WITH DIFFERENTIAL/PLATELET
Abs Immature Granulocytes: 0.03 10*3/uL (ref 0.00–0.07)
Basophils Absolute: 0.1 10*3/uL (ref 0.0–0.1)
Basophils Relative: 1 %
Eosinophils Absolute: 0.1 10*3/uL (ref 0.0–0.5)
Eosinophils Relative: 1 %
HCT: 21.4 % — ABNORMAL LOW (ref 36.0–46.0)
Hemoglobin: 5.9 g/dL — CL (ref 12.0–15.0)
Immature Granulocytes: 0 %
Lymphocytes Relative: 10 %
Lymphs Abs: 1 10*3/uL (ref 0.7–4.0)
MCH: 24 pg — ABNORMAL LOW (ref 26.0–34.0)
MCHC: 27.6 g/dL — ABNORMAL LOW (ref 30.0–36.0)
MCV: 87 fL (ref 80.0–100.0)
Monocytes Absolute: 0.9 10*3/uL (ref 0.1–1.0)
Monocytes Relative: 9 %
Neutro Abs: 7.8 10*3/uL — ABNORMAL HIGH (ref 1.7–7.7)
Neutrophils Relative %: 79 %
Platelets: 440 10*3/uL — ABNORMAL HIGH (ref 150–400)
RBC: 2.46 MIL/uL — ABNORMAL LOW (ref 3.87–5.11)
RDW: 16.1 % — ABNORMAL HIGH (ref 11.5–15.5)
WBC: 9.8 10*3/uL (ref 4.0–10.5)
nRBC: 0 % (ref 0.0–0.2)

## 2018-11-04 LAB — COMPREHENSIVE METABOLIC PANEL
ALT: 13 U/L (ref 0–44)
AST: 20 U/L (ref 15–41)
Albumin: 3.8 g/dL (ref 3.5–5.0)
Alkaline Phosphatase: 88 U/L (ref 38–126)
Anion gap: 7 (ref 5–15)
BUN: 14 mg/dL (ref 8–23)
CO2: 22 mmol/L (ref 22–32)
Calcium: 8.5 mg/dL — ABNORMAL LOW (ref 8.9–10.3)
Chloride: 106 mmol/L (ref 98–111)
Creatinine, Ser: 0.69 mg/dL (ref 0.44–1.00)
GFR calc Af Amer: >60 mL/min (ref >60)
GFR calc non Af Amer: >60 mL/min (ref >60)
Glucose, Bld: 112 mg/dL — ABNORMAL HIGH (ref 70–99)
Potassium: 3.4 mmol/L — ABNORMAL LOW (ref 3.5–5.1)
Sodium: 135 mmol/L (ref 135–145)
Total Bilirubin: 0.9 mg/dL (ref 0.3–1.2)
Total Protein: 7.1 g/dL (ref 6.5–8.1)

## 2018-11-04 LAB — PREPARE RBC (CROSSMATCH)

## 2018-11-04 LAB — RETICULOCYTES
Immature Retic Fract: 26.1 % — ABNORMAL HIGH (ref 2.3–15.9)
RBC.: 2.46 MIL/uL — ABNORMAL LOW (ref 3.87–5.11)
Retic Count, Absolute: 64.7 10*3/uL (ref 19.0–186.0)
Retic Ct Pct: 2.6 % (ref 0.4–3.1)

## 2018-11-04 MED ORDER — ACETAMINOPHEN 325 MG PO TABS: 650.0000 mg | ORAL_TABLET | Freq: Four times a day (QID) | ORAL | Status: DC | PRN

## 2018-11-04 MED ORDER — LISINOPRIL-HYDROCHLOROTHIAZIDE 20-12.5 MG PO TABS: 1.0000 | ORAL_TABLET | Freq: Every day | ORAL | Status: DC

## 2018-11-04 MED ORDER — SODIUM CHLORIDE 0.9% FLUSH: 3.0000 mL | INTRAVENOUS | Status: DC | PRN

## 2018-11-04 MED ORDER — SODIUM CHLORIDE 0.9 % IV SOLN: 250.0000 mL | INTRAVENOUS | Status: DC | PRN

## 2018-11-04 MED ORDER — ACETAMINOPHEN 650 MG RE SUPP: 650.0000 mg | Freq: Four times a day (QID) | RECTAL | Status: DC | PRN

## 2018-11-04 MED ORDER — PANTOPRAZOLE SODIUM 40 MG PO TBEC
40.0000 mg | DELAYED_RELEASE_TABLET | Freq: Two times a day (BID) | ORAL | Status: DC
  Administered 2018-11-05: 40 mg via ORAL
  Filled 2018-11-04: qty 1

## 2018-11-04 MED ORDER — POTASSIUM CHLORIDE CRYS ER 20 MEQ PO TBCR
40.0000 meq | EXTENDED_RELEASE_TABLET | Freq: Once | ORAL | Status: AC
  Administered 2018-11-04: 40 meq via ORAL
  Filled 2018-11-04: qty 2

## 2018-11-04 MED ORDER — SODIUM CHLORIDE 0.9% IV SOLUTION
Freq: Once | INTRAVENOUS | Status: AC
  Administered 2018-11-04: 10 mL via INTRAVENOUS

## 2018-11-04 MED ORDER — SODIUM CHLORIDE 0.9 % IV SOLN: 10.0000 mL/h | Freq: Once | INTRAVENOUS | Status: DC

## 2018-11-04 MED ORDER — SODIUM CHLORIDE 0.9% FLUSH
3.0000 mL | Freq: Two times a day (BID) | INTRAVENOUS | Status: DC
  Administered 2018-11-05: 3 mL via INTRAVENOUS

## 2018-11-04 MED ORDER — FERROUS SULFATE 325 (65 FE) MG PO TABS
325.0000 mg | ORAL_TABLET | Freq: Every day | ORAL | Status: DC
  Administered 2018-11-05: 325 mg via ORAL
  Filled 2018-11-04 (×3): qty 1

## 2018-11-04 NOTE — ED Notes (Signed)
ED Provider at bedside. 

## 2018-11-04 NOTE — ED Notes (Signed)
When asked if pt has been taking her HTN meds, pt states, "I haven't been doing so good with my medicines"

## 2018-11-04 NOTE — ED Triage Notes (Signed)
Pt went and had blood work today at her PCP and they notified her she needed to come to the hospital because her Hgb was low; pt states she has been feeling bad for the last 2 days and pt states she has been urinating more frequently that she usually does; pt fell today after tripping over dog pan and is c/o pain to her left hip and left wrist

## 2018-11-04 NOTE — ED Notes (Signed)
Patient transported to X-ray 

## 2018-11-04 NOTE — ED Provider Notes (Signed)
[facility] EMERGENCY DEPARTMENT Provider Note   CSN: [id number] Arrival date & time: 11/04/18  2203     History   Chief Complaint Chief Complaint  Patient presents with  . Generalized Body Aches    HPI Wanda [name] is a 84 y.o. female.  The history is provided by the patient.  She has history of hypertension, GERD, diverticulosis and was sent to the emergency department because of low hemoglobin.  She relates that for the last 2 days, she has been very tired and weak and has been staying in bed.  She went to her PCP where blood work was done and she was told her hemoglobin was low and she should come to the hospital.  She denies abdominal pain.  She denies black stools or dark stools except when she takes her iron tablets.  She denies NSAID use.  She denies chest pain, heaviness, tightness, pressure.  There is some exertional dyspnea.  Today, she did trip over her dog and fell injuring her left wrist and left hip.  Pain has been intermittent.  She has been ambulatory.  She also had a fall several weeks ago where she hit her head and had bilateral black eyes.  Past Medical History:  Diagnosis Date  . Cystocele   . Diverticulosis 10/20/10   Left-sided on colonoscopy by Dr. Gala Romney 10/20/10  . Gastric polyps 10/02/2013  . Gastritis 12/08/10   EGD by Dr. Altha Harm hiatal hernia, duodenal diverticulum, chronic gastritis  . GERD (gastroesophageal reflux disease)   . Heart murmur   . Hiatal hernia 10/02/2013  . HTN (hypertension)   . IDA (iron deficiency anemia)   . 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 Osteoarthritis   . Radial fracture    (right) Undergoing treatment by Dr. Aline Brochure currently  . Sleep apnea    STOP BANG SCORE 4  . Urge incontinence   . Vitamin D deficiency     Patient Active Problem List   Diagnosis Date Noted  . Diarrhea, functional 03/15/2015  . Fatigue 12/28/2014  . Upper abdominal pain 12/28/2014  . B12 deficiency 12/28/2014    Class:  History of  . Acute blood loss anemia 10/02/2013  . GI bleed 10/02/2013  . Melena 10/02/2013  . Hematemesis 10/02/2013  . Sinus tachycardia 10/02/2013  . 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 Hiatal hernia 10/02/2013  . Gastric polyps 10/02/2013  . HTN (hypertension)   . IDA (iron deficiency anemia)   . Adhesive capsulitis of right shoulder 03/01/2012  . Iron deficiency anemia 02/02/2012  . Distal radius fracture 11/30/2011  . Diverticulosis 10/20/2010  . Normocytic anemia 10/01/2010  . GERD 10/01/2010  . FECAL OCCULT BLOOD 10/01/2010    Past Surgical History:  Procedure Laterality Date  . ABDOMINAL HYSTERECTOMY    . ANTERIOR AND POSTERIOR REPAIR  05/17/2012   Procedure: ANTERIOR (CYSTOCELE) AND POSTERIOR REPAIR (RECTOCELE);  Surgeon: Jonnie Kind, MD;  Location: [facility];  Service: Gynecology;  Laterality: N/A;  . COLONOSCOPY  02/29/2012   Dr. Gala Romney: colonic diverticulosis, minimal internal hemorrhoids, benign polyp  . ESOPHAGOGASTRODUODENOSCOPY  02/29/2012   Dr. Gala Romney: atonic esophagus, moderate-sized hiatal hernia, fundal gland polyps  . ESOPHAGOGASTRODUODENOSCOPY N/A 10/02/2013   Dr. Gala Romney- multiple Wilhemena Durie gastric polyps- not manipulated  . ESOPHAGOGASTRODUODENOSCOPY N/A 01/14/2015   Dr. Gala Romney: Large hiatal hernia, otherwise normal EGD  . GIVENS CAPSULE STUDY  02/29/2012   No explanation for IDA. Possible extrinsic compression but negative CT.   Marland Kitchen SALPINGOOPHORECTOMY  05/17/2012  Procedure: SALPINGO OOPHERECTOMY;  Surgeon: Jonnie Kind, MD;  Location: [facility];  Service: Gynecology;  Laterality: Bilateral;  . VAGINAL HYSTERECTOMY  05/17/2012   Procedure: HYSTERECTOMY VAGINAL;  Surgeon: Jonnie Kind, MD;  Location: [facility];  Service: Gynecology;  Laterality: N/A;     OB History   No obstetric history on file.      Home Medications    Prior to Admission medications   Medication Sig Start Date End Date Taking? Authorizing Provider  acetaminophen  (TYLENOL) 500 MG tablet Take 500 mg by mouth every 6 (six) hours as needed for moderate pain.    [provider]  CALCIUM PO Take by mouth daily.    [provider]  ferrous sulfate 325 (65 FE) MG tablet Take 325 mg by mouth daily with breakfast.    [provider]  HYDROcodone-acetaminophen (NORCO/VICODIN) 5-325 MG tablet One tablet every four hours as needed for acute pain.  Limit of five days per [place] statue. Patient not taking: Reported on 06/23/2018 10/21/17   Sanjuana Kava, MD  lisinopril-hydrochlorothiazide (PRINZIDE,ZESTORETIC) 20-12.5 MG per tablet Take 1 tablet by mouth daily.    [provider]  pantoprazole (PROTONIX) 40 MG tablet Take one tablet 30 minutes before breakfast daily to every other day. 09/21/18   Mahala Menghini, PA-C    Family History Family History  Problem Relation Age of Onset  . Hypertension Mother   . Lymphoma Brother   . Anesthesia problems Neg Hx   . Hypotension Neg Hx   . Malignant hyperthermia Neg Hx   . Pseudochol deficiency Neg Hx   . Colon cancer Neg Hx     Social History Social History   Tobacco Use  . Smoking status: Never Smoker  . Smokeless tobacco: Never Used  . Tobacco comment: Never smoked  Substance Use Topics  . Alcohol use: No    Alcohol/week: 0.0 standard drinks  . Drug use: No     Allergies   Sulfa antibiotics and Naproxen sodium   Review of Systems Review of Systems  All other systems reviewed and are negative.    Physical Exam Updated Vital Signs BP (!) 140/107 (BP Location: Right Arm)   Pulse (!) 110   Temp 99.7 F (37.6 C) (Oral)   Resp 20   Ht 5\' 2"  (1.575 m)   Wt 54.4 kg   SpO2 100%   BMI 21.95 kg/m   Physical Exam Vitals signs and nursing note reviewed.    84 year old female, resting comfortably and in no acute distress. Vital signs are significant for elevated blood pressure and elevated heart rate. Oxygen saturation is 100%, which is normal. Head is  normocephalic and atraumatic. PERRLA, EOMI. Oropharynx is clear.  Conjunctivae are pale. Neck is nontender and supple without adenopathy or JVD. Back is nontender and there is no CVA tenderness. Lungs are clear without rales, wheezes, or rhonchi. Chest is nontender. Heart has regular rate and rhythm without murmur. Abdomen is soft, flat, nontender without masses or hepatosplenomegaly and peristalsis is normoactive. Rectal: Normal sphincter tone.  No masses.  Small amount of brown stool which is Hemoccult negative. Extremities have no cyanosis or edema, full range of motion is present.  No swelling or deformity of the left wrist, only mild tenderness to palpation over the wrist without point tenderness.  Full range of motion present left hip, no tenderness on palpation. Skin is warm and dry without rash. Neurologic: Mental status is normal, cranial nerves are intact,  there are no motor or sensory deficits.  ED Treatments / Results  Labs (all labs ordered are listed, but only abnormal results are displayed) Labs Reviewed  CBC WITH DIFFERENTIAL/PLATELET - Abnormal; Notable for the following components:      Result Value   RBC 2.46 (*)    Hemoglobin 5.9 (*)    HCT 21.4 (*)    MCH 24.0 (*)    MCHC 27.6 (*)    RDW 16.1 (*)    Platelets 440 (*)    Neutro Abs 7.8 (*)    All other components within normal limits  COMPREHENSIVE METABOLIC PANEL - Abnormal; Notable for the following components:   Potassium 3.4 (*)    Glucose, Bld 112 (*)    Calcium 8.5 (*)    All other components within normal limits  VITAMIN B12 - Abnormal; Notable for the following components:   Vitamin B-12 65 (*)    All other components within normal limits  IRON AND TIBC - Abnormal; Notable for the following components:   Iron 25 (*)    TIBC 496 (*)    Saturation Ratios 5 (*)    All other components within normal limits  FERRITIN - Abnormal; Notable for the following components:   Ferritin 7 (*)    All other  components within normal limits  RETICULOCYTES - Abnormal; Notable for the following components:   RBC. 2.46 (*)    Immature Retic Fract 26.1 (*)    All other components within normal limits  FOLATE  CBC  POC OCCULT BLOOD, ED  TYPE AND SCREEN  PREPARE RBC (CROSSMATCH)   Radiology Dg Wrist Complete Left  Result Date: 11/05/2018 CLINICAL DATA:  [place], tripped over dog.  Pain. EXAM: LEFT WRIST - COMPLETE 3+ VIEW COMPARISON:  None. FINDINGS: No acute fracture deformity or dislocation. Moderate first MTP joint space narrowing with periarticular sclerosis and marginal spurring compatible with osteoarthrosis. Fluffy intra-articular calcifications consistent with CPPD. Base of fifth metacarpus exostosis. Osteopenia without destructive bony lesions. Mild wrist soft tissue swelling without subcutaneous gas or radiopaque foreign bodies. IMPRESSION: 1. No acute fracture deformity or dislocation. Mild soft tissue swelling. 2. Moderate first MTP osteoarthrosis. Electronically Signed   By: Elon Alas M.D.   On: 11/05/2018 00:31   Ct Hip Left Wo Contrast  Result Date: 11/05/2018 CLINICAL DATA:  Status post fall, with left hip pain. Initial encounter. EXAM: CT OF THE LEFT HIP WITHOUT CONTRAST TECHNIQUE: Multidetector CT imaging of the left hip was performed according to the standard protocol. Multiplanar CT image reconstructions were also generated. COMPARISON:  Left hip radiographs performed 11/04/2018, and CT of the abdomen and pelvis performed 03/25/2012 FINDINGS: Bones/Joint/Cartilage No definite fracture is seen at the left femoral neck. The left femoral head remains seated at the acetabulum. The visualized osseous structures appear grossly intact. The cartilage is not well assessed on CT. No significant joint effusion is seen. Ligaments Suboptimally assessed by CT. Muscles and Tendons The visualized musculature is grossly unremarkable. The visualized tendon structures are within normal limits. Soft  tissues Diffuse diverticulosis is noted along the visualized descending and sigmoid colon, without evidence of diverticulitis. Scattered vascular calcifications are seen. IMPRESSION: 1. No definite fracture seen at the left femoral neck on CT. Depending on the degree of clinical concern, MRI could be considered to exclude occult fracture. 2. Diffuse diverticulosis along the visualized descending and sigmoid colon, without evidence of diverticulitis. 3. Scattered vascular calcifications seen. Electronically Signed   By: Francoise Schaumann.D.  On: 11/05/2018 02:01   Dg Hip Unilat W Or Wo Pelvis 2-3 Views Left  Result Date: 11/05/2018 CLINICAL DATA:  Status post fall, with acute onset of left hip pain. Initial encounter. EXAM: DG HIP (WITH OR WITHOUT PELVIS) 2-3V LEFT COMPARISON:  None. FINDINGS: There is a suspected minimally displaced transcervical fracture through the left femoral neck. The left femoral head remains seated at the acetabulum. The right hip joint is grossly unremarkable. Scattered vascular calcifications are seen. IMPRESSION: 1. Suspect minimally displaced transcervical fracture through the left femoral neck. 2. Scattered vascular calcifications seen. Electronically Signed   By: Garald Balding M.D.   On: 11/05/2018 00:33    Procedures Procedures  CRITICAL CARE Performed by: Delora Fuel Total critical care time: 45 minutes Critical care time was exclusive of separately billable procedures and treating other patients. Critical care was necessary to treat or prevent imminent or life-threatening deterioration. Critical care was time spent personally by me on the following activities: development of treatment plan with patient and/or surrogate as well as nursing, discussions with consultants, evaluation of patient's response to treatment, examination of patient, obtaining history from patient or surrogate, ordering and performing treatments and interventions, ordering and review of laboratory  studies, ordering and review of radiographic studies, pulse oximetry and re-evaluation of patient's condition.  Medications Ordered in ED Medications  lisinopril-hydrochlorothiazide (PRINZIDE,ZESTORETIC) 20-12.5 MG per tablet 1 tablet (has no administration in time range)  pantoprazole (PROTONIX) EC tablet 40 mg (40 mg Oral Not Given 11/05/18 0141)  ferrous sulfate tablet 325 mg (has no administration in time range)  sodium chloride flush (NS) 0.9 % injection 3 mL (3 mLs Intravenous Not Given 11/05/18 0033)  sodium chloride flush (NS) 0.9 % injection 3 mL (has no administration in time range)  0.9 %  sodium chloride infusion (has no administration in time range)  acetaminophen (TYLENOL) tablet 650 mg (has no administration in time range)    Or  acetaminophen (TYLENOL) suppository 650 mg (has no administration in time range)  0.9 %  sodium chloride infusion (Manually program via Guardrails IV Fluids) ( Intravenous Stopped 11/05/18 0021)  potassium chloride SA (K-DUR,KLOR-CON) CR tablet 40 mEq (40 mEq Oral Given 11/04/18 2324)     Initial Impression / Assessment and Plan / ED Course  I have reviewed the triage vital signs and the nursing notes.  Pertinent labs & imaging results that were available during my care of the patient were reviewed by me and considered in my medical decision making (see chart for details).  Symptomatic anemia.  Hemoglobin today is 5.9, last hemoglobin before today was 11.8 on September 18, 2017.  MCV is normal, but MCH and MCHC are low.  Mild thrombocytosis is also noted.  Chemistry significant for borderline hypokalemia, and she is given a dose of oral potassium.  Fall without evidence of significant injury, but will send for x-rays of left wrist and left hip.  Blood is drawn for anemia panel and she will be given transfusion of 2 units of blood.  She will need to be admitted for blood transfusion.  Case is discussed with Dr. Shanon Brow, of [facility] hospital[facility], who agrees to  admit the patient.  Hip x-ray was read by radiologist as possible femoral neck fracture.  Clinically, fracture is felt to be very unlikely.  She is sent for CT of hip which is not showing evidence of fracture.  Wrist x-rays are unremarkable.  Anemia panel shows evidence of iron deficiency.  Final Clinical Impressions(s) / ED Diagnoses  Final diagnoses:  Symptomatic anemia    ED Discharge Orders    None       Delora Fuel, MD 02/54/86 [phone]

## 2018-11-04 NOTE — ED Notes (Signed)
Date and time results received: 11/04/18 10:54 PM    Test: hemoglobin Critical Value: 5.9  Name of Provider Notified: Rogene Houston, MD  Orders Received? Or Actions Taken?: none at this time. Will continue to monitor.

## 2018-11-05 ENCOUNTER — Observation Stay (HOSPITAL_COMMUNITY): Payer: Medicare Other

## 2018-11-05 DIAGNOSIS — D509 Iron deficiency anemia, unspecified: Secondary | ICD-10-CM | POA: Diagnosis not present

## 2018-11-05 DIAGNOSIS — D649 Anemia, unspecified: Secondary | ICD-10-CM | POA: Diagnosis not present

## 2018-11-05 LAB — URINALYSIS, ROUTINE W REFLEX MICROSCOPIC
Bilirubin Urine: NEGATIVE
Glucose, UA: NEGATIVE mg/dL
Hgb urine dipstick: NEGATIVE
Ketones, ur: NEGATIVE mg/dL
Leukocytes, UA: NEGATIVE
Nitrite: NEGATIVE
Protein, ur: NEGATIVE mg/dL
Specific Gravity, Urine: 1.008 (ref 1.005–1.030)
pH: 8 (ref 5.0–8.0)

## 2018-11-05 LAB — CBC
HCT: 26.3 % — ABNORMAL LOW (ref 36.0–46.0)
Hemoglobin: 8.1 g/dL — ABNORMAL LOW (ref 12.0–15.0)
MCH: 26.4 pg (ref 26.0–34.0)
MCHC: 30.8 g/dL (ref 30.0–36.0)
MCV: 85.7 fL (ref 80.0–100.0)
Platelets: 339 10*3/uL (ref 150–400)
RBC: 3.07 MIL/uL — ABNORMAL LOW (ref 3.87–5.11)
RDW: 15.6 % — ABNORMAL HIGH (ref 11.5–15.5)
WBC: 8.8 10*3/uL (ref 4.0–10.5)
nRBC: 0 % (ref 0.0–0.2)

## 2018-11-05 LAB — VITAMIN B12: Vitamin B-12: 65 pg/mL — ABNORMAL LOW (ref 180–914)

## 2018-11-05 LAB — IRON AND TIBC
Iron: 25 ug/dL — ABNORMAL LOW (ref 28–170)
Saturation Ratios: 5 % — ABNORMAL LOW (ref 10.4–31.8)
TIBC: 496 ug/dL — ABNORMAL HIGH (ref 250–450)
UIBC: 471 ug/dL

## 2018-11-05 LAB — FOLATE: Folate: 10.8 ng/mL (ref >5.9)

## 2018-11-05 LAB — FERRITIN: Ferritin: 7 ng/mL — ABNORMAL LOW (ref 11–307)

## 2018-11-05 MED ORDER — LISINOPRIL 10 MG PO TABS
20.0000 mg | ORAL_TABLET | Freq: Every day | ORAL | Status: DC
  Filled 2018-11-05: qty 2

## 2018-11-05 MED ORDER — FERROUS SULFATE 325 (65 FE) MG PO TABS: 325.0000 mg | ORAL_TABLET | Freq: Every day | ORAL | 3 refills | Status: DC

## 2018-11-05 MED ORDER — SODIUM CHLORIDE 0.9 % IV SOLN
510.0000 mg | Freq: Once | INTRAVENOUS | Status: AC
  Administered 2018-11-05: 510 mg via INTRAVENOUS
  Filled 2018-11-05: qty 17

## 2018-11-05 MED ORDER — HYDROCHLOROTHIAZIDE 12.5 MG PO CAPS
12.5000 mg | ORAL_CAPSULE | Freq: Every day | ORAL | Status: DC
  Filled 2018-11-05 (×3): qty 1

## 2018-11-05 NOTE — Discharge Summary (Signed)
Physician Discharge Summary  Wanda [name] PPJ:[id number] DOB: 03-23-1933 DOA: 11/04/2018  PCP: Barry Dienes, NP  Admit date: 11/04/2018  Discharge date: 11/05/2018  Admitted From:Home  Disposition:  Home  Recommendations for Outpatient Follow-up:  1. Follow up with PCP in 1-2 weeks 2. Repeat CBC in 1 week to ensure stability; discharge H&H as noted below 3. Continue on ferrous sulfate 325 mg as prescribed daily  Home Health: None  Equipment/Devices: None  Discharge Condition: Stable  CODE STATUS: Full  Diet recommendation: Heart Healthy  Brief/Interim Summary: Per HPI: Wanda [name] is a 84 y.o. female with medical history significant of fe def anemia, htn comes in from pcp office for abnormal labs.  Pt has been having weeks of fatigue and weakness.  She does not take her iron pills but maybe twice a week.  Known fe def anemia.  Denies any chronic abdominal pain or gerd symtpoms.  No melena, brbpr.  Pt found to have hgb less than 6, a year ago was over 11.  Referred for admission for symptomatic anemia requiring blood tranfusion.  Patient has undergone a 2 unit PRBC transfusion with no significant complications noted.  Hemoglobin level has improved and it is 8.1 on discharge today.  She is also received some Feraheme on account of low iron levels and will have another prescription for 325 mg ferrous sulfate to take on a regular basis which she has been encouraged to do to maintain her iron levels.  No other acute events noted during the course of this admission.  Discharge Diagnoses:  Principal Problem:   Symptomatic anemia Active Problems:   Iron deficiency anemia   HTN (hypertension)    Discharge Instructions  Discharge Instructions    Diet - low sodium heart healthy   Complete by:  As directed    Increase activity slowly   Complete by:  As directed      Allergies as of 11/05/2018      Reactions   Sulfa Antibiotics    unknown   Naproxen Sodium Rash, Other  (See Comments)   Passed out      Medication List    TAKE these medications   acetaminophen 500 MG tablet Commonly known as:  TYLENOL Take 500 mg by mouth every 6 (six) hours as needed for moderate pain.   CALCIUM PO Take by mouth daily.   ferrous sulfate 325 (65 FE) MG tablet Take 1 tablet (325 mg total) by mouth daily with breakfast.   HYDROcodone-acetaminophen 5-325 MG tablet Commonly known as:  NORCO/VICODIN One tablet every four hours as needed for acute pain.  Limit of five days per [place].   lisinopril-hydrochlorothiazide 20-12.5 MG tablet Commonly known as:  PRINZIDE,ZESTORETIC Take 1 tablet by mouth daily.   pantoprazole 40 MG tablet Commonly known as:  PROTONIX Take one tablet 30 minutes before breakfast daily to every other day.      Follow-up Information    Barry Dienes, NP Follow up in 1 week(s).   Specialty:  Nurse Practitioner Contact information: [address] [place] [address] [phone]          Allergies  Allergen Reactions  . Sulfa Antibiotics     unknown  . Naproxen Sodium Rash and Other (See Comments)    Passed out    Consultations:  None   Procedures/Studies: Dg Wrist Complete Left  Result Date: 11/05/2018 CLINICAL DATA:  [place], tripped over dog.  Pain. EXAM: LEFT WRIST - COMPLETE 3+ VIEW COMPARISON:  None.  FINDINGS: No acute fracture deformity or dislocation. Moderate first MTP joint space narrowing with periarticular sclerosis and marginal spurring compatible with osteoarthrosis. Fluffy intra-articular calcifications consistent with CPPD. Base of fifth metacarpus exostosis. Osteopenia without destructive bony lesions. Mild wrist soft tissue swelling without subcutaneous gas or radiopaque foreign bodies. IMPRESSION: 1. No acute fracture deformity or dislocation. Mild soft tissue swelling. 2. Moderate first MTP osteoarthrosis. Electronically Signed   By: Elon Alas M.D.   On: 11/05/2018 00:31   Ct Hip Left Wo  Contrast  Result Date: 11/05/2018 CLINICAL DATA:  Status post fall, with left hip pain. Initial encounter. EXAM: CT OF THE LEFT HIP WITHOUT CONTRAST TECHNIQUE: Multidetector CT imaging of the left hip was performed according to the standard protocol. Multiplanar CT image reconstructions were also generated. COMPARISON:  Left hip radiographs performed 11/04/2018, and CT of the abdomen and pelvis performed 03/25/2012 FINDINGS: Bones/Joint/Cartilage No definite fracture is seen at the left femoral neck. The left femoral head remains seated at the acetabulum. The visualized osseous structures appear grossly intact. The cartilage is not well assessed on CT. No significant joint effusion is seen. Ligaments Suboptimally assessed by CT. Muscles and Tendons The visualized musculature is grossly unremarkable. The visualized tendon structures are within normal limits. Soft tissues Diffuse diverticulosis is noted along the visualized descending and sigmoid colon, without evidence of diverticulitis. Scattered vascular calcifications are seen. IMPRESSION: 1. No definite fracture seen at the left femoral neck on CT. Depending on the degree of clinical concern, MRI could be considered to exclude occult fracture. 2. Diffuse diverticulosis along the visualized descending and sigmoid colon, without evidence of diverticulitis. 3. Scattered vascular calcifications seen. Electronically Signed   By: Garald Balding M.D.   On: 11/05/2018 02:01   Dg Hip Unilat W Or Wo Pelvis 2-3 Views Left  Result Date: 11/05/2018 CLINICAL DATA:  Status post fall, with acute onset of left hip pain. Initial encounter. EXAM: DG HIP (WITH OR WITHOUT PELVIS) 2-3V LEFT COMPARISON:  None. FINDINGS: There is a suspected minimally displaced transcervical fracture through the left femoral neck. The left femoral head remains seated at the acetabulum. The right hip joint is grossly unremarkable. Scattered vascular calcifications are seen. IMPRESSION: 1. Suspect  minimally displaced transcervical fracture through the left femoral neck. 2. Scattered vascular calcifications seen. Electronically Signed   By: Garald Balding M.D.   On: 11/05/2018 00:33     Discharge Exam: Vitals:   11/05/18 1020 11/05/18 1100  BP: (!) 150/69 123/60  Pulse: 85 72  Resp: (!) 24 17  Temp:  98 F (36.7 C)  SpO2: 98% 97%   Vitals:   11/05/18 0700 11/05/18 0900 11/05/18 1020 11/05/18 1100  BP: (!) 110/59 128/69 (!) 150/69 123/60  Pulse: 71 73 85 72  Resp: 16 (!) 21 (!) 24 17  Temp:    98 F (36.7 C)  TempSrc:    Oral  SpO2: 95% 96% 98% 97%  Weight:      Height:        General: Pt is alert, awake, not in acute distress Cardiovascular: RRR, S1/S2 +, no rubs, no gallops Respiratory: CTA bilaterally, no wheezing, no rhonchi Abdominal: Soft, NT, ND, bowel sounds + Extremities: no edema, no cyanosis    The results of significant diagnostics from this hospitalization (including imaging, microbiology, ancillary and laboratory) are listed below for reference.     Microbiology: No results found for this or any previous visit (from the past 240 hour(s)).   Labs: BNP (last 3  results) No results for input(s): BNP in the last 8760 hours. Basic Metabolic Panel: Recent Labs  Lab 11/04/18 2223  NA 135  K 3.4*  CL 106  CO2 22  GLUCOSE 112*  BUN 14  CREATININE 0.69  CALCIUM 8.5*   Liver Function Tests: Recent Labs  Lab 11/04/18 2223  AST 20  ALT 13  ALKPHOS 88  BILITOT 0.9  PROT 7.1  ALBUMIN 3.8   No results for input(s): LIPASE, AMYLASE in the last 168 hours. No results for input(s): AMMONIA in the last 168 hours. CBC: Recent Labs  Lab 11/04/18 2223 11/05/18 0608  WBC 9.8 8.8  NEUTROABS 7.8*  --   HGB 5.9* 8.1*  HCT 21.4* 26.3*  MCV 87.0 85.7  PLT 440* 339   Cardiac Enzymes: No results for input(s): CKTOTAL, CKMB, CKMBINDEX, TROPONINI in the last 168 hours. BNP: Invalid input(s): POCBNP CBG: No results for input(s): GLUCAP in the last  168 hours. D-Dimer No results for input(s): DDIMER in the last 72 hours. Hgb A1c No results for input(s): HGBA1C in the last 72 hours. Lipid Profile No results for input(s): CHOL, HDL, LDLCALC, TRIG, CHOLHDL, LDLDIRECT in the last 72 hours. Thyroid function studies No results for input(s): TSH, T4TOTAL, T3FREE, THYROIDAB in the last 72 hours.  Invalid input(s): FREET3 Anemia work up Recent Labs    11/04/18 2228  VITAMINB12 65*  FOLATE 10.8  FERRITIN 7*  TIBC 496*  IRON 25*  RETICCTPCT 2.6   Urinalysis    Component Value Date/Time   COLORURINE YELLOW 11/05/2018 [place] 11/05/2018 1029   LABSPEC 1.008 11/05/2018 1029   PHURINE 8.0 11/05/2018 1029   GLUCOSEU NEGATIVE 11/05/2018 1029   HGBUR NEGATIVE 11/05/2018 1029   BILIRUBINUR NEGATIVE 11/05/2018 1029   KETONESUR NEGATIVE 11/05/2018 1029   PROTEINUR NEGATIVE 11/05/2018 1029   UROBILINOGEN 0.2 05/11/2012 1000   NITRITE NEGATIVE 11/05/2018 1029   LEUKOCYTESUR NEGATIVE 11/05/2018 1029   Sepsis Labs Invalid input(s): PROCALCITONIN,  WBC,  LACTICIDVEN Microbiology No results found for this or any previous visit (from the past 240 hour(s)).   Time coordinating discharge: 35 minutes  SIGNED:   Rodena Goldmann, DO [facility] 11/05/2018, 11:28 AM Pager [phone]  If 7PM-7AM, please contact night-coverage [email or web address] Password [email or web address]

## 2018-11-05 NOTE — ED Notes (Signed)
Hospitalist at bedside at this time 

## 2018-11-05 NOTE — ED Notes (Signed)
Patient transported to CT 

## 2018-11-05 NOTE — ED Notes (Addendum)
Patient able to obtain urine specimen at this time. Orthostatic vitals complete. Patient states that she feels really weak. Patient taken to CT. Patient given yellow fall risk bracelet and socks.

## 2018-11-05 NOTE — H&P (Signed)
History and Physical    Wanda [name] CWC:[id number] DOB: 1933/08/01 DOA: 11/04/2018  PCP: Barry Dienes, NP  Patient coming from: home  Chief Complaint:  tired  HPI: Wanda [name] is a 84 y.o. female with medical history significant of fe def anemia, htn comes in from pcp office for abnormal labs.  Pt has been having weeks of fatigue and weakness.  She does not take her iron pills but maybe twice a week.  Known fe def anemia.  Denies any chronic abdominal pain or gerd symtpoms.  No melena, brbpr.  Pt found to have hgb less than 6, a year ago was over 11.  Referred for admission for symptomatic anemia requiring blood tranfusion.  Review of Systems: As per HPI otherwise 10 point review of systems negative.   Past Medical History:  Diagnosis Date  . Cystocele   . Diverticulosis 10/20/10   Left-sided on colonoscopy by Dr. Gala Romney 10/20/10  . Gastric polyps 10/02/2013  . Gastritis 12/08/10   EGD by Dr. Altha Harm hiatal hernia, duodenal diverticulum, chronic gastritis  . GERD (gastroesophageal reflux disease)   . Heart murmur   . Hiatal hernia 10/02/2013  . HTN (hypertension)   . IDA (iron deficiency anemia)   . 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 Osteoarthritis   . Radial fracture    (right) Undergoing treatment by Dr. Aline Brochure currently  . Sleep apnea    STOP BANG SCORE 4  . Urge incontinence   . Vitamin D deficiency     Past Surgical History:  Procedure Laterality Date  . ABDOMINAL HYSTERECTOMY    . ANTERIOR AND POSTERIOR REPAIR  05/17/2012   Procedure: ANTERIOR (CYSTOCELE) AND POSTERIOR REPAIR (RECTOCELE);  Surgeon: Jonnie Kind, MD;  Location: [facility];  Service: Gynecology;  Laterality: N/A;  . COLONOSCOPY  02/29/2012   Dr. Gala Romney: colonic diverticulosis, minimal internal hemorrhoids, benign polyp  . ESOPHAGOGASTRODUODENOSCOPY  02/29/2012   Dr. Gala Romney: atonic esophagus, moderate-sized hiatal hernia, fundal gland polyps  . ESOPHAGOGASTRODUODENOSCOPY N/A 10/02/2013     Dr. Gala Romney- multiple Wilhemena Durie gastric polyps- not manipulated  . ESOPHAGOGASTRODUODENOSCOPY N/A 01/14/2015   Dr. Gala Romney: Large hiatal hernia, otherwise normal EGD  . GIVENS CAPSULE STUDY  02/29/2012   No explanation for IDA. Possible extrinsic compression but negative CT.   Marland Kitchen SALPINGOOPHORECTOMY  05/17/2012   Procedure: SALPINGO OOPHERECTOMY;  Surgeon: Jonnie Kind, MD;  Location: [facility];  Service: Gynecology;  Laterality: Bilateral;  . VAGINAL HYSTERECTOMY  05/17/2012   Procedure: HYSTERECTOMY VAGINAL;  Surgeon: Jonnie Kind, MD;  Location: [facility];  Service: Gynecology;  Laterality: N/A;     reports that she has never smoked. She has never used smokeless tobacco. She reports that she does not drink alcohol or use drugs.  Allergies  Allergen Reactions  . Sulfa Antibiotics     unknown  . Naproxen Sodium Rash and Other (See Comments)    Passed out    Family History  Problem Relation Age of Onset  . Hypertension Mother   . Lymphoma Brother   . Anesthesia problems Neg Hx   . Hypotension Neg Hx   . Malignant hyperthermia Neg Hx   . Pseudochol deficiency Neg Hx   . Colon cancer Neg Hx     Prior to Admission medications   Medication Sig Start Date End Date Taking? Authorizing Provider  acetaminophen (TYLENOL) 500 MG tablet Take 500 mg by mouth every 6 (six) hours as needed for moderate pain.    [provider]  CALCIUM PO Take by mouth daily.    [provider]  ferrous sulfate 325 (65 FE) MG tablet Take 325 mg by mouth daily with breakfast.    [provider]  HYDROcodone-acetaminophen (NORCO/VICODIN) 5-325 MG tablet One tablet every four hours as needed for acute pain.  Limit of five days per  statue. Patient not taking: Reported on 06/23/2018 10/21/17   Sanjuana Kava, MD  lisinopril-hydrochlorothiazide (PRINZIDE,ZESTORETIC) 20-12.5 MG per tablet Take 1 tablet by mouth daily.    [provider]  pantoprazole (PROTONIX) 40 MG  tablet Take one tablet 30 minutes before breakfast daily to every other day. 09/21/18   Mahala Menghini, PA-C    Physical Exam: Vitals:   11/04/18 2211 11/04/18 2212 11/04/18 2340  BP: (!) 140/107  (!) 147/76  Pulse: (!) 110  96  Resp: 20    Temp: 99.7 F (37.6 C)    TempSrc: Oral    SpO2: 100%  98%  Weight:  54.4 kg   Height:  5\' 2"  (1.575 m)       Constitutional: NAD, calm, comfortable Vitals:   11/04/18 2211 11/04/18 2212 11/04/18 2340  BP: (!) 140/107  (!) 147/76  Pulse: (!) 110  96  Resp: 20    Temp: 99.7 F (37.6 C)    TempSrc: Oral    SpO2: 100%  98%  Weight:  54.4 kg   Height:  5\' 2"  (1.575 m)    Eyes: PERRL, lids and conjunctivae normal ENMT: Mucous membranes are moist. Posterior pharynx clear of any exudate or lesions.Normal dentition.  Neck: normal, supple, no masses, no thyromegaly Respiratory: clear to auscultation bilaterally, no wheezing, no crackles. Normal respiratory effort. No accessory muscle use.  Cardiovascular: Regular rate and rhythm, no murmurs / rubs / gallops. No extremity edema. 2+ pedal pulses. No carotid bruits.  Abdomen: no tenderness, no masses palpated. No hepatosplenomegaly. Bowel sounds positive.  Musculoskeletal: no clubbing / cyanosis. No joint deformity upper and lower extremities. Good ROM, no contractures. Normal muscle tone.  Skin: no rashes, lesions, ulcers. No induration Neurologic: CN 2-12 grossly intact. Sensation intact, DTR normal. Strength 5/5 in all 4.  Psychiatric: Normal judgment and insight. Alert and oriented x 3. Normal mood.    Labs on Admission: I have personally reviewed following labs and imaging studies  CBC: Recent Labs  Lab 11/04/18 2223  WBC 9.8  NEUTROABS 7.8*  HGB 5.9*  HCT 21.4*  MCV 87.0  PLT 433*   Basic Metabolic Panel: Recent Labs  Lab 11/04/18 2223  NA 135  K 3.4*  CL 106  CO2 22  GLUCOSE 112*  BUN 14  CREATININE 0.69  CALCIUM 8.5*   GFR: Estimated Creatinine Clearance: 42.1  mL/min (by C-G formula based on SCr of 0.69 mg/dL). Liver Function Tests: Recent Labs  Lab 11/04/18 2223  AST 20  ALT 13  ALKPHOS 88  BILITOT 0.9  PROT 7.1  ALBUMIN 3.8   No results for input(s): LIPASE, AMYLASE in the last 168 hours. No results for input(s): AMMONIA in the last 168 hours. Coagulation Profile: No results for input(s): INR, PROTIME in the last 168 hours. Cardiac Enzymes: No results for input(s): CKTOTAL, CKMB, CKMBINDEX, TROPONINI in the last 168 hours. BNP (last 3 results) No results for input(s): PROBNP in the last 8760 hours. HbA1C: No results for input(s): HGBA1C in the last 72 hours. CBG: No results for input(s): GLUCAP in the last 168 hours. Lipid Profile: No results for input(s): CHOL, HDL, LDLCALC, TRIG,  CHOLHDL, LDLDIRECT in the last 72 hours. Thyroid Function Tests: No results for input(s): TSH, T4TOTAL, FREET4, T3FREE, THYROIDAB in the last 72 hours. Anemia Panel: Recent Labs    11/04/18 2228  VITAMINB12 65*  FERRITIN 7*  TIBC 496*  IRON 25*  RETICCTPCT 2.6   Urine analysis:    Component Value Date/Time   COLORURINE YELLOW 05/11/2012 1000   APPEARANCEUR CLEAR 05/11/2012 1000   LABSPEC 1.015 05/11/2012 1000   PHURINE 6.5 05/11/2012 1000   GLUCOSEU NEGATIVE 05/11/2012 1000   HGBUR TRACE (A) 05/11/2012 1000   BILIRUBINUR NEGATIVE 05/11/2012 1000   KETONESUR NEGATIVE 05/11/2012 1000   PROTEINUR NEGATIVE 05/11/2012 1000   UROBILINOGEN 0.2 05/11/2012 1000   NITRITE NEGATIVE 05/11/2012 1000   LEUKOCYTESUR TRACE (A) 05/11/2012 1000   Sepsis Labs: !!!!!!!!!!!!!!!!!!!!!!!!!!!!!!!!!!!!!!!!!!!! @LABRCNTIP (procalcitonin:4,lacticidven:4) )No results found for this or any previous visit (from the past 240 hour(s)).   Radiological Exams on Admission: No results found.  Old chart reviewed Case discussed with dr knapp in th eED  Assessment/Plan 84 yo female with symptomatic iron def anemia without any overt bleeding  Principal Problem:    Symptomatic anemia- anemia panel pending.  Transfuse 2 units overnight.  obs on med.  D/c tom with cont outpt follow up unless overt gi blood losses become evident.  Heme neg  Active Problems:   Iron deficiency anemia- f/u anemia panel.  Noncompliant with oral fe   HTN (hypertension)- stable     DVT prophylaxis: scds Code Status:  full Family Communication: dtr Disposition Plan:  tomorrow Consults called: none Admission status: observation   Wanda [name] A MD [facility]  If 7PM-7AM, please contact night-coverage [email or web address] Password [email or web address]  11/05/2018, 12:06 AM

## 2018-11-05 NOTE — ED Notes (Signed)
Pt provided Hospital Bed in ED. Family at bedside.

## 2018-11-06 LAB — TYPE AND SCREEN
ABO/RH(D): O POS
Antibody Screen: NEGATIVE
Unit division: 0
Unit division: 0

## 2018-11-06 LAB — BPAM RBC
Blood Product Expiration Date: 202001142359
Blood Product Expiration Date: 202001142359
ISSUE DATE / TIME: 201912210018
ISSUE DATE / TIME: 201912210257
Unit Type and Rh: 5100
Unit Type and Rh: 5100

## 2018-11-07 LAB — POC OCCULT BLOOD, ED: Fecal Occult Bld: NEGATIVE

## 2018-11-15 ENCOUNTER — Telehealth: Payer: Self-pay | Admitting: Internal Medicine

## 2018-11-15 NOTE — Telephone Encounter (Signed)
Pt needs to speak with the nurse about some lab orders. Please call [phone]

## 2018-11-15 NOTE — Telephone Encounter (Signed)
Spoke with pt and she will call back. She followed up with another doctor this week that might have taken care of the problem she wanted to be seen here. Pt was in the hospital around 11/05/18 and was asked to do a 1 week f/u. Pt followed up and had some testing done. Pt will call back if she needs to schedule an appointment.

## 2018-11-20 ENCOUNTER — Observation Stay (HOSPITAL_COMMUNITY)
Admission: EM | Admit: 2018-11-20 | Discharge: 2018-11-22 | Disposition: A | Discharge status: Home / Self Care | Payer: Medicare Other | Attending: Internal Medicine | Admitting: Internal Medicine

## 2018-11-20 ENCOUNTER — Encounter (HOSPITAL_COMMUNITY): Payer: Self-pay | Admitting: *Deleted

## 2018-11-20 ENCOUNTER — Other Ambulatory Visit: Payer: Self-pay

## 2018-11-20 DIAGNOSIS — D72829 Elevated white blood cell count, unspecified: Secondary | ICD-10-CM | POA: Diagnosis present

## 2018-11-20 DIAGNOSIS — K254 Chronic or unspecified gastric ulcer with hemorrhage: Principal | ICD-10-CM | POA: Insufficient documentation

## 2018-11-20 DIAGNOSIS — K920 Hematemesis: Secondary | ICD-10-CM | POA: Diagnosis present

## 2018-11-20 DIAGNOSIS — I10 Essential (primary) hypertension: Secondary | ICD-10-CM | POA: Diagnosis present

## 2018-11-20 DIAGNOSIS — Z79899 Other long term (current) drug therapy: Secondary | ICD-10-CM | POA: Diagnosis not present

## 2018-11-20 DIAGNOSIS — K219 Gastro-esophageal reflux disease without esophagitis: Secondary | ICD-10-CM | POA: Diagnosis not present

## 2018-11-20 DIAGNOSIS — E876 Hypokalemia: Secondary | ICD-10-CM | POA: Diagnosis not present

## 2018-11-20 DIAGNOSIS — K449 Diaphragmatic hernia without obstruction or gangrene: Secondary | ICD-10-CM | POA: Diagnosis not present

## 2018-11-20 DIAGNOSIS — D509 Iron deficiency anemia, unspecified: Secondary | ICD-10-CM | POA: Diagnosis not present

## 2018-11-20 DIAGNOSIS — M1990 Unspecified osteoarthritis, unspecified site: Secondary | ICD-10-CM | POA: Diagnosis not present

## 2018-11-20 DIAGNOSIS — E559 Vitamin D deficiency, unspecified: Secondary | ICD-10-CM | POA: Diagnosis not present

## 2018-11-20 DIAGNOSIS — D649 Anemia, unspecified: Secondary | ICD-10-CM | POA: Diagnosis present

## 2018-11-20 DIAGNOSIS — G4730 Sleep apnea, unspecified: Secondary | ICD-10-CM | POA: Diagnosis present

## 2018-11-20 LAB — COMPREHENSIVE METABOLIC PANEL
ALT: 46 U/L — ABNORMAL HIGH (ref 0–44)
AST: 24 U/L (ref 15–41)
Albumin: 3.1 g/dL — ABNORMAL LOW (ref 3.5–5.0)
Alkaline Phosphatase: 76 U/L (ref 38–126)
Anion gap: 7 (ref 5–15)
BUN: 26 mg/dL — ABNORMAL HIGH (ref 8–23)
CO2: 27 mmol/L (ref 22–32)
Calcium: 8.9 mg/dL (ref 8.9–10.3)
Chloride: 108 mmol/L (ref 98–111)
Creatinine, Ser: 0.52 mg/dL (ref 0.44–1.00)
GFR calc Af Amer: >60 mL/min (ref >60)
GFR calc non Af Amer: >60 mL/min (ref >60)
Glucose, Bld: 111 mg/dL — ABNORMAL HIGH (ref 70–99)
Potassium: 3 mmol/L — ABNORMAL LOW (ref 3.5–5.1)
Sodium: 142 mmol/L (ref 135–145)
Total Bilirubin: 0.4 mg/dL (ref 0.3–1.2)
Total Protein: 5.8 g/dL — ABNORMAL LOW (ref 6.5–8.1)

## 2018-11-20 LAB — CBC
HCT: 34.6 % — ABNORMAL LOW (ref 36.0–46.0)
Hemoglobin: 10.3 g/dL — ABNORMAL LOW (ref 12.0–15.0)
MCH: 27.3 pg (ref 26.0–34.0)
MCHC: 29.8 g/dL — ABNORMAL LOW (ref 30.0–36.0)
MCV: 91.8 fL (ref 80.0–100.0)
Platelets: 550 10*3/uL — ABNORMAL HIGH (ref 150–400)
RBC: 3.77 MIL/uL — ABNORMAL LOW (ref 3.87–5.11)
RDW: 20.4 % — ABNORMAL HIGH (ref 11.5–15.5)
WBC: 15 10*3/uL — ABNORMAL HIGH (ref 4.0–10.5)
nRBC: 0 % (ref 0.0–0.2)

## 2018-11-20 LAB — MAGNESIUM: Magnesium: 1.7 mg/dL (ref 1.7–2.4)

## 2018-11-20 LAB — TYPE AND SCREEN
ABO/RH(D): O POS
Antibody Screen: NEGATIVE

## 2018-11-20 LAB — PHOSPHORUS: Phosphorus: 3.7 mg/dL (ref 2.5–4.6)

## 2018-11-20 MED ORDER — ONDANSETRON HCL 4 MG/2ML IJ SOLN: 4.0000 mg | Freq: Four times a day (QID) | INTRAMUSCULAR | Status: DC | PRN

## 2018-11-20 MED ORDER — ACETAMINOPHEN 650 MG RE SUPP: 650.0000 mg | Freq: Four times a day (QID) | RECTAL | Status: DC | PRN

## 2018-11-20 MED ORDER — POTASSIUM CHLORIDE IN NACL 40-0.9 MEQ/L-% IV SOLN
INTRAVENOUS | Status: DC
  Administered 2018-11-21 (×2): 100 mL/h via INTRAVENOUS
  Filled 2018-11-20 (×5): qty 1000

## 2018-11-20 MED ORDER — PANTOPRAZOLE SODIUM 40 MG IV SOLR
40.0000 mg | Freq: Once | INTRAVENOUS | Status: AC
  Administered 2018-11-20: 40 mg via INTRAVENOUS
  Filled 2018-11-20: qty 40

## 2018-11-20 MED ORDER — PANTOPRAZOLE SODIUM 40 MG IV SOLR
40.0000 mg | Freq: Two times a day (BID) | INTRAVENOUS | Status: DC
  Administered 2018-11-21 (×2): 40 mg via INTRAVENOUS
  Filled 2018-11-20 (×2): qty 40

## 2018-11-20 MED ORDER — ACETAMINOPHEN 325 MG PO TABS: 650.0000 mg | ORAL_TABLET | Freq: Four times a day (QID) | ORAL | Status: DC | PRN

## 2018-11-20 MED ORDER — SODIUM CHLORIDE 0.9 % IV BOLUS
1000.0000 mL | Freq: Once | INTRAVENOUS | Status: AC
  Administered 2018-11-20: 1000 mL via INTRAVENOUS

## 2018-11-20 MED ORDER — ONDANSETRON HCL 4 MG PO TABS: 4.0000 mg | ORAL_TABLET | Freq: Four times a day (QID) | ORAL | Status: DC | PRN

## 2018-11-20 MED ORDER — ONDANSETRON HCL 4 MG/2ML IJ SOLN
4.0000 mg | Freq: Once | INTRAMUSCULAR | Status: AC
  Administered 2018-11-20: 4 mg via INTRAVENOUS
  Filled 2018-11-20: qty 2

## 2018-11-20 NOTE — ED Notes (Signed)
Wanda [name], [phone]

## 2018-11-20 NOTE — H&P (Signed)
History and Physical    Wanda [name]:[id number] DOB: May 08, 1933 DOA: 11/20/2018  PCP: Barry Dienes, NP   Patient coming from: Home.  I have personally briefly reviewed patient's old medical records in [place]  Chief Complaint: "Spitting up dark blood"  HPI: Wanda [name] is a 85 y.o. female with medical history significant of cystocele, diverticulosis, history of gastric polyps, gastritis, GERD, hiatal hernia, multiple gastric erosions, hypertension, iron deficiency anemia, osteoarthritis, history of right radial fracture, history of sleep apnea, urge continence who was brought to the emergency department with complaints of dry heaving, several episodes of emesis, and at least 2 episodes hematemesis/coffee-ground since earlier today.  She states that she has been having epigastric discomfort since she started using prescription ibuprofen for arthritis earlier this week.  By Wednesday, her discomfort was significant enough that she decrease her ibuprofen intake to only once a day from 3 times a day.  She has some heartburn on Thursday and Friday.  She was just feeling relatively well on Saturday.  She was able to eat some fish sticks and rice.  Earlier in the day, the patient has some leftover fish sticks and rice.  However, she states that this did not settle well in her stomach.  She subsequently developed nausea, emesis and abdominal pain.  She denies diarrhea, constipation or hematochezia.  She gets frequent melanotic looking stools due to iron supplementation use.  Fever, chills, but feels fatigue and malaise.  No recent headache, sore throat, rhinorrhea, dyspnea, wheezing or hemoptysis.  She complains of dizziness, but denied chest pain, palpitations, diaphoresis, PND, orthopnea or pitting edema of the lower extremities.  She denies polyuria, polydipsia, polyphagia or blurred vision.  ED Course: Initial vital signs temperature 98.7 F, pulse 88, respirations 18, blood pressure  129/89 mmHg and O2 sat 98% on room air. She received a 1000 mL NS bolus, Protonix 40 mg IVP x1 and Zofran 4 mg IVP x1 in the emergency department.  White count was 15.0, hemoglobin 10.3 g/dL and platelets 550.  Her CMP shows a potassium of 3.0 mmol/L.  All other electrolytes are within expected limits.  BUN 26, creatinine 0.52 and glucose 111 mg/dL.  Total protein was 5.8 and albumin 3.1 g/dL.  ALT was slightly elevated 46 units/L, all other hepatic function values are normal.  Review of Systems: As per HPI otherwise 10 point review of systems negative.  Past Medical History:  Diagnosis Date  . Cystocele   . Diverticulosis 10/20/10   Left-sided on colonoscopy by Dr. Gala Romney 10/20/10  . Gastric polyps 10/02/2013  . Gastritis 12/08/10   EGD by Dr. Altha Harm hiatal hernia, duodenal diverticulum, chronic gastritis  . GERD (gastroesophageal reflux disease)   . Heart murmur   . Hiatal hernia 10/02/2013  . HTN (hypertension)   . IDA (iron deficiency anemia)   . 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 Osteoarthritis   . Radial fracture    (right) Undergoing treatment by Dr. Aline Brochure currently  . Sleep apnea    STOP BANG SCORE 4  . Urge incontinence   . Vitamin D deficiency     Past Surgical History:  Procedure Laterality Date  . ABDOMINAL HYSTERECTOMY    . ANTERIOR AND POSTERIOR REPAIR  05/17/2012   Procedure: ANTERIOR (CYSTOCELE) AND POSTERIOR REPAIR (RECTOCELE);  Surgeon: Jonnie Kind, MD;  Location: [facility];  Service: Gynecology;  Laterality: N/A;  . COLONOSCOPY  02/29/2012   Dr. Gala Romney: colonic diverticulosis, minimal internal hemorrhoids, benign polyp  .  ESOPHAGOGASTRODUODENOSCOPY  02/29/2012   Dr. Gala Romney: atonic esophagus, moderate-sized hiatal hernia, fundal gland polyps  . ESOPHAGOGASTRODUODENOSCOPY N/A 10/02/2013   Dr. Gala Romney- multiple Wilhemena Durie gastric polyps- not manipulated  . ESOPHAGOGASTRODUODENOSCOPY N/A 01/14/2015   Dr. Gala Romney: Large hiatal hernia, otherwise  normal EGD  . GIVENS CAPSULE STUDY  02/29/2012   No explanation for IDA. Possible extrinsic compression but negative CT.   Marland Kitchen SALPINGOOPHORECTOMY  05/17/2012   Procedure: SALPINGO OOPHERECTOMY;  Surgeon: Jonnie Kind, MD;  Location: [facility];  Service: Gynecology;  Laterality: Bilateral;  . VAGINAL HYSTERECTOMY  05/17/2012   Procedure: HYSTERECTOMY VAGINAL;  Surgeon: Jonnie Kind, MD;  Location: [facility];  Service: Gynecology;  Laterality: N/A;     reports that she has never smoked. She has never used smokeless tobacco. She reports that she does not drink alcohol or use drugs.  Allergies  Allergen Reactions  . Sulfa Antibiotics     unknown  . Naproxen Sodium Rash and Other (See Comments)    Passed out    Family History  Problem Relation Age of Onset  . Hypertension Mother   . Lymphoma Brother   . Anesthesia problems Neg Hx   . Hypotension Neg Hx   . Malignant hyperthermia Neg Hx   . Pseudochol deficiency Neg Hx   . Colon cancer Neg Hx    Prior to Admission medications   Medication Sig Start Date End Date Taking? Authorizing Provider  CALCIUM PO Take 300-600 mg by mouth daily. Has to be cut in half to take   Yes [provider]  ferrous sulfate 325 (65 FE) MG tablet Take 1 tablet (325 mg total) by mouth daily with breakfast. 11/05/18 12/05/18 Yes Shah, Pratik D, DO  lisinopril-hydrochlorothiazide (PRINZIDE,ZESTORETIC) 20-12.5 MG per tablet Take 1 tablet by mouth daily.   Yes [provider]  pantoprazole (PROTONIX) 40 MG tablet Take one tablet 30 minutes before breakfast daily to every other day. Patient taking differently: Take 40 mg by mouth every other day. Take one tablet 30 minutes before breakfast daily to every other day. 09/21/18  Yes Mahala Menghini, PA-C  PREDNISONE PO Take by mouth See admin instructions. 3 daily for 3 days, then take two tablet daily for 3 days, then take 1 tablet daily for 3 days then stop.   Yes [provider]    HYDROcodone-acetaminophen (NORCO/VICODIN) 5-325 MG tablet One tablet every four hours as needed for acute pain.  Limit of five days per [place] statue. Patient not taking: Reported on 11/20/2018 10/21/17   Sanjuana Kava, MD    Physical Exam: Vitals:   11/20/18 1854 11/20/18 1856 11/20/18 2210  BP: 129/89  (!) 149/87  Pulse: 88  80  Resp: 18  18  Temp: 98.7 F (37.1 C)    TempSrc: Oral    SpO2: 98%  100%  Weight:  54.4 kg   Height:  5\' 2"  (1.575 m)     Constitutional: NAD, calm, comfortable Eyes: PERRL, lids and conjunctivae normal ENMT: Mucous membranes are mildly dry. Posterior pharynx clear of any exudate or lesions.delete Neck: normal, supple, no masses, no thyromegaly Respiratory: clear to auscultation bilaterally, no wheezing, no crackles. Normal respiratory effort. No accessory muscle use.  Cardiovascular: Regular rate and rhythm, no murmurs / rubs / gallops. No extremity edema. 2+ pedal pulses. No carotid bruits.  Abdomen: Soft, mild epigastric tenderness, no masses palpated. No hepatosplenomegaly. Bowel sounds positive.  Musculoskeletal: no clubbing / cyanosis. Good ROM, no contractures. Normal muscle tone.  Skin: no rashes, lesions, ulcers. No induration on limited dermatological examination. Neurologic: CN 2-12 grossly intact. Sensation intact, DTR normal. Strength 5/5 in all 4.  Psychiatric: Normal judgment and insight. Alert and oriented x 3. Normal mood.   Labs on Admission: I have personally reviewed following labs and imaging studies  CBC: Recent Labs  Lab 11/20/18 2225  WBC 15.0*  HGB 10.3*  HCT 34.6*  MCV 91.8  PLT 026*   Basic Metabolic Panel: Recent Labs  Lab 11/20/18 2225  NA 142  K 3.0*  CL 108  CO2 27  GLUCOSE 111*  BUN 26*  CREATININE 0.52  CALCIUM 8.9   GFR: Estimated Creatinine Clearance: 42.1 mL/min (by C-G formula based on SCr of 0.52 mg/dL). Liver Function Tests: Recent Labs  Lab 11/20/18 2225  AST 24  ALT 46*  ALKPHOS 76   BILITOT 0.4  PROT 5.8*  ALBUMIN 3.1*   No results for input(s): LIPASE, AMYLASE in the last 168 hours. No results for input(s): AMMONIA in the last 168 hours. Coagulation Profile: No results for input(s): INR, PROTIME in the last 168 hours. Cardiac Enzymes: No results for input(s): CKTOTAL, CKMB, CKMBINDEX, TROPONINI in the last 168 hours. BNP (last 3 results) No results for input(s): PROBNP in the last 8760 hours. HbA1C: No results for input(s): HGBA1C in the last 72 hours. CBG: No results for input(s): GLUCAP in the last 168 hours. Lipid Profile: No results for input(s): CHOL, HDL, LDLCALC, TRIG, CHOLHDL, LDLDIRECT in the last 72 hours. Thyroid Function Tests: No results for input(s): TSH, T4TOTAL, FREET4, T3FREE, THYROIDAB in the last 72 hours. Anemia Panel: No results for input(s): VITAMINB12, FOLATE, FERRITIN, TIBC, IRON, RETICCTPCT in the last 72 hours. Urine analysis:    Component Value Date/Time   COLORURINE YELLOW 11/05/2018 [place] 11/05/2018 1029   LABSPEC 1.008 11/05/2018 1029   PHURINE 8.0 11/05/2018 1029   GLUCOSEU NEGATIVE 11/05/2018 1029   HGBUR NEGATIVE 11/05/2018 1029   BILIRUBINUR NEGATIVE 11/05/2018 1029   KETONESUR NEGATIVE 11/05/2018 1029   PROTEINUR NEGATIVE 11/05/2018 1029   UROBILINOGEN 0.2 05/11/2012 1000   NITRITE NEGATIVE 11/05/2018 1029   LEUKOCYTESUR NEGATIVE 11/05/2018 1029    Radiological Exams on Admission: No results found.  EKG: Independently reviewed.   Assessment/Plan Principal Problem:   Hematemesis Observation/stepdown. Keep n.p.o. Continue Protonix 40 mg IVP every 12 hours. Monitor hematocrit and hemoglobin. Transfuse as needed. Consult gastroenterology in a.m.  Active Problems:   Normocytic anemia Monitor hematocrit and hemoglobin. Transfuse PRBC as needed.    GERD Protonix 40 mg p.o. daily.    HTN (hypertension) Hold lisinopril and hydrochlorothiazide. Monitor blood pressure.    Sleep  apnea Currently not using CPAP.    Hypokalemia Likely due to HCTZ use. Replacing. Check magnesium level. Supplement magnesium as needed. Follow-up potassium level.    Leukocytosis No fever or apparent source of infection. Monitor temperature, signs and symptoms of infection. Follow-up WBC in a.m.Marland Kitchen     DVT prophylaxis: SCDs. Code Status: Full code. Family Communication: Disposition Plan: Admit to stepdown for close monitoring of hematemesis. Consults called: Routine gastroenterology consult. Admission status: Observation/stepdown.   Reubin Milan MD [facility]  If 7PM-7AM, please contact night-coverage [email or web address] Password [facility]  11/20/2018, 11:23 PM

## 2018-11-20 NOTE — ED Provider Notes (Signed)
[facility] EMERGENCY DEPARTMENT Provider Note   CSN: [id number] Arrival date & time: 11/20/18  1845     History   Chief Complaint Chief Complaint  Patient presents with  . Hematemesis    HPI Wanda [name] is a 85 y.o. female.  Level 5 caveat for urgent need for intervention.  Patient initially vomited a dark fluid, followed by coffee-ground emesis, followed by frank blood earlier this evening.  Patient was admitted to the hospital on 11/04/2018 with symptomatic anemia.  Hemoglobin at that time was 5.9.  She received 2 units of blood and an Fe infusion during that admission.  No follow-up with GI.  No black stool today.  She has taken prednisone recently.  Severity symptoms is moderate.  Nothing makes symptoms better or worse.     Past Medical History:  Diagnosis Date  . Cystocele   . Diverticulosis 10/20/10   Left-sided on colonoscopy by Dr. Gala Romney 10/20/10  . Gastric polyps 10/02/2013  . Gastritis 12/08/10   EGD by Dr. Altha Harm hiatal hernia, duodenal diverticulum, chronic gastritis  . GERD (gastroesophageal reflux disease)   . Heart murmur   . Hiatal hernia 10/02/2013  . HTN (hypertension)   . IDA (iron deficiency anemia)   . 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 Osteoarthritis   . Radial fracture    (right) Undergoing treatment by Dr. Aline Brochure currently  . Sleep apnea    STOP BANG SCORE 4  . Urge incontinence   . Vitamin D deficiency     Patient Active Problem List   Diagnosis Date Noted  . Symptomatic anemia 11/04/2018  . Diarrhea, functional 03/15/2015  . Fatigue 12/28/2014  . Upper abdominal pain 12/28/2014  . B12 deficiency 12/28/2014    Class: History of  . Acute blood loss anemia 10/02/2013  . GI bleed 10/02/2013  . Melena 10/02/2013  . Hematemesis 10/02/2013  . Sinus tachycardia 10/02/2013  . 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 Hiatal hernia 10/02/2013  . Gastric polyps 10/02/2013  . HTN (hypertension)   . IDA (iron deficiency  anemia)   . Adhesive capsulitis of right shoulder 03/01/2012  . Iron deficiency anemia 02/02/2012  . Distal radius fracture 11/30/2011  . Diverticulosis 10/20/2010  . Normocytic anemia 10/01/2010  . GERD 10/01/2010  . FECAL OCCULT BLOOD 10/01/2010    Past Surgical History:  Procedure Laterality Date  . ABDOMINAL HYSTERECTOMY    . ANTERIOR AND POSTERIOR REPAIR  05/17/2012   Procedure: ANTERIOR (CYSTOCELE) AND POSTERIOR REPAIR (RECTOCELE);  Surgeon: Jonnie Kind, MD;  Location: [facility];  Service: Gynecology;  Laterality: N/A;  . COLONOSCOPY  02/29/2012   Dr. Gala Romney: colonic diverticulosis, minimal internal hemorrhoids, benign polyp  . ESOPHAGOGASTRODUODENOSCOPY  02/29/2012   Dr. Gala Romney: atonic esophagus, moderate-sized hiatal hernia, fundal gland polyps  . ESOPHAGOGASTRODUODENOSCOPY N/A 10/02/2013   Dr. Gala Romney- multiple Wilhemena Durie gastric polyps- not manipulated  . ESOPHAGOGASTRODUODENOSCOPY N/A 01/14/2015   Dr. Gala Romney: Large hiatal hernia, otherwise normal EGD  . GIVENS CAPSULE STUDY  02/29/2012   No explanation for IDA. Possible extrinsic compression but negative CT.   Marland Kitchen SALPINGOOPHORECTOMY  05/17/2012   Procedure: SALPINGO OOPHERECTOMY;  Surgeon: Jonnie Kind, MD;  Location: [facility];  Service: Gynecology;  Laterality: Bilateral;  . VAGINAL HYSTERECTOMY  05/17/2012   Procedure: HYSTERECTOMY VAGINAL;  Surgeon: Jonnie Kind, MD;  Location: [facility];  Service: Gynecology;  Laterality: N/A;     OB History   No obstetric history on file.      Home Medications  Prior to Admission medications   Medication Sig Start Date End Date Taking? Authorizing Provider  CALCIUM PO Take 300-600 mg by mouth daily. Has to be cut in half to take   Yes [provider]  ferrous sulfate 325 (65 FE) MG tablet Take 1 tablet (325 mg total) by mouth daily with breakfast. 11/05/18 12/05/18 Yes Shah, Pratik D, DO  lisinopril-hydrochlorothiazide (PRINZIDE,ZESTORETIC) 20-12.5 MG per tablet Take  1 tablet by mouth daily.   Yes [provider]  pantoprazole (PROTONIX) 40 MG tablet Take one tablet 30 minutes before breakfast daily to every other day. Patient taking differently: Take 40 mg by mouth every other day. Take one tablet 30 minutes before breakfast daily to every other day. 09/21/18  Yes Mahala Menghini, PA-C  PREDNISONE PO Take by mouth See admin instructions. 3 daily for 3 days, then take two tablet daily for 3 days, then take 1 tablet daily for 3 days then stop.   Yes [provider]  HYDROcodone-acetaminophen (NORCO/VICODIN) 5-325 MG tablet One tablet every four hours as needed for acute pain.  Limit of five days per [place] statue. Patient not taking: Reported on 11/20/2018 10/21/17   Sanjuana Kava, MD    Family History Family History  Problem Relation Age of Onset  . Hypertension Mother   . Lymphoma Brother   . Anesthesia problems Neg Hx   . Hypotension Neg Hx   . Malignant hyperthermia Neg Hx   . Pseudochol deficiency Neg Hx   . Colon cancer Neg Hx     Social History Social History   Tobacco Use  . Smoking status: Never Smoker  . Smokeless tobacco: Never Used  . Tobacco comment: Never smoked  Substance Use Topics  . Alcohol use: No    Alcohol/week: 0.0 standard drinks  . Drug use: No     Allergies   Sulfa antibiotics and Naproxen sodium   Review of Systems Review of Systems  Unable to perform ROS: Acuity of condition     Physical Exam Updated Vital Signs BP (!) 149/87   Pulse 80   Temp 98.7 F (37.1 C) (Oral)   Resp 18   Ht 5\' 2"  (1.575 m)   Wt 54.4 kg   SpO2 100%   BMI 21.95 kg/m   Physical Exam Vitals signs and nursing note reviewed.  Constitutional:      Appearance: She is well-developed.     Comments: Alert, good color.  HENT:     Head: Normocephalic and atraumatic.  Eyes:     Conjunctiva/sclera: Conjunctivae normal.  Neck:     Musculoskeletal: Neck supple.  Cardiovascular:     Rate and Rhythm: Normal rate  and regular rhythm.  Pulmonary:     Effort: Pulmonary effort is normal.     Breath sounds: Normal breath sounds.  Abdominal:     General: Bowel sounds are normal.     Palpations: Abdomen is soft.     Comments: Tender epigastrium.  Musculoskeletal: Normal range of motion.  Skin:    General: Skin is warm and dry.  Neurological:     Mental Status: She is oriented to person, place, and time.  Psychiatric:        Behavior: Behavior normal.      ED Treatments / Results  Labs (all labs ordered are listed, but only abnormal results are displayed) Labs Reviewed  COMPREHENSIVE METABOLIC PANEL - Abnormal; Notable for the following components:      Result Value   Potassium 3.0 (*)  Glucose, Bld 111 (*)    BUN 26 (*)    Total Protein 5.8 (*)    Albumin 3.1 (*)    ALT 46 (*)    All other components within normal limits  CBC - Abnormal; Notable for the following components:   WBC 15.0 (*)    RBC 3.77 (*)    Hemoglobin 10.3 (*)    HCT 34.6 (*)    MCHC 29.8 (*)    RDW 20.4 (*)    Platelets 550 (*)    All other components within normal limits  CBC WITH DIFFERENTIAL/PLATELET  MAGNESIUM  PHOSPHORUS  TYPE AND SCREEN    EKG None  Radiology No results found.  Procedures Procedures (including critical care time)  Medications Ordered in ED Medications  ondansetron (ZOFRAN) tablet 4 mg (has no administration in time range)    Or  ondansetron (ZOFRAN) injection 4 mg (has no administration in time range)  acetaminophen (TYLENOL) tablet 650 mg (has no administration in time range)    Or  acetaminophen (TYLENOL) suppository 650 mg (has no administration in time range)  0.9 % NaCl with KCl 40 mEq / L  infusion (has no administration in time range)  sodium chloride 0.9 % bolus 1,000 mL (1,000 mLs Intravenous New Bag/Given 11/20/18 2108)  pantoprazole (PROTONIX) injection 40 mg (40 mg Intravenous Given 11/20/18 2108)  ondansetron (ZOFRAN) injection 4 mg (4 mg Intravenous Given 11/20/18  2108)     Initial Impression / Assessment and Plan / ED Course  I have reviewed the triage vital signs and the nursing notes.  Pertinent labs & imaging results that were available during my care of the patient were reviewed by me and considered in my medical decision making (see chart for details).     Patient presents with hematemesis.  Signs stable.  Hemoglobin stable at 10.3.  IV fluids, IV Protonix, IV Zofran, admit to general medicine.   CRITICAL CARE Performed by: Nat Christen Total critical care time: 30 minutes Critical care time was exclusive of separately billable procedures and treating other patients. Critical care was necessary to treat or prevent imminent or life-threatening deterioration. Critical care was time spent personally by me on the following activities: development of treatment plan with patient and/or surrogate as well as nursing, discussions with consultants, evaluation of patient's response to treatment, examination of patient, obtaining history from patient or surrogate, ordering and performing treatments and interventions, ordering and review of laboratory studies, ordering and review of radiographic studies, pulse oximetry and re-evaluation of patient's condition.  Final Clinical Impressions(s) / ED Diagnoses   Final diagnoses:  Hematemesis, presence of nausea not specified    ED Discharge Orders    None       Nat Christen, MD 11/20/18 2321

## 2018-11-20 NOTE — ED Triage Notes (Signed)
Pt c/o spitting up dark blood that started this afternoon, burning sensation in throat area.

## 2018-11-21 DIAGNOSIS — K254 Chronic or unspecified gastric ulcer with hemorrhage: Secondary | ICD-10-CM | POA: Diagnosis not present

## 2018-11-21 DIAGNOSIS — K920 Hematemesis: Secondary | ICD-10-CM | POA: Diagnosis not present

## 2018-11-21 LAB — CBC WITH DIFFERENTIAL/PLATELET
Abs Immature Granulocytes: 0.14 10*3/uL — ABNORMAL HIGH (ref 0.00–0.07)
Basophils Absolute: 0 10*3/uL (ref 0.0–0.1)
Basophils Relative: 0 %
Eosinophils Absolute: 0.2 10*3/uL (ref 0.0–0.5)
Eosinophils Relative: 2 %
HCT: 32.1 % — ABNORMAL LOW (ref 36.0–46.0)
Hemoglobin: 9.7 g/dL — ABNORMAL LOW (ref 12.0–15.0)
Immature Granulocytes: 1 %
Lymphocytes Relative: 26 %
Lymphs Abs: 3 10*3/uL (ref 0.7–4.0)
MCH: 27.9 pg (ref 26.0–34.0)
MCHC: 30.2 g/dL (ref 30.0–36.0)
MCV: 92.2 fL (ref 80.0–100.0)
Monocytes Absolute: 0.8 10*3/uL (ref 0.1–1.0)
Monocytes Relative: 6 %
Neutro Abs: 7.6 10*3/uL (ref 1.7–7.7)
Neutrophils Relative %: 65 %
Platelets: 504 10*3/uL — ABNORMAL HIGH (ref 150–400)
RBC: 3.48 MIL/uL — ABNORMAL LOW (ref 3.87–5.11)
RDW: 20.6 % — ABNORMAL HIGH (ref 11.5–15.5)
WBC: 11.7 10*3/uL — ABNORMAL HIGH (ref 4.0–10.5)
nRBC: 0 % (ref 0.0–0.2)

## 2018-11-21 LAB — BASIC METABOLIC PANEL
Anion gap: 6 (ref 5–15)
BUN: 26 mg/dL — ABNORMAL HIGH (ref 8–23)
CO2: 25 mmol/L (ref 22–32)
Calcium: 8.4 mg/dL — ABNORMAL LOW (ref 8.9–10.3)
Chloride: 108 mmol/L (ref 98–111)
Creatinine, Ser: 0.58 mg/dL (ref 0.44–1.00)
GFR calc Af Amer: >60 mL/min (ref >60)
GFR calc non Af Amer: >60 mL/min (ref >60)
Glucose, Bld: 79 mg/dL (ref 70–99)
Potassium: 3.6 mmol/L (ref 3.5–5.1)
Sodium: 139 mmol/L (ref 135–145)

## 2018-11-21 MED ORDER — FAMOTIDINE IN NACL 20-0.9 MG/50ML-% IV SOLN
20.0000 mg | Freq: Once | INTRAVENOUS | Status: AC
  Administered 2018-11-21: 20 mg via INTRAVENOUS
  Filled 2018-11-21: qty 50

## 2018-11-21 MED ORDER — HYDRALAZINE HCL 20 MG/ML IJ SOLN: 10.0000 mg | INTRAMUSCULAR | Status: DC | PRN

## 2018-11-21 NOTE — Consult Note (Signed)
Referring Provider: Dr. Olevia Bowens Primary Care Physician:  Barry Dienes, NP Primary Gastroenterologist:  Dr. Gala Romney   Date of Admission: 11/20/2018 Date of Consultation: 11/21/2018  Reason for Consultation:  Hematemesis   HPI:  Wanda [name] is an  85 y.o. year old female with a history of IDA, undergoing EGD in 2016 by Dr. Gala Romney with large hiatal hernia, otherwise normal. Previously noted multiple [name] lesions in 2014. Capsule study 2013 without explanation for IDA. Last colonoscopy in 2013 with colonic diverticulosis. She was last seen in the office Feb 2019 and doing well. Recent hospitalization   Nov 05, 2018 with profound symptomatic anemia, Hgb 5.9, s/p 2 units PRBCs with improvement to 8 range. Received IV Feraheme as well during admission. GI consultation not requested at that time. Presented to ED with reports of vomiting blood. Hgb 10.3 on admission and 9.7 this morning.   Notes she at slaw, fish sticks, and rice on Sunday. Was doing well. Started to spit up brown contents, regurgitating. Had several episodes of regurgitating then vomited bright red blood, one episode. Continued to have several episodes of regurgitating brown liquid with final episodes dark black. Noted esophageal burning during this time. Had recently been given a prednisone taper for arthritis. Usually takes Protonix every other day. Stool chronically black on iron. When not taking iron, stool is brown. Has had chronic intermittent regurgitating of food at times. No true esophageal dysphagia. No hematochezia.   Ate oatmeal this morning prior to consultation.   Past Medical History:  Diagnosis Date  . Cystocele   . Diverticulosis 10/20/10   Left-sided on colonoscopy by Dr. Gala Romney 10/20/10  . Gastric polyps 10/02/2013  . Gastritis 12/08/10   EGD by Dr. Altha Harm hiatal hernia, duodenal diverticulum, chronic gastritis  . GERD (gastroesophageal reflux disease)   . Heart murmur   . Hiatal hernia 10/02/2013  .  HTN (hypertension)   . IDA (iron deficiency anemia)   . 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 Osteoarthritis   . Radial fracture    (right) Undergoing treatment by Dr. Aline Brochure currently  . Sleep apnea    STOP BANG SCORE 4  . Urge incontinence   . Vitamin D deficiency     Past Surgical History:  Procedure Laterality Date  . ABDOMINAL HYSTERECTOMY    . ANTERIOR AND POSTERIOR REPAIR  05/17/2012   Procedure: ANTERIOR (CYSTOCELE) AND POSTERIOR REPAIR (RECTOCELE);  Surgeon: Jonnie Kind, MD;  Location: [facility];  Service: Gynecology;  Laterality: N/A;  . COLONOSCOPY  02/29/2012   Dr. Gala Romney: colonic diverticulosis, minimal internal hemorrhoids, benign polyp  . ESOPHAGOGASTRODUODENOSCOPY  02/29/2012   Dr. Gala Romney: atonic esophagus, moderate-sized hiatal hernia, fundal gland polyps  . ESOPHAGOGASTRODUODENOSCOPY N/A 10/02/2013   Dr. Gala Romney- multiple Wilhemena Durie gastric polyps- not manipulated  . ESOPHAGOGASTRODUODENOSCOPY N/A 01/14/2015   Dr. Gala Romney: Large hiatal hernia, otherwise normal EGD  . GIVENS CAPSULE STUDY  02/29/2012   No explanation for IDA. Possible extrinsic compression but negative CT.   Marland Kitchen SALPINGOOPHORECTOMY  05/17/2012   Procedure: SALPINGO OOPHERECTOMY;  Surgeon: Jonnie Kind, MD;  Location: [facility];  Service: Gynecology;  Laterality: Bilateral;  . VAGINAL HYSTERECTOMY  05/17/2012   Procedure: HYSTERECTOMY VAGINAL;  Surgeon: Jonnie Kind, MD;  Location: [facility];  Service: Gynecology;  Laterality: N/A;    Prior to Admission medications   Medication Sig Start Date End Date Taking? Authorizing Provider  CALCIUM PO Take 300-600 mg by mouth daily. Has to be cut in half to take  Yes [provider]  ferrous sulfate 325 (65 FE) MG tablet Take 1 tablet (325 mg total) by mouth daily with breakfast. 11/05/18 12/05/18 Yes Shah, Pratik D, DO  lisinopril-hydrochlorothiazide (PRINZIDE,ZESTORETIC) 20-12.5 MG per tablet Take 1 tablet by mouth daily.   Yes [provider]  pantoprazole (PROTONIX) 40 MG tablet Take one tablet 30 minutes before breakfast daily to every other day. Patient taking differently: Take 40 mg by mouth every other day. Take one tablet 30 minutes before breakfast daily to every other day. 09/21/18  Yes Mahala Menghini, PA-C  PREDNISONE PO Take by mouth See admin instructions. 3 daily for 3 days, then take two tablet daily for 3 days, then take 1 tablet daily for 3 days then stop.   Yes [provider]  HYDROcodone-acetaminophen (NORCO/VICODIN) 5-325 MG tablet One tablet every four hours as needed for acute pain.  Limit of five days per [place] statue. Patient not taking: Reported on 11/20/2018 10/21/17   Sanjuana Kava, MD    Current Facility-Administered Medications  Medication Dose Route Frequency Provider Last Rate Last Dose  . 0.9 % NaCl with KCl 40 mEq / L  infusion   Intravenous Continuous Reubin Milan, MD 100 mL/hr at 11/21/18 1400 100 mL/hr at 11/21/18 1400  . acetaminophen (TYLENOL) tablet 650 mg  650 mg Oral Q6H PRN Reubin Milan, MD       Or  . acetaminophen (TYLENOL) suppository 650 mg  650 mg Rectal Q6H PRN Reubin Milan, MD      . ondansetron [facility]) tablet 4 mg  4 mg Oral Q6H PRN Reubin Milan, MD       Or  . ondansetron [facility]) injection 4 mg  4 mg Intravenous Q6H PRN Reubin Milan, MD      . pantoprazole (PROTONIX) injection 40 mg  40 mg Intravenous Q12H Reubin Milan, MD   40 mg at 11/21/18 6213    Allergies as of 11/20/2018 - Review Complete 11/20/2018  Allergen Reaction Noted  . Sulfa antibiotics  06/23/2016  . Naproxen sodium Rash and Other (See Comments)     Family History  Problem Relation Age of Onset  . Hypertension Mother   . Lymphoma Brother   . Anesthesia problems Neg Hx   . Hypotension Neg Hx   . Malignant hyperthermia Neg Hx   . Pseudochol deficiency Neg Hx   . Colon cancer Neg Hx     Social History   Socioeconomic History  . Marital  status: Widowed    Spouse name: Not on file  . Number of children: Not on file  . Years of education: Not on file  . Highest education level: Not on file  Occupational History  . Occupation: Retired    Fish farm manager: RETIRED  Social Needs  . Financial resource strain: Not on file  . Food insecurity:    Worry: Not on file    Inability: Not on file  . Transportation needs:    Medical: Not on file    Non-medical: Not on file  Tobacco Use  . Smoking status: Never Smoker  . Smokeless tobacco: Never Used  . Tobacco comment: Never smoked  Substance and Sexual Activity  . Alcohol use: No    Alcohol/week: 0.0 standard drinks  . Drug use: No  . Sexual activity: Not Currently    Birth control/protection: Surgical  Lifestyle  . Physical activity:    Days per week: Not on file    Minutes per  session: Not on file  . Stress: Not on file  Relationships  . Social connections:    Talks on phone: Not on file    Gets together: Not on file    Attends religious service: Not on file    Active member of club or organization: Not on file    Attends meetings of clubs or organizations: Not on file    Relationship status: Not on file  . Intimate partner violence:    Fear of current or ex partner: Not on file    Emotionally abused: Not on file    Physically abused: Not on file    Forced sexual activity: Not on file  Other Topics Concern  . Not on file  Social History Narrative  . Not on file    Review of Systems: Gen: Denies fever, chills, loss of appetite, change in weight or weight loss CV: Denies chest pain, heart palpitations, syncope, edema  Resp: Denies shortness of breath with rest, cough, wheezing GI: see HPI  GU : Denies urinary burning, urinary frequency, urinary incontinence.  MS: Denies joint pain,swelling, cramping Derm: Denies rash, itching, dry skin Psych: Denies depression, anxiety,confusion, or memory loss Heme: see HPI   Physical Exam: Vital signs in last 24  hours: Temp:  [97.8 F (36.6 C)-98.7 F (37.1 C)] 98 F (36.7 C) (01/06 1200) Pulse Rate:  [56-88] 63 (01/06 1213) Resp:  [11-18] 18 (01/06 1213) BP: (96-155)/(42-90) 155/77 (01/06 1213) SpO2:  [94 %-100 %] 97 % (01/06 1213) Weight:  [54.1 kg-54.4 kg] 54.1 kg (01/06 1200)   General:   Alert,  Well-developed, well-nourished, pleasant and cooperative in NAD Head:  Normocephalic and atraumatic. Eyes:  Sclera clear, no icterus.   Conjunctiva pink. Ears:  Normal auditory acuity. Nose:  No deformity, discharge,  or lesions. Mouth:  No deformity or lesions, dentition normal. Lungs:  Clear throughout to auscultation.    Heart:  S1 S2 present without murmurs  Abdomen:  Soft, nontender and nondistended. No masses, hepatosplenomegaly or hernias noted. Normal bowel sounds, without guarding, and without rebound.   Rectal:  Deferred until time of colonoscopy.   Msk:  Symmetrical without gross deformities. Normal posture. Extremities:  Without  edema. Neurologic:  Alert and  oriented x4 Psych:  Alert and cooperative. Normal mood and affect.  Intake/Output from previous day: 01/05 0701 - 01/06 0700 In: 1050 [IV Piggyback:1050] Out: -  Intake/Output this shift: Total I/O In: 882.8 [I.V.:882.8] Out: -   Lab Results: Recent Labs    11/20/18 2225 11/21/18 0504  WBC 15.0* 11.7*  HGB 10.3* 9.7*  HCT 34.6* 32.1*  PLT 550* 504*   BMET Recent Labs    11/20/18 2225 11/21/18 0504  NA 142 139  K 3.0* 3.6  CL 108 108  CO2 27 25  GLUCOSE 111* 79  BUN 26* 26*  CREATININE 0.52 0.58  CALCIUM 8.9 8.4*   LFT Recent Labs    11/20/18 2225  PROT 5.8*  ALBUMIN 3.1*  AST 24  ALT 46*  ALKPHOS 76  BILITOT 0.4    Impression: Pleasant 85 year old female with long-standing history of IDA that was previously evaluated several years ago including EGD, colonoscopy, and capsule study. Known history of large hiatal hernia, with last EGD in 2016. Interestingly, she was hospitalized for acute on  chronic anemia several weeks ago and received 2 units PRBCs with improvement to 8.1. Now with hematemesis and chronic regurgitating of food. No obvious melena reported or bright red blood per rectum.  Discussed starting with an EGD. She has no lower GI signs/symptoms of concern. Recent hospitalization for profound anemia is concerning. Discussed with patient she would also benefit from establishing care with Hematology to more closely follow labs; she may also benefit from serial IV iron as outpatient.   Plan: Clear liquids today EGD with Dr. Gala Romney on 11/22/2018, possible dilation Recommend PPI daily going forward  Hematology consultation as outpatient to follow closely May need further evaluation via capsule if no obvious upper GI source for IDA   Annitta Needs, PhD, ANP-BC [facility]      LOS: 0 days    11/21/2018, 2:33 PM

## 2018-11-21 NOTE — ED Notes (Signed)
Have given pt a meal tray  

## 2018-11-21 NOTE — Progress Notes (Signed)
PROGRESS NOTE    TERREL MANALO  RFF:[id number] DOB: September 21, 1933 DOA: 11/20/2018 PCP: Barry Dienes, NP   Brief Narrative:  Per HPI: Wanda [name] is a 85 y.o. female with medical history significant of cystocele, diverticulosis, history of gastric polyps, gastritis, GERD, hiatal hernia, multiple gastric erosions, hypertension, iron deficiency anemia, osteoarthritis, history of right radial fracture, history of sleep apnea, urge continence who was brought to the emergency department with complaints of dry heaving, several episodes of emesis, and at least 2 episodes hematemesis/coffee-ground since earlier today.  She states that she has been having epigastric discomfort since she started using prescription ibuprofen for arthritis earlier this week.  By Wednesday, her discomfort was significant enough that she decrease her ibuprofen intake to only once a day from 3 times a day.  She has some heartburn on Thursday and Friday.  She was just feeling relatively well on Saturday.  She was able to eat some fish sticks and rice.  Earlier in the day, the patient has some leftover fish sticks and rice.  However, she states that this did not settle well in her stomach.  She subsequently developed nausea, emesis and abdominal pain.  She denies diarrhea, constipation or hematochezia.  She gets frequent melanotic looking stools due to iron supplementation use.  Fever, chills, but feels fatigue and malaise.  No recent headache, sore throat, rhinorrhea, dyspnea, wheezing or hemoptysis.  She complains of dizziness, but denied chest pain, palpitations, diaphoresis, PND, orthopnea or pitting edema of the lower extremities.  She denies polyuria, polydipsia, polyphagia or blurred vision.  Patient was admitted for hematemesis evaluation in the setting of recent use of prednisone for RA flare.  Assessment & Plan:   Principal Problem:   Hematemesis Active Problems:   Normocytic anemia   GERD   HTN (hypertension)  Sleep apnea   Hypokalemia   Leukocytosis   1. Hematemesis.  Continue on Protonix and monitor repeat CBC in a.m.  Clear liquid diet for now as recommended by GI and n.p.o. after midnight with plans for EGD in a.m. 2. Iron deficiency anemia with recent supplementation and transfusion.  Continue to monitor on repeat CBC and transfuse as needed. 3. GERD.  Continue on PPI. 4. Hypertension.  Currently controlled.  We will hold lisinopril and HCTZ with hydralazine as needed. 5. OSA.  Not currently on CPAP. 6. Hypokalemia.  Currently repleted and will stop IV fluid.  Recheck labs in a.m.  Magnesium within normal limits.   DVT prophylaxis: SCDs Code Status: Full code Family Communication: None at bedside Disposition Plan: Plan for EGD in a.m. per GI and monitor H&H.   Consultants:   None  Procedures:   None  Antimicrobials:   None   Subjective: Patient seen and evaluated today with no new acute complaints or concerns. No acute concerns or events noted overnight.  She has had no further episodes of hematemesis since admission and her hemoglobin levels appear to be stable.  Objective: Vitals:   11/21/18 1030 11/21/18 1130 11/21/18 1200 11/21/18 1213  BP: 107/90 (!) 110/59  (!) 155/77  Pulse: 67 (!) 59  63  Resp: 12 11  18   Temp:   98 F (36.7 C)   TempSrc:   Oral   SpO2: 98% 98%  97%  Weight:   54.1 kg   Height:   5\' 1"  (1.549 m)     Intake/Output Summary (Last 24 hours) at 11/21/2018 1547 Last data filed at 11/21/2018 1400 Gross per 24 hour  Intake  1932.76 ml  Output -  Net 1932.76 ml   Filed Weights   11/20/18 1856 11/21/18 1200  Weight: 54.4 kg 54.1 kg    Examination:  General exam: Appears calm and comfortable  Respiratory system: Clear to auscultation. Respiratory effort normal. Cardiovascular system: S1 & S2 heard, RRR. No JVD, murmurs, rubs, gallops or clicks. No pedal edema. Gastrointestinal system: Abdomen is nondistended, soft and nontender. No  organomegaly or masses felt. Normal bowel sounds heard. Central nervous system: Alert and oriented. No focal neurological deficits. Extremities: Symmetric 5 x 5 power. Skin: No rashes, lesions or ulcers Psychiatry: Judgement and insight appear normal. Mood & affect appropriate.     Data Reviewed: I have personally reviewed following labs and imaging studies  CBC: Recent Labs  Lab 11/20/18 2225 11/21/18 0504  WBC 15.0* 11.7*  NEUTROABS  --  7.6  HGB 10.3* 9.7*  HCT 34.6* 32.1*  MCV 91.8 92.2  PLT 550* 625*   Basic Metabolic Panel: Recent Labs  Lab 11/20/18 2225 11/21/18 0504  NA 142 139  K 3.0* 3.6  CL 108 108  CO2 27 25  GLUCOSE 111* 79  BUN 26* 26*  CREATININE 0.52 0.58  CALCIUM 8.9 8.4*  MG 1.7  --   PHOS 3.7  --    GFR: Estimated Creatinine Clearance: 40.2 mL/min (by C-G formula based on SCr of 0.58 mg/dL). Liver Function Tests: Recent Labs  Lab 11/20/18 2225  AST 24  ALT 46*  ALKPHOS 76  BILITOT 0.4  PROT 5.8*  ALBUMIN 3.1*   No results for input(s): LIPASE, AMYLASE in the last 168 hours. No results for input(s): AMMONIA in the last 168 hours. Coagulation Profile: No results for input(s): INR, PROTIME in the last 168 hours. Cardiac Enzymes: No results for input(s): CKTOTAL, CKMB, CKMBINDEX, TROPONINI in the last 168 hours. BNP (last 3 results) No results for input(s): PROBNP in the last 8760 hours. HbA1C: No results for input(s): HGBA1C in the last 72 hours. CBG: No results for input(s): GLUCAP in the last 168 hours. Lipid Profile: No results for input(s): CHOL, HDL, LDLCALC, TRIG, CHOLHDL, LDLDIRECT in the last 72 hours. Thyroid Function Tests: No results for input(s): TSH, T4TOTAL, FREET4, T3FREE, THYROIDAB in the last 72 hours. Anemia Panel: No results for input(s): VITAMINB12, FOLATE, FERRITIN, TIBC, IRON, RETICCTPCT in the last 72 hours. Sepsis Labs: No results for input(s): PROCALCITON, LATICACIDVEN in the last 168 hours.  No results  found for this or any previous visit (from the past 240 hour(s)).       Radiology Studies: No results found.      Scheduled Meds: . pantoprazole (PROTONIX) IV  40 mg Intravenous Q12H   Continuous Infusions: . 0.9 % NaCl with KCl 40 mEq / L 100 mL/hr (11/21/18 1400)     LOS: 0 days    Time spent: 30 minutes    Rashada Klontz Darleen Crocker, DO [facility] Pager [phone]  If 7PM-7AM, please contact night-coverage [email or web address] Password [facility] 11/21/2018, 3:47 PM

## 2018-11-22 ENCOUNTER — Encounter (HOSPITAL_COMMUNITY): Payer: Self-pay

## 2018-11-22 ENCOUNTER — Observation Stay (HOSPITAL_COMMUNITY): Payer: Medicare Other | Admitting: Anesthesiology

## 2018-11-22 ENCOUNTER — Telehealth: Payer: Self-pay | Admitting: Gastroenterology

## 2018-11-22 ENCOUNTER — Encounter (HOSPITAL_COMMUNITY)
Admission: EM | Disposition: A | Discharge status: Home / Self Care | Payer: Self-pay | Source: Home / Self Care | Attending: Emergency Medicine

## 2018-11-22 DIAGNOSIS — K920 Hematemesis: Secondary | ICD-10-CM

## 2018-11-22 DIAGNOSIS — I10 Essential (primary) hypertension: Secondary | ICD-10-CM | POA: Diagnosis not present

## 2018-11-22 DIAGNOSIS — D509 Iron deficiency anemia, unspecified: Secondary | ICD-10-CM | POA: Diagnosis not present

## 2018-11-22 DIAGNOSIS — K3189 Other diseases of stomach and duodenum: Secondary | ICD-10-CM | POA: Diagnosis not present

## 2018-11-22 DIAGNOSIS — K449 Diaphragmatic hernia without obstruction or gangrene: Secondary | ICD-10-CM

## 2018-11-22 DIAGNOSIS — K254 Chronic or unspecified gastric ulcer with hemorrhage: Secondary | ICD-10-CM | POA: Diagnosis not present

## 2018-11-22 HISTORY — PX: BIOPSY: SHX5522

## 2018-11-22 HISTORY — PX: ESOPHAGOGASTRODUODENOSCOPY (EGD) WITH PROPOFOL: SHX5813

## 2018-11-22 LAB — CBC WITH DIFFERENTIAL/PLATELET
Abs Immature Granulocytes: 0.13 10*3/uL — ABNORMAL HIGH (ref 0.00–0.07)
Basophils Absolute: 0.1 10*3/uL (ref 0.0–0.1)
Basophils Relative: 1 %
Eosinophils Absolute: 0.3 10*3/uL (ref 0.0–0.5)
Eosinophils Relative: 3 %
HCT: 33.6 % — ABNORMAL LOW (ref 36.0–46.0)
Hemoglobin: 9.9 g/dL — ABNORMAL LOW (ref 12.0–15.0)
Immature Granulocytes: 1 %
Lymphocytes Relative: 26 %
Lymphs Abs: 2.5 10*3/uL (ref 0.7–4.0)
MCH: 27.1 pg (ref 26.0–34.0)
MCHC: 29.5 g/dL — ABNORMAL LOW (ref 30.0–36.0)
MCV: 92.1 fL (ref 80.0–100.0)
Monocytes Absolute: 0.6 10*3/uL (ref 0.1–1.0)
Monocytes Relative: 7 %
Neutro Abs: 5.9 10*3/uL (ref 1.7–7.7)
Neutrophils Relative %: 62 %
Platelets: 483 10*3/uL — ABNORMAL HIGH (ref 150–400)
RBC: 3.65 MIL/uL — ABNORMAL LOW (ref 3.87–5.11)
RDW: 20.6 % — ABNORMAL HIGH (ref 11.5–15.5)
WBC: 9.5 10*3/uL (ref 4.0–10.5)
nRBC: 0 % (ref 0.0–0.2)

## 2018-11-22 LAB — BASIC METABOLIC PANEL
Anion gap: 8 (ref 5–15)
BUN: 17 mg/dL (ref 8–23)
CO2: 24 mmol/L (ref 22–32)
Calcium: 8.4 mg/dL — ABNORMAL LOW (ref 8.9–10.3)
Chloride: 105 mmol/L (ref 98–111)
Creatinine, Ser: 0.62 mg/dL (ref 0.44–1.00)
GFR calc Af Amer: >60 mL/min (ref >60)
GFR calc non Af Amer: >60 mL/min (ref >60)
Glucose, Bld: 89 mg/dL (ref 70–99)
Potassium: 3.6 mmol/L (ref 3.5–5.1)
Sodium: 137 mmol/L (ref 135–145)

## 2018-11-22 LAB — MRSA PCR SCREENING: MRSA by PCR: NEGATIVE

## 2018-11-22 SURGERY — ESOPHAGOGASTRODUODENOSCOPY (EGD) WITH PROPOFOL
Anesthesia: Monitor Anesthesia Care

## 2018-11-22 MED ORDER — PANTOPRAZOLE SODIUM 40 MG PO TBEC
40.0000 mg | DELAYED_RELEASE_TABLET | Freq: Two times a day (BID) | ORAL | Status: DC
  Administered 2018-11-22: 40 mg via ORAL
  Filled 2018-11-22: qty 1

## 2018-11-22 MED ORDER — HYDROCODONE-ACETAMINOPHEN 7.5-325 MG PO TABS: 1.0000 | ORAL_TABLET | Freq: Once | ORAL | Status: DC | PRN

## 2018-11-22 MED ORDER — GLYCOPYRROLATE 0.2 MG/ML IJ SOLN
INTRAMUSCULAR | Status: DC | PRN
  Administered 2018-11-22: 0.2 mg via INTRAVENOUS

## 2018-11-22 MED ORDER — SODIUM CHLORIDE 0.9 % IV SOLN: INTRAVENOUS | Status: DC

## 2018-11-22 MED ORDER — KETAMINE HCL 50 MG/5ML IJ SOSY
PREFILLED_SYRINGE | INTRAMUSCULAR | Status: AC
  Filled 2018-11-22: qty 5

## 2018-11-22 MED ORDER — LACTATED RINGERS IV SOLN: INTRAVENOUS | Status: DC

## 2018-11-22 MED ORDER — HYDROMORPHONE HCL 1 MG/ML IJ SOLN: 0.2500 mg | INTRAMUSCULAR | Status: DC | PRN

## 2018-11-22 MED ORDER — KETAMINE HCL 10 MG/ML IJ SOLN
INTRAMUSCULAR | Status: DC | PRN
  Administered 2018-11-22: 10 mg via INTRAVENOUS

## 2018-11-22 MED ORDER — PROPOFOL 500 MG/50ML IV EMUL
INTRAVENOUS | Status: DC | PRN
  Administered 2018-11-22: 150 ug/kg/min via INTRAVENOUS

## 2018-11-22 MED ORDER — PROMETHAZINE HCL 25 MG/ML IJ SOLN: 6.2500 mg | INTRAMUSCULAR | Status: DC | PRN

## 2018-11-22 MED ORDER — PANTOPRAZOLE SODIUM 40 MG PO TBEC: 40.0000 mg | DELAYED_RELEASE_TABLET | Freq: Two times a day (BID) | ORAL | 3 refills | Status: DC

## 2018-11-22 MED ORDER — MEPERIDINE HCL 100 MG/ML IJ SOLN: 6.2500 mg | INTRAMUSCULAR | Status: DC | PRN

## 2018-11-22 MED ORDER — LACTATED RINGERS IV SOLN
INTRAVENOUS | Status: DC
  Administered 2018-11-22: 10:00:00 via INTRAVENOUS

## 2018-11-22 NOTE — Anesthesia Postprocedure Evaluation (Signed)
Anesthesia Post Note  Patient: Wanda [name]  Procedure(s) Performed: ESOPHAGOGASTRODUODENOSCOPY (EGD) WITH PROPOFOL (N/A ) BIOPSY  Patient location during evaluation: PACU Anesthesia Type: MAC Level of consciousness: awake and alert and oriented Pain management: pain level controlled Vital Signs Assessment: post-procedure vital signs reviewed and stable Respiratory status: spontaneous breathing Cardiovascular status: stable Postop Assessment: no apparent nausea or vomiting Anesthetic complications: no     Last Vitals:  Vitals:   11/22/18 0800 11/22/18 0937  BP:  128/65  Pulse:  60  Resp:    Temp: 36.9 C 37 C  SpO2:  96%    Last Pain:  Vitals:   11/22/18 0937  TempSrc: Oral  PainSc: 0-No pain                 Harlow Basley A

## 2018-11-22 NOTE — Anesthesia Procedure Notes (Signed)
Procedure Name: [place] Performed by: Andree Elk Bernis Stecher A, CRNA Pre-anesthesia Checklist: Patient identified, Emergency Drugs available, Suction available, Timeout performed and Patient being monitored Patient Re-evaluated:Patient Re-evaluated prior to induction Oxygen Delivery Method: Non-rebreather mask

## 2018-11-22 NOTE — Op Note (Signed)
[facility] Patient Name: Wanda [name] Procedure Date: 11/22/2018 10:39 AM MRN: [id number] Date of Birth: 02/18/1933 Attending MD: Norvel Richards , MD CSN: [id number] Age: 85 Admit Type: Inpatient Procedure:                Upper GI endoscopy Indications:              Hematemesis Providers:                Norvel Richards, MD, Janeece Riggers, RN, Rosina Lowenstein, RN Referring MD:              Medicines:                Propofol per Anesthesia Complications:            No immediate complications. Estimated Blood Loss:     Estimated blood loss was minimal. Procedure:                After obtaining informed consent, the endoscope was                            passed under direct vision. Throughout the                            procedure, the patient's blood pressure, pulse, and                            oxygen saturations were monitored continuously. The                            [id number]-[id number] ([id number]) was introduced through the and                            advanced to the second part of duodenum. The upper                            GI endoscopy was accomplished without difficulty.                            The patient tolerated the procedure well. Scope In: 10:49:37 AM Scope Out: 10:58:45 AM Total Procedure Duration: 0 hours 9 minutes 8 seconds  Findings:      The examined esophagus was normal.      A large hiatal hernia was present.      Multiple localized erosions were found in the gastric fundus. Multiple       linear erosions straddling the diaphragmatic hiatus. Scattered antral       erosions. No ulcer or infiltrating process.      The duodenal bulb and second portion of the duodenum were normal. This       was biopsied with a cold forceps for histology. Estimated blood loss was       minimal. Impression:               - Normal esophagus.                           -  Large hiatal hernia.                           - Erosive gastropathy.  Biopsied                           - Normal duodenal bulb and second portion of the                            duodenum. Anemia likely multifactorial in etiology. Moderate Sedation:      Moderate (conscious) sedation was personally administered by an       anesthesia professional. The following parameters were monitored: oxygen       saturation, heart rate, blood pressure, respiratory rate, EKG, adequacy       of pulmonary ventilation, and response to care.      The administration of moderate sedation was initiated. Recommendation:           - Return patient to hospital ward for ongoing care.                           - Advance diet as tolerated.                           - Continue present medications. Begin Protonix 40                            mg twice daily. No further GI evaluation at this                            time. Minimize NSAID use. Follow-up with hematology.                           - No repeat upper endoscopy.                           - Return to GI office in 6 weeks. Procedure Code(s):        --- Professional ---                           [phone], Esophagogastroduodenoscopy, flexible,                            transoral; with biopsy, single or multiple Diagnosis Code(s):        --- Professional ---                           K44.9, Diaphragmatic hernia without obstruction or                            gangrene                           K31.89, Other diseases of stomach and duodenum                           K92.0, Hematemesis CPT copyright 2018  American Medical Association. All rights reserved. The codes documented in this report are preliminary and upon coder review may  be revised to meet current compliance requirements. [name]. [name], MD Norvel Richards, MD 11/22/2018 11:18:47 AM This report has been signed electronically. Number of Addenda: 0

## 2018-11-22 NOTE — Discharge Summary (Signed)
Physician Discharge Summary  [name] HCW:[id number] DOB: 09/04/1933 DOA: 11/20/2018  PCP: Barry Dienes, NP  Admit date: 11/20/2018  Discharge date: 11/22/2018  Admitted From:Home  Disposition:  Home  Recommendations for Outpatient Follow-up:  1. Follow up with PCP in 1-2 weeks 2. Continue on PPI twice daily as prescribed and follow-up with GI in 6 weeks 3. Follow-up with hematology 4. Avoid NSAIDs  Home Health: None  Equipment/Devices: None  Discharge Condition: Stable  CODE STATUS: Full  Diet recommendation: Heart Healthy  Brief/Interim Summary: Per HPI: Wanda [name] a 85 y.o.femalewith medical history significant ofcystocele, diverticulosis, history of gastric polyps, gastritis, GERD, hiatal hernia, multiple gastric erosions, hypertension, iron deficiency anemia, osteoarthritis, history of right radial fracture, history of sleep apnea, urge continence who was brought to the emergency department with complaints ofdry heaving, several episodes of emesis, and at least 2 episodeshematemesis/coffee-ground since earlier today. She states that she has been having epigastric discomfort since she started using prescription ibuprofen for arthritis earlier this week. By Wednesday, her discomfort was significant enough that she decrease her ibuprofen intake to only once a day from 3 times a day. She has some heartburn on Thursday and Friday. She was just feeling relatively well on Saturday. She was able to eat some fish sticks and rice. Earlier in the day, the patient has some leftover fish sticks and rice. However, she states that this did not settle well in her stomach. She subsequently developed nausea, emesis and abdominal pain. She denies diarrhea, constipation or hematochezia. She gets frequent melanotic looking stools due to iron supplementation use. Fever, chills, but feels fatigue and malaise. No recent headache, sore throat, rhinorrhea, dyspnea, wheezing or  hemoptysis. She complains of dizziness, but denied chest pain, palpitations, diaphoresis, PND, orthopnea or pitting edema of the lower extremities. She denies polyuria, polydipsia, polyphagia or blurred vision.  Patient was admitted for hematemesis evaluation in the setting of recent use of prednisone for RA flare.  She did not have any further episodes of hematemesis or epigastric pain during her inpatient admission and was seen by GI and underwent EGD on 1/7 with noted erosive gastropathy that was biopsied as well as large hiatal hernia.  No active bleed noted.  She was recommended to remain off NSAIDs and further steroids for the time being and continue on PPI twice daily which has been prescribed for her with close follow-up to be arranged in 6 weeks with GI as well as with hematology.  No other acute events have been noted during her inpatient stay.  Her hemoglobin on day of discharge is stable at 9.9.  Discharge Diagnoses:  Principal Problem:   Hematemesis Active Problems:   Normocytic anemia   GERD   HTN (hypertension)   Sleep apnea   Hypokalemia   Leukocytosis  Principal discharge diagnosis: Hematemesis secondary to erosive gastropathy-resolved.  Discharge Instructions  Discharge Instructions    Diet - low sodium heart healthy   Complete by:  As directed    Increase activity slowly   Complete by:  As directed      Allergies as of 11/22/2018      Reactions   Sulfa Antibiotics    unknown   Naproxen Sodium Rash, Other (See Comments)   Passed out      Medication List    STOP taking these medications   PREDNISONE PO     TAKE these medications   CALCIUM PO Take 300-600 mg by mouth daily. Has to be cut in half to  take   ferrous sulfate 325 (65 FE) MG tablet Take 1 tablet (325 mg total) by mouth daily with breakfast.   HYDROcodone-acetaminophen 5-325 MG tablet Commonly known as:  NORCO/VICODIN One tablet every four hours as needed for acute pain.  Limit of five days  per [place].   lisinopril-hydrochlorothiazide 20-12.5 MG tablet Commonly known as:  PRINZIDE,ZESTORETIC Take 1 tablet by mouth daily.   pantoprazole 40 MG tablet Commonly known as:  PROTONIX Take 1 tablet (40 mg total) by mouth 2 (two) times daily. What changed:    how much to take  how to take this  when to take this  additional instructions      Follow-up Information    Barry Dienes, NP Follow up in 2 week(s).   Specialty:  Nurse Practitioner Contact information: [place] [address] [phone]        Daneil Dolin, MD. Schedule an appointment as soon as possible for a visit in 6 week(s).   Specialty:  Gastroenterology Contact information: [place] [address] [phone]          Allergies  Allergen Reactions  . Sulfa Antibiotics     unknown  . Naproxen Sodium Rash and Other (See Comments)    Passed out    Consultations:  GI   Procedures/Studies: Dg Wrist Complete Left  Result Date: 11/05/2018 CLINICAL DATA:  [place], tripped over dog.  Pain. EXAM: LEFT WRIST - COMPLETE 3+ VIEW COMPARISON:  None. FINDINGS: No acute fracture deformity or dislocation. Moderate first MTP joint space narrowing with periarticular sclerosis and marginal spurring compatible with osteoarthrosis. Fluffy intra-articular calcifications consistent with CPPD. Base of fifth metacarpus exostosis. Osteopenia without destructive bony lesions. Mild wrist soft tissue swelling without subcutaneous gas or radiopaque foreign bodies. IMPRESSION: 1. No acute fracture deformity or dislocation. Mild soft tissue swelling. 2. Moderate first MTP osteoarthrosis. Electronically Signed   By: Elon Alas M.D.   On: 11/05/2018 00:31   Ct Hip Left Wo Contrast  Result Date: 11/05/2018 CLINICAL DATA:  Status post fall, with left hip pain. Initial encounter. EXAM: CT OF THE LEFT HIP WITHOUT CONTRAST TECHNIQUE: Multidetector CT imaging of the left hip was  performed according to the standard protocol. Multiplanar CT image reconstructions were also generated. COMPARISON:  Left hip radiographs performed 11/04/2018, and CT of the abdomen and pelvis performed 03/25/2012 FINDINGS: Bones/Joint/Cartilage No definite fracture is seen at the left femoral neck. The left femoral head remains seated at the acetabulum. The visualized osseous structures appear grossly intact. The cartilage is not well assessed on CT. No significant joint effusion is seen. Ligaments Suboptimally assessed by CT. Muscles and Tendons The visualized musculature is grossly unremarkable. The visualized tendon structures are within normal limits. Soft tissues Diffuse diverticulosis is noted along the visualized descending and sigmoid colon, without evidence of diverticulitis. Scattered vascular calcifications are seen. IMPRESSION: 1. No definite fracture seen at the left femoral neck on CT. Depending on the degree of clinical concern, MRI could be considered to exclude occult fracture. 2. Diffuse diverticulosis along the visualized descending and sigmoid colon, without evidence of diverticulitis. 3. Scattered vascular calcifications seen. Electronically Signed   By: Garald Balding M.D.   On: 11/05/2018 02:01   Dg Hip Unilat W Or Wo Pelvis 2-3 Views Left  Result Date: 11/05/2018 CLINICAL DATA:  Status post fall, with acute onset of left hip pain. Initial encounter. EXAM: DG HIP (WITH OR WITHOUT PELVIS) 2-3V LEFT COMPARISON:  None. FINDINGS: There  is a suspected minimally displaced transcervical fracture through the left femoral neck. The left femoral head remains seated at the acetabulum. The right hip joint is grossly unremarkable. Scattered vascular calcifications are seen. IMPRESSION: 1. Suspect minimally displaced transcervical fracture through the left femoral neck. 2. Scattered vascular calcifications seen. Electronically Signed   By: Garald Balding M.D.   On: 11/05/2018 00:33     Discharge  Exam: Vitals:   11/22/18 0937 11/22/18 1108  BP: 128/65 107/72  Pulse: 60 94  Resp:  16  Temp: 98.6 F (37 C) 98.5 F (36.9 C)  SpO2: 96% 94%   Vitals:   11/22/18 0500 11/22/18 0800 11/22/18 0937 11/22/18 1108  BP:   128/65 107/72  Pulse:   60 94  Resp:    16  Temp:  98.4 F (36.9 C) 98.6 F (37 C) 98.5 F (36.9 C)  TempSrc:  Oral Oral   SpO2:   96% 94%  Weight: 54.6 kg     Height:        General: Pt is alert, awake, not in acute distress Cardiovascular: RRR, S1/S2 +, no rubs, no gallops Respiratory: CTA bilaterally, no wheezing, no rhonchi Abdominal: Soft, NT, ND, bowel sounds + Extremities: no edema, no cyanosis    The results of significant diagnostics from this hospitalization (including imaging, microbiology, ancillary and laboratory) are listed below for reference.     Microbiology: Recent Results (from the past 240 hour(s))  MRSA PCR Screening     Status: None   Collection Time: 11/21/18 12:04 PM  Result Value Ref Range Status   MRSA by PCR NEGATIVE NEGATIVE Final    Comment:        The GeneXpert MRSA Assay (FDA approved for NASAL specimens only), is one component of a comprehensive MRSA colonization surveillance program. It is not intended to diagnose MRSA infection nor to guide or monitor treatment for MRSA infections. Performed at [facility], [address] [place], [place] [address]      Labs: BNP (last 3 results) No results for input(s): BNP in the last 8760 hours. Basic Metabolic Panel: Recent Labs  Lab 11/20/18 2225 11/21/18 0504 11/22/18 0350  NA 142 139 137  K 3.0* 3.6 3.6  CL 108 108 105  CO2 27 25 24   GLUCOSE 111* 79 89  BUN 26* 26* 17  CREATININE 0.52 0.58 0.62  CALCIUM 8.9 8.4* 8.4*  MG 1.7  --   --   PHOS 3.7  --   --    Liver Function Tests: Recent Labs  Lab 11/20/18 2225  AST 24  ALT 46*  ALKPHOS 76  BILITOT 0.4  PROT 5.8*  ALBUMIN 3.1*   No results for input(s): LIPASE, AMYLASE in the last 168 hours. No  results for input(s): AMMONIA in the last 168 hours. CBC: Recent Labs  Lab 11/20/18 2225 11/21/18 0504 11/22/18 0350  WBC 15.0* 11.7* 9.5  NEUTROABS  --  7.6 5.9  HGB 10.3* 9.7* 9.9*  HCT 34.6* 32.1* 33.6*  MCV 91.8 92.2 92.1  PLT 550* 504* 483*   Cardiac Enzymes: No results for input(s): CKTOTAL, CKMB, CKMBINDEX, TROPONINI in the last 168 hours. BNP: Invalid input(s): POCBNP CBG: No results for input(s): GLUCAP in the last 168 hours. D-Dimer No results for input(s): DDIMER in the last 72 hours. Hgb A1c No results for input(s): HGBA1C in the last 72 hours. Lipid Profile No results for input(s): CHOL, HDL, LDLCALC, TRIG, CHOLHDL, LDLDIRECT in the last 72 hours. Thyroid function studies  No results for input(s): TSH, T4TOTAL, T3FREE, THYROIDAB in the last 72 hours.  Invalid input(s): FREET3 Anemia work up No results for input(s): VITAMINB12, FOLATE, FERRITIN, TIBC, IRON, RETICCTPCT in the last 72 hours. Urinalysis    Component Value Date/Time   COLORURINE YELLOW 11/05/2018 [place] 11/05/2018 1029   LABSPEC 1.008 11/05/2018 1029   PHURINE 8.0 11/05/2018 1029   GLUCOSEU NEGATIVE 11/05/2018 1029   HGBUR NEGATIVE 11/05/2018 1029   BILIRUBINUR NEGATIVE 11/05/2018 1029   KETONESUR NEGATIVE 11/05/2018 1029   PROTEINUR NEGATIVE 11/05/2018 1029   UROBILINOGEN 0.2 05/11/2012 1000   NITRITE NEGATIVE 11/05/2018 1029   LEUKOCYTESUR NEGATIVE 11/05/2018 1029   Sepsis Labs Invalid input(s): PROCALCITONIN,  WBC,  LACTICIDVEN Microbiology Recent Results (from the past 240 hour(s))  MRSA PCR Screening     Status: None   Collection Time: 11/21/18 12:04 PM  Result Value Ref Range Status   MRSA by PCR NEGATIVE NEGATIVE Final    Comment:        The GeneXpert MRSA Assay (FDA approved for NASAL specimens only), is one component of a comprehensive MRSA colonization surveillance program. It is not intended to diagnose MRSA infection nor to guide or monitor  treatment for MRSA infections. Performed at [facility], [address] [place], [place] [address]      Time coordinating discharge: 35 minutes  SIGNED:   Rodena Goldmann, DO [facility] 11/22/2018, 1:52 PM Pager [phone]  If 7PM-7AM, please contact night-coverage [email or web address] Password [email or web address]

## 2018-11-22 NOTE — Transfer of Care (Signed)
Immediate Anesthesia Transfer of Care Note  Patient: Wanda [name]  Procedure(s) Performed: ESOPHAGOGASTRODUODENOSCOPY (EGD) WITH PROPOFOL (N/A ) BIOPSY  Patient Location: PACU  Anesthesia Type:MAC  Level of Consciousness: awake, alert , oriented and patient cooperative  Airway & Oxygen Therapy: Patient Spontanous Breathing  Post-op Assessment: Report given to RN and Post -op Vital signs reviewed and stable  Post vital signs: Reviewed and stable  Last Vitals:  Vitals Value Taken Time  BP 107/72 11/22/2018 11:07 AM  Temp    Pulse 95 11/22/2018 11:09 AM  Resp 29 11/22/2018 11:09 AM  SpO2 93 % 11/22/2018 11:09 AM  Vitals shown include unvalidated device data.  Last Pain:  Vitals:   11/22/18 0937  TempSrc: Oral  PainSc: 0-No pain         Complications: No apparent anesthesia complications

## 2018-11-22 NOTE — Progress Notes (Signed)
NPO except meds for EGD +/- dilation with Propofol by Dr. Gala Romney today. Hgb stable at 9.9. Patient aware of upcoming procedure. Annitta Needs, PhD, ANP-BC [facility]

## 2018-11-22 NOTE — Anesthesia Preprocedure Evaluation (Signed)
Anesthesia Evaluation    Airway Mallampati: II       Dental  (+) Teeth Intact, Dental Advidsory Given   Pulmonary sleep apnea ,    breath sounds clear to auscultation       Cardiovascular hypertension, + Valvular Problems/Murmurs  Rhythm:regular     Neuro/Psych    GI/Hepatic hiatal hernia, PUD, GERD  ,  Endo/Other    Renal/GU      Musculoskeletal   Abdominal   Peds  Hematology  (+) Blood dyscrasia, anemia ,   Anesthesia Other Findings Denies any sig PMH, cv, pulm, endo, neuro  Reproductive/Obstetrics                             Anesthesia Physical Anesthesia Plan  ASA: III  Anesthesia Plan: MAC   Post-op Pain Management:    Induction:   PONV Risk Score and Plan:   Airway Management Planned:   Additional Equipment:   Intra-op Plan:   Post-operative Plan:   Informed Consent: I have reviewed the patients History and Physical, chart, labs and discussed the procedure including the risks, benefits and alternatives for the proposed anesthesia with the patient or authorized representative who has indicated his/her understanding and acceptance.   Dental Advisory Given  Plan Discussed with: Anesthesiologist  Anesthesia Plan Comments:         Anesthesia Quick Evaluation

## 2018-11-22 NOTE — Telephone Encounter (Addendum)
Stacey: please arrange hospital follow-up in 2-4 weeks, may use urgent with me. Patient requesting to see me, but if unable, let me know.  [facility] clinical pool: please refer to Hematology due to chronic IDA.

## 2018-11-22 NOTE — Telephone Encounter (Signed)
Referral sent 

## 2018-11-23 ENCOUNTER — Encounter: Payer: Self-pay | Admitting: Internal Medicine

## 2018-11-23 NOTE — Telephone Encounter (Signed)
PATIENT SCHEDULED AND LETTER SENT  °

## 2018-11-24 ENCOUNTER — Encounter: Payer: Self-pay | Admitting: Internal Medicine

## 2018-11-25 ENCOUNTER — Encounter (HOSPITAL_COMMUNITY): Payer: Self-pay | Admitting: Internal Medicine

## 2018-11-29 ENCOUNTER — Encounter (HOSPITAL_COMMUNITY): Payer: Self-pay | Admitting: Internal Medicine

## 2018-11-29 ENCOUNTER — Inpatient Hospital Stay (HOSPITAL_COMMUNITY): Payer: Medicare Other | Attending: Internal Medicine | Admitting: Internal Medicine

## 2018-11-29 ENCOUNTER — Other Ambulatory Visit: Payer: Self-pay

## 2018-11-29 VITALS — BP 153/73 | HR 74 | Temp 97.7°F | Resp 16 | Ht 60.0 in | Wt 117.0 lb

## 2018-11-29 DIAGNOSIS — E538 Deficiency of other specified B group vitamins: Secondary | ICD-10-CM | POA: Insufficient documentation

## 2018-11-29 DIAGNOSIS — Z79899 Other long term (current) drug therapy: Secondary | ICD-10-CM

## 2018-11-29 DIAGNOSIS — D509 Iron deficiency anemia, unspecified: Secondary | ICD-10-CM | POA: Insufficient documentation

## 2018-11-29 DIAGNOSIS — D508 Other iron deficiency anemias: Secondary | ICD-10-CM

## 2018-11-29 DIAGNOSIS — M2550 Pain in unspecified joint: Secondary | ICD-10-CM | POA: Diagnosis not present

## 2018-11-29 NOTE — Progress Notes (Signed)
Referring Physician:  [facility] Dr. Gala Romney Diagnosis Other iron deficiency anemia - Plan: CBC with Differential/Platelet, Comprehensive metabolic panel, Lactate dehydrogenase, Protein electrophoresis, serum, Ferritin, Haptoglobin, Vitamin B12, Folate, Methylmalonic acid, serum  Staging Cancer Staging No matching staging information was found for the patient.  Assessment and Plan:  1.  Iron deficiency anemia.  85 year old female referred for evaluation of anemia.  She reports 5 years ago she had an episode of anemia that required blood transfusion.  She denies any blood in stool or urine.  Pt reports she developed joint pain in left had and was placed on prednisone by PCP.  She subsequently developed abdominal pain and was seen at ER with labs done on 11/04/2018 showing WBC 9.8 HB 5.9 Plts 440,000.  Chemistries WNL with K+ 3.4 Cr 0.69 and normal LFTs.  Ferritin was decreased at 7.  Pt was transfused with 2 units PRBCs with improvement to 8 range. Received IV Feraheme as well during admission. Pt was recently hospitalized 11/20/2018 to 11/22/2018 for anemia.  Pt had EGD in 2016 by Dr. Gala Romney with large hiatal hernia, otherwise normal. Previously noted multiple [name] lesions in 2014. Capsule study 2013 without explanation for IDA. Last colonoscopy in 2013 with colonic diverticulosis.   Pt has undergone EGD 11/22/2018 that showed   - Normal esophagus. - Large hiatal hernia. - Erosive gastropathy. Biopsied - Normal duodenal bulb and second portion of the duodenum. Anemia likely multifactorial in etiology.  She reports she is feeling better since hospitalization.  She had recent blood work done.  Pt denies any family history of blood disorders.  Pt denies bleeding or bruising.  Pt is seen today for consultation due to iron deficiency anemia  Pt had recent labs done 11/22/2018 that showed WBC 9.5 HB 9.9 plts 483,000.  Chemistries WNL with K+ 3.6 Cr 0.62.  I discussed with her HB has improved to 9.9 after  recent transfusion and IV iron.  Pt will have repeat labs in 2 weeks and will also check SPEP, B12, folate, MMA, CBC, CMP, haptoglobin and LDH.  She will RTC in 12/2018 to go over results.  Pt has undergone GI evaluation and should follow-up with GI as directed.    2.  B12 Deficiency.  Will check B12 levels and recommend treatment pending results.    3.  Joint pain.  Will check SPEP in 2 weeks based on anemia history.    4.  Health maintenance.  Pt had GI evaluation on 11/22/2018.  Pt should follow-up with GI as recommended.    40 minutes spent with more than 50% spent in review of records, counseling and coordination of care.      HPI:  85 year old female referred for evaluation of anemia.  She reports 5 years ago she had an episode of anemia that required blood transfusion.  She denies any blood in stool or urine.  Pt reports she developed joint pain in left had and was placed on prednisone by PCP.  She subsequently developed abdominal pain and was seen at ER with labs done on 11/04/2018 showing WBC 9.8 HB 5.9 Plts 440,000.  Chemistries WNL with K+ 3.4 Cr 0.69 and normal LFTs.  Ferritin was decreased at 7.  Pt was transfused with 2 units PRBCs with improvement to 8 range. Received IV Feraheme as well during admission.   Pt had EGD in 2016 by Dr. Gala Romney with large hiatal hernia, otherwise normal. Previously noted multiple [name] lesions in 2014. Capsule study 2013 without  explanation for IDA. Last colonoscopy in 2013 with colonic diverticulosis.   Pt has undergone EGD 11/22/2018 that showed   - Normal esophagus. - Large hiatal hernia. - Erosive gastropathy. Biopsied - Normal duodenal bulb and second portion of the duodenum. Anemia likely multifactorial in etiology.  She reports she is feeling better since hospitalization.  She had recent blood work done.  Pt denies any family history of blood disorders.  Pt denies bleeding or bruising.  Pt is seen today for consultation due to iron deficiency  anemia   Problem List Patient Active Problem List   Diagnosis Date Noted  . Sleep apnea [G47.30] 11/20/2018  . Hypokalemia [E87.6] 11/20/2018  . Leukocytosis [D72.829] 11/20/2018  . Symptomatic anemia [D64.9] 11/04/2018  . Diarrhea, functional [K59.1] 03/15/2015  . Fatigue [R53.83] 12/28/2014  . Upper abdominal pain [R10.10] 12/28/2014  . B12 deficiency [E53.8] 12/28/2014    Class: History of  . Acute blood loss anemia [D62] 10/02/2013  . GI bleed [K92.2] 10/02/2013  . Melena [K92.1] 10/02/2013  . Hematemesis [K92.0] 10/02/2013  . Sinus tachycardia [R00.0] 10/02/2013  . Multiple gastric erosions [K25.9] 10/02/2013  . Hiatal hernia [K44.9] 10/02/2013  . Gastric polyps [K31.7] 10/02/2013  . HTN (hypertension) [I10]   . IDA (iron deficiency anemia) [D50.9]   . Adhesive capsulitis of right shoulder [M75.01] 03/01/2012  . Iron deficiency anemia [D50.9] 02/02/2012  . Distal radius fracture [S52.509A] 11/30/2011  . Diverticulosis [K57.90] 10/20/2010  . Normocytic anemia [D64.9] 10/01/2010  . GERD [K21.9] 10/01/2010  . FECAL OCCULT BLOOD [R19.5] 10/01/2010    Past Medical History Past Medical History:  Diagnosis Date  . Anemia   . Cystocele   . Diverticulosis 10/20/10   Left-sided on colonoscopy by Dr. Gala Romney 10/20/10  . Gastric polyps 10/02/2013  . Gastritis 12/08/10   EGD by Dr. Altha Harm hiatal hernia, duodenal diverticulum, chronic gastritis  . GERD (gastroesophageal reflux disease)   . Heart murmur   . Hiatal hernia 10/02/2013  . HTN (hypertension)   . IDA (iron deficiency anemia)   . 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 Osteoarthritis   . Radial fracture    (right) Undergoing treatment by Dr. Aline Brochure currently  . Sleep apnea    STOP BANG SCORE 4  . Urge incontinence   . Vitamin D deficiency     Past Surgical History Past Surgical History:  Procedure Laterality Date  . ABDOMINAL HYSTERECTOMY    . ANTERIOR AND POSTERIOR REPAIR  05/17/2012   Procedure:  ANTERIOR (CYSTOCELE) AND POSTERIOR REPAIR (RECTOCELE);  Surgeon: Jonnie Kind, MD;  Location: [facility];  Service: Gynecology;  Laterality: N/A;  . BIOPSY  11/22/2018   Procedure: BIOPSY;  Surgeon: Daneil Dolin, MD;  Location: [facility];  Service: Endoscopy;;  . COLONOSCOPY  02/29/2012   Dr. Gala Romney: colonic diverticulosis, minimal internal hemorrhoids, benign polyp  . ESOPHAGOGASTRODUODENOSCOPY  02/29/2012   Dr. Gala Romney: atonic esophagus, moderate-sized hiatal hernia, fundal gland polyps  . ESOPHAGOGASTRODUODENOSCOPY N/A 10/02/2013   Dr. Gala Romney- multiple Wilhemena Durie gastric polyps- not manipulated  . ESOPHAGOGASTRODUODENOSCOPY N/A 01/14/2015   Dr. Gala Romney: Large hiatal hernia, otherwise normal EGD  . ESOPHAGOGASTRODUODENOSCOPY (EGD) WITH PROPOFOL N/A 11/22/2018   Procedure: ESOPHAGOGASTRODUODENOSCOPY (EGD) WITH PROPOFOL;  Surgeon: Daneil Dolin, MD;  Location: [facility];  Service: Endoscopy;  Laterality: N/A;  possible dilation   . GIVENS CAPSULE STUDY  02/29/2012   No explanation for IDA. Possible extrinsic compression but negative CT.   Marland Kitchen SALPINGOOPHORECTOMY  05/17/2012   Procedure: SALPINGO OOPHERECTOMY;  Surgeon: Jonnie Kind, MD;  Location: [facility];  Service: Gynecology;  Laterality: Bilateral;  . VAGINAL HYSTERECTOMY  05/17/2012   Procedure: HYSTERECTOMY VAGINAL;  Surgeon: Jonnie Kind, MD;  Location: [facility];  Service: Gynecology;  Laterality: N/A;    Family History Family History  Problem Relation Age of Onset  . Hypertension Mother   . Lymphoma Brother   . Anesthesia problems Neg Hx   . Hypotension Neg Hx   . Malignant hyperthermia Neg Hx   . Pseudochol deficiency Neg Hx   . Colon cancer Neg Hx      Social History  reports that she has never smoked. She has never used smokeless tobacco. She reports that she does not drink alcohol or use drugs.  Medications  Current Outpatient Medications:  .  CALCIUM PO, Take 300-600 mg by mouth daily. Has to be cut in half to  take, Disp: , Rfl:  .  ferrous sulfate 325 (65 FE) MG tablet, Take 1 tablet (325 mg total) by mouth daily with breakfast., Disp: 30 tablet, Rfl: 3 .  lisinopril-hydrochlorothiazide (PRINZIDE,ZESTORETIC) 20-12.5 MG per tablet, Take 1 tablet by mouth daily., Disp: , Rfl:  .  pantoprazole (PROTONIX) 40 MG tablet, Take 1 tablet (40 mg total) by mouth 2 (two) times daily., Disp: 60 tablet, Rfl: 3  Allergies Sulfa antibiotics and Naproxen sodium  Review of Systems Review of Systems - Oncology ROS negative  Physical Exam  Vitals Wt Readings from Last 3 Encounters:  11/29/18 117 lb (53.1 kg)  11/22/18 120 lb 5.9 oz (54.6 kg)  11/04/18 120 lb (54.4 kg)   Temp Readings from Last 3 Encounters:  11/29/18 97.7 F (36.5 C) (Oral)  11/22/18 98.5 F (36.9 C)  11/05/18 98 F (36.7 C) (Oral)   BP Readings from Last 3 Encounters:  11/29/18 (!) 153/73  11/22/18 107/72  11/05/18 123/60   Pulse Readings from Last 3 Encounters:  11/29/18 74  11/22/18 94  11/05/18 72   Constitutional: Well-developed, well-nourished, and in no distress.   HENT: Head: Normocephalic and atraumatic.  Mouth/Throat: No oropharyngeal exudate. Mucosa moist. Eyes: Pupils are equal, round, and reactive to light. Conjunctivae are normal. No scleral icterus.  Neck: Normal range of motion. Neck supple. No JVD present.  Cardiovascular: Normal rate, regular rhythm and normal heart sounds.  Exam reveals no gallop and no friction rub.   No murmur heard. Pulmonary/Chest: Effort normal and breath sounds normal. No respiratory distress. No wheezes.No rales.  Abdominal: Soft. Bowel sounds are normal. No distension. There is no tenderness. There is no guarding.  Musculoskeletal: No edema or tenderness.  Lymphadenopathy: No cervical,axillary or supraclavicular adenopathy.  Neurological: Alert and oriented to person, place, and time. No cranial nerve deficit.  Skin: Skin is warm and dry. No rash noted. No erythema. No pallor.   Psychiatric: Affect and judgment normal.   Labs No visits with results within 3 Day(s) from this visit.  Latest known visit with results is:  Admission on 11/20/2018, Discharged on 11/22/2018  Component Date Value Ref Range Status  . Sodium 11/20/2018 142  135 - 145 mmol/L Final  . Potassium 11/20/2018 3.0* 3.5 - 5.1 mmol/L Final  . Chloride 11/20/2018 108  98 - 111 mmol/L Final  . CO2 11/20/2018 27  22 - 32 mmol/L Final  . Glucose, Bld 11/20/2018 111* 70 - 99 mg/dL Final  . BUN 11/20/2018 26* 8 - 23 mg/dL Final  . Creatinine, Ser 11/20/2018 0.52  0.44 - 1.00 mg/dL Final  .  Calcium 11/20/2018 8.9  8.9 - 10.3 mg/dL Final  . Total Protein 11/20/2018 5.8* 6.5 - 8.1 g/dL Final  . Albumin 11/20/2018 3.1* 3.5 - 5.0 g/dL Final  . AST 11/20/2018 24  15 - 41 U/L Final  . ALT 11/20/2018 46* 0 - 44 U/L Final  . Alkaline Phosphatase 11/20/2018 76  38 - 126 U/L Final  . Total Bilirubin 11/20/2018 0.4  0.3 - 1.2 mg/dL Final  . GFR calc non Af Amer 11/20/2018 >60  >60 mL/min Final  . GFR calc Af Amer 11/20/2018 >60  >60 mL/min Final  . Anion gap 11/20/2018 7  5 - 15 Final   Performed at [facility], [address] [place], [place] [address]  . WBC 11/20/2018 15.0* 4.0 - 10.5 K/uL Final  . RBC 11/20/2018 3.77* 3.87 - 5.11 MIL/uL Final  . Hemoglobin 11/20/2018 10.3* 12.0 - 15.0 g/dL Final  . HCT 11/20/2018 34.6* 36.0 - 46.0 % Final  . MCV 11/20/2018 91.8  80.0 - 100.0 fL Final  . MCH 11/20/2018 27.3  26.0 - 34.0 pg Final  . MCHC 11/20/2018 29.8* 30.0 - 36.0 g/dL Final  . RDW 11/20/2018 20.4* 11.5 - 15.5 % Final  . Platelets 11/20/2018 550* 150 - 400 K/uL Final  . nRBC 11/20/2018 0.0  0.0 - 0.2 % Final   Performed at [facility], [address], [place], [place] [address]  . ABO/RH(D) 11/20/2018 O POS   Final  . Antibody Screen 11/20/2018 NEG   Final  . Sample Expiration 11/20/2018    Final                   Value:11/23/2018 Performed at [facility], [address] [place], [place]  [address]   . WBC 11/21/2018 11.7* 4.0 - 10.5 K/uL Final  . RBC 11/21/2018 3.48* 3.87 - 5.11 MIL/uL Final  . Hemoglobin 11/21/2018 9.7* 12.0 - 15.0 g/dL Final  . HCT 11/21/2018 32.1* 36.0 - 46.0 % Final  . MCV 11/21/2018 92.2  80.0 - 100.0 fL Final  . MCH 11/21/2018 27.9  26.0 - 34.0 pg Final  . MCHC 11/21/2018 30.2  30.0 - 36.0 g/dL Final  . RDW 11/21/2018 20.6* 11.5 - 15.5 % Final  . Platelets 11/21/2018 504* 150 - 400 K/uL Final  . nRBC 11/21/2018 0.0  0.0 - 0.2 % Final  . Neutrophils Relative % 11/21/2018 65  % Final  . Neutro Abs 11/21/2018 7.6  1.7 - 7.7 K/uL Final  . Lymphocytes Relative 11/21/2018 26  % Final  . Lymphs Abs 11/21/2018 3.0  0.7 - 4.0 K/uL Final  . Monocytes Relative 11/21/2018 6  % Final  . Monocytes Absolute 11/21/2018 0.8  0.1 - 1.0 K/uL Final  . Eosinophils Relative 11/21/2018 2  % Final  . Eosinophils Absolute 11/21/2018 0.2  0.0 - 0.5 K/uL Final  . Basophils Relative 11/21/2018 0  % Final  . Basophils Absolute 11/21/2018 0.0  0.0 - 0.1 K/uL Final  . Immature Granulocytes 11/21/2018 1  % Final  . Abs Immature Granulocytes 11/21/2018 0.14* 0.00 - 0.07 K/uL Final   Performed at [facility], [address] [place], [place] [address]  . Magnesium 11/20/2018 1.7  1.7 - 2.4 mg/dL Final   Performed at [facility], [address] [place], [place] [address]  . Phosphorus 11/20/2018 3.7  2.5 - 4.6 mg/dL Final   Performed at [facility], [address] [place],  [address]  . MRSA by PCR 11/21/2018 NEGATIVE  NEGATIVE  Final   Comment:        The GeneXpert MRSA Assay (FDA approved for NASAL specimens only), is one component of a comprehensive MRSA colonization surveillance program. It is not intended to diagnose MRSA infection nor to guide or monitor treatment for MRSA infections. Performed at [facility], [address] [place], [place] [address]   . Sodium 11/21/2018 139  135 - 145 mmol/L Final  . Potassium 11/21/2018 3.6  3.5 - 5.1 mmol/L Final  .  Chloride 11/21/2018 108  98 - 111 mmol/L Final  . CO2 11/21/2018 25  22 - 32 mmol/L Final  . Glucose, Bld 11/21/2018 79  70 - 99 mg/dL Final  . BUN 11/21/2018 26* 8 - 23 mg/dL Final  . Creatinine, Ser 11/21/2018 0.58  0.44 - 1.00 mg/dL Final  . Calcium 11/21/2018 8.4* 8.9 - 10.3 mg/dL Final  . GFR calc non Af Amer 11/21/2018 >60  >60 mL/min Final  . GFR calc Af Amer 11/21/2018 >60  >60 mL/min Final  . Anion gap 11/21/2018 6  5 - 15 Final   Performed at [facility], [address] [place], [place] [address]  . WBC 11/22/2018 9.5  4.0 - 10.5 K/uL Final  . RBC 11/22/2018 3.65* 3.87 - 5.11 MIL/uL Final  . Hemoglobin 11/22/2018 9.9* 12.0 - 15.0 g/dL Final  . HCT 11/22/2018 33.6* 36.0 - 46.0 % Final  . MCV 11/22/2018 92.1  80.0 - 100.0 fL Final  . MCH 11/22/2018 27.1  26.0 - 34.0 pg Final  . MCHC 11/22/2018 29.5* 30.0 - 36.0 g/dL Final  . RDW 11/22/2018 20.6* 11.5 - 15.5 % Final  . Platelets 11/22/2018 483* 150 - 400 K/uL Final  . nRBC 11/22/2018 0.0  0.0 - 0.2 % Final  . Neutrophils Relative % 11/22/2018 62  % Final  . Neutro Abs 11/22/2018 5.9  1.7 - 7.7 K/uL Final  . Lymphocytes Relative 11/22/2018 26  % Final  . Lymphs Abs 11/22/2018 2.5  0.7 - 4.0 K/uL Final  . Monocytes Relative 11/22/2018 7  % Final  . Monocytes Absolute 11/22/2018 0.6  0.1 - 1.0 K/uL Final  . Eosinophils Relative 11/22/2018 3  % Final  . Eosinophils Absolute 11/22/2018 0.3  0.0 - 0.5 K/uL Final  . Basophils Relative 11/22/2018 1  % Final  . Basophils Absolute 11/22/2018 0.1  0.0 - 0.1 K/uL Final  . Immature Granulocytes 11/22/2018 1  % Final  . Abs Immature Granulocytes 11/22/2018 0.13* 0.00 - 0.07 K/uL Final   Performed at [facility], [address], [place], [place] [address]  . Sodium 11/22/2018 137  135 - 145 mmol/L Final  . Potassium 11/22/2018 3.6  3.5 - 5.1 mmol/L Final  . Chloride 11/22/2018 105  98 - 111 mmol/L Final  . CO2 11/22/2018 24  22 - 32 mmol/L Final  . Glucose, Bld 11/22/2018 89  70 - 99  mg/dL Final  . BUN 11/22/2018 17  8 - 23 mg/dL Final  . Creatinine, Ser 11/22/2018 0.62  0.44 - 1.00 mg/dL Final  . Calcium 11/22/2018 8.4* 8.9 - 10.3 mg/dL Final  . GFR calc non Af Amer 11/22/2018 >60  >60 mL/min Final  . GFR calc Af Amer 11/22/2018 >60  >60 mL/min Final  . Anion gap 11/22/2018 8  5 - 15 Final   Performed at [facility], [address] [place], [place] [address]     Pathology Orders Placed This Encounter  Procedures  . CBC with Differential/Platelet  Standing Status:   Future    Standing Expiration Date:   11/30/2019  . Comprehensive metabolic panel    Standing Status:   Future    Standing Expiration Date:   11/30/2019  . Lactate dehydrogenase    Standing Status:   Future    Standing Expiration Date:   11/30/2019  . Protein electrophoresis, serum    Standing Status:   Future    Standing Expiration Date:   11/30/2019  . Ferritin    Standing Status:   Future    Standing Expiration Date:   11/30/2019  . Haptoglobin    Standing Status:   Future    Standing Expiration Date:   11/30/2019  . Vitamin B12    Standing Status:   Future    Standing Expiration Date:   11/30/2019  . Folate    Standing Status:   Future    Standing Expiration Date:   11/30/2019  . Methylmalonic acid, serum    Standing Status:   Future    Standing Expiration Date:   11/30/2019       Zoila Shutter MD

## 2018-11-30 ENCOUNTER — Ambulatory Visit: Payer: Medicare Other | Admitting: Gastroenterology

## 2018-12-14 ENCOUNTER — Inpatient Hospital Stay (HOSPITAL_COMMUNITY): Payer: Medicare Other

## 2018-12-14 DIAGNOSIS — D509 Iron deficiency anemia, unspecified: Secondary | ICD-10-CM | POA: Diagnosis present

## 2018-12-14 DIAGNOSIS — D508 Other iron deficiency anemias: Secondary | ICD-10-CM

## 2018-12-14 DIAGNOSIS — E538 Deficiency of other specified B group vitamins: Secondary | ICD-10-CM | POA: Diagnosis not present

## 2018-12-14 LAB — COMPREHENSIVE METABOLIC PANEL
ALT: 14 U/L (ref 0–44)
AST: 19 U/L (ref 15–41)
Albumin: 3.9 g/dL (ref 3.5–5.0)
Alkaline Phosphatase: 91 U/L (ref 38–126)
Anion gap: 10 (ref 5–15)
BUN: 21 mg/dL (ref 8–23)
CO2: 26 mmol/L (ref 22–32)
Calcium: 9.2 mg/dL (ref 8.9–10.3)
Chloride: 103 mmol/L (ref 98–111)
Creatinine, Ser: 0.89 mg/dL (ref 0.44–1.00)
GFR calc Af Amer: >60 mL/min (ref >60)
GFR calc non Af Amer: 60 mL/min — ABNORMAL LOW (ref >60)
Glucose, Bld: 103 mg/dL — ABNORMAL HIGH (ref 70–99)
Potassium: 3.9 mmol/L (ref 3.5–5.1)
Sodium: 139 mmol/L (ref 135–145)
Total Bilirubin: 0.5 mg/dL (ref 0.3–1.2)
Total Protein: 7.6 g/dL (ref 6.5–8.1)

## 2018-12-14 LAB — CBC WITH DIFFERENTIAL/PLATELET
Abs Immature Granulocytes: 0.02 10*3/uL (ref 0.00–0.07)
Basophils Absolute: 0 10*3/uL (ref 0.0–0.1)
Basophils Relative: 0 %
Eosinophils Absolute: 0.1 10*3/uL (ref 0.0–0.5)
Eosinophils Relative: 2 %
HCT: 38.2 % (ref 36.0–46.0)
Hemoglobin: 11.3 g/dL — ABNORMAL LOW (ref 12.0–15.0)
Immature Granulocytes: 0 %
Lymphocytes Relative: 24 %
Lymphs Abs: 1.7 10*3/uL (ref 0.7–4.0)
MCH: 27.4 pg (ref 26.0–34.0)
MCHC: 29.6 g/dL — ABNORMAL LOW (ref 30.0–36.0)
MCV: 92.7 fL (ref 80.0–100.0)
Monocytes Absolute: 0.5 10*3/uL (ref 0.1–1.0)
Monocytes Relative: 7 %
Neutro Abs: 4.9 10*3/uL (ref 1.7–7.7)
Neutrophils Relative %: 67 %
Platelets: 507 10*3/uL — ABNORMAL HIGH (ref 150–400)
RBC: 4.12 MIL/uL (ref 3.87–5.11)
RDW: 19.7 % — ABNORMAL HIGH (ref 11.5–15.5)
WBC: 7.4 10*3/uL (ref 4.0–10.5)
nRBC: 0 % (ref 0.0–0.2)

## 2018-12-14 LAB — FERRITIN: Ferritin: 103 ng/mL (ref 11–307)

## 2018-12-14 LAB — FOLATE: Folate: 11.6 ng/mL (ref >5.9)

## 2018-12-14 LAB — LACTATE DEHYDROGENASE: LDH: 148 U/L (ref 98–192)

## 2018-12-14 LAB — VITAMIN B12: Vitamin B-12: 141 pg/mL — ABNORMAL LOW (ref 180–914)

## 2018-12-15 LAB — PROTEIN ELECTROPHORESIS, SERUM
A/G Ratio: 1 (ref 0.7–1.7)
Albumin ELP: 3.5 g/dL (ref 2.9–4.4)
Alpha-1-Globulin: 0.3 g/dL (ref 0.0–0.4)
Alpha-2-Globulin: 1.1 g/dL — ABNORMAL HIGH (ref 0.4–1.0)
Beta Globulin: 1.2 g/dL (ref 0.7–1.3)
Gamma Globulin: 0.7 g/dL (ref 0.4–1.8)
Globulin, Total: 3.4 g/dL (ref 2.2–3.9)
Total Protein ELP: 6.9 g/dL (ref 6.0–8.5)

## 2018-12-15 LAB — HAPTOGLOBIN: Haptoglobin: 350 mg/dL — ABNORMAL HIGH (ref 41–333)

## 2018-12-17 LAB — METHYLMALONIC ACID, SERUM: Methylmalonic Acid, Quantitative: 479 nmol/L — ABNORMAL HIGH (ref 0–378)

## 2018-12-20 ENCOUNTER — Encounter: Payer: Self-pay | Admitting: Gastroenterology

## 2018-12-20 ENCOUNTER — Ambulatory Visit (INDEPENDENT_AMBULATORY_CARE_PROVIDER_SITE_OTHER): Payer: Medicare Other | Admitting: Gastroenterology

## 2018-12-20 VITALS — BP 155/83 | HR 74 | Temp 97.1°F | Ht 60.0 in | Wt 116.4 lb

## 2018-12-20 DIAGNOSIS — D500 Iron deficiency anemia secondary to blood loss (chronic): Secondary | ICD-10-CM | POA: Diagnosis not present

## 2018-12-20 NOTE — Patient Instructions (Signed)
Please decrease Protonix to once each morning, 30 minutes before breakfast.  Call us if you have any further issues or concerns!  I will see you in 6 months!  I would love to see one of your cards! Our address is [address] [place], [place], [address]  It was a pleasure to see you today. I strive to create trusting relationships with patients to provide genuine, compassionate, and quality care. I value your feedback. If you receive a survey regarding your visit,  I greatly appreciate you taking time to fill this out.   Annitta Needs, PhD, ANP-BC [facility]

## 2018-12-20 NOTE — Progress Notes (Signed)
Referring Provider: Barry Dienes, NP Primary Care Physician:  Sander Radon, NP  Primary GI: Dr. Gala Romney   Chief Complaint  Patient presents with  . Wanda [name]    f/u    HPI:   Wanda [name] is an 85 y.o. female presenting today with a history of Wanda [name], presenting in hospital follow-up after admission anemia, hematemesis. EGD completed with normal esophagus, large hiatal hernia, erosive gastropathy s/p biopsy, normal duodenum. Reactive gastropathy. Previously had not been taking Protonix daily. Prior evaluation for Wanda [name] with capsule study 2013 without explanation for Wanda [name]. Last colonoscopy in 2013 with colonic diverticulosis.  No abdominal pain. No N/V. No overt GI bleeding. Good appetite. Has been making cards for care homes for Valentine's day. Enjoys doing this as a hobby. Feels tired.    Past Medical History:  Diagnosis Date  . Anemia   . Cystocele   . Diverticulosis 10/20/10   Left-sided on colonoscopy by Dr. Gala Romney 10/20/10  . Gastric polyps 10/02/2013  . Gastritis 12/08/10   EGD by Dr. Altha Harm hiatal hernia, duodenal diverticulum, chronic gastritis  . GERD (gastroesophageal reflux disease)   . Heart murmur   . Hiatal hernia 10/02/2013  . HTN (hypertension)   . Wanda [name] (iron deficiency anemia)   . 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 Osteoarthritis   . Radial fracture    (right) Undergoing treatment by Dr. Aline Brochure currently  . Sleep apnea    STOP BANG SCORE 4  . Urge incontinence   . Vitamin D deficiency     Past Surgical History:  Procedure Laterality Date  . ABDOMINAL HYSTERECTOMY    . ANTERIOR AND POSTERIOR REPAIR  05/17/2012   Procedure: ANTERIOR (CYSTOCELE) AND POSTERIOR REPAIR (RECTOCELE);  Surgeon: Jonnie Kind, MD;  Location: [facility];  Service: Gynecology;  Laterality: N/A;  . BIOPSY  11/22/2018   Procedure: BIOPSY;  Surgeon: Daneil Dolin, MD;  Location: [facility];  Service: Endoscopy;;  . COLONOSCOPY  02/29/2012   Dr. Gala Romney: colonic  diverticulosis, minimal internal hemorrhoids, benign polyp  . ESOPHAGOGASTRODUODENOSCOPY  02/29/2012   Dr. Gala Romney: atonic esophagus, moderate-sized hiatal hernia, fundal gland polyps  . ESOPHAGOGASTRODUODENOSCOPY N/A 10/02/2013   Dr. Gala Romney- multiple Wilhemena Durie gastric polyps- not manipulated  . ESOPHAGOGASTRODUODENOSCOPY N/A 01/14/2015   Dr. Gala Romney: Large hiatal hernia, otherwise normal EGD  . ESOPHAGOGASTRODUODENOSCOPY (EGD) WITH PROPOFOL N/A 11/22/2018   Dr. Gala Romney: normal esophagus, large hiatal hernia, erosive gastropathy s/p biopsy, normal duodenum. Reactive gastropathy  . GIVENS CAPSULE STUDY  02/29/2012   No explanation for Wanda [name]. Possible extrinsic compression but negative CT.   Marland Kitchen SALPINGOOPHORECTOMY  05/17/2012   Procedure: SALPINGO OOPHERECTOMY;  Surgeon: Jonnie Kind, MD;  Location: [facility];  Service: Gynecology;  Laterality: Bilateral;  . VAGINAL HYSTERECTOMY  05/17/2012   Procedure: HYSTERECTOMY VAGINAL;  Surgeon: Jonnie Kind, MD;  Location: [facility];  Service: Gynecology;  Laterality: N/A;    Current Outpatient Medications  Medication Sig Dispense Refill  . CALCIUM PO Take 300-600 mg by mouth daily. Has to be cut in half to take    . ferrous sulfate 325 (65 FE) MG tablet Take 1 tablet (325 mg total) by mouth daily with breakfast. 30 tablet 3  . lisinopril-hydrochlorothiazide (PRINZIDE,ZESTORETIC) 20-12.5 MG per tablet Take 1 tablet by mouth daily.    . pantoprazole (PROTONIX) 40 MG tablet Take 1 tablet (40 mg total) by mouth 2 (two) times daily. 60 tablet 3   No current facility-administered medications for this visit.  Allergies as of 12/20/2018 - Review Complete 12/20/2018  Allergen Reaction Noted  . Sulfa antibiotics  06/23/2016  . Naproxen sodium Rash and Other (See Comments)     Family History  Problem Relation Age of Onset  . Hypertension Mother   . Lymphoma Brother   . Anesthesia problems Neg Hx   . Hypotension Neg Hx   . Malignant hyperthermia Neg Hx     . Pseudochol deficiency Neg Hx   . Colon cancer Neg Hx     Social History   Socioeconomic History  . Marital status: Widowed    Spouse name: Not on file  . Number of children: Not on file  . Years of education: Not on file  . Highest education level: Not on file  Occupational History  . Occupation: Retired    Fish farm manager: RETIRED  Social Needs  . Financial resource strain: Not on file  . Food insecurity:    Worry: Not on file    Inability: Not on file  . Transportation needs:    Medical: Not on file    Non-medical: Not on file  Tobacco Use  . Smoking status: Never Smoker  . Smokeless tobacco: Never Used  . Tobacco comment: Never smoked  Substance and Sexual Activity  . Alcohol use: No    Alcohol/week: 0.0 standard drinks  . Drug use: No  . Sexual activity: Not Currently    Birth control/protection: Surgical  Lifestyle  . Physical activity:    Days per week: Not on file    Minutes per session: Not on file  . Stress: Not on file  Relationships  . Social connections:    Talks on phone: Not on file    Gets together: Not on file    Attends religious service: Not on file    Active member of club or organization: Not on file    Attends meetings of clubs or organizations: Not on file    Relationship status: Not on file  Other Topics Concern  . Not on file  Social History Narrative  . Not on file    Review of Systems: Gen: see HPI CV: Denies chest pain, palpitations, syncope, peripheral edema, and claudication. Resp: Denies dyspnea at rest, cough, wheezing, coughing up blood, and pleurisy. GI: see HPI Derm: Denies rash, itching, dry skin Psych: Denies depression, anxiety, memory loss, confusion. No homicidal or suicidal ideation.  Heme: Denies bruising, bleeding, and enlarged lymph nodes.  Physical Exam: BP (!) 155/83   Pulse 74   Temp (!) 97.1 F (36.2 C) (Oral)   Ht 5' (1.524 m)   Wt 116 lb 6.4 oz (52.8 kg)   BMI 22.73 kg/m  General:   Alert and oriented.  No distress noted. Pleasant and cooperative.  Head:  Normocephalic and atraumatic. Eyes:  Conjuctiva clear without scleral icterus. Mouth:  Oral mucosa pink and moist.  Abdomen:  +BS, soft, non-tender and non-distended. No rebound or guarding. No HSM or masses noted. Msk:  Slight kyphosis Extremities:  Without edema. Neurologic:  Alert and  oriented x4 Psych:  Alert and cooperative. Normal mood and affect.  Lab Results  Component Value Date   WBC 7.4 12/14/2018   HGB 11.3 (L) 12/14/2018   HCT 38.2 12/14/2018   MCV 92.7 12/14/2018   PLT 507 (H) 12/14/2018   Lab Results  Component Value Date   ALT 14 12/14/2018   AST 19 12/14/2018   ALKPHOS 91 12/14/2018   BILITOT 0.5 12/14/2018   Lab Results  Component  Value Date   CREATININE 0.89 12/14/2018   BUN 21 12/14/2018   NA 139 12/14/2018   K 3.9 12/14/2018   CL 103 12/14/2018   CO2 26 12/14/2018   Lab Results  Component Value Date   IRON 25 (L) 11/04/2018   TIBC 496 (H) 11/04/2018   FERRITIN 103 12/14/2018   Lab Results  Component Value Date   VITAMINB12 141 (L) 12/14/2018

## 2018-12-20 NOTE — Assessment & Plan Note (Signed)
85 year old female with history of IDA and recent hospitalization for hematemesis, s/p EGD with normal esophagus, large hiatal hernia, erosive gastropathy s/p biopsy, normal duodenum, reactive gastropathy. Doing well now with improvement in ferritin and Hgb recently. No overt GI bleeding. Last colonoscopy in 2013 with colonic diverticulosis. Capsule study in 2013 without explanation for IDA. Will have her decrease PPI to once daily. I do note she has B12 deficiency and will be seeing Hematology soon. Return in 6 months or sooner if needed.

## 2018-12-20 NOTE — Progress Notes (Signed)
CC'D TO PCP °

## 2019-01-02 ENCOUNTER — Ambulatory Visit (HOSPITAL_COMMUNITY): Payer: Medicare Other | Admitting: Internal Medicine

## 2019-01-04 ENCOUNTER — Encounter (HOSPITAL_COMMUNITY): Payer: Self-pay | Admitting: Internal Medicine

## 2019-01-04 ENCOUNTER — Inpatient Hospital Stay (HOSPITAL_COMMUNITY): Payer: Medicare Other | Attending: Internal Medicine | Admitting: Internal Medicine

## 2019-01-04 ENCOUNTER — Inpatient Hospital Stay (HOSPITAL_COMMUNITY): Payer: Medicare Other

## 2019-01-04 ENCOUNTER — Other Ambulatory Visit: Payer: Self-pay

## 2019-01-04 VITALS — BP 140/69 | HR 70 | Temp 98.0°F | Resp 18 | Wt 117.5 lb

## 2019-01-04 DIAGNOSIS — D508 Other iron deficiency anemias: Secondary | ICD-10-CM

## 2019-01-04 DIAGNOSIS — D509 Iron deficiency anemia, unspecified: Secondary | ICD-10-CM | POA: Insufficient documentation

## 2019-01-04 DIAGNOSIS — E538 Deficiency of other specified B group vitamins: Secondary | ICD-10-CM

## 2019-01-04 MED ORDER — CYANOCOBALAMIN 1000 MCG/ML IJ SOLN: 1000.0000 ug | Freq: Once | INTRAMUSCULAR | Status: DC

## 2019-01-04 MED ORDER — CYANOCOBALAMIN 1000 MCG/ML IJ SOLN
1000.0000 ug | Freq: Once | INTRAMUSCULAR | Status: AC
  Administered 2019-01-04: 1000 ug via INTRAMUSCULAR

## 2019-01-04 MED ORDER — CYANOCOBALAMIN 1000 MCG/ML IJ SOLN
INTRAMUSCULAR | Status: AC
  Filled 2019-01-04: qty 1

## 2019-01-04 NOTE — Patient Instructions (Signed)
[facility] at [facility] Discharge Instructions  Received Vit B12 injection today. Follow-up as scheduled. Call clinic for any questions or concerns   Thank you for choosing [facility] at [facility] to provide your oncology and hematology care.  To afford each patient quality time with our provider, please arrive at least 15 minutes before your scheduled appointment time.   If you have a lab appointment with the Cancer Center please come in thru the  Main Entrance and check in at the main information desk  You need to re-schedule your appointment should you arrive 10 or more minutes late.  We strive to give you quality time with our providers, and arriving late affects you and other patients whose appointments are after yours.  Also, if you no show three or more times for appointments you may be dismissed from the clinic at the providers discretion.     Again, thank you for choosing [facility].  Our hope is that these requests will decrease the amount of time that you wait before being seen by our physicians.       _____________________________________________________________  Should you have questions after your visit to [facility], please contact our office at [phone] between the hours of 8:00 a.m. and 4:30 p.m.  Voicemails left after 4:00 p.m. will not be returned until the following business day.  For prescription refill requests, have your pharmacy contact our office and allow 72 hours.    Cancer Center Support Programs:   > Cancer Support Group  2nd Tuesday of the month 1pm-2pm, Journey Room   

## 2019-01-04 NOTE — Progress Notes (Signed)
Kaylean P Nemmers tolerated Vit b12 injection well without complaints or incident. VSS Pt discharged self ambulatory in satisfactory condition accompanied by her family member

## 2019-01-04 NOTE — Progress Notes (Signed)
Diagnosis B12 deficiency - Plan: CBC with Differential, Comprehensive metabolic panel, Lactate dehydrogenase, Vitamin B12, Methylmalonic acid, serum, Ferritin, DISCONTINUED: cyanocobalamin ((VITAMIN B-12)) injection 1,000 mcg  Other iron deficiency anemia - Plan: CBC with Differential, Comprehensive metabolic panel, Lactate dehydrogenase, Vitamin B12, Methylmalonic acid, serum, Ferritin  Staging Cancer Staging No matching staging information was found for the patient.  Assessment and Plan:  1.  Iron deficiency anemia.  85 year old female referred for evaluation of anemia.  She reports 5 years ago she had an episode of anemia that required blood transfusion.  She denies any blood in stool or urine.  Pt reports she developed joint pain in left had and was placed on prednisone by PCP.  She subsequently developed abdominal pain and was seen at ER with labs done on 11/04/2018 showing WBC 9.8 HB 5.9 Plts 440,000.  Chemistries WNL with K+ 3.4 Cr 0.69 and normal LFTs.  Ferritin was decreased at 7.  Pt was transfused with 2 units PRBCs with improvement to 8 range. Received IV Feraheme as well during admission. Pt was recently hospitalized 11/20/2018 to 11/22/2018 for anemia.  Pt had EGD in 2016 by Dr. Gala Romney with large hiatal hernia, otherwise normal. Previously noted multiple [name] lesions in 2014. Capsule study 2013 without explanation for IDA. Last colonoscopy in 2013 with colonic diverticulosis.   Pt has undergone EGD 11/22/2018 that showed   - Normal esophagus. - Large hiatal hernia. - Erosive gastropathy. Biopsied - Normal duodenal bulb and second portion of the duodenum. Anemia likely multifactorial in etiology.  She reports she is feeling better since hospitalization.  Pt denies any family history of blood disorders.  Pt denies bleeding or bruising.   Labs done 11/22/2018 showed WBC 9.5 HB 9.9 plts 483,000.  Chemistries WNL with K+ 3.6 Cr 0.62.  I discussed with her HB has improved to 9.9 after  recent transfusion and IV iron.    Labs done 12/14/2018 reviewed and showed WBC 7.4 HB 11.3 plts 507,000.  Chemistries WNL with K+ 3.9 Cr 0.89 and normal LFTs.  Ferritin improved at 103.  Folate WNL.  B12 level decreased at 141.  MMA elevated at 479.  SPEP negative.  Pt will have repeat labs in 03/2019.  Pt has undergone GI evaluation and should follow-up with GI as directed.    2.  B12 Deficiency.  B12 low at 141 and MMA elevated at 479 which is consistent with B12 deficiency.  Pt will begin B12 1000 mcg IM monthly.  Will repeat levels in 03/2019.    3.  Joint pain.  SPEP negative.  Follow-up with PCP if ongoing symptoms.     4.  Fatigue.  HB improved at 11.  Iron levels improved.  Will begin B12 supplementation and monitor for improvement in symptoms.    5.  Health maintenance.  Pt had GI evaluation on 11/22/2018.  Follow-up with GI as recommended.    Interval History:  Historical data obtained from noted dated 11/29/2018:  85 year old female referred for evaluation of anemia.  She reports 5 years ago she had an episode of anemia that required blood transfusion.  She denies any blood in stool or urine.  Pt reports she developed joint pain in left had and was placed on prednisone by PCP.  She subsequently developed abdominal pain and was seen at ER with labs done on 11/04/2018 showing WBC 9.8 HB 5.9 Plts 440,000.  Chemistries WNL with K+ 3.4 Cr 0.69 and normal LFTs.  Ferritin was decreased at  7.  Pt was transfused with 2 units PRBCs with improvement to 8 range. Received IV Feraheme as well during admission.   Pt had EGD in 2016 by Dr. Gala Romney with large hiatal hernia, otherwise normal. Previously noted multiple [name] lesions in 2014. Capsule study 2013 without explanation for IDA. Last colonoscopy in 2013 with colonic diverticulosis.   Pt has undergone EGD 11/22/2018 that showed   - Normal esophagus. - Large hiatal hernia. - Erosive gastropathy. Biopsied - Normal duodenal bulb and second portion of the  duodenum. Anemia likely multifactorial in etiology.  She reports she is feeling better since hospitalization.  She had recent blood work done.  Pt denies any family history of blood disorders.  Pt denies bleeding or bruising.   Current Status:  Pt is seen today for follow-up.  She is here to go over labs.    Problem List Patient Active Problem List   Diagnosis Date Noted  . Sleep apnea [G47.30] 11/20/2018  . Hypokalemia [E87.6] 11/20/2018  . Leukocytosis [D72.829] 11/20/2018  . Symptomatic anemia [D64.9] 11/04/2018  . Diarrhea, functional [K59.1] 03/15/2015  . Fatigue [R53.83] 12/28/2014  . Upper abdominal pain [R10.10] 12/28/2014  . B12 deficiency [E53.8] 12/28/2014    Class: History of  . Acute blood loss anemia [D62] 10/02/2013  . GI bleed [K92.2] 10/02/2013  . Melena [K92.1] 10/02/2013  . Hematemesis [K92.0] 10/02/2013  . Sinus tachycardia [R00.0] 10/02/2013  . Multiple gastric erosions [K25.9] 10/02/2013  . Hiatal hernia [K44.9] 10/02/2013  . Gastric polyps [K31.7] 10/02/2013  . HTN (hypertension) [I10]   . IDA (iron deficiency anemia) [D50.9]   . Adhesive capsulitis of right shoulder [M75.01] 03/01/2012  . Iron deficiency anemia [D50.9] 02/02/2012  . Distal radius fracture [S52.509A] 11/30/2011  . Diverticulosis [K57.90] 10/20/2010  . Normocytic anemia [D64.9] 10/01/2010  . GERD [K21.9] 10/01/2010  . FECAL OCCULT BLOOD [R19.5] 10/01/2010    Past Medical History Past Medical History:  Diagnosis Date  . Anemia   . Cystocele   . Diverticulosis 10/20/10   Left-sided on colonoscopy by Dr. Gala Romney 10/20/10  . Gastric polyps 10/02/2013  . Gastritis 12/08/10   EGD by Dr. Altha Harm hiatal hernia, duodenal diverticulum, chronic gastritis  . GERD (gastroesophageal reflux disease)   . Heart murmur   . Hiatal hernia 10/02/2013  . HTN (hypertension)   . IDA (iron deficiency anemia)   . 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 Osteoarthritis   . Radial fracture    (right)  Undergoing treatment by Dr. Aline Brochure currently  . Sleep apnea    STOP BANG SCORE 4  . Urge incontinence   . Vitamin D deficiency     Past Surgical History Past Surgical History:  Procedure Laterality Date  . ABDOMINAL HYSTERECTOMY    . ANTERIOR AND POSTERIOR REPAIR  05/17/2012   Procedure: ANTERIOR (CYSTOCELE) AND POSTERIOR REPAIR (RECTOCELE);  Surgeon: Jonnie Kind, MD;  Location: [facility];  Service: Gynecology;  Laterality: N/A;  . BIOPSY  11/22/2018   Procedure: BIOPSY;  Surgeon: Daneil Dolin, MD;  Location[facility];  Service: Endoscopy;;  . COLONOSCOPY  02/29/2012   Dr. Gala Romney: colonic diverticulosis, minimal internal hemorrhoids, benign polyp  . ESOPHAGOGASTRODUODENOSCOPY  02/29/2012   Dr. Gala Romney: atonic esophagus, moderate-sized hiatal hernia, fundal gland polyps  . ESOPHAGOGASTRODUODENOSCOPY N/A 10/02/2013   Dr. Gala Romney- multiple Wilhemena Durie gastric polyps- not manipulated  . ESOPHAGOGASTRODUODENOSCOPY N/A 01/14/2015   Dr. Gala Romney: Large hiatal hernia, otherwise normal EGD  . ESOPHAGOGASTRODUODENOSCOPY (EGD) WITH PROPOFOL N/A 11/22/2018  Dr. Gala Romney: normal esophagus, large hiatal hernia, erosive gastropathy s/p biopsy, normal duodenum. Reactive gastropathy  . GIVENS CAPSULE STUDY  02/29/2012   No explanation for IDA. Possible extrinsic compression but negative CT.   Marland Kitchen SALPINGOOPHORECTOMY  05/17/2012   Procedure: SALPINGO OOPHERECTOMY;  Surgeon: Jonnie Kind, MD;  Location: [facility];  Service: Gynecology;  Laterality: Bilateral;  . VAGINAL HYSTERECTOMY  05/17/2012   Procedure: HYSTERECTOMY VAGINAL;  Surgeon: Jonnie Kind, MD;  Location: [facility];  Service: Gynecology;  Laterality: N/A;    Family History Family History  Problem Relation Age of Onset  . Hypertension Mother   . Lymphoma Brother   . Anesthesia problems Neg Hx   . Hypotension Neg Hx   . Malignant hyperthermia Neg Hx   . Pseudochol deficiency Neg Hx   . Colon cancer Neg Hx      Social History  reports  that she has never smoked. She has never used smokeless tobacco. She reports that she does not drink alcohol or use drugs.  Medications  Current Outpatient Medications:  .  CALCIUM PO, Take 300-600 mg by mouth daily. Has to be cut in half to take, Disp: , Rfl:  .  ferrous sulfate 325 (65 FE) MG tablet, Take 1 tablet (325 mg total) by mouth daily with breakfast., Disp: 30 tablet, Rfl: 3 .  lisinopril-hydrochlorothiazide (PRINZIDE,ZESTORETIC) 20-12.5 MG per tablet, Take 1 tablet by mouth daily., Disp: , Rfl:  .  pantoprazole (PROTONIX) 40 MG tablet, Take 1 tablet (40 mg total) by mouth 2 (two) times daily., Disp: 60 tablet, Rfl: 3 No current facility-administered medications for this visit.   Facility-Administered Medications Ordered in Other Visits:  .  cyanocobalamin ((VITAMIN B-12)) injection 1,000 mcg, 1,000 mcg, Intramuscular, Once, Narda Fundora, Mathis Dad, MD  Allergies Sulfa antibiotics and Naproxen sodium  Review of Systems Review of Systems - Oncology ROS negative other than fatigue   Physical Exam  Vitals Wt Readings from Last 3 Encounters:  01/04/19 117 lb 8 oz (53.3 kg)  12/20/18 116 lb 6.4 oz (52.8 kg)  11/29/18 117 lb (53.1 kg)   Temp Readings from Last 3 Encounters:  01/04/19 98 F (36.7 C) (Oral)  12/20/18 (!) 97.1 F (36.2 C) (Oral)  11/29/18 97.7 F (36.5 C) (Oral)   BP Readings from Last 3 Encounters:  01/04/19 140/69  12/20/18 (!) 155/83  11/29/18 (!) 153/73   Pulse Readings from Last 3 Encounters:  01/04/19 70  12/20/18 74  11/29/18 74   Constitutional: Well-developed, well-nourished, and in no distress.   HENT: Head: Normocephalic and atraumatic.  Mouth/Throat: No oropharyngeal exudate. Mucosa moist. Eyes: Pupils are equal, round, and reactive to light. Conjunctivae are normal. No scleral icterus.  Neck: Normal range of motion. Neck supple. No JVD present.  Cardiovascular: Normal rate, regular rhythm and normal heart sounds.  Exam reveals no gallop and no  friction rub.   No murmur heard. Pulmonary/Chest: Effort normal and breath sounds normal. No respiratory distress. No wheezes.No rales.  Abdominal: Soft. Bowel sounds are normal. No distension. There is no tenderness. There is no guarding.  Musculoskeletal: No edema or tenderness.  Lymphadenopathy: No cervical, axillary or supraclavicular adenopathy.  Neurological: Alert and oriented to person, place, and time. No cranial nerve deficit.  Skin: Skin is warm and dry. No rash noted. No erythema. No pallor.  Psychiatric: Affect and judgment normal.   Labs No visits with results within 3 Day(s) from this visit.  Latest known visit with results is:  Appointment on 12/14/2018  Component Date Value Ref Range Status  . WBC 12/14/2018 7.4  4.0 - 10.5 K/uL Final  . RBC 12/14/2018 4.12  3.87 - 5.11 MIL/uL Final  . Hemoglobin 12/14/2018 11.3* 12.0 - 15.0 g/dL Final  . HCT 12/14/2018 38.2  36.0 - 46.0 % Final  . MCV 12/14/2018 92.7  80.0 - 100.0 fL Final  . MCH 12/14/2018 27.4  26.0 - 34.0 pg Final  . MCHC 12/14/2018 29.6* 30.0 - 36.0 g/dL Final  . RDW 12/14/2018 19.7* 11.5 - 15.5 % Final  . Platelets 12/14/2018 507* 150 - 400 K/uL Final  . nRBC 12/14/2018 0.0  0.0 - 0.2 % Final  . Neutrophils Relative % 12/14/2018 67  % Final  . Neutro Abs 12/14/2018 4.9  1.7 - 7.7 K/uL Final  . Lymphocytes Relative 12/14/2018 24  % Final  . Lymphs Abs 12/14/2018 1.7  0.7 - 4.0 K/uL Final  . Monocytes Relative 12/14/2018 7  % Final  . Monocytes Absolute 12/14/2018 0.5  0.1 - 1.0 K/uL Final  . Eosinophils Relative 12/14/2018 2  % Final  . Eosinophils Absolute 12/14/2018 0.1  0.0 - 0.5 K/uL Final  . Basophils Relative 12/14/2018 0  % Final  . Basophils Absolute 12/14/2018 0.0  0.0 - 0.1 K/uL Final  . Immature Granulocytes 12/14/2018 0  % Final  . Abs Immature Granulocytes 12/14/2018 0.02  0.00 - 0.07 K/uL Final   Performed at [facility], [address] [place], [place] [address]  . Sodium 12/14/2018 139  135  - 145 mmol/L Final  . Potassium 12/14/2018 3.9  3.5 - 5.1 mmol/L Final  . Chloride 12/14/2018 103  98 - 111 mmol/L Final  . CO2 12/14/2018 26  22 - 32 mmol/L Final  . Glucose, Bld 12/14/2018 103* 70 - 99 mg/dL Final  . BUN 12/14/2018 21  8 - 23 mg/dL Final  . Creatinine, Ser 12/14/2018 0.89  0.44 - 1.00 mg/dL Final  . Calcium 12/14/2018 9.2  8.9 - 10.3 mg/dL Final  . Total Protein 12/14/2018 7.6  6.5 - 8.1 g/dL Final  . Albumin 12/14/2018 3.9  3.5 - 5.0 g/dL Final  . AST 12/14/2018 19  15 - 41 U/L Final  . ALT 12/14/2018 14  0 - 44 U/L Final  . Alkaline Phosphatase 12/14/2018 91  38 - 126 U/L Final  . Total Bilirubin 12/14/2018 0.5  0.3 - 1.2 mg/dL Final  . GFR calc non Af Amer 12/14/2018 60* >60 mL/min Final  . GFR calc Af Amer 12/14/2018 >60  >60 mL/min Final  . Anion gap 12/14/2018 10  5 - 15 Final   Performed at [facility], [address], [place], [place] [address]  . LDH 12/14/2018 148  98 - 192 U/L Final   Performed at [facility], [address] [place], [place] [address]  . Total Protein ELP 12/14/2018 6.9  6.0 - 8.5 g/dL Final  . Albumin ELP 12/14/2018 3.5  2.9 - 4.4 g/dL Final  . Alpha-1-Globulin 12/14/2018 0.3  0.0 - 0.4 g/dL Final  . Alpha-2-Globulin 12/14/2018 1.1* 0.4 - 1.0 g/dL Final  . Beta Globulin 12/14/2018 1.2  0.7 - 1.3 g/dL Final  . Gamma Globulin 12/14/2018 0.7  0.4 - 1.8 g/dL Final  . M-Spike, % 12/14/2018 Not Observed  Not Observed g/dL Final  . SPE Interp. 12/14/2018 Comment   Final   Comment: (NOTE) The SPE pattern is suggestive of a subacute inflammatory response. This condition represents an intermediate stage between two possible courses for  acute inflammation: total convalescense with a return to normal, or the onset of a chronic inflammatory condition. Performed At: [facility] [place], [place] [id number] Rush Farmer MD OM:[id number]   . Comment 12/14/2018 Comment   Final   Comment: (NOTE) Protein electrophoresis  scan will follow via computer, mail, or courier delivery.   . Globulin, Total 12/14/2018 3.4  2.2 - 3.9 g/dL Corrected  . A/G Ratio 12/14/2018 1.0  0.7 - 1.7 Corrected  . Ferritin 12/14/2018 103  11 - 307 ng/mL Final   Performed at [facility], [address], [place], [place] [address]  . Haptoglobin 12/14/2018 350* 41 - 333 mg/dL Final   Comment: (NOTE) Performed At: [facility] [place], [place] [id number] Rush Farmer MD HW:[id number]   . Vitamin B-12 12/14/2018 141* 180 - 914 pg/mL Final   Comment: (NOTE) This assay is not validated for testing neonatal or myeloproliferative syndrome specimens for Vitamin B12 levels. Performed at [facility], [address] [place], [place] [address]   . Folate 12/14/2018 11.6  >5.9 ng/mL Final   Performed at [facility], [address] [place], [place] [address]  . Methylmalonic Acid, Quantitative 12/14/2018 479* 0 - 378 nmol/L Final  . Disclaimer: 12/14/2018 Comment   Final   Comment: (NOTE) This test was developed and its performance characteristics determined by [facility]. It has not been cleared or approved by the Food and Drug Administration. Performed At: [facility] [place], [place] [id number] Rush Farmer MD SL:[id number]      Pathology Orders Placed This Encounter  Procedures  . CBC with Differential    Standing Status:   Future    Standing Expiration Date:   01/05/2020  . Comprehensive metabolic panel    Standing Status:   Future    Standing Expiration Date:   01/05/2020  . Lactate dehydrogenase    Standing Status:   Future    Standing Expiration Date:   01/05/2020  . Vitamin B12    Standing Status:   Future    Standing Expiration Date:   01/05/2020  . Methylmalonic acid, serum    Standing Status:   Future    Standing Expiration Date:   01/05/2020  . Ferritin    Standing Status:   Future    Standing Expiration Date:   01/05/2020       Zoila Shutter MD

## 2019-02-02 ENCOUNTER — Ambulatory Visit (HOSPITAL_COMMUNITY): Payer: Medicare Other

## 2019-03-06 ENCOUNTER — Ambulatory Visit (HOSPITAL_COMMUNITY): Payer: Medicare Other

## 2019-04-05 ENCOUNTER — Ambulatory Visit (HOSPITAL_COMMUNITY): Payer: Medicare Other

## 2019-05-05 ENCOUNTER — Encounter (HOSPITAL_COMMUNITY): Payer: Self-pay

## 2019-05-05 ENCOUNTER — Inpatient Hospital Stay (HOSPITAL_COMMUNITY): Payer: Medicare Other | Attending: Nurse Practitioner

## 2019-05-05 ENCOUNTER — Other Ambulatory Visit: Payer: Self-pay

## 2019-05-05 VITALS — BP 142/63 | HR 70 | Temp 98.0°F | Resp 18

## 2019-05-05 DIAGNOSIS — E538 Deficiency of other specified B group vitamins: Secondary | ICD-10-CM

## 2019-05-05 MED ORDER — CYANOCOBALAMIN 1000 MCG/ML IJ SOLN
1000.0000 ug | Freq: Once | INTRAMUSCULAR | Status: AC
  Administered 2019-05-05: 09:00:00 1000 ug via INTRAMUSCULAR
  Filled 2019-05-05: qty 1

## 2019-05-05 NOTE — Progress Notes (Signed)
Patient tolerated injection with no complaints voiced.  Site clean and dry with no bruising or swelling noted at site.  Band aid applied.  Vss with discharge and left ambulatory with no s/s of distress noted.  

## 2019-06-02 ENCOUNTER — Other Ambulatory Visit: Payer: Self-pay

## 2019-06-02 ENCOUNTER — Encounter (HOSPITAL_COMMUNITY): Payer: Self-pay

## 2019-06-02 ENCOUNTER — Inpatient Hospital Stay (HOSPITAL_COMMUNITY): Payer: Medicare Other | Attending: Nurse Practitioner

## 2019-06-02 VITALS — BP 149/70 | HR 66 | Temp 98.0°F | Resp 18

## 2019-06-02 DIAGNOSIS — E538 Deficiency of other specified B group vitamins: Secondary | ICD-10-CM | POA: Diagnosis not present

## 2019-06-02 MED ORDER — CYANOCOBALAMIN 1000 MCG/ML IJ SOLN
1000.0000 ug | Freq: Once | INTRAMUSCULAR | Status: AC
  Administered 2019-06-02: 1000 ug via INTRAMUSCULAR
  Filled 2019-06-02: qty 1

## 2019-06-02 NOTE — Progress Notes (Signed)
Wanda [name] presents today for injection per the provider's orders.  B12 administration without incident; see MAR for injection details.  Patient tolerated procedure well and without incident.  No questions or complaints noted at this time. Pt d/c ambulatory

## 2019-06-23 ENCOUNTER — Other Ambulatory Visit (HOSPITAL_COMMUNITY): Payer: Self-pay | Admitting: *Deleted

## 2019-06-23 DIAGNOSIS — D508 Other iron deficiency anemias: Secondary | ICD-10-CM

## 2019-06-23 DIAGNOSIS — E538 Deficiency of other specified B group vitamins: Secondary | ICD-10-CM

## 2019-06-26 ENCOUNTER — Other Ambulatory Visit: Payer: Self-pay

## 2019-06-26 ENCOUNTER — Inpatient Hospital Stay (HOSPITAL_COMMUNITY): Payer: Medicare Other | Attending: Hematology

## 2019-06-26 DIAGNOSIS — E538 Deficiency of other specified B group vitamins: Secondary | ICD-10-CM

## 2019-06-26 DIAGNOSIS — D508 Other iron deficiency anemias: Secondary | ICD-10-CM

## 2019-06-26 DIAGNOSIS — D509 Iron deficiency anemia, unspecified: Secondary | ICD-10-CM | POA: Insufficient documentation

## 2019-06-26 LAB — CBC WITH DIFFERENTIAL/PLATELET
Abs Immature Granulocytes: 0.03 10*3/uL (ref 0.00–0.07)
Basophils Absolute: 0.1 10*3/uL (ref 0.0–0.1)
Basophils Relative: 1 %
Eosinophils Absolute: 0.2 10*3/uL (ref 0.0–0.5)
Eosinophils Relative: 2 %
HCT: 37.4 % (ref 36.0–46.0)
Hemoglobin: 12.1 g/dL (ref 12.0–15.0)
Immature Granulocytes: 0 %
Lymphocytes Relative: 27 %
Lymphs Abs: 2.2 10*3/uL (ref 0.7–4.0)
MCH: 31.8 pg (ref 26.0–34.0)
MCHC: 32.4 g/dL (ref 30.0–36.0)
MCV: 98.4 fL (ref 80.0–100.0)
Monocytes Absolute: 0.5 10*3/uL (ref 0.1–1.0)
Monocytes Relative: 6 %
Neutro Abs: 5.3 10*3/uL (ref 1.7–7.7)
Neutrophils Relative %: 64 %
Platelets: 319 10*3/uL (ref 150–400)
RBC: 3.8 MIL/uL — ABNORMAL LOW (ref 3.87–5.11)
RDW: 12 % (ref 11.5–15.5)
WBC: 8.2 10*3/uL (ref 4.0–10.5)
nRBC: 0 % (ref 0.0–0.2)

## 2019-06-26 LAB — COMPREHENSIVE METABOLIC PANEL
ALT: 15 U/L (ref 0–44)
AST: 23 U/L (ref 15–41)
Albumin: 4.3 g/dL (ref 3.5–5.0)
Alkaline Phosphatase: 81 U/L (ref 38–126)
Anion gap: 10 (ref 5–15)
BUN: 19 mg/dL (ref 8–23)
CO2: 26 mmol/L (ref 22–32)
Calcium: 9.5 mg/dL (ref 8.9–10.3)
Chloride: 102 mmol/L (ref 98–111)
Creatinine, Ser: 0.69 mg/dL (ref 0.44–1.00)
GFR calc Af Amer: >60 mL/min (ref >60)
GFR calc non Af Amer: >60 mL/min (ref >60)
Glucose, Bld: 93 mg/dL (ref 70–99)
Potassium: 4.2 mmol/L (ref 3.5–5.1)
Sodium: 138 mmol/L (ref 135–145)
Total Bilirubin: 1.6 mg/dL — ABNORMAL HIGH (ref 0.3–1.2)
Total Protein: 7.1 g/dL (ref 6.5–8.1)

## 2019-06-26 LAB — FERRITIN: Ferritin: 86 ng/mL (ref 11–307)

## 2019-06-26 LAB — VITAMIN B12: Vitamin B-12: 337 pg/mL (ref 180–914)

## 2019-06-26 LAB — LACTATE DEHYDROGENASE: LDH: 145 U/L (ref 98–192)

## 2019-06-28 ENCOUNTER — Encounter: Payer: Self-pay | Admitting: Gastroenterology

## 2019-06-28 ENCOUNTER — Ambulatory Visit (INDEPENDENT_AMBULATORY_CARE_PROVIDER_SITE_OTHER): Payer: Medicare Other | Admitting: Gastroenterology

## 2019-06-28 ENCOUNTER — Other Ambulatory Visit: Payer: Self-pay

## 2019-06-28 VITALS — BP 119/80 | HR 71 | Temp 96.8°F | Ht 60.0 in | Wt 113.0 lb

## 2019-06-28 DIAGNOSIS — R634 Abnormal weight loss: Secondary | ICD-10-CM | POA: Diagnosis not present

## 2019-06-28 DIAGNOSIS — D508 Other iron deficiency anemias: Secondary | ICD-10-CM | POA: Diagnosis not present

## 2019-06-28 LAB — METHYLMALONIC ACID, SERUM: Methylmalonic Acid, Quantitative: 177 nmol/L (ref 0–378)

## 2019-06-28 NOTE — Progress Notes (Signed)
Referring Provider: Sander Radon, NP Primary Care Physician:  Sander Radon, NP Primary GI: Dr. Gala Romney   Chief Complaint  Patient presents with  . Anemia    feels tired    HPI:   Wanda [name] is an 85 y.o. female presenting today with a history of IDA, inpatient beginning of year with anemia. EGD completed with normal esophagus, large hiatal hernia, erosive gastropathy s/p biopsy, normal duodenum. Reactive gastropathy. Last colonoscopy in 2013 with colonic diverticulosis. Prior evaluation for IDA with capsule study 2013 without explanation for IDA.  Recently Hgb 12.1. Ferritin 86. Feels tired all the time. Eating plenty. Some early satiety. Weight loss documented. No abdominal pain. No energy. In mornings will feel like she has energy. After breakfast will feel sleepy and has to go lay down. Hair is thinning. Brushful of hair while combing at times. No rectal bleeding, no changes in bowel habits. Doesn't want to pursue colonoscopy. No depression.    Past Medical History:  Diagnosis Date  . Anemia   . Cystocele   . Diverticulosis 10/20/10   Left-sided on colonoscopy by Dr. Gala Romney 10/20/10  . Gastric polyps 10/02/2013  . Gastritis 12/08/10   EGD by Dr. Altha Harm hiatal hernia, duodenal diverticulum, chronic gastritis  . GERD (gastroesophageal reflux disease)   . Heart murmur   . Hiatal hernia 10/02/2013  . HTN (hypertension)   . IDA (iron deficiency anemia)   . 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 Osteoarthritis   . Radial fracture    (right) Undergoing treatment by Dr. Aline Brochure currently  . Sleep apnea    STOP BANG SCORE 4  . Urge incontinence   . Vitamin D deficiency     Past Surgical History:  Procedure Laterality Date  . ABDOMINAL HYSTERECTOMY    . ANTERIOR AND POSTERIOR REPAIR  05/17/2012   Procedure: ANTERIOR (CYSTOCELE) AND POSTERIOR REPAIR (RECTOCELE);  Surgeon: Jonnie Kind, MD;  Location: [facility];  Service: Gynecology;  Laterality: N/A;   . BIOPSY  11/22/2018   Procedure: BIOPSY;  Surgeon: Daneil Dolin, MD;  [facility]: [facility];  Service: Endoscopy;;  . COLONOSCOPY  02/29/2012   Dr. Gala Romney: colonic diverticulosis, minimal internal hemorrhoids, benign polyp  . ESOPHAGOGASTRODUODENOSCOPY  02/29/2012   Dr. Gala Romney: atonic esophagus, moderate-sized hiatal hernia, fundal gland polyps  . ESOPHAGOGASTRODUODENOSCOPY N/A 10/02/2013   Dr. Gala Romney- multiple Wilhemena Durie gastric polyps- not manipulated  . ESOPHAGOGASTRODUODENOSCOPY N/A 01/14/2015   Dr. Gala Romney: Large hiatal hernia, otherwise normal EGD  . ESOPHAGOGASTRODUODENOSCOPY (EGD) WITH PROPOFOL N/A 11/22/2018   Dr. Gala Romney: normal esophagus, large hiatal hernia, erosive gastropathy s/p biopsy, normal duodenum. Reactive gastropathy  . GIVENS CAPSULE STUDY  02/29/2012   No explanation for IDA. Possible extrinsic compression but negative CT.   Marland Kitchen SALPINGOOPHORECTOMY  05/17/2012   Procedure: SALPINGO OOPHERECTOMY;  Surgeon: Jonnie Kind, MD;  Location: [facility];  Service: Gynecology;  Laterality: Bilateral;  . VAGINAL HYSTERECTOMY  05/17/2012   Procedure: HYSTERECTOMY VAGINAL;  Surgeon: Jonnie Kind, MD;  Location: [facility];  Service: Gynecology;  Laterality: N/A;    Current Outpatient Medications  Medication Sig Dispense Refill  . CALCIUM PO Take 300-600 mg by mouth daily. Has to be cut in half to take    . ferrous sulfate 325 (65 FE) MG tablet Take 1 tablet (325 mg total) by mouth daily with breakfast. 30 tablet 3  . lisinopril-hydrochlorothiazide (PRINZIDE,ZESTORETIC) 20-12.5 MG per tablet Take 1 tablet by mouth daily.    . pantoprazole (  PROTONIX) 40 MG tablet Take 1 tablet (40 mg total) by mouth 2 (two) times daily. (Patient taking differently: Take 40 mg by mouth daily. ) 60 tablet 3   No current facility-administered medications for this visit.     Allergies as of 06/28/2019 - Review Complete 06/28/2019  Allergen Reaction Noted  . Sulfa antibiotics  06/23/2016  . Naproxen  sodium Rash and Other (See Comments)     Family History  Problem Relation Age of Onset  . Hypertension Mother   . Lymphoma Brother   . Anesthesia problems Neg Hx   . Hypotension Neg Hx   . Malignant hyperthermia Neg Hx   . Pseudochol deficiency Neg Hx   . Colon cancer Neg Hx     Social History   Socioeconomic History  . Marital status: Widowed    Spouse name: Not on file  . Number of children: Not on file  . Years of education: Not on file  . Highest education level: Not on file  Occupational History  . Occupation: Retired    Fish farm manager: RETIRED  Social Needs  . Financial resource strain: Not on file  . Food insecurity    Worry: Not on file    Inability: Not on file  . Transportation needs    Medical: Not on file    Non-medical: Not on file  Tobacco Use  . Smoking status: Never Smoker  . Smokeless tobacco: Never Used  . Tobacco comment: Never smoked  Substance and Sexual Activity  . Alcohol use: No    Alcohol/week: 0.0 standard drinks  . Drug use: No  . Sexual activity: Not Currently    Birth control/protection: Surgical  Lifestyle  . Physical activity    Days per week: Not on file    Minutes per session: Not on file  . Stress: Not on file  Relationships  . Social Herbalist on phone: Not on file    Gets together: Not on file    Attends religious service: Not on file    Active member of club or organization: Not on file    Attends meetings of clubs or organizations: Not on file    Relationship status: Not on file  Other Topics Concern  . Not on file  Social History Narrative  . Not on file    Review of Systems: Gen: see HPI CV: Denies chest pain, palpitations, syncope, peripheral edema, and claudication. Resp: Denies dyspnea at rest, cough, wheezing, coughing up blood, and pleurisy. GI: see HPI Derm: Denies rash, itching, dry skin Psych: Denies depression, anxiety, memory loss, confusion. No homicidal or suicidal ideation.  Heme: Denies  bruising, bleeding, and enlarged lymph nodes.  Physical Exam: BP 119/80   Pulse 71   Temp (!) 96.8 F (36 C) (Temporal)   Ht 5' (1.524 m)   Wt 113 lb (51.3 kg)   BMI 22.07 kg/m  General:   Alert and oriented. No distress noted. Pleasant and cooperative.  Head:  Normocephalic and atraumatic. Eyes:  Conjuctiva clear without scleral icterus. Mouth:  Oral mucosa pink and moist.  Abdomen:  +BS, soft, non-tender and non-distended. No rebound or guarding. No HSM or masses noted. Msk:  Slight kyphosis  Extremities:  Without edema. Neurologic:  Alert and  oriented x4 Psych:  Alert and cooperative. Normal mood and affect.

## 2019-06-28 NOTE — Patient Instructions (Signed)
We will check to see if we can just add the thyroid lab test to the blood already in the lab at the hospital. If not, you can have this drawn at your doctor's office!  We will see you in 3 months.   Please call if persisting weight loss!  It was great to see you again!  I enjoyed seeing you again today! As you know, I value our relationship and want to provide genuine, compassionate, and quality care. I welcome your feedback. If you receive a survey regarding your visit,  I greatly appreciate you taking time to fill this out. See you next time!  Annitta Needs, PhD, ANP-BC [facility]

## 2019-06-28 NOTE — Assessment & Plan Note (Signed)
Chronic history with acute on chronic earlier this year, s/p EGD. Remains on once daily PPI and followed by Hematology. Prior evaluation for IDA in 2013 including capsule study. No overt GI bleeding. Hgb improved, and ferritin with slight drift from months ago. Return in 3 months.

## 2019-06-28 NOTE — Assessment & Plan Note (Addendum)
Slow trend in weight loss over the past year. Vague symptoms of early satiety. Fatigue noted, and she reports hair thinning more than normal. Will check TSH. Hopefully, we can add this to the blood in lab completed recently. If not, she will have done through PCP. Declining colonoscopy or imaging at this point. 3 month return.   Addendum: Received TSH from July 2020. TSH 1.29.

## 2019-07-03 ENCOUNTER — Other Ambulatory Visit: Payer: Self-pay

## 2019-07-03 ENCOUNTER — Inpatient Hospital Stay (HOSPITAL_BASED_OUTPATIENT_CLINIC_OR_DEPARTMENT_OTHER): Payer: Medicare Other | Admitting: Hematology

## 2019-07-03 ENCOUNTER — Inpatient Hospital Stay (HOSPITAL_COMMUNITY): Payer: Medicare Other

## 2019-07-03 ENCOUNTER — Encounter (HOSPITAL_COMMUNITY): Payer: Self-pay | Admitting: Hematology

## 2019-07-03 DIAGNOSIS — D508 Other iron deficiency anemias: Secondary | ICD-10-CM

## 2019-07-03 DIAGNOSIS — E538 Deficiency of other specified B group vitamins: Secondary | ICD-10-CM

## 2019-07-03 DIAGNOSIS — D509 Iron deficiency anemia, unspecified: Secondary | ICD-10-CM | POA: Diagnosis not present

## 2019-07-03 MED ORDER — CYANOCOBALAMIN 1000 MCG/ML IJ SOLN
1000.0000 ug | Freq: Once | INTRAMUSCULAR | Status: AC
  Administered 2019-07-03: 1000 ug via INTRAMUSCULAR

## 2019-07-03 MED ORDER — CYANOCOBALAMIN 1000 MCG/ML IJ SOLN
INTRAMUSCULAR | Status: AC
  Filled 2019-07-03: qty 1

## 2019-07-03 NOTE — Patient Instructions (Signed)
[facility] at [facility]  Discharge Instructions:   _______________________________________________________________  Thank you for choosing [facility] at [facility] to provide your oncology and hematology care.  To afford each patient quality time with our providers, please arrive at least 15 minutes before your scheduled appointment.  You need to re-schedule your appointment if you arrive 10 or more minutes late.  We strive to give you quality time with our providers, and arriving late affects you and other patients whose appointments are after yours.  Also, if you no show three or more times for appointments you may be dismissed from the clinic.  Again, thank you for choosing [facility]. Our hope is that these requests will allow you access to exceptional care and in a timely manner. _______________________________________________________________  If you have questions after your visit, please contact our office at [phone] between the hours of 8:30 a.m. and 5:00 p.m. Voicemails left after 4:30 p.m. will not be returned until the following business day. _______________________________________________________________  For prescription refill requests, have your pharmacy contact our office. _______________________________________________________________  Recommendations made by the consultant and any test results will be sent to your referring physician. _______________________________________________________________ 

## 2019-07-03 NOTE — Progress Notes (Signed)
Wanda [name] presents today for injection per MD orders. B12 administered IM  in right deltoid. Administration without incident. Patient tolerated well.  No complaints at this time. Discharged from clinic ambulatory. F/U with [facility] as scheduled.

## 2019-07-03 NOTE — Progress Notes (Signed)
[place], [place] [address]   CLINIC:  Medical Oncology/Hematology  PCP:  Sander Radon, NP [place] [address] [phone]   REASON FOR VISIT:  Follow-up for IDA  CURRENT THERAPY: Clinical surveillance    INTERVAL HISTORY:  Wanda [name] 85 y.o. female presents today for follow-up.  Reports overall doing well.  Denies any significant fatigue.  Denies any obvious signs of bleeding.  Wanda [name] states Wanda [name] has had some episodes of fatigue since Wanda [name] last visit but lately Wanda [name] fatigue has been improving.  Wanda [name] states Wanda [name] is incorporating more meat into Wanda [name] diet.  Wanda [name] denies any changes in bowel habits.  No weight loss.  No change in appetite.  Denies any chest pain, shortness of breath, lightheadedness or dizziness.  No fevers, chills, or night sweats.  Wanda [name] is here for repeat labs and office visit.   REVIEW OF SYSTEMS:  Review of Systems  Constitutional: Positive for fatigue.  HENT:  Negative.   Eyes: Negative.   Respiratory: Negative.   Cardiovascular: Negative.   Gastrointestinal: Negative.   Genitourinary: Negative.    Musculoskeletal: Positive for arthralgias.  Skin: Negative.   Neurological: Negative.   Hematological: Negative.   Psychiatric/Behavioral: Negative.      PAST MEDICAL/SURGICAL HISTORY:  Past Medical History:  Diagnosis Date  . Anemia   . Cystocele   . Diverticulosis 10/20/10   Left-sided on colonoscopy by Dr. Gala Romney 10/20/10  . Gastric polyps 10/02/2013  . Gastritis 12/08/10   EGD by Dr. Altha Harm hiatal hernia, duodenal diverticulum, chronic gastritis  . GERD (gastroesophageal reflux disease)   . Heart murmur   . Hiatal hernia 10/02/2013  . HTN (hypertension)   . IDA (iron deficiency anemia)   . 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 Osteoarthritis   . Radial fracture    (right) Undergoing treatment by Dr. Aline Brochure currently  . Sleep apnea    STOP BANG SCORE 4  . Urge incontinence   .  Vitamin D deficiency    Past Surgical History:  Procedure Laterality Date  . ABDOMINAL HYSTERECTOMY    . ANTERIOR AND POSTERIOR REPAIR  05/17/2012   Procedure: ANTERIOR (CYSTOCELE) AND POSTERIOR REPAIR (RECTOCELE);  Surgeon: Jonnie Kind, MD;  Location: [facility];  Service: Gynecology;  Laterality: N/A;  . BIOPSY  11/22/2018   Procedure: BIOPSY;  Surgeon: Daneil Dolin, MD;  Location: [facility];  Service: Endoscopy;;  . COLONOSCOPY  02/29/2012   Dr. Gala Romney: colonic diverticulosis, minimal internal hemorrhoids, benign polyp  . ESOPHAGOGASTRODUODENOSCOPY  02/29/2012   Dr. Gala Romney: atonic esophagus, moderate-sized hiatal hernia, fundal gland polyps  . ESOPHAGOGASTRODUODENOSCOPY N/A 10/02/2013   Dr. Gala Romney- multiple Wilhemena Durie gastric polyps- not manipulated  . ESOPHAGOGASTRODUODENOSCOPY N/A 01/14/2015   Dr. Gala Romney: Large hiatal hernia, otherwise normal EGD  . ESOPHAGOGASTRODUODENOSCOPY (EGD) WITH PROPOFOL N/A 11/22/2018   Dr. Gala Romney: normal esophagus, large hiatal hernia, erosive gastropathy s/p biopsy, normal duodenum. Reactive gastropathy  . GIVENS CAPSULE STUDY  02/29/2012   No explanation for IDA. Possible extrinsic compression but negative CT.   Marland Kitchen SALPINGOOPHORECTOMY  05/17/2012   Procedure: SALPINGO OOPHERECTOMY;  Surgeon: Jonnie Kind, MD;  Location: [facility];  Service: Gynecology;  Laterality: Bilateral;  . VAGINAL HYSTERECTOMY  05/17/2012   Procedure: HYSTERECTOMY VAGINAL;  Surgeon: Jonnie Kind, MD;  Location: [facility];  Service: Gynecology;  Laterality: N/A;     SOCIAL HISTORY:  Social History   Socioeconomic History  . Marital status: Widowed  Spouse name: Not on file  . Number of children: Not on file  . Years of education: Not on file  . Highest education level: Not on file  Occupational History  . Occupation: Retired    Fish farm manager: RETIRED  Social Needs  . Financial resource strain: Not on file  . Food insecurity    Worry: Not on file    Inability: Not on file   . Transportation needs    Medical: Not on file    Non-medical: Not on file  Tobacco Use  . Smoking status: Never Smoker  . Smokeless tobacco: Never Used  . Tobacco comment: Never smoked  Substance and Sexual Activity  . Alcohol use: No    Alcohol/week: 0.0 standard drinks  . Drug use: No  . Sexual activity: Not Currently    Birth control/protection: Surgical  Lifestyle  . Physical activity    Days per week: Not on file    Minutes per session: Not on file  . Stress: Not on file  Relationships  . Social Herbalist on phone: Not on file    Gets together: Not on file    Attends religious service: Not on file    Active member of club or organization: Not on file    Attends meetings of clubs or organizations: Not on file    Relationship status: Not on file  . Intimate partner violence    Fear of current or ex partner: Not on file    Emotionally abused: Not on file    Physically abused: Not on file    Forced sexual activity: Not on file  Other Topics Concern  . Not on file  Social History Narrative  . Not on file    FAMILY HISTORY:  Family History  Problem Relation Age of Onset  . Hypertension Mother   . Lymphoma Brother   . Anesthesia problems Neg Hx   . Hypotension Neg Hx   . Malignant hyperthermia Neg Hx   . Pseudochol deficiency Neg Hx   . Colon cancer Neg Hx     CURRENT MEDICATIONS:  Outpatient Encounter Medications as of 07/03/2019  Medication Sig  . CALCIUM PO Take 300-600 mg by mouth daily. Has to be cut in half to take  . lisinopril-hydrochlorothiazide (PRINZIDE,ZESTORETIC) 20-12.5 MG per tablet Take 1 tablet by mouth daily.  . pantoprazole (PROTONIX) 40 MG tablet Take 40 mg by mouth daily.  . ferrous sulfate 325 (65 FE) MG tablet Take 1 tablet (325 mg total) by mouth daily with breakfast.  . pantoprazole (PROTONIX) 40 MG tablet Take 1 tablet (40 mg total) by mouth 2 (two) times daily. (Wanda [name] taking differently: Take 40 mg by mouth daily. )   No  facility-administered encounter medications on file as of 07/03/2019.     ALLERGIES:  Allergies  Allergen Reactions  . Sulfa Antibiotics     unknown  . Naproxen Sodium Rash and Other (See Comments)    Passed out     PHYSICAL EXAM:  ECOG Performance status: 2  Vitals:   07/03/19 1320  BP: (!) 145/71  Pulse: 66  Resp: 18  Temp: (!) 97.3 F (36.3 C)  SpO2: 100%   Filed Weights   07/03/19 1320  Weight: 113 lb 14.4 oz (51.7 kg)    Physical Exam Constitutional:      Appearance: Normal appearance.  HENT:     Head: Normocephalic and atraumatic.     Right Ear: External ear normal.  Left Ear: External ear normal.     Nose: Nose normal.     Mouth/Throat:     Mouth: Mucous membranes are moist.     Pharynx: Oropharynx is clear.  Eyes:     Extraocular Movements: Extraocular movements intact.     Conjunctiva/sclera: Conjunctivae normal.  Neck:     Musculoskeletal: Normal range of motion.  Cardiovascular:     Rate and Rhythm: Normal rate and regular rhythm.     Pulses: Normal pulses.     Heart sounds: Normal heart sounds.  Pulmonary:     Effort: Pulmonary effort is normal.     Breath sounds: Normal breath sounds.  Abdominal:     General: Bowel sounds are normal.     Palpations: Abdomen is soft.  Musculoskeletal: Normal range of motion.  Skin:    General: Skin is warm and dry.  Neurological:     Mental Status: Wanda [name] is alert and oriented to person, place, and time. Mental status is at baseline.  Psychiatric:        Mood and Affect: Mood normal.        Behavior: Behavior normal.        Thought Content: Thought content normal.        Judgment: Judgment normal.      LABORATORY DATA:  I have reviewed the labs as listed.  CBC    Component Value Date/Time   WBC 8.2 06/26/2019 1202   RBC 3.80 (L) 06/26/2019 1202   HGB 12.1 06/26/2019 1202   HGB 12.4 02/21/2014 1328   HCT 37.4 06/26/2019 1202   HCT 37.2 02/21/2014 1328   PLT 319 06/26/2019 1202   PLT 273  02/21/2014 1328   MCV 98.4 06/26/2019 1202   MCV 93 02/21/2014 1328   MCH 31.8 06/26/2019 1202   MCHC 32.4 06/26/2019 1202   RDW 12.0 06/26/2019 1202   RDW 15.0 (H) 02/21/2014 1328   LYMPHSABS 2.2 06/26/2019 1202   LYMPHSABS 2.1 02/21/2014 1328   MONOABS 0.5 06/26/2019 1202   MONOABS 0.5 02/21/2014 1328   EOSABS 0.2 06/26/2019 1202   EOSABS 0.2 02/21/2014 1328   BASOSABS 0.1 06/26/2019 1202   BASOSABS 0.1 02/21/2014 1328   CMP Latest Ref Rng & Units 06/26/2019 12/14/2018 11/22/2018  Glucose 70 - 99 mg/dL 93 103(H) 89  BUN 8 - 23 mg/dL 19 21 17   Creatinine 0.44 - 1.00 mg/dL 0.69 0.89 0.62  Sodium 135 - 145 mmol/L 138 139 137  Potassium 3.5 - 5.1 mmol/L 4.2 3.9 3.6  Chloride 98 - 111 mmol/L 102 103 105  CO2 22 - 32 mmol/L 26 26 24   Calcium 8.9 - 10.3 mg/dL 9.5 9.2 8.4(L)  Total Protein 6.5 - 8.1 g/dL 7.1 7.6 -  Total Bilirubin 0.3 - 1.2 mg/dL 1.6(H) 0.5 -  Alkaline Phos 38 - 126 U/L 81 91 -  AST 15 - 41 U/L 23 19 -  ALT 0 - 44 U/L 15 14 -           ASSESSMENT & PLAN:   Iron deficiency anemia 1.  Iron deficiency anemia -  Developed abdominal pain and was seen at ER with labs done on 11/04/2018 showing WBC 9.8 HB 5.9 Plts 440,000.  Ferritin was decreased at 7.  Pt was transfused with 2 units PRBCs with improvement to 8 range. Received IV Feraheme as well during admission.  - Pt had EGD in 2016 by Dr. Gala Romney with large hiatal hernia, otherwise normal. Previously noted multiple [name] lesions in  2014. Capsule study 2013 without explanation for IDA. Last colonoscopy in 2013 with colonic diverticulosis. - Pt was recently hospitalized 11/20/2018 to 11/22/2018 for anemia. - EGD 11/22/2018 that showed  Normal esophagus, Large hiatal hernia,  Erosive gastropathy,  Normal duodenal bulb and second portion of the duodenum. Anemia likely multifactorial in etiology. -Wanda [name] last dose of IV iron was in December 2019.  Wanda [name] was also started on vitamin B12 injections in February 2020 secondary to  vitamin B12 deficiency. - Wanda [name] most recent labs from August 10 remained stable with a hemoglobin of 12.1, and ferritin level of 86.  Wanda [name] denies any obvious signs of bleeding.  We will continue to monitor Wanda [name] anemia. -Recommend Wanda [name] return to clinic in 4 months.  2.  Vitamin B12 deficiency -Wanda [name] was started on vitamin B12 thousand micrograms per mL IM injections in February 2020. - Vitamin B12 level remains on the low side of normal at 337.  Wanda [name] will proceed with injection today of vitamin B12.        Orders placed this encounter:  Orders Placed This Encounter  Procedures  . CBC with Differential  . Comprehensive metabolic panel  . Iron and TIBC  . Ferritin  . Vitamin B12  . [place], [place] [phone]

## 2019-07-03 NOTE — Assessment & Plan Note (Signed)
1.  Iron deficiency anemia -  Developed abdominal pain and was seen at ER with labs done on 11/04/2018 showing WBC 9.8 HB 5.9 Plts 440,000.  Ferritin was decreased at 7.  Pt was transfused with 2 units PRBCs with improvement to 8 range. Received IV Feraheme as well during admission.  - Pt had EGD in 2016 by Dr. Gala Romney with large hiatal hernia, otherwise normal. Previously noted multiple [name] lesions in 2014. Capsule study 2013 without explanation for IDA. Last colonoscopy in 2013 with colonic diverticulosis. - Pt was recently hospitalized 11/20/2018 to 11/22/2018 for anemia. - EGD 11/22/2018 that showed  Normal esophagus, Large hiatal hernia,  Erosive gastropathy,  Normal duodenal bulb and second portion of the duodenum. Anemia likely multifactorial in etiology. -Her last dose of IV iron was in December 2019.  Patient was also started on vitamin B12 injections in February 2020 secondary to vitamin B12 deficiency. - Her most recent labs from August 10 remained stable with a hemoglobin of 12.1, and ferritin level of 86.  She denies any obvious signs of bleeding.  We will continue to monitor her anemia. -Recommend patient return to clinic in 4 months.  2.  Vitamin B12 deficiency -Patient was started on vitamin B12 thousand micrograms per mL IM injections in February 2020. - Vitamin B12 level remains on the low side of normal at 337.  She will proceed with injection today of vitamin B12.

## 2019-07-04 ENCOUNTER — Telehealth: Payer: Self-pay | Admitting: Gastroenterology

## 2019-07-04 NOTE — Progress Notes (Signed)
cc'ed to pcp °

## 2019-07-04 NOTE — Telephone Encounter (Signed)
Wanda [name]: can we request TSH from PCP? I believe she was having this done there.

## 2019-07-10 NOTE — Telephone Encounter (Signed)
Requested from [facility]

## 2019-07-12 ENCOUNTER — Telehealth: Payer: Self-pay | Admitting: Gastroenterology

## 2019-07-12 NOTE — Telephone Encounter (Signed)
Alicia: please let patient know I received her TSH results from her PCP, and they are normal. Will see her upcoming as planned.

## 2019-07-12 NOTE — Telephone Encounter (Signed)
Pt notified of results and will f/u at her next apt.

## 2019-08-03 ENCOUNTER — Inpatient Hospital Stay (HOSPITAL_COMMUNITY): Payer: Medicare Other | Attending: Nurse Practitioner

## 2019-09-04 ENCOUNTER — Ambulatory Visit (HOSPITAL_COMMUNITY): Payer: Medicare Other

## 2019-09-25 ENCOUNTER — Telehealth: Payer: Self-pay | Admitting: Internal Medicine

## 2019-09-25 NOTE — Telephone Encounter (Signed)
Spoke with pt. Pt understand who she has an appointment with in 12/2019 and says she's sorry she had the receptionist cancel her apt for 09/28/2019. Pt states she got confused. Pt's appointment was r/s for 10/03/2019. AB, received lab work from pt's PCP in 06/2019 and asked pt to f/u with her as directed.

## 2019-09-25 NOTE — Telephone Encounter (Signed)
Pt called to cancel her OV with [place] for 09/28/2019. She is confused about her labs and when she needed to follow up with [place]. I told her she saw AB back in AUG and AB wanted her to follow up in 3 months which would be NOV. Pt said she thought she was to see [place] in FEB. I told her she has OV in FEB with another facility,but she doesn't know who they are. Please advise if patient needs OV after she has her labs done. [id number]

## 2019-09-28 ENCOUNTER — Ambulatory Visit: Payer: Medicare Other | Admitting: Gastroenterology

## 2019-10-02 NOTE — Progress Notes (Signed)
Referring Provider: Sander Radon, NP Primary Care Physician:  Sander Radon, NP Primary GI: Dr. Gala Romney  Chief Complaint  Patient presents with  . Anemia    doing ok    HPI:   Wanda [name] is a 85 y.o. female presenting today with a  history of IDA, inpatient beginning of year with anemia. EGD completed with normal esophagus, large hiatal hernia, erosive gastropathy s/p biopsy, normal duodenum. Reactive gastropathy. Last colonoscopy in 2013 with colonic diverticulosis. Prior evaluation for IDA with capsule study 2013 without explanation for IDA. Has declined colonoscopy. Followed by Hematology.   Sometimes burps up food but rare. Thinks may be eating too fast or too full. No solid food dysphagia. Chronic fatigue. No abdominal pain. No overt GI bleeding. Enjoying making Christmas cards. Protonix once daily.   Past Medical History:  Diagnosis Date  . Anemia   . Cystocele   . Diverticulosis 10/20/10   Left-sided on colonoscopy by Dr. Gala Romney 10/20/10  . Gastric polyps 10/02/2013  . Gastritis 12/08/10   EGD by Dr. Altha Harm hiatal hernia, duodenal diverticulum, chronic gastritis  . GERD (gastroesophageal reflux disease)   . Heart murmur   . Hiatal hernia 10/02/2013  . HTN (hypertension)   . IDA (iron deficiency anemia)   . 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 Osteoarthritis   . Radial fracture    (right) Undergoing treatment by Dr. Aline Brochure currently  . Sleep apnea    STOP BANG SCORE 4  . Urge incontinence   . Vitamin D deficiency     Past Surgical History:  Procedure Laterality Date  . ABDOMINAL HYSTERECTOMY    . ANTERIOR AND POSTERIOR REPAIR  05/17/2012   Procedure: ANTERIOR (CYSTOCELE) AND POSTERIOR REPAIR (RECTOCELE);  Surgeon: Jonnie Kind, MD;  Location: [facility];  Service: Gynecology;  Laterality: N/A;  . BIOPSY  11/22/2018   Procedure: BIOPSY;  Surgeon: Daneil Dolin, MD;  [facility];  Service: Endoscopy;;  . COLONOSCOPY   02/29/2012   Dr. Gala Romney: colonic diverticulosis, minimal internal hemorrhoids, benign polyp  . ESOPHAGOGASTRODUODENOSCOPY  02/29/2012   Dr. Gala Romney: atonic esophagus, moderate-sized hiatal hernia, fundal gland polyps  . ESOPHAGOGASTRODUODENOSCOPY N/A 10/02/2013   Dr. Gala Romney- multiple Wilhemena Durie gastric polyps- not manipulated  . ESOPHAGOGASTRODUODENOSCOPY N/A 01/14/2015   Dr. Gala Romney: Large hiatal hernia, otherwise normal EGD  . ESOPHAGOGASTRODUODENOSCOPY (EGD) WITH PROPOFOL N/A 11/22/2018   Dr. Gala Romney: normal esophagus, large hiatal hernia, erosive gastropathy s/p biopsy, normal duodenum. Reactive gastropathy  . GIVENS CAPSULE STUDY  02/29/2012   No explanation for IDA. Possible extrinsic compression but negative CT.   Marland Kitchen SALPINGOOPHORECTOMY  05/17/2012   Procedure: SALPINGO OOPHERECTOMY;  Surgeon: Jonnie Kind, MD;  Location: [facility];  Service: Gynecology;  Laterality: Bilateral;  . VAGINAL HYSTERECTOMY  05/17/2012   Procedure: HYSTERECTOMY VAGINAL;  Surgeon: Jonnie Kind, MD;  Location: [facility];  Service: Gynecology;  Laterality: N/A;    Current Outpatient Medications  Medication Sig Dispense Refill  . CALCIUM PO Take 300-600 mg by mouth daily. Sometimes doesn't take everyday    . ferrous sulfate 325 (65 FE) MG tablet Take 1 tablet (325 mg total) by mouth daily with breakfast. 30 tablet 3  . lisinopril-hydrochlorothiazide (PRINZIDE,ZESTORETIC) 20-12.5 MG per tablet Take 1 tablet by mouth daily.    . Multiple Vitamin (MULTIVITAMIN) tablet Take 1 tablet by mouth. Sometimes doesn't take everyday    . pantoprazole (PROTONIX) 40 MG tablet Take 1 tablet (40 mg total)  by mouth daily. 30 tablet 11   No current facility-administered medications for this visit.     Allergies as of 10/03/2019 - Review Complete 10/03/2019  Allergen Reaction Noted  . Sulfa antibiotics  06/23/2016  . Naproxen sodium Rash and Other (See Comments)     Family History  Problem Relation Age of Onset  . Hypertension  Mother   . Lymphoma Brother   . Anesthesia problems Neg Hx   . Hypotension Neg Hx   . Malignant hyperthermia Neg Hx   . Pseudochol deficiency Neg Hx   . Colon cancer Neg Hx     Social History   Socioeconomic History  . Marital status: Widowed    Spouse name: Not on file  . Number of children: Not on file  . Years of education: Not on file  . Highest education level: Not on file  Occupational History  . Occupation: Retired    Fish farm manager: RETIRED  Social Needs  . Financial resource strain: Not on file  . Food insecurity    Worry: Not on file    Inability: Not on file  . Transportation needs    Medical: Not on file    Non-medical: Not on file  Tobacco Use  . Smoking status: Never Smoker  . Smokeless tobacco: Never Used  . Tobacco comment: Never smoked  Substance and Sexual Activity  . Alcohol use: No    Alcohol/week: 0.0 standard drinks  . Drug use: No  . Sexual activity: Not Currently    Birth control/protection: Surgical  Lifestyle  . Physical activity    Days per week: Not on file    Minutes per session: Not on file  . Stress: Not on file  Relationships  . Social Herbalist on phone: Not on file    Gets together: Not on file    Attends religious service: Not on file    Active member of club or organization: Not on file    Attends meetings of clubs or organizations: Not on file    Relationship status: Not on file  Other Topics Concern  . Not on file  Social History Narrative  . Not on file    Review of Systems: Gen: see HPI  CV: Denies chest pain, palpitations, syncope, peripheral edema, and claudication. Resp: Denies dyspnea at rest, cough, wheezing, coughing up blood, and pleurisy. GI: see HPI Derm: Denies rash, itching, dry skin Psych: Denies depression, anxiety, memory loss, confusion. No homicidal or suicidal ideation.  Heme: Denies bruising, bleeding, and enlarged lymph nodes.  Physical Exam: BP (!) 148/87   Pulse 68   Temp (!) 96.6 F  (35.9 C) (Temporal)   Ht 5' (1.524 m)   Wt 115 lb 3.2 oz (52.3 kg)   BMI 22.50 kg/m  General:   Alert and oriented. No distress noted. Pleasant and cooperative.  Head:  Normocephalic and atraumatic. Abdomen:  +BS, soft, non-tender and non-distended. No rebound or guarding. No HSM or masses noted. Msk:  Symmetrical without gross deformities. Normal posture. Extremities:  Without edema. Neurologic:  Alert and  oriented x4 Psych:  Alert and cooperative. Normal mood and affect.  ASSESSMENT: Wanda [name] is an 85 y.o. female presenting today with a history of IDA, EGD on file, declining colonoscopy, followed by Hematology and doing well. No overt GI bleeding. Continues with Protonix once daily. Rare regurgitation of food but no solid food dysphagia: query multifactorial in setting of large hiatal hernia, possible motility disorder. She states this  is rare and does not want further evaluation at the moment.    PLAN:  Continue follow-up with Hematology Continue Protonix once daily: refills provided Monitor for overt GI bleeding Return in 6-8 months or sooner if needed   Annitta Needs, PhD, ANP-BC [facility]

## 2019-10-03 ENCOUNTER — Other Ambulatory Visit: Payer: Self-pay

## 2019-10-03 ENCOUNTER — Encounter: Payer: Self-pay | Admitting: Gastroenterology

## 2019-10-03 ENCOUNTER — Ambulatory Visit (INDEPENDENT_AMBULATORY_CARE_PROVIDER_SITE_OTHER): Payer: Medicare Other | Admitting: Gastroenterology

## 2019-10-03 VITALS — BP 148/87 | HR 68 | Temp 96.6°F | Ht 60.0 in | Wt 115.2 lb

## 2019-10-03 DIAGNOSIS — D509 Iron deficiency anemia, unspecified: Secondary | ICD-10-CM

## 2019-10-03 MED ORDER — PANTOPRAZOLE SODIUM 40 MG PO TBEC: 40.0000 mg | DELAYED_RELEASE_TABLET | Freq: Every day | ORAL | 11 refills | Status: DC

## 2019-10-03 NOTE — Patient Instructions (Signed)
Please call if any worsening reflux or regurgitating foods!  We will see you in 6-8 months!  Thank you SO MUCH for the beautiful cards!!!!   I enjoyed seeing you again today! As you know, I value our relationship and want to provide genuine, compassionate, and quality care. I welcome your feedback. If you receive a survey regarding your visit,  I greatly appreciate you taking time to fill this out. See you next time!  Annitta Needs, PhD, ANP-BC [facility]

## 2019-10-04 NOTE — Progress Notes (Signed)
cc'ed to pcp °

## 2019-10-05 ENCOUNTER — Ambulatory Visit (HOSPITAL_COMMUNITY): Payer: Medicare Other

## 2019-10-09 ENCOUNTER — Other Ambulatory Visit: Payer: Self-pay

## 2019-10-09 ENCOUNTER — Emergency Department (HOSPITAL_COMMUNITY)
Admission: EM | Admit: 2019-10-09 | Discharge: 2019-10-09 | Disposition: A | Discharge status: Home / Self Care | Payer: Medicare Other | Attending: Emergency Medicine | Admitting: Emergency Medicine

## 2019-10-09 ENCOUNTER — Emergency Department (HOSPITAL_COMMUNITY): Payer: Medicare Other

## 2019-10-09 ENCOUNTER — Encounter (HOSPITAL_COMMUNITY): Payer: Self-pay

## 2019-10-09 DIAGNOSIS — I10 Essential (primary) hypertension: Secondary | ICD-10-CM | POA: Diagnosis not present

## 2019-10-09 DIAGNOSIS — Z79899 Other long term (current) drug therapy: Secondary | ICD-10-CM | POA: Diagnosis not present

## 2019-10-09 DIAGNOSIS — L03115 Cellulitis of right lower limb: Secondary | ICD-10-CM | POA: Diagnosis not present

## 2019-10-09 DIAGNOSIS — L03119 Cellulitis of unspecified part of limb: Secondary | ICD-10-CM

## 2019-10-09 DIAGNOSIS — R2241 Localized swelling, mass and lump, right lower limb: Secondary | ICD-10-CM | POA: Diagnosis present

## 2019-10-09 LAB — BASIC METABOLIC PANEL
Anion gap: 13 (ref 5–15)
BUN: 16 mg/dL (ref 8–23)
CO2: 24 mmol/L (ref 22–32)
Calcium: 9.3 mg/dL (ref 8.9–10.3)
Chloride: 100 mmol/L (ref 98–111)
Creatinine, Ser: 0.53 mg/dL (ref 0.44–1.00)
GFR calc Af Amer: >60 mL/min (ref >60)
GFR calc non Af Amer: >60 mL/min (ref >60)
Glucose, Bld: 108 mg/dL — ABNORMAL HIGH (ref 70–99)
Potassium: 3.9 mmol/L (ref 3.5–5.1)
Sodium: 137 mmol/L (ref 135–145)

## 2019-10-09 LAB — CBC WITH DIFFERENTIAL/PLATELET
Abs Immature Granulocytes: 0.04 10*3/uL (ref 0.00–0.07)
Basophils Absolute: 0 10*3/uL (ref 0.0–0.1)
Basophils Relative: 0 %
Eosinophils Absolute: 0 10*3/uL (ref 0.0–0.5)
Eosinophils Relative: 0 %
HCT: 36.6 % (ref 36.0–46.0)
Hemoglobin: 11.9 g/dL — ABNORMAL LOW (ref 12.0–15.0)
Immature Granulocytes: 0 %
Lymphocytes Relative: 11 %
Lymphs Abs: 1.4 10*3/uL (ref 0.7–4.0)
MCH: 32.2 pg (ref 26.0–34.0)
MCHC: 32.5 g/dL (ref 30.0–36.0)
MCV: 98.9 fL (ref 80.0–100.0)
Monocytes Absolute: 1.2 10*3/uL — ABNORMAL HIGH (ref 0.1–1.0)
Monocytes Relative: 9 %
Neutro Abs: 10 10*3/uL — ABNORMAL HIGH (ref 1.7–7.7)
Neutrophils Relative %: 80 %
Platelets: 293 10*3/uL (ref 150–400)
RBC: 3.7 MIL/uL — ABNORMAL LOW (ref 3.87–5.11)
RDW: 12.6 % (ref 11.5–15.5)
WBC: 12.6 10*3/uL — ABNORMAL HIGH (ref 4.0–10.5)
nRBC: 0 % (ref 0.0–0.2)

## 2019-10-09 MED ORDER — VANCOMYCIN HCL IN DEXTROSE 1-5 GM/200ML-% IV SOLN
1000.0000 mg | Freq: Once | INTRAVENOUS | Status: AC
  Administered 2019-10-09: 1000 mg via INTRAVENOUS
  Filled 2019-10-09: qty 200

## 2019-10-09 MED ORDER — HYDROCODONE-ACETAMINOPHEN 5-325 MG PO TABS: 1.0000 | ORAL_TABLET | Freq: Four times a day (QID) | ORAL | 0 refills | Status: DC | PRN

## 2019-10-09 MED ORDER — OXYCODONE-ACETAMINOPHEN 5-325 MG PO TABS
1.0000 | ORAL_TABLET | Freq: Once | ORAL | Status: AC
  Administered 2019-10-09: 1 via ORAL
  Filled 2019-10-09: qty 1

## 2019-10-09 MED ORDER — DOXYCYCLINE HYCLATE 100 MG PO CAPS: 100.0000 mg | ORAL_CAPSULE | Freq: Two times a day (BID) | ORAL | 0 refills | Status: AC

## 2019-10-09 NOTE — Discharge Instructions (Signed)
Take antibiotics as directed. Please take all of your antibiotics until finished.  Take pain medications as directed for break through pain. Do not drive or operate machinery while taking this medication.   Follow-up with your primary care doctor in the next 3 to 4 days for further evaluation.  If redness starts spreading outside of the mark, pain gets worse, redness or swelling gets worse, you have fevers or any other worsening or concerning symptoms, return the emergency department.

## 2019-10-09 NOTE — ED Triage Notes (Signed)
Pt woke up Saturday with right foot swelling. Denies any injury to foot. States it is warm and red. Is unable to walk on it today. Denies any fevers. NAD.

## 2019-10-09 NOTE — ED Provider Notes (Signed)
[facility] EMERGENCY DEPARTMENT Provider Note   CSN: [id number] Arrival date & time: 10/09/19  0957     History   Chief Complaint Chief Complaint  Patient presents with   Foot Swelling    HPI Wanda [name] is a 85 y.o. female past medical history of anemia, diverticulosis, GERD, hypertension who presents for evaluation of 3 days of pain, redness, swelling noted to her right ankle.  She does not recall any preceding trauma, injury, fall.  She thinks that she did not have any bites, scratches or injuries.  She reports that since then, the swelling and pain has gotten progressively worse.  She states that it hurts when she walks on it.  She has not taken any medication for the pain.  She does not have any history of gout or diabetes.  She has not noted any fevers, numbness/weakness, nausea/vomiting.      The history is provided by the patient.    Past Medical History:  Diagnosis Date   Anemia    Cystocele    Diverticulosis 10/20/10   Left-sided on colonoscopy by Dr. Gala Romney 10/20/10   Gastric polyps 10/02/2013   Gastritis 12/08/10   EGD by Dr. 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 Aline Brochure currently   Sleep apnea    STOP BANG SCORE 4   Urge incontinence    Vitamin D deficiency     Patient Active Problem List   Diagnosis Date Noted   Loss of weight 06/28/2019   Sleep apnea 11/20/2018   Hypokalemia 11/20/2018   Leukocytosis 11/20/2018   Symptomatic anemia 11/04/2018   Diarrhea, functional 03/15/2015   Fatigue 12/28/2014   Upper abdominal pain 12/28/2014   B12 deficiency 12/28/2014    Class: History of   Acute blood loss anemia 10/02/2013   GI bleed  10/02/2013   Melena 10/02/2013   Hematemesis 10/02/2013   Sinus tachycardia 10/02/2013   Multiple gastric erosions 10/02/2013   Hiatal hernia 10/02/2013   Gastric polyps 10/02/2013   HTN (hypertension)    IDA (iron deficiency anemia)    Adhesive capsulitis of right shoulder 03/01/2012   Iron deficiency anemia 02/02/2012   Distal radius fracture 11/30/2011   Diverticulosis 10/20/2010   Normocytic anemia 10/01/2010   GERD 10/01/2010   FECAL OCCULT BLOOD 10/01/2010    Past Surgical History:  Procedure Laterality Date   ABDOMINAL HYSTERECTOMY     ANTERIOR AND POSTERIOR REPAIR  05/17/2012   Procedure: ANTERIOR (CYSTOCELE) AND POSTERIOR REPAIR (RECTOCELE);  Surgeon: Jonnie Kind, MD;  Location: [facility];  Service: Gynecology;  Laterality: N/A;   BIOPSY  11/22/2018   Procedure: BIOPSY;  Surgeon: Daneil Dolin, MD;  Location: [facility] END[facility] SUITE;  Service: Endoscopy;;   COLONOSCOPY  02/29/2012   Dr. Gala Romney: colonic diverticulosis, minimal internal hemorrhoids, benign polyp   ESOPHAGOGASTRODUODENOSCOPY  02/29/2012   Dr. Gala Romney: atonic esophagus, moderate-sized hiatal hernia, fundal gland polyps   ESOPHAGOGASTRODUODENOSCOPY N/A 10/02/2013   Dr. Gala Romney- multiple Wilhemena Durie gastric polyps- not manipulated   ESOPHAGOGASTRODUODENOSCOPY N/A 01/14/2015   Dr. Gala Romney: Large hiatal hernia, otherwise normal EGD   ESOPHAGOGASTRODUODENOSCOPY (EGD) WITH PROPOFOL N/A 11/22/2018   Dr. Gala Romney: normal esophagus, large hiatal hernia, erosive gastropathy s/p biopsy,  normal duodenum. Reactive gastropathy   GIVENS CAPSULE STUDY  02/29/2012   No explanation for IDA. Possible extrinsic compression but negative CT.    SALPINGOOPHORECTOMY  05/17/2012   Procedure: SALPINGO OOPHERECTOMY;  Surgeon: Jonnie Kind, MD;  Location: [facility];  Service: Gynecology;  Laterality: Bilateral;   VAGINAL HYSTERECTOMY  05/17/2012   Procedure: HYSTERECTOMY VAGINAL;  Surgeon: Jonnie Kind, MD;  Location: [facility]  ORS;  Service: Gynecology;  Laterality: N/A;     OB History   No obstetric history on file.      Home Medications    Prior to Admission medications   Medication Sig Start Date End Date Taking? Authorizing Provider  CALCIUM PO Take 300-600 mg by mouth daily. Sometimes doesn't take everyday   Yes [provider]  cyanocobalamin (,VITAMIN B-12,) 1000 MCG/ML injection Inject 1 mL into the muscle every 30 (thirty) days. 09/06/19  Yes [provider]  ferrous sulfate 325 (65 FE) MG tablet Take 1 tablet (325 mg total) by mouth daily with breakfast. 11/05/18 10/09/19 Yes Shah, Pratik D, DO  lisinopril-hydrochlorothiazide (PRINZIDE,ZESTORETIC) 20-12.5 MG per tablet Take 1 tablet by mouth daily.   Yes [provider]  Multiple Vitamin (MULTIVITAMIN) tablet Take 1 tablet by mouth. Sometimes doesn't take everyday   Yes [provider]  pantoprazole (PROTONIX) 40 MG tablet Take 1 tablet (40 mg total) by mouth daily. 10/03/19  Yes Annitta Needs, NP  doxycycline (VIBRAMYCIN) 100 MG capsule Take 1 capsule (100 mg total) by mouth 2 (two) times daily for 7 days. 10/09/19 10/16/19  Volanda Napoleon, PA-C  HYDROcodone-acetaminophen (NORCO/VICODIN) 5-325 MG tablet Take 1-2 tablets by mouth every 6 (six) hours as needed. 10/09/19   Volanda Napoleon, PA-C    Family History Family History  Problem Relation Age of Onset   Hypertension Mother    Lymphoma Brother    Anesthesia problems Neg Hx    Hypotension Neg Hx    Malignant hyperthermia Neg Hx    Pseudochol deficiency Neg Hx    Colon cancer Neg Hx     Social History Social History   Tobacco Use   Smoking status: Never Smoker   Smokeless tobacco: Never Used   Tobacco comment: Never smoked  Substance Use Topics   Alcohol use: No    Alcohol/week: 0.0 standard drinks   Drug use: No     Allergies   Sulfa antibiotics and Naproxen sodium   Review of Systems Review of Systems  Constitutional:  Negative for fever.  Musculoskeletal:       Ankle pain  Skin: Positive for color change.  Neurological: Negative for weakness.  All other systems reviewed and are negative.    Physical Exam Updated Vital Signs BP (!) 153/71    Pulse 92    Temp 98.3 F (36.8 C) (Oral)    Resp 18    Ht 5' (1.524 m)    Wt 52.2 kg    SpO2 100%    BMI 22.46 kg/m   Physical Exam Vitals signs and nursing note reviewed.  Constitutional:      Appearance: She is well-developed.  HENT:     Head: Normocephalic and atraumatic.  Eyes:     General: No scleral icterus.       Right eye: No discharge.        Left eye: No discharge.     Conjunctiva/sclera: Conjunctivae normal.  Cardiovascular:     Pulses:          Dorsalis pedis  pulses are 2+ on the right side and 2+ on the left side.  Pulmonary:     Effort: Pulmonary effort is normal.  Musculoskeletal:     Comments: Tenderness palpation in the medial aspect of the right ankle with overlying soft tissue swelling, warmth, erythema that is about 4 x 5 cm in size.  This is isolated to the medial malleolus of the ankle.  It does not extend over the joint line.  Dorsiflexion and plantar flexion intact.  No tenderness palpation of distal tib-fib, proximal tib-fib.  Skin:    General: Skin is warm and dry.     Capillary Refill: Capillary refill takes less than 2 seconds.     Comments: Good distal cap refill.  RLE is not dusky in appearance or cool to touch.   Neurological:     Mental Status: She is alert.     Comments: Sensation intact along major nerve distributions of BLE  Psychiatric:        Speech: Speech normal.        Behavior: Behavior normal.      ED Treatments / Results  Labs (all labs ordered are listed, but only abnormal results are displayed) Labs Reviewed  BASIC METABOLIC PANEL - Abnormal; Notable for the following components:      Result Value   Glucose, Bld 108 (*)    All other components within normal limits  CBC WITH  DIFFERENTIAL/PLATELET - Abnormal; Notable for the following components:   WBC 12.6 (*)    RBC 3.70 (*)    Hemoglobin 11.9 (*)    Neutro Abs 10.0 (*)    Monocytes Absolute 1.2 (*)    All other components within normal limits    EKG None  Radiology Dg Foot Complete Right  Result Date: 10/09/2019 CLINICAL DATA:  85 year old female with redness and pain and swelling of the right foot. EXAM: RIGHT FOOT COMPLETE - 3+ VIEW COMPARISON:  None. FINDINGS: There is no acute fracture or dislocation. The bones are osteopenic. There is diffuse subcutaneous edema. No radiopaque foreign object or soft tissue gas. IMPRESSION: 1. No acute fracture or dislocation. 2. Diffuse subcutaneous edema. Clinical correlation is recommended to evaluate for cellulitis. Electronically Signed   By: Anner Crete M.D.   On: 10/09/2019 11:04    Procedures Procedures (including critical care time)  Medications Ordered in ED Medications  vancomycin (VANCOCIN) IVPB 1000 mg/200 mL premix (0 mg Intravenous Stopped 10/09/19 1644)  oxyCODONE-acetaminophen (PERCOCET/ROXICET) 5-325 MG per tablet 1 tablet (1 tablet Oral Given 10/09/19 1650)     Initial Impression / Assessment and Plan / ED Course  I have reviewed the triage vital signs and the nursing notes.  Pertinent labs & imaging results that were available during my care of the patient were reviewed by me and considered in my medical decision making (see chart for details).        85 year old female who presents for evaluation of pain, mass, wart to foot x3 days.  No preceding trauma, injury.  No fevers, numbness/weakness.  Honestly arrival, she is afebrile, nontoxic-appearing.  Vitals are stable.  Concern for cellulitis.  Do not suspect septic arthritis, gout at this point.  The erythema and edema is not circumferential and she can still have some movement of the ankle joint.  history/physical exam not concerning for ischemic leg, DVT of right lower extremity.   Plan for labs, x-ray.  Will give dose of IV antibiotics here in the ED.  CBC shows leukocytosis of 12.6.  BUN and creatinine is 16/0.53.  X-ray shows soft tissue swelling but no acute bony abnormality.  Discussed results with patient.  At this time, patient has no systemic symptoms and the redness and warmth is isolated to the medial aspect of the ankle.  Feel that this is representative cellulitis.  Do not feel that this is septic arthritis.  I did discuss patient's treatment course with patient and she would prefer to go home with p.o. antibiotics.  Given overall well appearance, I feel that this is reasonable.  Discussed with Dr. Lacinda Axon who agrees with plan.  Patient given a dose of vancomycin here in the ED.  We will plan to send her home with doxycycline.  Skin marker used to demarcate the lines and patient given strict instructions to return if redness and swelling worsens. At this time, patient exhibits no emergent life-threatening condition that require further evaluation in ED or admission. Patient had ample opportunity for questions and discussion. All patient's questions were answered with full understanding. Strict return precautions discussed. Patient expresses understanding and agreement to plan.   Portions of this note were generated with Lobbyist. Dictation errors may occur despite best attempts at proofreading.   Final Clinical Impressions(s) / ED Diagnoses   Final diagnoses:  Cellulitis of foot    ED Discharge Orders         Ordered    doxycycline (VIBRAMYCIN) 100 MG capsule  2 times daily     10/09/19 1538    HYDROcodone-acetaminophen (NORCO/VICODIN) 5-325 MG tablet  Every 6 hours PRN     10/09/19 1538    Wheelchair     10/09/19 1707           Desma Mcgregor 10/09/19 2106    Nat Christen, MD 10/10/19 [phone]

## 2019-11-03 ENCOUNTER — Ambulatory Visit (HOSPITAL_COMMUNITY): Payer: Medicare Other

## 2019-12-04 ENCOUNTER — Ambulatory Visit (HOSPITAL_COMMUNITY): Payer: Medicare Other

## 2019-12-11 ENCOUNTER — Telehealth: Payer: Self-pay | Admitting: Internal Medicine

## 2019-12-11 DIAGNOSIS — K921 Melena: Secondary | ICD-10-CM

## 2019-12-11 DIAGNOSIS — D509 Iron deficiency anemia, unspecified: Secondary | ICD-10-CM

## 2019-12-11 DIAGNOSIS — K922 Gastrointestinal hemorrhage, unspecified: Secondary | ICD-10-CM

## 2019-12-11 NOTE — Telephone Encounter (Signed)
Message forwarded to refill box.

## 2019-12-11 NOTE — Telephone Encounter (Signed)
Pt is requesting a refill on iron 325 mg daily sent to her pharmacy.

## 2019-12-11 NOTE — Telephone Encounter (Signed)
Pt needs a refill on her iron pills. She uses [facility]

## 2019-12-12 MED ORDER — FERROUS SULFATE 325 (65 FE) MG PO TABS: 325.0000 mg | ORAL_TABLET | Freq: Every day | ORAL | 3 refills | Status: DC

## 2019-12-12 NOTE — Addendum Note (Signed)
Addended by: Gordy Levan, Ayda Tancredi A on: 12/12/2019 09:01 AM   Modules accepted: Orders

## 2019-12-12 NOTE — Telephone Encounter (Signed)
Rx sent per patient request  

## 2019-12-12 NOTE — Telephone Encounter (Signed)
Spoke with pt. Pt notified that RX was sent into her pharmacy.

## 2020-01-01 ENCOUNTER — Other Ambulatory Visit (HOSPITAL_COMMUNITY): Payer: Self-pay | Admitting: *Deleted

## 2020-01-01 DIAGNOSIS — E538 Deficiency of other specified B group vitamins: Secondary | ICD-10-CM

## 2020-01-01 DIAGNOSIS — D500 Iron deficiency anemia secondary to blood loss (chronic): Secondary | ICD-10-CM

## 2020-01-01 DIAGNOSIS — D508 Other iron deficiency anemias: Secondary | ICD-10-CM

## 2020-01-02 ENCOUNTER — Inpatient Hospital Stay (HOSPITAL_COMMUNITY): Payer: Medicare Other | Attending: Hematology

## 2020-01-02 ENCOUNTER — Other Ambulatory Visit: Payer: Self-pay

## 2020-01-02 DIAGNOSIS — R5383 Other fatigue: Secondary | ICD-10-CM | POA: Diagnosis not present

## 2020-01-02 DIAGNOSIS — D508 Other iron deficiency anemias: Secondary | ICD-10-CM

## 2020-01-02 DIAGNOSIS — E538 Deficiency of other specified B group vitamins: Secondary | ICD-10-CM | POA: Diagnosis not present

## 2020-01-02 DIAGNOSIS — K449 Diaphragmatic hernia without obstruction or gangrene: Secondary | ICD-10-CM | POA: Diagnosis not present

## 2020-01-02 DIAGNOSIS — D509 Iron deficiency anemia, unspecified: Secondary | ICD-10-CM | POA: Diagnosis present

## 2020-01-02 DIAGNOSIS — D500 Iron deficiency anemia secondary to blood loss (chronic): Secondary | ICD-10-CM

## 2020-01-02 LAB — COMPREHENSIVE METABOLIC PANEL
ALT: 17 U/L (ref 0–44)
AST: 24 U/L (ref 15–41)
Albumin: 4.3 g/dL (ref 3.5–5.0)
Alkaline Phosphatase: 97 U/L (ref 38–126)
Anion gap: 10 (ref 5–15)
BUN: 16 mg/dL (ref 8–23)
CO2: 28 mmol/L (ref 22–32)
Calcium: 9.3 mg/dL (ref 8.9–10.3)
Chloride: 102 mmol/L (ref 98–111)
Creatinine, Ser: 0.64 mg/dL (ref 0.44–1.00)
GFR calc Af Amer: >60 mL/min (ref >60)
GFR calc non Af Amer: >60 mL/min (ref >60)
Glucose, Bld: 93 mg/dL (ref 70–99)
Potassium: 4.1 mmol/L (ref 3.5–5.1)
Sodium: 140 mmol/L (ref 135–145)
Total Bilirubin: 1.5 mg/dL — ABNORMAL HIGH (ref 0.3–1.2)
Total Protein: 7.5 g/dL (ref 6.5–8.1)

## 2020-01-02 LAB — CBC WITH DIFFERENTIAL/PLATELET
Abs Immature Granulocytes: 0.02 10*3/uL (ref 0.00–0.07)
Basophils Absolute: 0.1 10*3/uL (ref 0.0–0.1)
Basophils Relative: 1 %
Eosinophils Absolute: 0.2 10*3/uL (ref 0.0–0.5)
Eosinophils Relative: 2 %
HCT: 36.6 % (ref 36.0–46.0)
Hemoglobin: 11.6 g/dL — ABNORMAL LOW (ref 12.0–15.0)
Immature Granulocytes: 0 %
Lymphocytes Relative: 24 %
Lymphs Abs: 2 10*3/uL (ref 0.7–4.0)
MCH: 31.4 pg (ref 26.0–34.0)
MCHC: 31.7 g/dL (ref 30.0–36.0)
MCV: 99.2 fL (ref 80.0–100.0)
Monocytes Absolute: 0.5 10*3/uL (ref 0.1–1.0)
Monocytes Relative: 6 %
Neutro Abs: 5.5 10*3/uL (ref 1.7–7.7)
Neutrophils Relative %: 67 %
Platelets: 344 10*3/uL (ref 150–400)
RBC: 3.69 MIL/uL — ABNORMAL LOW (ref 3.87–5.11)
RDW: 12.7 % (ref 11.5–15.5)
WBC: 8.3 10*3/uL (ref 4.0–10.5)
nRBC: 0 % (ref 0.0–0.2)

## 2020-01-02 LAB — FERRITIN: Ferritin: 60 ng/mL (ref 11–307)

## 2020-01-02 LAB — LACTATE DEHYDROGENASE: LDH: 157 U/L (ref 98–192)

## 2020-01-02 LAB — VITAMIN B12: Vitamin B-12: 461 pg/mL (ref 180–914)

## 2020-01-07 LAB — METHYLMALONIC ACID, SERUM: Methylmalonic Acid, Quantitative: 142 nmol/L (ref 0–378)

## 2020-01-09 ENCOUNTER — Ambulatory Visit (HOSPITAL_COMMUNITY): Payer: Medicare Other

## 2020-01-09 ENCOUNTER — Inpatient Hospital Stay (HOSPITAL_BASED_OUTPATIENT_CLINIC_OR_DEPARTMENT_OTHER): Payer: Medicare Other | Admitting: Nurse Practitioner

## 2020-01-09 ENCOUNTER — Other Ambulatory Visit: Payer: Self-pay

## 2020-01-09 DIAGNOSIS — D509 Iron deficiency anemia, unspecified: Secondary | ICD-10-CM | POA: Diagnosis not present

## 2020-01-09 NOTE — Progress Notes (Signed)
Wanda [name], [place] [address]   CLINIC:  Medical Oncology/Hematology  PCP:  Sander Radon, NP [place] [address] [phone]   REASON FOR VISIT: Follow-up for iron deficiency anemia  CURRENT THERAPY: Intermittent iron infusions   INTERVAL HISTORY:  Wanda [name] 86 y.o. female was called for a telephone visit for her iron deficiency anemia.  Patient reports she has been more fatigue lately.  She denies any bright red bleeding per rectum or melena.  She denies any other obvious bleeding.  Patient is continuing to take iron tablets and B12 injections.  Patient reports she does not feel that they are helping she is so fatigued still. Denies any nausea, vomiting, or diarrhea. Denies any new pains. Had not noticed any recent bleeding such as epistaxis, hematuria or hematochezia. Denies recent chest pain on exertion, shortness of breath on minimal exertion, pre-syncopal episodes, or palpitations. Denies any numbness or tingling in hands or feet. Denies any recent fevers, infections, or recent hospitalizations. Patient reports appetite at 100% and energy level at 25%.  Patient is eating well maintaining her weight at this time.    REVIEW OF SYSTEMS:  Review of Systems  Constitutional: Positive for fatigue.  All other systems reviewed and are negative.    PAST MEDICAL/SURGICAL HISTORY:  Past Medical History:  Diagnosis Date  . Anemia   . Cystocele   . Diverticulosis 10/20/10   Left-sided on colonoscopy by Dr. Gala Romney 10/20/10  . Gastric polyps 10/02/2013  . Gastritis 12/08/10   EGD by Dr. Altha Harm hiatal hernia, duodenal diverticulum, chronic gastritis  . GERD (gastroesophageal reflux disease)   . Heart murmur   . Hiatal hernia 10/02/2013  . HTN (hypertension)   . IDA (iron deficiency anemia)   . 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 Osteoarthritis   . Radial fracture    (right) Undergoing treatment by Dr. Aline Brochure  currently  . Sleep apnea    STOP BANG SCORE 4  . Urge incontinence   . Vitamin D deficiency    Past Surgical History:  Procedure Laterality Date  . ABDOMINAL HYSTERECTOMY    . ANTERIOR AND POSTERIOR REPAIR  05/17/2012   Procedure: ANTERIOR (CYSTOCELE) AND POSTERIOR REPAIR (RECTOCELE);  Surgeon: Jonnie Kind, MD;  Location: [facility];  Service: Gynecology;  Laterality: N/A;  . BIOPSY  11/22/2018   Procedure: BIOPSY;  Surgeon: Daneil Dolin, MD;  Location: [facility];  Service: Endoscopy;;  . COLONOSCOPY  02/29/2012   Dr. Gala Romney: colonic diverticulosis, minimal internal hemorrhoids, benign polyp  . ESOPHAGOGASTRODUODENOSCOPY  02/29/2012   Dr. Gala Romney: atonic esophagus, moderate-sized hiatal hernia, fundal gland polyps  . ESOPHAGOGASTRODUODENOSCOPY N/A 10/02/2013   Dr. Gala Romney- multiple Wilhemena Durie gastric polyps- not manipulated  . ESOPHAGOGASTRODUODENOSCOPY N/A 01/14/2015   Dr. Gala Romney: Large hiatal hernia, otherwise normal EGD  . ESOPHAGOGASTRODUODENOSCOPY (EGD) WITH PROPOFOL N/A 11/22/2018   Dr. Gala Romney: normal esophagus, large hiatal hernia, erosive gastropathy s/p biopsy, normal duodenum. Reactive gastropathy  . GIVENS CAPSULE STUDY  02/29/2012   No explanation for IDA. Possible extrinsic compression but negative CT.   Marland Kitchen SALPINGOOPHORECTOMY  05/17/2012   Procedure: SALPINGO OOPHERECTOMY;  Surgeon: Jonnie Kind, MD;  Location: [facility];  Service: Gynecology;  Laterality: Bilateral;  . VAGINAL HYSTERECTOMY  05/17/2012   Procedure: HYSTERECTOMY VAGINAL;  Surgeon: Jonnie Kind, MD;  Location: [facility];  Service: Gynecology;  Laterality: N/A;     SOCIAL HISTORY:  Social History   Socioeconomic History  .  Marital status: Widowed    Spouse name: Not on file  . Number of children: Not on file  . Years of education: Not on file  . Highest education level: Not on file  Occupational History  . Occupation: Retired    Fish farm manager: RETIRED  Tobacco Use  . Smoking status: Never Smoker  .  Smokeless tobacco: Never Used  . Tobacco comment: Never smoked  Substance and Sexual Activity  . Alcohol use: No    Alcohol/week: 0.0 standard drinks  . Drug use: No  . Sexual activity: Not Currently    Birth control/protection: Surgical  Other Topics Concern  . Not on file  Social History Narrative  . Not on file   Social Determinants of Health   Financial Resource Strain:   . Difficulty of Paying Living Expenses: Not on file  Food Insecurity:   . Worried About Charity fundraiser in the Last Year: Not on file  . Ran Out of Food in the Last Year: Not on file  Transportation Needs:   . Lack of Transportation (Medical): Not on file  . Lack of Transportation (Non-Medical): Not on file  Physical Activity:   . Days of Exercise per Week: Not on file  . Minutes of Exercise per Session: Not on file  Stress:   . Feeling of Stress : Not on file  Social Connections:   . Frequency of Communication with Friends and Family: Not on file  . Frequency of Social Gatherings with Friends and Family: Not on file  . Attends Religious Services: Not on file  . Active Member of Clubs or Organizations: Not on file  . Attends Archivist Meetings: Not on file  . Marital Status: Not on file  Intimate Partner Violence:   . Fear of Current or Ex-Partner: Not on file  . Emotionally Abused: Not on file  . Physically Abused: Not on file  . Sexually Abused: Not on file    FAMILY HISTORY:  Family History  Problem Relation Age of Onset  . Hypertension Mother   . Lymphoma Brother   . Anesthesia problems Neg Hx   . Hypotension Neg Hx   . Malignant hyperthermia Neg Hx   . Pseudochol deficiency Neg Hx   . Colon cancer Neg Hx     CURRENT MEDICATIONS:  Outpatient Encounter Medications as of 01/09/2020  Medication Sig  . CALCIUM PO Take 300-600 mg by mouth daily. Sometimes doesn't take everyday  . cyanocobalamin (,VITAMIN B-12,) 1000 MCG/ML injection Inject 1 mL into the muscle every 30  (thirty) days.  . ferrous sulfate 325 (65 FE) MG tablet Take 1 tablet (325 mg total) by mouth daily with breakfast.  . HYDROcodone-acetaminophen (NORCO/VICODIN) 5-325 MG tablet Take 1-2 tablets by mouth every 6 (six) hours as needed.  Marland Kitchen lisinopril-hydrochlorothiazide (PRINZIDE,ZESTORETIC) 20-12.5 MG per tablet Take 1 tablet by mouth daily.  . Multiple Vitamin (MULTIVITAMIN) tablet Take 1 tablet by mouth. Sometimes doesn't take everyday  . pantoprazole (PROTONIX) 40 MG tablet Take 1 tablet (40 mg total) by mouth daily.   No facility-administered encounter medications on file as of 01/09/2020.    ALLERGIES:  Allergies  Allergen Reactions  . Sulfa Antibiotics     unknown  . Naproxen Sodium Rash and Other (See Comments)    Passed out     Vital signs: -Deferred due to telephone visit  Physical Exam -Deferred due to telephone visit -Patient was alert and oriented over the phone and in no acute distress.  LABORATORY  DATA:  I have reviewed the labs as listed.  CBC    Component Value Date/Time   WBC 8.3 01/02/2020 1251   RBC 3.69 (L) 01/02/2020 1251   HGB 11.6 (L) 01/02/2020 1251   HGB 12.4 02/21/2014 1328   HCT 36.6 01/02/2020 1251   HCT 37.2 02/21/2014 1328   PLT 344 01/02/2020 1251   PLT 273 02/21/2014 1328   MCV 99.2 01/02/2020 1251   MCV 93 02/21/2014 1328   MCH 31.4 01/02/2020 1251   MCHC 31.7 01/02/2020 1251   RDW 12.7 01/02/2020 1251   RDW 15.0 (H) 02/21/2014 1328   LYMPHSABS 2.0 01/02/2020 1251   LYMPHSABS 2.1 02/21/2014 1328   MONOABS 0.5 01/02/2020 1251   MONOABS 0.5 02/21/2014 1328   EOSABS 0.2 01/02/2020 1251   EOSABS 0.2 02/21/2014 1328   BASOSABS 0.1 01/02/2020 1251   BASOSABS 0.1 02/21/2014 1328   CMP Latest Ref Rng & Units 01/02/2020 10/09/2019 06/26/2019  Glucose 70 - 99 mg/dL 93 108(H) 93  BUN 8 - 23 mg/dL 16 16 19   Creatinine 0.44 - 1.00 mg/dL 0.64 0.53 0.69  Sodium 135 - 145 mmol/L 140 137 138  Potassium 3.5 - 5.1 mmol/L 4.1 3.9 4.2  Chloride 98 -  111 mmol/L 102 100 102  CO2 22 - 32 mmol/L 28 24 26   Calcium 8.9 - 10.3 mg/dL 9.3 9.3 9.5  Total Protein 6.5 - 8.1 g/dL 7.5 - 7.1  Total Bilirubin 0.3 - 1.2 mg/dL 1.5(H) - 1.6(H)  Alkaline Phos 38 - 126 U/L 97 - 81  AST 15 - 41 U/L 24 - 23  ALT 0 - 44 U/L 17 - 15    All questions were answered to patient's stated satisfaction. Encouraged patient to call with any new concerns or questions before his next visit to the cancer center and we can certain see him sooner, if needed.      ASSESSMENT & PLAN:   Iron deficiency anemia 1.  Iron deficiency anemia: -Developed abdominal pain and was seen in the ER where they did labs on 11/04/2018 which showed hemoglobin 5.9 and ferritin 7.  Patient was transfused with 2 units of PRBCs with improvement.  He also received IV Feraheme during admission as well. -Patient had EGD in 2016 by Dr. Gala Romney with a large hiatal hernia, otherwise normal.  Previously noted multiple [name] lesions in 2014.  Capsule study in 2013 without explanation for IDA. -Last colonoscopy in 2013 with colonic diverticulosis. -Patient was hospitalized from 11/20/2018 to 11/22/2018 for anemia. -EGD was repeated on 11/22/2018 that showed normal esophagus, large hiatal hernia, erosive gastropathy, normal duodenal bulb and second portion of the duodenum.  Anemia likely multifactorial in etiology. -She last received an iron infusion on 10/2018.  Patient was also started on vitamin B12 injections in 01/05/2019. -Labs done on 01/02/2020 showed hemoglobin 11.6, ferritin 60, B12 461. -Patient denies any obvious signs of bleeding. -Patient reports she is taking her B12 injections and she is also taking 1 iron tablet daily.  She reports they are not helping she is still very fatigued. -We will set her up with 2 infusions of IV iron. -We will see her back in 4 months with repeat labs.  2.  Vitamin B12 deficiency: -Patient was started on vitamin B12 1000 mg injections in February 2020. -Labs done on  01/02/2020 showed B12 had improved to 461. -She will continue on these injections at this time.      Orders placed this encounter:  Orders Placed This Encounter  Procedures  . Lactate dehydrogenase  . CBC with Differential/Platelet  . Comprehensive metabolic panel  . Ferritin  . Iron and TIBC  . Vitamin B12  . VITAMIN D 25 Hydroxy (Vit-D Deficiency, Fractures)  . Folate     I provided 19 minutes of non face-to-face telephone visit time during this encounter, and > 50% was spent counseling as documented under my assessment & plan.    Francene Finders, FNP-C [place] [phone]

## 2020-01-09 NOTE — Assessment & Plan Note (Addendum)
1.  Iron deficiency anemia: -Developed abdominal pain and was seen in the ER where they did labs on 11/04/2018 which showed hemoglobin 5.9 and ferritin 7.  Patient was transfused with 2 units of PRBCs with improvement.  He also received IV Feraheme during admission as well. -Patient had EGD in 2016 by Dr. Gala Romney with a large hiatal hernia, otherwise normal.  Previously noted multiple [name] lesions in 2014.  Capsule study in 2013 without explanation for IDA. -Last colonoscopy in 2013 with colonic diverticulosis. -Patient was hospitalized from 11/20/2018 to 11/22/2018 for anemia. -EGD was repeated on 11/22/2018 that showed normal esophagus, large hiatal hernia, erosive gastropathy, normal duodenal bulb and second portion of the duodenum.  Anemia likely multifactorial in etiology. -She last received an iron infusion on 10/2018.  Patient was also started on vitamin B12 injections in 01/05/2019. -Labs done on 01/02/2020 showed hemoglobin 11.6, ferritin 60, B12 461. -Patient denies any obvious signs of bleeding. -Patient reports she is taking her B12 injections and she is also taking 1 iron tablet daily.  She reports they are not helping she is still very fatigued. -We will set her up with 2 infusions of IV iron. -We will see her back in 4 months with repeat labs.  2.  Vitamin B12 deficiency: -Patient was started on vitamin B12 1000 mg injections in February 2020. -Labs done on 01/02/2020 showed B12 had improved to 461. -She will continue on these injections at this time.

## 2020-01-09 NOTE — Patient Instructions (Signed)
[facility] at [facility] Discharge Instructions  Follow up in 4 months with labs    Thank you for choosing [facility] at [facility] to provide your oncology and hematology care.  To afford each patient quality time with our provider, please arrive at least 15 minutes before your scheduled appointment time.   If you have a lab appointment with the Cancer Center please come in thru the Main Entrance and check in at the main information desk.  You need to re-schedule your appointment should you arrive 10 or more minutes late.  We strive to give you quality time with our providers, and arriving late affects you and other patients whose appointments are after yours.  Also, if you no show three or more times for appointments you may be dismissed from the clinic at the providers discretion.     Again, thank you for choosing [facility].  Our hope is that these requests will decrease the amount of time that you wait before being seen by our physicians.       _____________________________________________________________  Should you have questions after your visit to [facility], please contact our office at [phone] between the hours of 8:00 a.m. and 4:30 p.m.  Voicemails left after 4:00 p.m. will not be returned until the following business day.  For prescription refill requests, have your pharmacy contact our office and allow 72 hours.    Due to Covid, you will need to wear a mask upon entering the hospital. If you do not have a mask, a mask will be given to you at the Main Entrance upon arrival. For doctor visits, patients may have 1 support person with them. For treatment visits, patients can not have anyone with them due to social distancing guidelines and our immunocompromised population.      

## 2020-01-16 ENCOUNTER — Other Ambulatory Visit: Payer: Self-pay

## 2020-01-16 ENCOUNTER — Inpatient Hospital Stay (HOSPITAL_COMMUNITY): Payer: Medicare Other | Attending: Hematology

## 2020-01-16 VITALS — BP 104/57 | HR 65 | Temp 97.7°F | Resp 18

## 2020-01-16 DIAGNOSIS — D509 Iron deficiency anemia, unspecified: Secondary | ICD-10-CM | POA: Diagnosis not present

## 2020-01-16 DIAGNOSIS — E538 Deficiency of other specified B group vitamins: Secondary | ICD-10-CM

## 2020-01-16 MED ORDER — SODIUM CHLORIDE 0.9 % IV SOLN
510.0000 mg | Freq: Once | INTRAVENOUS | Status: AC
  Administered 2020-01-16: 510 mg via INTRAVENOUS
  Filled 2020-01-16: qty 17

## 2020-01-16 MED ORDER — SODIUM CHLORIDE 0.9 % IV SOLN: INTRAVENOUS | Status: DC

## 2020-01-16 NOTE — Progress Notes (Signed)
Wanda [name] presents today for IV iron infusion. Infusion tolerated without incident or complaint. See MAR for details. VSS prior to and post infusion. Discharged in satisfactory condition with follow up instructions.

## 2020-01-16 NOTE — Patient Instructions (Signed)
[place] at [facility]  Discharge Instructions:  Feraheme infusion received today. _______________________________________________________________  Thank you for choosing [place] at [facility] to provide your oncology and hematology care.  To afford each patient quality time with our providers, please arrive at least 15 minutes before your scheduled appointment.  You need to re-schedule your appointment if you arrive 10 or more minutes late.  We strive to give you quality time with our providers, and arriving late affects you and other patients whose appointments are after yours.  Also, if you no show three or more times for appointments you may be dismissed from the clinic.  Again, thank you for choosing [place] [facility] [place] hope is that these requests will allow you access to exceptional care and in a timely manner. _______________________________________________________________  If you have questions after your visit, please contact our office at [phone] between the hours of 8:30 a.m. and 5:00 p.m. Voicemails left after 4:30 p.m. will not be returned until the following business day. _______________________________________________________________  For prescription refill requests, have your pharmacy contact our office. _______________________________________________________________  Recommendations made by the consultant and any test results will be sent to your referring physician. _______________________________________________________________

## 2020-01-23 ENCOUNTER — Other Ambulatory Visit: Payer: Self-pay

## 2020-01-23 ENCOUNTER — Inpatient Hospital Stay (HOSPITAL_COMMUNITY): Payer: Medicare Other

## 2020-01-23 ENCOUNTER — Encounter (HOSPITAL_COMMUNITY): Payer: Self-pay

## 2020-01-23 VITALS — BP 114/62 | HR 80 | Temp 97.7°F | Resp 16

## 2020-01-23 DIAGNOSIS — D509 Iron deficiency anemia, unspecified: Secondary | ICD-10-CM | POA: Diagnosis not present

## 2020-01-23 DIAGNOSIS — E538 Deficiency of other specified B group vitamins: Secondary | ICD-10-CM

## 2020-01-23 MED ORDER — SODIUM CHLORIDE 0.9 % IV SOLN: Freq: Once | INTRAVENOUS | Status: AC

## 2020-01-23 MED ORDER — SODIUM CHLORIDE 0.9 % IV SOLN
510.0000 mg | Freq: Once | INTRAVENOUS | Status: AC
  Administered 2020-01-23: 510 mg via INTRAVENOUS
  Filled 2020-01-23: qty 510

## 2020-01-23 NOTE — Progress Notes (Signed)
Patient tolerated iron infusion with no complaints voiced.  Peripheral IV site clean and dry with good blood return noted before and after infusion.  Band aid applied.  VSS with discharge and left ambulatory with no s/s of distress noted.  

## 2020-02-11 ENCOUNTER — Other Ambulatory Visit: Payer: Self-pay

## 2020-02-11 ENCOUNTER — Inpatient Hospital Stay (HOSPITAL_COMMUNITY)
Admission: EM | Admit: 2020-02-11 | Discharge: 2020-02-13 | DRG: 382 | Disposition: A | Discharge status: Home / Self Care | Payer: Medicare Other | Attending: Internal Medicine | Admitting: Internal Medicine

## 2020-02-11 ENCOUNTER — Encounter (HOSPITAL_COMMUNITY): Payer: Self-pay | Admitting: *Deleted

## 2020-02-11 DIAGNOSIS — K2211 Ulcer of esophagus with bleeding: Secondary | ICD-10-CM | POA: Diagnosis not present

## 2020-02-11 DIAGNOSIS — I10 Essential (primary) hypertension: Secondary | ICD-10-CM | POA: Diagnosis not present

## 2020-02-11 DIAGNOSIS — K219 Gastro-esophageal reflux disease without esophagitis: Secondary | ICD-10-CM | POA: Diagnosis not present

## 2020-02-11 DIAGNOSIS — K921 Melena: Secondary | ICD-10-CM | POA: Diagnosis not present

## 2020-02-11 DIAGNOSIS — Z20822 Contact with and (suspected) exposure to covid-19: Secondary | ICD-10-CM | POA: Diagnosis present

## 2020-02-11 DIAGNOSIS — E785 Hyperlipidemia, unspecified: Secondary | ICD-10-CM | POA: Diagnosis present

## 2020-02-11 DIAGNOSIS — D649 Anemia, unspecified: Secondary | ICD-10-CM | POA: Diagnosis not present

## 2020-02-11 DIAGNOSIS — K922 Gastrointestinal hemorrhage, unspecified: Secondary | ICD-10-CM | POA: Diagnosis present

## 2020-02-11 DIAGNOSIS — K449 Diaphragmatic hernia without obstruction or gangrene: Secondary | ICD-10-CM | POA: Diagnosis present

## 2020-02-11 LAB — COMPREHENSIVE METABOLIC PANEL
ALT: 21 U/L (ref 0–44)
AST: 26 U/L (ref 15–41)
Albumin: 4.1 g/dL (ref 3.5–5.0)
Alkaline Phosphatase: 71 U/L (ref 38–126)
Anion gap: 9 (ref 5–15)
BUN: 31 mg/dL — ABNORMAL HIGH (ref 8–23)
CO2: 25 mmol/L (ref 22–32)
Calcium: 9.3 mg/dL (ref 8.9–10.3)
Chloride: 107 mmol/L (ref 98–111)
Creatinine, Ser: 0.78 mg/dL (ref 0.44–1.00)
GFR calc Af Amer: >60 mL/min (ref >60)
GFR calc non Af Amer: >60 mL/min (ref >60)
Glucose, Bld: 90 mg/dL (ref 70–99)
Potassium: 3.9 mmol/L (ref 3.5–5.1)
Sodium: 141 mmol/L (ref 135–145)
Total Bilirubin: 1 mg/dL (ref 0.3–1.2)
Total Protein: 6.9 g/dL (ref 6.5–8.1)

## 2020-02-11 LAB — CBC
HCT: 32.4 % — ABNORMAL LOW (ref 36.0–46.0)
Hemoglobin: 10.4 g/dL — ABNORMAL LOW (ref 12.0–15.0)
MCH: 31.6 pg (ref 26.0–34.0)
MCHC: 32.1 g/dL (ref 30.0–36.0)
MCV: 98.5 fL (ref 80.0–100.0)
Platelets: 263 10*3/uL (ref 150–400)
RBC: 3.29 MIL/uL — ABNORMAL LOW (ref 3.87–5.11)
RDW: 13.5 % (ref 11.5–15.5)
WBC: 8.3 10*3/uL (ref 4.0–10.5)
nRBC: 0 % (ref 0.0–0.2)

## 2020-02-11 LAB — POC OCCULT BLOOD, ED: Fecal Occult Bld: POSITIVE — AB

## 2020-02-11 MED ORDER — PANTOPRAZOLE SODIUM 40 MG IV SOLR
40.0000 mg | Freq: Once | INTRAVENOUS | Status: AC
  Administered 2020-02-11: 40 mg via INTRAVENOUS
  Filled 2020-02-11: qty 40

## 2020-02-11 MED ORDER — HYDROCHLOROTHIAZIDE 12.5 MG PO CAPS
12.5000 mg | ORAL_CAPSULE | Freq: Every day | ORAL | Status: DC
  Filled 2020-02-11 (×4): qty 1

## 2020-02-11 MED ORDER — PANTOPRAZOLE SODIUM 40 MG IV SOLR: 40.0000 mg | Freq: Once | INTRAVENOUS | Status: DC

## 2020-02-11 MED ORDER — ACETAMINOPHEN 325 MG PO TABS: 650.0000 mg | ORAL_TABLET | Freq: Four times a day (QID) | ORAL | Status: DC | PRN

## 2020-02-11 MED ORDER — DOCUSATE SODIUM 100 MG PO CAPS
100.0000 mg | ORAL_CAPSULE | Freq: Two times a day (BID) | ORAL | Status: DC
  Administered 2020-02-12 – 2020-02-13 (×2): 100 mg via ORAL
  Filled 2020-02-11 (×3): qty 1

## 2020-02-11 MED ORDER — LISINOPRIL 10 MG PO TABS
20.0000 mg | ORAL_TABLET | Freq: Every day | ORAL | Status: DC
  Administered 2020-02-11: 20 mg via ORAL
  Filled 2020-02-11 (×3): qty 2

## 2020-02-11 MED ORDER — ACETAMINOPHEN 650 MG RE SUPP: 650.0000 mg | Freq: Four times a day (QID) | RECTAL | Status: DC | PRN

## 2020-02-11 MED ORDER — LISINOPRIL-HYDROCHLOROTHIAZIDE 20-12.5 MG PO TABS: 1.0000 | ORAL_TABLET | Freq: Every day | ORAL | Status: DC

## 2020-02-11 MED ORDER — SODIUM CHLORIDE 0.9 % IV SOLN: INTRAVENOUS | Status: DC

## 2020-02-11 MED ORDER — PANTOPRAZOLE SODIUM 40 MG IV SOLR: 40.0000 mg | Freq: Two times a day (BID) | INTRAVENOUS | Status: DC

## 2020-02-11 MED ORDER — PANTOPRAZOLE SODIUM 40 MG IV SOLR
INTRAVENOUS | Status: AC
  Filled 2020-02-11: qty 80

## 2020-02-11 MED ORDER — SODIUM CHLORIDE 0.9 % IV SOLN
8.0000 mg/h | INTRAVENOUS | Status: DC
  Administered 2020-02-11 – 2020-02-12 (×2): 8 mg/h via INTRAVENOUS
  Filled 2020-02-11 (×7): qty 80

## 2020-02-11 NOTE — ED Notes (Signed)
Pt takes iron   Reports upper abd pain with dark stool for 24 hours   Here for eval

## 2020-02-11 NOTE — ED Notes (Signed)
Report to Tracey RN

## 2020-02-11 NOTE — ED Triage Notes (Signed)
Patient comes to the ED with upper abdominal pain accompanied by nausea and  dark stool for one day.  Patient states she does take Iron supplements.

## 2020-02-11 NOTE — H&P (Signed)
History and Physical    Wanda [name] [id number] DOB: 1932-11-29 DOA: 02/11/2020  PCP: [facility]   Patient coming from: home  I have personally briefly reviewed patient's old medical records in [place]  Chief Complaint: Black stools  HPI: Wanda [name] is a 86 y.o. 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 Early Saturday morning she felt ill. She had a BM with very dark stool with odd/bad odor. She stopped taking her oral iron supplement. Sunday, day of admission, she had a second very black stool and felt light-headed. For these symptoms,she present to [facility] for evaluation with a concern for recurrent UGI bleed.   ED Course: Hemodynamically stable. Lab revealed Hgb 10.4, down from 01/02/20 when Hgb was 11.6. She had iron infusions x 2 in the interval and takes oral iron. Stools was heme positive. Dr. Gala Romney, her GI MD was constacted - he recommended [facility] admit the patient, start IV PPI and he would plan on EGD tomorrow.   Review of Systems: As per HPI otherwise 10 point review of systems negative.    Past Medical History:  Diagnosis Date  . Anemia   . Cystocele   . Diverticulosis 10/20/10   Left-sided on colonoscopy by Dr. Gala Romney 10/20/10  . Gastric polyps 10/02/2013  . Gastritis 12/08/10   EGD by Dr. Altha Harm hiatal hernia, duodenal diverticulum, chronic gastritis  . GERD (gastroesophageal reflux disease)   . Heart murmur   . Hiatal hernia 10/02/2013  . HTN (hypertension)   . IDA (iron deficiency anemia)   . 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 Osteoarthritis   . Radial fracture    (right) Undergoing treatment by Dr. Aline Brochure currently  . Sleep apnea    STOP BANG SCORE 4  . Urge incontinence   . Vitamin D deficiency     Past Surgical History:  Procedure Laterality Date  . ABDOMINAL HYSTERECTOMY    .  ANTERIOR AND POSTERIOR REPAIR  05/17/2012   Procedure: ANTERIOR (CYSTOCELE) AND POSTERIOR REPAIR (RECTOCELE);  Surgeon: Jonnie Kind, MD;  Location: [facility] ORS;  Service: Gynecology;  Laterality: N/A;  . BIOPSY  11/22/2018   Procedure: BIOPSY;  Surgeon: Daneil Dolin, MD;  Location: [facility] ENDO SU[facility];  Service: Endoscopy;;  . COLONOSCOPY  02/29/2012   Dr. Gala Romney: colonic diverticulosis, minimal internal hemorrhoids, benign polyp  . ESOPHAGOGASTRODUODENOSCOPY  02/29/2012   Dr. Gala Romney: atonic esophagus, moderate-sized hiatal hernia, fundal gland polyps  . ESOPHAGOGASTRODUODENOSCOPY N/A 10/02/2013   Dr. Gala Romney- multiple Wilhemena Durie gastric polyps- not manipulated  . ESOPHAGOGASTRODUODENOSCOPY N/A 01/14/2015   Dr. Gala Romney: Large hiatal hernia, otherwise normal EGD  . ESOPHAGOGASTRODUODENOSCOPY (EGD) WITH PROPOFOL N/A 11/22/2018   Dr. Gala Romney: normal esophagus, large hiatal hernia, erosive gastropathy s/p biopsy, normal duodenum. Reactive gastropathy  . GIVENS CAPSULE STUDY  02/29/2012   No explanation for IDA. Possible extrinsic compression but negative CT.   Marland Kitchen SALPINGOOPHORECTOMY  05/17/2012   Procedure: SALPINGO OOPHERECTOMY;  Surgeon: Jonnie Kind, MD;  Location: [facility] OR[facility];  Service: Gynecology;  Laterality: Bilateral;  . VAGINAL HYSTERECTOMY  05/17/2012   Procedure: HYSTERECTOMY VAGINAL;  Surgeon: Jonnie Kind, MD;  Location: [facility] ORS;  Service: Gynecology;  Laterality: N/A;   Soc Hx - married for 50 years before being widowed. She and her husband lived apart the last several years but were neighbors. She has 3 sons, 3 grandchildren and 2 great-grands.  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 She lives alone, I-ADLs. Her family is close to her and checks on her frequently.   reports that she has never smoked. She has never used smokeless tobacco. She reports that she does not drink alcohol or use drugs.  Allergies  Allergen Reactions  . Sulfa Antibiotics     unknown  . Naproxen Sodium  Rash and Other (See Comments)    Passed out    Family History  Problem Relation Age of Onset  . Hypertension Mother   . Lymphoma Brother   . Anesthesia problems Neg Hx   . Hypotension Neg Hx   . Malignant hyperthermia Neg Hx   . Pseudochol deficiency Neg Hx   . Colon cancer Neg Hx      Prior to Admission medications   Medication Sig Start Date End Date Taking? Authorizing Provider  CALCIUM PO Take 300-600 mg by mouth daily.    Yes [provider]  cyanocobalamin (,VITAMIN B-12,) 1000 MCG/ML injection Inject 1 mL into the muscle every 30 (thirty) days. 09/06/19  Yes [provider]  ferrous sulfate 325 (65 FE) MG tablet Take 1 tablet (325 mg total) by mouth daily with breakfast. 12/12/19 02/11/20 Yes Carlis Stable, NP  lisinopril-hydrochlorothiazide (PRINZIDE,ZESTORETIC) 20-12.5 MG per tablet Take 1 tablet by mouth daily.   Yes [provider]  Multiple Vitamin (MULTIVITAMIN) tablet Take 1 tablet by mouth daily.    Yes [provider]  pantoprazole (PROTONIX) 40 MG tablet Take 1 tablet (40 mg total) by mouth daily. 10/03/19  Yes Annitta Needs, NP    Physical Exam: Vitals:   02/11/20 1715 02/11/20 1730 02/11/20 1800 02/11/20 1830  BP:  129/72 138/78 122/80  Pulse: 67     Resp: 15 15 16 14   Temp:      TempSrc:      SpO2: 100%     Weight:      Height:        Constitutional: NAD, calm, comfortable Vitals:   02/11/20 1715 02/11/20 1730 02/11/20 1800 02/11/20 1830  BP:  129/72 138/78 122/80  Pulse: 67     Resp: 15 15 16 14   Temp:      TempSrc:      SpO2: 100%     Weight:      Height:       General: WNWD woman looking younger than her stated age. Eyes: PERRL, lids and conjunctivae normal ENMT: Mucous membranes are moist. Posterior pharynx clear of any exudate or lesions.Normal dentition.  Neck: normal, supple, no masses, no thyromegaly Respiratory: clear to auscultation bilaterally, no wheezing, no crackles. Normal respiratory effort. No  accessory muscle use.  Cardiovascular:Irregular rate and rhythm, no murmurs / rubs / gallops. No extremity edema. 2+ pedal pulses. No carotid bruits.  Abdomen: no tenderness, no masses palpated. No hepatosplenomegaly. Bowel sounds positive.  Musculoskeletal: no clubbing / cyanosis. No joint deformity upper and lower extremities. Good ROM, no contractures. Normal muscle tone.  Skin: no rashes, lesions, ulcers. No induration Neurologic: CN 2-12 grossly intact. Strength 5/5 in all 4.  Psychiatric: Normal judgment and insight. Alert and oriented x 3. Normal mood.    Labs on Admission: I have personally reviewed following labs and imaging studies  CBC: Recent Labs  Lab 02/11/20 1531  WBC 8.3  HGB 10.4*  HCT 32.4*  MCV 98.5  PLT 99991111   Basic Metabolic Panel: Recent Labs  Lab 02/11/20 1531  NA 141  K 3.9  CL 107  CO2 25  GLUCOSE 90  BUN 31*  CREATININE 0.78  CALCIUM 9.3   GFR: Estimated Creatinine Clearance: 36.4 mL/min (by C-G formula based on SCr of 0.78 mg/dL). Liver Function Tests: Recent Labs  Lab 02/11/20 1531  AST 26  ALT 21  ALKPHOS 71  BILITOT 1.0  PROT 6.9  ALBUMIN 4.1   No results for input(s): LIPASE, AMYLASE in the last 168 hours. No results for input(s): AMMONIA in the last 168 hours. Coagulation Profile: No results for input(s): INR, PROTIME in the last 168 hours. Cardiac Enzymes: No results for input(s): CKTOTAL, CKMB, CKMBINDEX, TROPONINI in the last 168 hours. BNP (last 3 results) No results for input(s): PROBNP in the last 8760 hours. HbA1C: No results for input(s): HGBA1C in the last 72 hours. CBG: No results for input(s): GLUCAP in the last 168 hours. Lipid Profile: No results for input(s): CHOL, HDL, LDLCALC, TRIG, CHOLHDL, LDLDIRECT in the last 72 hours. Thyroid Function Tests: No results for input(s): TSH, T4TOTAL, FREET4, T3FREE, THYROIDAB in the last 72 hours. Anemia Panel: No results for input(s): VITAMINB12, FOLATE, FERRITIN, TIBC,  IRON, RETICCTPCT in the last 72 hours. Urine analysis:    Component Value Date/Time   COLORURINE YELLOW 11/05/2018 [place] 11/05/2018 1029   LABSPEC 1.008 11/05/2018 1029   PHURINE 8.0 11/05/2018 1029   GLUCOSEU NEGATIVE 11/05/2018 1029   HGBUR NEGATIVE 11/05/2018 1029   BILIRUBINUR NEGATIVE 11/05/2018 1029   KETONESUR NEGATIVE 11/05/2018 1029   PROTEINUR NEGATIVE 11/05/2018 1029   UROBILINOGEN 0.2 05/11/2012 1000   NITRITE NEGATIVE 11/05/2018 1029   LEUKOCYTESUR NEGATIVE 11/05/2018 1029    Radiological Exams on Admission: No results found.  EKG: Independently reviewed. EKG pending  Assessment/Plan Active Problems:   GI bleed   GERD   HTN (hypertension)   Normocytic anemia   Acute upper GI bleed  (please populate well all problems here in Problem List. (For example, if patient is on BP meds at home and you resume or decide to hold them, it is a problem that needs to be her. Same for CAD, COPD, HLD and so on)   1. GI - patient with h/o gastritis, GERD, [name] erosions of the esophagus now presenting with black stools and a drop in Hgb. Plan Med-surg admit  IV PPI  For GI consult/EGD in AM  2. HTN - stable. Continue home medications.  3. Cardiac - frequent PVCs on exam Plan  12 LEAD EKG to assess for AVB   DVT prophylaxis: SCDs  Code Status: full code  Family Communication: Spoke with son Abbe Amsterdam. Explained possible Dx and tx plan.  Disposition Plan: home 36-48 hrs Consults called: Dr. Gala Romney- GI (with names) Admission status: Observation   Adella Hare MD [facility] Pager [phone]  If 7PM-7AM, please contact night-coverage [email or web address] Password [facility]  02/11/2020, 7:04 PM

## 2020-02-11 NOTE — ED Provider Notes (Signed)
[facility] EMERGENCY DEPARTMENT Provider Note   CSN: [id number] Arrival date & time: 02/11/20  1418     History Chief Complaint  Patient presents with  . Abdominal Pain    Wanda [name] is a 86 y.o. female with a history as outlined below, including diverticulosis, gastric polyps, large hiatal hernia with a history of multiple [name] lesions and chronic iron deficiency anemia receiving B12 injections, also receiving iron infusions presenting with a 2-day history of black stool x2 and feeling generalized fatigue without other complaint, specifically no abdominal pain, diarrhea, nausea or vomiting.  She has had a reduced appetite however.  She describes 1 episode of well-formed stool yesterday but very black in character without overt bright red blood.  The stool was not sticky or tarry.  She had another bowel movement very similar today prior to arrival.  She has stopped her oral iron supplementation to see if this was perhaps the cause but without change.  Her last iron infusion was completed on March 9.  She denies lightheadedness, headache, but does endorse generalized fatigue.  HPI     Past Medical History:  Diagnosis Date  . Anemia   . Cystocele   . Diverticulosis 10/20/10   Left-sided on colonoscopy by Dr. Gala Romney 10/20/10  . Gastric polyps 10/02/2013  . Gastritis 12/08/10   EGD by Dr. Altha Harm hiatal hernia, duodenal diverticulum, chronic gastritis  . GERD (gastroesophageal reflux disease)   . Heart murmur   . Hiatal hernia 10/02/2013  . HTN (hypertension)   . IDA (iron deficiency anemia)   . 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 Osteoarthritis   . Radial fracture    (right) Undergoing treatment by Dr. Aline Brochure currently  . Sleep apnea    STOP BANG SCORE 4  . Urge incontinence   . Vitamin D deficiency     Patient Active Problem List   Diagnosis Date Noted  . Loss of weight 06/28/2019  . Sleep apnea 11/20/2018  . Hypokalemia 11/20/2018  . Leukocytosis  11/20/2018  . Symptomatic anemia 11/04/2018  . Diarrhea, functional 03/15/2015  . Fatigue 12/28/2014  . Upper abdominal pain 12/28/2014  . B12 deficiency 12/28/2014    Class: History of  . Acute blood loss anemia 10/02/2013  . GI bleed 10/02/2013  . Melena 10/02/2013  . Hematemesis 10/02/2013  . Sinus tachycardia 10/02/2013  . 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 Hiatal hernia 10/02/2013  . Gastric polyps 10/02/2013  . HTN (hypertension)   . IDA (iron deficiency anemia)   . Adhesive capsulitis of right shoulder 03/01/2012  . Iron deficiency anemia 02/02/2012  . Distal radius fracture 11/30/2011  . Diverticulosis 10/20/2010  . Normocytic anemia 10/01/2010  . GERD 10/01/2010  . FECAL OCCULT BLOOD 10/01/2010    Past Surgical History:  Procedure Laterality Date  . ABDOMINAL HYSTERECTOMY    . ANTERIOR AND POSTERIOR REPAIR  05/17/2012   Procedure: ANTERIOR (CYSTOCELE) AND POSTERIOR REPAIR (RECTOCELE);  Surgeon: Jonnie Kind, MD;  Location: [facility] ORS;  Service: Gynecology;  Laterality: N/A;  . BIOPSY  11/22/2018   Procedure: BIOPSY;  Surgeon: Daneil Dolin, MD;  Location: [facility];  Service: Endoscopy;;  . COLONOSCOPY  02/29/2012   Dr. Gala Romney: colonic diverticulosis, minimal internal hemorrhoids, benign polyp  . ESOPHAGOGASTRODUODENOSCOPY  02/29/2012   Dr. Gala Romney: atonic esophagus, moderate-sized hiatal hernia, fundal gland polyps  . ESOPHAGOGASTRODUODENOSCOPY N/A 10/02/2013   Dr. Gala Romney- multiple Wilhemena Durie gastric polyps- not manipulated  . ESOPHAGOGASTRODUODENOSCOPY N/A 01/14/2015   Dr. Gala Romney: Carolynn Sayers  hiatal hernia, otherwise normal EGD  . ESOPHAGOGASTRODUODENOSCOPY (EGD) WITH PROPOFOL N/A 11/22/2018   Dr. Gala Romney: normal esophagus, large hiatal hernia, erosive gastropathy s/p biopsy, normal duodenum. Reactive gastropathy  . GIVENS CAPSULE STUDY  02/29/2012   No explanation for IDA. Possible extrinsic compression but negative CT.   Marland Kitchen SALPINGOOPHORECTOMY  05/17/2012    Procedure: SALPINGO OOPHERECTOMY;  Surgeon: Jonnie Kind, MD;  Location: [facility];  Service: Gynecology;  Laterality: Bilateral;  . VAGINAL HYSTERECTOMY  05/17/2012   Procedure: HYSTERECTOMY VAGINAL;  Surgeon: Jonnie Kind, MD;  Location: [facility]S;  Service: Gynecology;  Laterality: N/A;     OB History   No obstetric history on file.     Family History  Problem Relation Age of Onset  . Hypertension Mother   . Lymphoma Brother   . Anesthesia problems Neg Hx   . Hypotension Neg Hx   . Malignant hyperthermia Neg Hx   . Pseudochol deficiency Neg Hx   . Colon cancer Neg Hx     Social History   Tobacco Use  . Smoking status: Never Smoker  . Smokeless tobacco: Never Used  . Tobacco comment: Never smoked  Substance Use Topics  . Alcohol use: No    Alcohol/week: 0.0 standard drinks  . Drug use: No    Home Medications Prior to Admission medications   Medication Sig Start Date End Date Taking? Authorizing Provider  CALCIUM PO Take 300-600 mg by mouth daily.    Yes [provider]  cyanocobalamin (,VITAMIN B-12,) 1000 MCG/ML injection Inject 1 mL into the muscle every 30 (thirty) days. 09/06/19  Yes [provider]  ferrous sulfate 325 (65 FE) MG tablet Take 1 tablet (325 mg total) by mouth daily with breakfast. 12/12/19 02/11/20 Yes Carlis Stable, NP  lisinopril-hydrochlorothiazide (PRINZIDE,ZESTORETIC) 20-12.5 MG per tablet Take 1 tablet by mouth daily.   Yes [provider]  Multiple Vitamin (MULTIVITAMIN) tablet Take 1 tablet by mouth daily.    Yes [provider]  pantoprazole (PROTONIX) 40 MG tablet Take 1 tablet (40 mg total) by mouth daily. 10/03/19  Yes Annitta Needs, NP    Allergies    Sulfa antibiotics and Naproxen sodium  Review of Systems   Review of Systems  Physical Exam Updated Vital Signs BP (!) 149/80 (BP Location: Left Arm)   Pulse 71   Temp 98.2 F (36.8 C) (Oral)   Resp 16   Ht 4\' 9"  (1.448 m)   Wt 52.2 kg   SpO2 97%    BMI 24.89 kg/m   Physical Exam Vitals and nursing note reviewed. Exam conducted with a chaperone present.  Constitutional:      Appearance: She is well-developed.  HENT:     Head: Normocephalic and atraumatic.  Eyes:     Conjunctiva/sclera: Conjunctivae normal.  Cardiovascular:     Rate and Rhythm: Normal rate and regular rhythm.     Heart sounds: Normal heart sounds.  Pulmonary:     Effort: Pulmonary effort is normal.     Breath sounds: Normal breath sounds. No wheezing.  Abdominal:     General: Bowel sounds are normal.     Palpations: Abdomen is soft.     Tenderness: There is no abdominal tenderness.  Genitourinary:    Rectum: Guaiac result positive.     Comments: Dark mucoid residue, strongly hemoccult positive. Musculoskeletal:        General: Normal range of motion.     Cervical back: Normal range of motion.  Skin:    General: Skin is warm and dry.  Neurological:     Mental Status: She is alert.     ED Results / Procedures / Treatments   Labs (all labs ordered are listed, but only abnormal results are displayed) Labs Reviewed  CBC - Abnormal; Notable for the following components:      Result Value   RBC 3.29 (*)    Hemoglobin 10.4 (*)    HCT 32.4 (*)    All other components within normal limits  COMPREHENSIVE METABOLIC PANEL - Abnormal; Notable for the following components:   BUN 31 (*)    All other components within normal limits  POC OCCULT BLOOD, ED - Abnormal; Notable for the following components:   Fecal Occult Bld POSITIVE (*)    All other components within normal limits  SAMPLE TO BLOOD BANK    EKG None  Radiology No results found.  Procedures Procedures (including critical care time)  Medications Ordered in ED Medications  pantoprazole (PROTONIX) injection 40 mg (40 mg Intravenous Given 02/11/20 1709)    ED Course  I have reviewed the triage vital signs and the nursing notes.  Pertinent labs & imaging results that were available  during my care of the patient were reviewed by me and considered in my medical decision making (see chart for details).    MDM Rules/Calculators/A&P                      Pt with history of large hiatal hernia with prior Lysbeth Galas lesions, no other known source of GI bleeding despite endo/colon scopes, most recently 11/2018,  Givens capsule in 2013.  Strongly hemoccult positive with elevated BUN suggesting upper GI source. Her hgb is  10.4 today, was 11.6  5 weeks ago.  Pt would benefit from admission.  Pending consult with GI and admission.    Pt signed out to Franchot Heidelberg, PA-C who will arrange admission    Final Clinical Impression(s) / ED Diagnoses Final diagnoses:  Gastrointestinal hemorrhage, unspecified gastrointestinal hemorrhage type    Rx / DC Orders ED Discharge Orders    None       Landis Martins 02/11/20 1713    Nat Christen, MD 02/11/20 [phone]

## 2020-02-11 NOTE — ED Notes (Signed)
Pt reports she is followed by Dr Sydell Axon for her ulcers  Has a new practitioner in [place] whom she has seen teleconference, but not in person   She reports she has had several units of blood in the past   Takes iron  Felt badly yesterday,dark stools, with 2 dark stools in the last 24 hours   Here for eval of same   IV, est, labs

## 2020-02-11 NOTE — ED Provider Notes (Signed)
Patient signed out to me by J Idol, PA-C.  Please previous notes for further history.  In brief, patient presenting for evaluation of GI bleed.  She has had 2 dark stools.  Hemoglobin dropped 1 g.  She is not on anticoagulation.  She is stable at this time.  Hemoccult positive.  Pending consult with GI and admission to hospitalist.  Discussed with Dr. Gala Romney from GI who will consult with the patient is admitted.  Recommends clear liquid diet, starting PPI.  Recommends patient be n.p.o. after midnight with plan for EGD tomorrow.  Requesting rapid Covid test.  Discussed with Dr Kallie Locks from [facility], pt to be admitted.    Franchot Heidelberg, PA-C 02/11/20 1740    Nat Christen, MD 02/11/20 Darlin Drop

## 2020-02-12 ENCOUNTER — Encounter (HOSPITAL_COMMUNITY): Payer: Self-pay | Admitting: Internal Medicine

## 2020-02-12 ENCOUNTER — Encounter (HOSPITAL_COMMUNITY)
Admission: EM | Disposition: A | Discharge status: Home / Self Care | Payer: Self-pay | Source: Home / Self Care | Attending: Internal Medicine

## 2020-02-12 ENCOUNTER — Observation Stay (HOSPITAL_COMMUNITY): Payer: Medicare Other | Admitting: Anesthesiology

## 2020-02-12 DIAGNOSIS — K2211 Ulcer of esophagus with bleeding: Secondary | ICD-10-CM | POA: Diagnosis present

## 2020-02-12 DIAGNOSIS — K922 Gastrointestinal hemorrhage, unspecified: Secondary | ICD-10-CM | POA: Diagnosis not present

## 2020-02-12 DIAGNOSIS — D649 Anemia, unspecified: Secondary | ICD-10-CM | POA: Diagnosis present

## 2020-02-12 DIAGNOSIS — K219 Gastro-esophageal reflux disease without esophagitis: Secondary | ICD-10-CM | POA: Diagnosis present

## 2020-02-12 DIAGNOSIS — Z20822 Contact with and (suspected) exposure to covid-19: Secondary | ICD-10-CM | POA: Diagnosis present

## 2020-02-12 DIAGNOSIS — E785 Hyperlipidemia, unspecified: Secondary | ICD-10-CM | POA: Diagnosis present

## 2020-02-12 DIAGNOSIS — I10 Essential (primary) hypertension: Secondary | ICD-10-CM | POA: Diagnosis present

## 2020-02-12 DIAGNOSIS — K449 Diaphragmatic hernia without obstruction or gangrene: Secondary | ICD-10-CM | POA: Diagnosis present

## 2020-02-12 DIAGNOSIS — K921 Melena: Secondary | ICD-10-CM | POA: Diagnosis present

## 2020-02-12 HISTORY — PX: BIOPSY: SHX5522

## 2020-02-12 HISTORY — PX: ESOPHAGOGASTRODUODENOSCOPY (EGD) WITH PROPOFOL: SHX5813

## 2020-02-12 LAB — HEMOGLOBIN AND HEMATOCRIT, BLOOD
HCT: 29.9 % — ABNORMAL LOW (ref 36.0–46.0)
Hemoglobin: 9.7 g/dL — ABNORMAL LOW (ref 12.0–15.0)

## 2020-02-12 LAB — SARS CORONAVIRUS 2 (TAT 6-24 HRS): SARS Coronavirus 2: NEGATIVE

## 2020-02-12 SURGERY — ESOPHAGOGASTRODUODENOSCOPY (EGD) WITH PROPOFOL
Anesthesia: General

## 2020-02-12 MED ORDER — KETAMINE HCL 50 MG/5ML IJ SOSY
PREFILLED_SYRINGE | INTRAMUSCULAR | Status: AC
  Filled 2020-02-12: qty 5

## 2020-02-12 MED ORDER — SODIUM CHLORIDE 0.9 % IV SOLN
INTRAVENOUS | Status: AC
  Filled 2020-02-12: qty 1000

## 2020-02-12 MED ORDER — LACTATED RINGERS IV SOLN: INTRAVENOUS | Status: DC | PRN

## 2020-02-12 MED ORDER — LIDOCAINE HCL (CARDIAC) PF 100 MG/5ML IV SOSY
PREFILLED_SYRINGE | INTRAVENOUS | Status: DC | PRN
  Administered 2020-02-12: 40 mg via INTRAVENOUS

## 2020-02-12 MED ORDER — KETAMINE HCL 10 MG/ML IJ SOLN
INTRAMUSCULAR | Status: DC | PRN
  Administered 2020-02-12: 10 mg via INTRAVENOUS

## 2020-02-12 MED ORDER — LIDOCAINE 2% (20 MG/ML) 5 ML SYRINGE
INTRAMUSCULAR | Status: AC
  Filled 2020-02-12: qty 5

## 2020-02-12 MED ORDER — EPHEDRINE SULFATE-NACL 50-0.9 MG/10ML-% IV SOSY
PREFILLED_SYRINGE | INTRAVENOUS | Status: DC | PRN
  Administered 2020-02-12: 5 mg via INTRAVENOUS

## 2020-02-12 MED ORDER — PROPOFOL 10 MG/ML IV BOLUS
INTRAVENOUS | Status: AC
  Filled 2020-02-12: qty 20

## 2020-02-12 MED ORDER — PROPOFOL 500 MG/50ML IV EMUL
INTRAVENOUS | Status: DC | PRN
  Administered 2020-02-12: 75 ug/kg/min via INTRAVENOUS

## 2020-02-12 MED ORDER — PROPOFOL 10 MG/ML IV BOLUS
INTRAVENOUS | Status: DC | PRN
  Administered 2020-02-12: 50 mg via INTRAVENOUS

## 2020-02-12 MED ORDER — PANTOPRAZOLE SODIUM 40 MG IV SOLR
INTRAVENOUS | Status: AC
  Filled 2020-02-12: qty 80

## 2020-02-12 MED ORDER — SUCRALFATE 1 GM/10ML PO SUSP
1.0000 g | Freq: Three times a day (TID) | ORAL | Status: DC
  Administered 2020-02-12 – 2020-02-13 (×3): 1 g via ORAL
  Filled 2020-02-12 (×3): qty 10

## 2020-02-12 MED ORDER — LACTATED RINGERS IV SOLN
Freq: Once | INTRAVENOUS | Status: AC
  Administered 2020-02-12: 1000 mL via INTRAVENOUS

## 2020-02-12 MED ORDER — PANTOPRAZOLE SODIUM 40 MG PO TBEC
40.0000 mg | DELAYED_RELEASE_TABLET | Freq: Two times a day (BID) | ORAL | Status: DC
  Administered 2020-02-12 – 2020-02-13 (×2): 40 mg via ORAL
  Filled 2020-02-12 (×3): qty 1

## 2020-02-12 NOTE — Anesthesia Preprocedure Evaluation (Signed)
Anesthesia Evaluation  Patient identified by MRN, date of birth, ID band Patient awake    Reviewed: Allergy & Precautions, NPO status , Patient's Chart, lab work & pertinent test results  History of Anesthesia Complications Negative for: history of anesthetic complications  Airway Mallampati: II  TM Distance: >3 FB Neck ROM: Full    Dental  (+) Dental Advidsory Given, Missing, Caps   Pulmonary sleep apnea ,    breath sounds clear to auscultation       Cardiovascular Exercise Tolerance: Good hypertension, Pt. on medications + Valvular Problems/Murmurs (systolic murmur)  Rhythm:Regular Rate:Normal + Systolic murmurs [id number] 19:06:43 [facility]-ER ROUTINE RECORD Sinus rhythm Multiple premature complexes, vent & supraven Low voltage, precordial leads Confirmed by Randal Buba, April ([id number]) on 02/12/2020 8:17:43 AM   Neuro/Psych    GI/Hepatic hiatal hernia, PUD, GERD  Medicated and Controlled,  Endo/Other    Renal/GU      Musculoskeletal  (+) Arthritis , Osteoarthritis,    Abdominal   Peds  Hematology  (+) Blood dyscrasia, anemia ,   Anesthesia Other Findings   Reproductive/Obstetrics                            Anesthesia Physical  Anesthesia Plan  ASA: III  Anesthesia Plan: General   Post-op Pain Management:    Induction: Intravenous  PONV Risk Score and Plan: 2 and Treatment may vary due to age or medical condition  Airway Management Planned: Nasal Cannula, Natural Airway and Simple Face Mask  Additional Equipment:   Intra-op Plan:   Post-operative Plan:   Informed Consent: I have reviewed the patients History and Physical, chart, labs and discussed the procedure including the risks, benefits and alternatives for the proposed anesthesia with the patient or authorized representative who has indicated his/her understanding and acceptance.     Dental Advisory  Given  Plan Discussed with: CRNA and Surgeon  Anesthesia Plan Comments:        Anesthesia Quick Evaluation

## 2020-02-12 NOTE — Anesthesia Postprocedure Evaluation (Signed)
Anesthesia Post Note  Patient: Wanda [name]  Procedure(s) Performed: ESOPHAGOGASTRODUODENOSCOPY (EGD) WITH PROPOFOL (N/A ) BIOPSY  Patient location during evaluation: PACU Anesthesia Type: General Level of consciousness: awake and alert, oriented and sedated Pain management: pain level controlled Vital Signs Assessment: post-procedure vital signs reviewed and stable Respiratory status: spontaneous breathing, nonlabored ventilation and respiratory function stable Cardiovascular status: blood pressure returned to baseline Postop Assessment: no apparent nausea or vomiting Anesthetic complications: no     Last Vitals:  Vitals:   02/12/20 1653 02/12/20 1700  BP: (!) 100/50 (!) 115/54  Pulse: 72 63  Resp: 20 20  Temp: 36.8 C   SpO2: 100% 100%    Last Pain:  Vitals:   02/12/20 1700  TempSrc:   PainSc: 0-No pain                 Wanda [name]

## 2020-02-12 NOTE — Op Note (Signed)
[facility] Patient Name: Wanda [name] Procedure Date: 02/12/2020 3:57 PM MRN: [id number] Date of Birth: 12-28-1932 Attending MD: Norvel Richards , MD CSN: [id number] Age: 86 Admit Type: Inpatient Procedure:                Upper GI endoscopy Indications:              Melena Providers:                Norvel Richards, MD, Gwynneth Albright RN,                            RN Referring MD:              Medicines:                Propofol per Anesthesia Complications:            No immediate complications. Estimated Blood Loss:     Estimated blood loss was minimal. Procedure:                Pre-Anesthesia Assessment:                           - Prior to the procedure, a History and Physical                            was performed, and patient medications and                            allergies were reviewed. The patient's tolerance of                            previous anesthesia was also reviewed. The risks                            and benefits of the procedure and the sedation                            options and risks were discussed with the patient.                            All questions were answered, and informed consent                            was obtained. Prior Anticoagulants: The patient has                            taken no previous anticoagulant or antiplatelet                            agents. ASA Grade Assessment: III - A patient with                            severe systemic disease. After reviewing the risks  and benefits, the patient was deemed in                            satisfactory condition to undergo the procedure.                           After obtaining informed consent, the endoscope was                            passed under direct vision. Throughout the                            procedure, the patient's blood pressure, pulse, and                            oxygen saturations were monitored  continuously. The                            [id number]) scope was introduced through the                            mouth, and advanced to the second part of duodenum.                            The upper GI endoscopy was accomplished without                            difficulty. The patient tolerated the procedure                            well. Scope In: 4:37:38 PM Scope Out: 4:44:44 PM Total Procedure Duration: 0 hours 7 minutes 6 seconds  Findings:      The examined esophagus was normal. GE junction 29 cm from incisors;       hiatus 39 cm from the incisors. Friable gastric mucosa      A large hiatal hernia was present. Excoriations involving the gastric       mucosa straddling the diaphragmatic hiatus ([name] lesions); minimal       polypoid appearing mucosa. No ulcer or infiltrating process. Patent       pylorus.      The duodenal bulb and second portion of the duodenum were normal.       Finally, abnormal appearing mucosa was biopsied with a cold forceps for       histology. Estimated blood loss was minimal. Impression:               - Normal esophagus.                           - Large hiatal hernia. Gastric mucosal disruption                            at level of diaphragmatic hiatus?"Lysbeth Galas lesions                            -likely site of blood  loss. Status post gastric                            mucosal biopsy.                           - Normal duodenal bulb and second portion of the                            duodenum. Moderate Sedation:      Moderate (conscious) sedation was personally administered by an       anesthesia professional. The following parameters were monitored: oxygen       saturation, heart rate, blood pressure, respiratory rate, EKG, adequacy       of pulmonary ventilation, and response to care. Recommendation:           - Patient has a contact number available for                            emergencies. The signs and symptoms of potential                             delayed complications were discussed with the                            patient. Return to normal activities tomorrow.                            Written discharge instructions were provided to the                            patient.                           - Cardiac diet.                           - Continue present medications. Increase Protonix                            to 40 mg twice daily. Carafate slurry 1 g before                            meals and at bedtime x5 days                           - Return to my office in 6 weeks. From a GI                            standpoint, may be able to go home tomorrow. I                            discussed my findings and recommendations at length  with her son Arnette Norris at [phone] Procedure Code(s):        --- Professional ---                           [phone], Esophagogastroduodenoscopy, flexible,                            transoral; with biopsy, single or multiple Diagnosis Code(s):        --- Professional ---                           K44.9, Diaphragmatic hernia without obstruction or                            gangrene                           K92.1, Melena (includes Hematochezia) CPT copyright 2019 American Medical Association. All rights reserved. The codes documented in this report are preliminary and upon coder review may  be revised to meet current compliance requirements. [name]. Jacory Kamel, MD Norvel Richards, MD 02/12/2020 5:14:12 PM This report has been signed electronically. Number of Addenda: 0

## 2020-02-12 NOTE — Care Management Obs Status (Signed)
[facility] NOTIFICATION   Patient Details  Name: Wanda [name] MRN: [id number] Date of Birth: [birth year]   Medicare Observation Status Notification Given:  Yes(spoke with son telephonically, copy placed at bedside as requested, patient is in procedure)    Tommy Medal 02/12/2020, 3:24 PM

## 2020-02-12 NOTE — Consult Note (Addendum)
Referring Provider: Dr. Manuella Ghazi  Primary Care Physician:  [facility] Primary Gastroenterologist:  Dr. Gala Romney   Date of Admission: 02/11/20 Date of Consultation: 02/12/20  Reason for Consultation:  GI bleed  HPI:  Wanda [name] is an 86 y.o. year old delightful female with history of IDA, inpatient Jan 2020 with anemia. EGD at that time performed during hospitalization with normal esophagus, large hiatal hernia, erosive gastropathy s/p biopsy, normal duodenum. Reactive gastropathy. Last colonoscopy in 2013 with colonic diverticulosis. Prior evaluation for IDA with capsule study 2013 without explanation for IDA. She has declined updated colonoscopy at recent visits as outpatient. Hgb has been in the 11 range over past few months, presenting with Hgb 10.4 this admission and 9.7 this morning. Followed by Hematology, receiving iron infusion X 2 earlier this month. Presented this admission with reports of melena. Heme positive.   Woke up Saturday morning with nausea. No vomiting. Her stool was blacker than normal on iron, so she held that day's dose. Felt tired and fatigued, little energy all day. Laid around all day. Sunday had another bowel movement that was "black as tar". Nausea subsided by time of presentation to the ED. Noted a mild, fleeting abdominal discomfort at umbilicus over the weekend but states it "wasn't much to notice". Energy is better since admission. No further black stool since Sunday. No dysphagia. PPI daily. No NSAIDs. Nausea resolved. States she has been regurgitating liquids and sometimes food, burping with improvement. Feels like food will back up and then she feels better after regurgitating. Does not happen often.    Past Medical History:  Diagnosis Date  . Anemia   . Cystocele   . Diverticulosis 10/20/10   Left-sided on colonoscopy by Dr. Gala Romney 10/20/10  . Gastric polyps 10/02/2013  . Gastritis 12/08/10   EGD by Dr. Altha Harm hiatal hernia,  duodenal diverticulum, chronic gastritis  . GERD (gastroesophageal reflux disease)   . Heart murmur   . Hiatal hernia 10/02/2013  . HTN (hypertension)   . IDA (iron deficiency anemia)   . 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 Osteoarthritis   . Radial fracture    (right) Undergoing treatment by Dr. Aline Brochure currently  . Sleep apnea    STOP BANG SCORE 4  . Urge incontinence   . Vitamin D deficiency     Past Surgical History:  Procedure Laterality Date  . ABDOMINAL HYSTERECTOMY    . ANTERIOR AND POSTERIOR REPAIR  05/17/2012   Procedure: ANTERIOR (CYSTOCELE) AND POSTERIOR REPAIR (RECTOCELE);  Surgeon: Jonnie Kind, MD;  Location: [facility];  Service: Gynecology;  Laterality: N/A;  . BIOPSY  11/22/2018   Procedure: BIOPSY;  Surgeon: Daneil Dolin, MD;  Location: [facility];  Service: Endoscopy;;  . COLONOSCOPY  02/29/2012   Dr. Gala Romney: colonic diverticulosis, minimal internal hemorrhoids, benign polyp  . ESOPHAGOGASTRODUODENOSCOPY  02/29/2012   Dr. Gala Romney: atonic esophagus, moderate-sized hiatal hernia, fundal gland polyps  . ESOPHAGOGASTRODUODENOSCOPY N/A 10/02/2013   Dr. Gala Romney- multiple Wilhemena Durie gastric polyps- not manipulated  . ESOPHAGOGASTRODUODENOSCOPY N/A 01/14/2015   Dr. Gala Romney: Large hiatal hernia, otherwise normal EGD  . ESOPHAGOGASTRODUODENOSCOPY (EGD) WITH PROPOFOL N/A 11/22/2018   Dr. Gala Romney: normal esophagus, large hiatal hernia, erosive gastropathy s/p biopsy, normal duodenum. Reactive gastropathy  . GIVENS CAPSULE STUDY  02/29/2012   No explanation for IDA. Possible extrinsic compression but negative CT.   Marland Kitchen SALPINGOOPHORECTOMY  05/17/2012   Procedure: SALPINGO OOPHERECTOMY;  Surgeon: Jonnie Kind, MD;  Location: [facility];  Service: Gynecology;  Laterality: Bilateral;  . VAGINAL HYSTERECTOMY  05/17/2012   Procedure: HYSTERECTOMY VAGINAL;  Surgeon: Jonnie Kind, MD;  Location: [facility];  Service: Gynecology;  Laterality: N/A;    Prior to Admission medications    Medication Sig Start Date End Date Taking? Authorizing Provider  CALCIUM PO Take 300-600 mg by mouth daily.    Yes [provider]  cyanocobalamin (,VITAMIN B-12,) 1000 MCG/ML injection Inject 1 mL into the muscle every 30 (thirty) days. 09/06/19  Yes [provider]  ferrous sulfate 325 (65 FE) MG tablet Take 1 tablet (325 mg total) by mouth daily with breakfast. 12/12/19 02/11/20 Yes Carlis Stable, NP  lisinopril-hydrochlorothiazide (PRINZIDE,ZESTORETIC) 20-12.5 MG per tablet Take 1 tablet by mouth daily.   Yes [provider]  Multiple Vitamin (MULTIVITAMIN) tablet Take 1 tablet by mouth daily.    Yes [provider]  pantoprazole (PROTONIX) 40 MG tablet Take 1 tablet (40 mg total) by mouth daily. 10/03/19  Yes Annitta Needs, NP    Current Facility-Administered Medications  Medication Dose Route Frequency Provider Last Rate Last Admin  . 0.9 %  sodium chloride infusion   Intravenous Continuous Norins, Heinz Knuckles, MD 10 mL/hr at 02/11/20 2013 New Bag at 02/11/20 2013  . acetaminophen (TYLENOL) tablet 650 mg  650 mg Oral Q6H PRN Norins, Heinz Knuckles, MD       Or  . acetaminophen (TYLENOL) suppository 650 mg  650 mg Rectal Q6H PRN Norins, Heinz Knuckles, MD      . docusate sodium (COLACE) capsule 100 mg  100 mg Oral BID Norins, Heinz Knuckles, MD      . lisinopril (ZESTRIL) tablet 20 mg  20 mg Oral Daily Norins, Heinz Knuckles, MD   20 mg at 02/11/20 2025   And  . hydrochlorothiazide (MICROZIDE) capsule 12.5 mg  12.5 mg Oral Daily Norins, Heinz Knuckles, MD      . pantoprazole (PROTONIX) 80 mg in sodium chloride 0.9 % 100 mL (0.8 mg/mL) infusion  8 mg/hr Intravenous Continuous Norins, Heinz Knuckles, MD 10 mL/hr at 02/12/20 0636 8 mg/hr at 02/12/20 0636  . [START ON 02/15/2020] pantoprazole (PROTONIX) injection 40 mg  40 mg Intravenous Q12H Norins, Heinz Knuckles, MD      . pantoprazole (PROTONIX) injection 40 mg  40 mg Intravenous Once Norins, Heinz Knuckles, MD        Allergies as of 02/11/2020 -  Review Complete 02/11/2020  Allergen Reaction Noted  . Sulfa antibiotics  06/23/2016  . Naproxen sodium Rash and Other (See Comments)     Family History  Problem Relation Age of Onset  . Hypertension Mother   . Lymphoma Brother   . Anesthesia problems Neg Hx   . Hypotension Neg Hx   . Malignant hyperthermia Neg Hx   . Pseudochol deficiency Neg Hx   . Colon cancer Neg Hx     Social History   Socioeconomic History  . Marital status: Widowed    Spouse name: Not on file  . Number of children: Not on file  . Years of education: Not on file  . Highest education level: Not on file  Occupational History  . Occupation: Retired    Fish farm manager: RETIRED  Tobacco Use  . Smoking status: Never Smoker  . Smokeless tobacco: Never Used  . Tobacco comment: Never smoked  Substance and Sexual Activity  . Alcohol use: No    Alcohol/week: 0.0 standard drinks  . Drug use: No  . Sexual activity: Not  Currently    Birth control/protection: Surgical  Other Topics Concern  . Not on file  Social History Narrative  . Not on file   Social Determinants of Health   Financial Resource Strain:   . Difficulty of Paying Living Expenses:   Food Insecurity:   . Worried About Charity fundraiser in the Last Year:   . Arboriculturist in the Last Year:   Transportation Needs:   . Film/video editor (Medical):   Marland Kitchen Lack of Transportation (Non-Medical):   Physical Activity:   . Days of Exercise per Week:   . Minutes of Exercise per Session:   Stress:   . Feeling of Stress :   Social Connections:   . Frequency of Communication with Friends and Family:   . Frequency of Social Gatherings with Friends and Family:   . Attends Religious Services:   . Active Member of Clubs or Organizations:   . Attends Archivist Meetings:   Marland Kitchen Marital Status:   Intimate Partner Violence:   . Fear of Current or Ex-Partner:   . Emotionally Abused:   Marland Kitchen Physically Abused:   . Sexually Abused:     Review of  Systems: Gen: see HPI CV: Denies chest pain, heart palpitations, syncope, edema  Resp: Denies shortness of breath with rest, cough, wheezing GI: see HPI GU : Denies urinary burning, urinary frequency, urinary incontinence.  MS: Denies joint pain,swelling, cramping Derm: Denies rash, itching, dry skin Psych: Denies depression, anxiety,confusion, or memory loss Heme: see HPI  Physical Exam: Vital signs in last 24 hours: Temp:  [97.7 F (36.5 C)-98.2 F (36.8 C)] 97.8 F (36.6 C) (03/29 0411) Pulse Rate:  [67-103] 75 (03/29 0411) Resp:  [14-26] 18 (03/29 0411) BP: (83-149)/(53-92) 83/58 (03/29 0411) SpO2:  [96 %-100 %] 96 % (03/29 0411) Weight:  [51.1 kg-52.2 kg] 51.1 kg (03/29 0411) Last BM Date: 02/11/20 General:   Alert,  Well-developed, well-nourished, pleasant and cooperative in NAD Head:  Normocephalic and atraumatic. Eyes:  Sclera clear, no icterus.   Conjunctiva pink. Ears:  Normal auditory acuity. Nose:  No deformity, discharge,  or lesions. Lungs:  Clear throughout to auscultation.    Heart:  S1 S2 present with systolic murmur  Abdomen:  Soft, nontender and nondistended. No masses, hepatosplenomegaly or hernias noted. Normal bowel sounds, without guarding, and without rebound.   Rectal:  Deferred  Msk:  Symmetrical without gross deformities. Normal posture. Extremities:  Without  edema. Neurologic:  Alert and  oriented x4 Psych:  Alert and cooperative. Normal mood and affect.  Intake/Output from previous day: 03/28 0701 - 03/29 0700 In: 131.2 [I.V.:131.2] Out: -  Intake/Output this shift: No intake/output data recorded.  Lab Results: Recent Labs    02/11/20 1531 02/12/20 0511  WBC 8.3  --   HGB 10.4* 9.7*  HCT 32.4* 29.9*  PLT 263  --    BMET Recent Labs    02/11/20 1531  NA 141  K 3.9  CL 107  CO2 25  GLUCOSE 90  BUN 31*  CREATININE 0.78  CALCIUM 9.3   LFT Recent Labs    02/11/20 1531  PROT 6.9  ALBUMIN 4.1  AST 26  ALT 21  ALKPHOS 71   BILITOT 1.0     Impression: Delightful 86 year old female with history of IDA and followed by Hematology, presenting with reported melena, heme positive stool, and drift in Hgb from baseline 11 range to 10.4 on admission and 9.7 today. Prior evaluation including  EGD Jan 2020 with known large hiatal hernia, erosive gastropathy, capsule in 2013 without explanation for IDA, and remote colonoscopy in 2013 with diverticulosis. Documented [name] lesions on prior EGD. She has abstained from NSAIDs and continues on PPI. However, she has noted some regurgitation of liquids and sometimes solids but denying dysphagia; likely related to large hernia and possible progression of this. Followed by Hematology for IDA and recently received iron infusion X 2 earlier this month.  As she has had drift from baseline and reported melena, recommend EGD this admission. May need capsule if EGD is unrevealing. Although last colonoscopy in 2013, doubt dealing with occult lower GI lesion and suspect UGI source.   COVID negative this admission.    Plan: Remain NPO Protonix infusion started on admission: continue for now until EGD completed Will discuss timing of EGD with Dr. Gala Romney. Patient aware of risks and benefits, with stated understanding and desire to proceed. Further recommendations to follow   Annitta Needs, PhD, ANP-BC [facility]     LOS: 0 days    02/12/2020, 7:24 AM  3/29 at 1000: pursue EGD with Propofol today.

## 2020-02-12 NOTE — Plan of Care (Signed)

## 2020-02-12 NOTE — Progress Notes (Signed)
PROGRESS NOTE    Wanda [name]  [id number] DOB: August 07, 1933 DOA: 02/11/2020 PCP: [facility]   Brief Narrative:  Per HPI: Wanda [name] is a 86 y.o. 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 Early Saturday morning she felt ill. She had a BM with very dark stool with odd/bad odor. She stopped taking her oral iron supplement. Sunday, day of admission, she had a second very black stool and felt light-headed. For these symptoms,she present [facility] evaluation with a concern for recurrent UGI bleed.   3/29:Patient has undergone EGD with no active bleeding noted. She has a large hiatal hernia with Lysbeth Galas lesions which were the likely sites of blood loss. Started on PPI BID as well as carafate. Plan to recheck labs in am and then anticipate discharge if stable and tolerating diet.  Assessment & Plan:   Active Problems:   Normocytic anemia   GERD   GI bleed   HTN (hypertension)   Acute upper GI bleed   Acute upper GI bleed from [facility] lesions -No active bleeding noted on EGD -Pt to remain on carafate slurry as well as PPI bid -Diet resumed and patient likely to dc in am if stable and tolerating -Repeat CBC in am  HTN -Continue home medications -Currently stable   DVT prophylaxis:SCDs Code Status: Full Family Communication: None at bedside Disposition Plan: Start PPI bid with carafate slurry and then anticipate DC by am. Follow up am labs.   Consultants:   None  Procedures:   None  Antimicrobials:   None   Subjective: Patient seen and evaluated today with no new acute complaints or concerns. No acute concerns or events noted overnight.  Objective: Vitals:   02/12/20 1458 02/12/20 1653 02/12/20 1700 02/12/20 1715  BP: 123/65 (!) 100/50 (!) 115/54 (!) 112/57  Pulse: 74 72 63 68  Resp: 18 20 20 15   Temp: 98.3  F (36.8 C) 98.2 F (36.8 C)    TempSrc: Oral     SpO2: 100% 100% 100% 99%  Weight:      Height:        Intake/Output Summary (Last 24 hours) at 02/12/2020 1815 Last data filed at 02/12/2020 1714 Gross per 24 hour  Intake 1096.79 ml  Output --  Net 1096.79 ml   Filed Weights   02/11/20 1427 02/12/20 0411  Weight: 52.2 kg 51.1 kg    Examination:  General exam: Appears calm and comfortable  Respiratory system: Clear to auscultation. Respiratory effort normal. Cardiovascular system: S1 & S2 heard, RRR. No JVD, murmurs, rubs, gallops or clicks. No pedal edema. Gastrointestinal system: Abdomen is nondistended, soft and nontender. No organomegaly or masses felt. Normal bowel sounds heard. Central nervous system: Alert and oriented. No focal neurological deficits. Extremities: Symmetric 5 x 5 power. Skin: No rashes, lesions or ulcers Psychiatry: Judgement and insight appear normal. Mood & affect appropriate.     Data Reviewed: I have personally reviewed following labs and imaging studies  CBC: Recent Labs  Lab 02/11/20 1531 02/12/20 0511  WBC 8.3  --   HGB 10.4* 9.7*  HCT 32.4* 29.9*  MCV 98.5  --   PLT 263  --    Basic Metabolic Panel: Recent Labs  Lab 02/11/20 1531  NA 141  K 3.9  CL 107  CO2 25  GLUCOSE 90  BUN 31*  CREATININE 0.78  CALCIUM 9.3  GFR: Estimated Creatinine Clearance: 36 mL/min (by C-G formula based on SCr of 0.78 mg/dL). Liver Function Tests: Recent Labs  Lab 02/11/20 1531  AST 26  ALT 21  ALKPHOS 71  BILITOT 1.0  PROT 6.9  ALBUMIN 4.1   No results for input(s): LIPASE, AMYLASE in the last 168 hours. No results for input(s): AMMONIA in the last 168 hours. Coagulation Profile: No results for input(s): INR, PROTIME in the last 168 hours. Cardiac Enzymes: No results for input(s): CKTOTAL, CKMB, CKMBINDEX, TROPONINI in the last 168 hours. BNP (last 3 results) No results for input(s): PROBNP in the last 8760 hours. HbA1C: No  results for input(s): HGBA1C in the last 72 hours. CBG: No results for input(s): GLUCAP in the last 168 hours. Lipid Profile: No results for input(s): CHOL, HDL, LDLCALC, TRIG, CHOLHDL, LDLDIRECT in the last 72 hours. Thyroid Function Tests: No results for input(s): TSH, T4TOTAL, FREET4, T3FREE, THYROIDAB in the last 72 hours. Anemia Panel: No results for input(s): VITAMINB12, FOLATE, FERRITIN, TIBC, IRON, RETICCTPCT in the last 72 hours. Sepsis Labs: No results for input(s): PROCALCITON, LATICACIDVEN in the last 168 hours.  Recent Results (from the past 240 hour(s))  SARS CORONAVIRUS 2 (TAT 6-24 HRS) Nasopharyngeal Nasopharyngeal Swab     Status: None   Collection Time: 02/11/20  5:40 PM   Specimen: Nasopharyngeal Swab  Result Value Ref Range Status   SARS Coronavirus 2 NEGATIVE NEGATIVE Final    Comment: (NOTE) SARS-CoV-2 target nucleic acids are NOT DETECTED. The SARS-CoV-2 RNA is generally detectable in upper and lower respiratory specimens during the acute phase of infection. Negative results do not preclude SARS-CoV-2 infection, do not rule out co-infections with other pathogens, and should not be used as the sole basis for treatment or other patient management decisions. Negative results must be combined with clinical observations, patient history, and epidemiological information. The expected result is Negative. Fact Sheet for Patients: SugarRoll.be Fact Sheet for Healthcare Providers: https://www.woods-mathews.com/ This test is not yet approved or cleared by the [place] FDA and  has been authorized for detection and/or diagnosis of SARS-CoV-2 by FDA under an Emergency Use Authorization (EUA). This EUA will remain  in effect (meaning this test can be used) for the duration of the COVID-19 declaration under Section 56 4(b)(1) of the Act, 21 U.S.C. section 360bbb-3(b)(1), unless the authorization is terminated or revoked  sooner. Performed at [facility], [place] [address], [place],  [address]          Radiology Studies: No results found.      Scheduled Meds: . docusate sodium  100 mg Oral BID  . lisinopril  20 mg Oral Daily   And  . hydrochlorothiazide  12.5 mg Oral Daily  . pantoprazole  40 mg Oral BID AC  . sucralfate  1 g Oral TID WC & HS   Continuous Infusions: . sodium chloride 10 mL/hr at 02/11/20 2013     LOS: 0 days    Time spent: 30 minutes    Tomica Arseneault Darleen Crocker, DO [facility]  If 7PM-7AM, please contact night-coverage [email or web address] 02/12/2020, 6:15 PM

## 2020-02-12 NOTE — Transfer of Care (Signed)
Immediate Anesthesia Transfer of Care Note  Patient: Wanda [name]  Procedure(s) Performed: ESOPHAGOGASTRODUODENOSCOPY (EGD) WITH PROPOFOL (N/A ) BIOPSY  Patient Location: PACU  Anesthesia Type:General  Level of Consciousness: awake, alert , oriented and sedated  Airway & Oxygen Therapy: Patient Spontanous Breathing and Patient connected to nasal cannula oxygen  Post-op Assessment: Report given to RN and Post -op Vital signs reviewed and stable  Post vital signs: Reviewed and stable  Last Vitals:  Vitals Value Taken Time  BP 100/50 02/12/20 1653  Temp 36.8 C 02/12/20 1653  Pulse 63 02/12/20 1700  Resp 20 02/12/20 1700  SpO2 100 % 02/12/20 1700  Vitals shown include unvalidated device data.  Last Pain:  Vitals:   02/12/20 1458  TempSrc: Oral  PainSc: 0-No pain      Patients Stated Pain Goal: 8 ([id number])  Complications: No apparent anesthesia complications

## 2020-02-13 ENCOUNTER — Telehealth: Payer: Self-pay | Admitting: Internal Medicine

## 2020-02-13 LAB — CBC
HCT: 28.9 % — ABNORMAL LOW (ref 36.0–46.0)
Hemoglobin: 9.4 g/dL — ABNORMAL LOW (ref 12.0–15.0)
MCH: 32.5 pg (ref 26.0–34.0)
MCHC: 32.5 g/dL (ref 30.0–36.0)
MCV: 100 fL (ref 80.0–100.0)
Platelets: 230 10*3/uL (ref 150–400)
RBC: 2.89 MIL/uL — ABNORMAL LOW (ref 3.87–5.11)
RDW: 13.4 % (ref 11.5–15.5)
WBC: 6.7 10*3/uL (ref 4.0–10.5)
nRBC: 0 % (ref 0.0–0.2)

## 2020-02-13 LAB — BASIC METABOLIC PANEL
Anion gap: 8 (ref 5–15)
BUN: 21 mg/dL (ref 8–23)
CO2: 26 mmol/L (ref 22–32)
Calcium: 8.8 mg/dL — ABNORMAL LOW (ref 8.9–10.3)
Chloride: 108 mmol/L (ref 98–111)
Creatinine, Ser: 0.78 mg/dL (ref 0.44–1.00)
GFR calc Af Amer: >60 mL/min (ref >60)
GFR calc non Af Amer: >60 mL/min (ref >60)
Glucose, Bld: 101 mg/dL — ABNORMAL HIGH (ref 70–99)
Potassium: 4.2 mmol/L (ref 3.5–5.1)
Sodium: 142 mmol/L (ref 135–145)

## 2020-02-13 MED ORDER — SUCRALFATE 1 GM/10ML PO SUSP: 1.0000 g | Freq: Three times a day (TID) | ORAL | 0 refills | Status: DC

## 2020-02-13 MED ORDER — PANTOPRAZOLE SODIUM 40 MG PO TBEC: 40.0000 mg | DELAYED_RELEASE_TABLET | Freq: Two times a day (BID) | ORAL | 2 refills | Status: DC

## 2020-02-13 NOTE — Telephone Encounter (Signed)
PATIENT SON CALLED AND SAID THAT [facility] GAVE HER A PRESCRIPTION FOR CARAFATE BUT IT IS VERY EXPENSIVE AND REQUIRES A PRIOR AUTH  PATIENT IS SUPPOSED TO TAKE IT STARTING TODAY, CAN YOU PLEASE CALL HER WHEN THE AUTH IS COMPLETE AND SHE CAN PICK IT UP

## 2020-02-13 NOTE — Discharge Summary (Signed)
Physician Discharge Summary  Wanda [name] [id number] DOB: April 17, 1933 DOA: 02/11/2020  PCP: [facility] date: 02/11/2020  Discharge date: 02/13/2020  Admitted From:Home  Disposition:  Home  Recommendations for Outpatient Follow-up:  1. Follow up with PCP in 1-2 weeks 2. Please obtain BMP/CBC in one week 3. Continue on PPI twice daily as recommended by GI until further follow-up, along with Carafate slurry for the next 5 days 4. Follow-up with GI Dr. Gala Romney in 6 weeks as recommended 5. 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 Early Saturday morning she felt ill. She had a BM with very dark stool with odd/bad odor. She stopped taking her oral iron supplement. Sunday, day of admission, she had a second very black stool and felt light-headed. For these symptoms,she present [facility] with a concern for recurrent UGI bleed.  3/29:Patient has undergone EGD with no active bleeding noted. She has a large hiatal hernia with Wanda [name] which were the likely sites of blood loss. Started on PPI BID as well as carafate. Plan to recheck labs in am and then anticipate discharge if stable and tolerating diet.  3/30: Patient has stable hemoglobin levels and is tolerating diet this morning.  She is stable for discharge with PPI twice daily as well as Carafate slurry.  Follow-up with GI as recommended in 6 weeks.  No other acute events noted throughout the course of this admission.  Discharge Diagnoses:  Active Problems:   Normocytic anemia   GERD   GI bleed   HTN (hypertension)   Acute upper  GI bleed  Principal discharge diagnosis: Acute upper GI bleed likely from [facility].  Discharge Instructions  Discharge Instructions    Diet - low sodium heart healthy   Complete by: As directed    Increase activity slowly   Complete by: As directed      Allergies as of 02/13/2020      Reactions   Sulfa Antibiotics    unknown   Naproxen Sodium Rash, Other (See Comments)   Passed out      Medication List    TAKE these medications   CALCIUM PO Take 300-600 mg by mouth daily.   cyanocobalamin 1000 MCG/ML injection Commonly known as: (VITAMIN B-12) Inject 1 mL into the muscle every 30 (thirty) days.   ferrous sulfate 325 (65 FE) MG tablet Take 1 tablet (325 mg total) by mouth daily with breakfast.   lisinopril-hydrochlorothiazide 20-12.5 MG tablet Commonly known as: ZESTORETIC Take 1 tablet by mouth daily.   multivitamin tablet Take 1 tablet by mouth daily.   pantoprazole 40 MG tablet Commonly known as: PROTONIX Take 1 tablet (40 mg total) by mouth 2 (two) times daily before a meal. What changed: when to take this   sucralfate 1 GM/10ML suspension Commonly known as: CARAFATE Take 10 mLs (1 g total) by mouth 4 (four) times daily -  with meals and at bedtime for 5 days.      Follow-up Information    [facility] Follow up in 1 week(s).   Contact information: [address] [place] [address] [phone]        Rourk,  [name], MD Follow up in 6 week(s).   Specialty: Gastroenterology Contact information: [place] [address] [phone]          Allergies  Allergen Reactions  . Sulfa Antibiotics     unknown  . Naproxen Sodium Rash and Other (See Comments)    Passed out    Consultations:  GI   Procedures/Studies:  No results found.  Discharge Exam: Vitals:   02/12/20 2139 02/13/20 0508  BP: (!) 94/53 99/69  Pulse: 81 75  Resp: 16 16  Temp: 98.8 F (37.1 C) 98 F (36.7  C)  SpO2: 100% 100%   Vitals:   02/12/20 1715 02/12/20 1800 02/12/20 2139 02/13/20 0508  BP: (!) 112/57 (!) 115/58 (!) 94/53 99/69  Pulse: 68 82 81 75  Resp: 15 16 16 16   Temp:  98.2 F (36.8 C) 98.8 F (37.1 C) 98 F (36.7 C)  TempSrc:  Oral Oral Oral  SpO2: 99% 99% 100% 100%  Weight:      Height:        General: Pt is alert, awake, not in acute distress Cardiovascular: RRR, S1/S2 +, no rubs, no gallops Respiratory: CTA bilaterally, no wheezing, no rhonchi Abdominal: Soft, NT, ND, bowel sounds + Extremities: no edema, no cyanosis    The results of significant diagnostics from this hospitalization (including imaging, microbiology, ancillary and laboratory) are listed below for reference.     Microbiology: Recent Results (from the past 240 hour(s))  SARS CORONAVIRUS 2 (TAT 6-24 HRS) Nasopharyngeal Nasopharyngeal Swab     Status: None   Collection Time: 02/11/20  5:40 PM   Specimen: Nasopharyngeal Swab  Result Value Ref Range Status   SARS Coronavirus 2 NEGATIVE NEGATIVE Final    Comment: (NOTE) SARS-CoV-2 target nucleic acids are NOT DETECTED. The SARS-CoV-2 RNA is generally detectable in upper and lower respiratory specimens during the acute phase of infection. Negative results do not preclude SARS-CoV-2 infection, do not rule out co-infections with other pathogens, and should not be used as the sole basis for treatment or other patient management decisions. Negative results must be combined with clinical observations, patient history, and epidemiological information. The expected result is Negative. Fact Sheet for Patients: SugarRoll.be Fact Sheet for Healthcare Providers: https://www.woods-mathews.com/ This test is not yet approved or cleared by the [place] FDA and  has been authorized for detection and/or diagnosis of SARS-CoV-2 by FDA under an Emergency Use Authorization (EUA). This EUA will remain  in effect  (meaning this test can be used) for the duration of the COVID-19 declaration under Section 56 4(b)(1) of the Act, 21 U.S.C. section 360bbb-3(b)(1), unless the authorization is terminated or revoked sooner. Performed at [facility], [place] [address] [place], [place] [address]      Labs: BNP (last 3 results) No results for input(s): BNP in the last 8760 hours. Basic Metabolic Panel: Recent Labs  Lab 02/11/20 1531 02/13/20 0427  NA 141 142  K 3.9 4.2  CL 107 108  CO2 25 26  GLUCOSE 90 101*  BUN 31* 21  CREATININE 0.78 0.78  CALCIUM 9.3 8.8*   Liver Function Tests: Recent Labs  Lab 02/11/20 1531  AST 26  ALT 21  ALKPHOS 71  BILITOT 1.0  PROT 6.9  ALBUMIN 4.1   No results for input(s): LIPASE, AMYLASE in the last 168 hours. No results for input(s): AMMONIA in the last 168 hours. CBC: Recent Labs  Lab 02/11/20 1531 02/12/20 0511 02/13/20 0427  WBC  8.3  --  6.7  HGB 10.4* 9.7* 9.4*  HCT 32.4* 29.9* 28.9*  MCV 98.5  --  100.0  PLT 263  --  230   Cardiac Enzymes: No results for input(s): CKTOTAL, CKMB, CKMBINDEX, TROPONINI in the last 168 hours. BNP: Invalid input(s): POCBNP CBG: No results for input(s): GLUCAP in the last 168 hours. D-Dimer No results for input(s): DDIMER in the last 72 hours. Hgb A1c No results for input(s): HGBA1C in the last 72 hours. Lipid Profile No results for input(s): CHOL, HDL, LDLCALC, TRIG, CHOLHDL, LDLDIRECT in the last 72 hours. Thyroid function studies No results for input(s): TSH, T4TOTAL, T3FREE, THYROIDAB in the last 72 hours.  Invalid input(s): FREET3 Anemia work up No results for input(s): VITAMINB12, FOLATE, FERRITIN, TIBC, IRON, RETICCTPCT in the last 72 hours. Urinalysis    Component Value Date/Time   COLORURINE YELLOW 11/05/2018 [place] 11/05/2018 1029   LABSPEC 1.008 11/05/2018 1029   PHURINE 8.0 11/05/2018 1029   GLUCOSEU NEGATIVE 11/05/2018 1029   HGBUR NEGATIVE 11/05/2018 1029    BILIRUBINUR NEGATIVE 11/05/2018 1029   KETONESUR NEGATIVE 11/05/2018 1029   PROTEINUR NEGATIVE 11/05/2018 1029   UROBILINOGEN 0.2 05/11/2012 1000   NITRITE NEGATIVE 11/05/2018 1029   LEUKOCYTESUR NEGATIVE 11/05/2018 1029   Sepsis Labs Invalid input(s): PROCALCITONIN,  WBC,  LACTICIDVEN Microbiology Recent Results (from the past 240 hour(s))  SARS CORONAVIRUS 2 (TAT 6-24 HRS) Nasopharyngeal Nasopharyngeal Swab     Status: None   Collection Time: 02/11/20  5:40 PM   Specimen: Nasopharyngeal Swab  Result Value Ref Range Status   SARS Coronavirus 2 NEGATIVE NEGATIVE Final    Comment: (NOTE) SARS-CoV-2 target nucleic acids are NOT DETECTED. The SARS-CoV-2 RNA is generally detectable in upper and lower respiratory specimens during the acute phase of infection. Negative results do not preclude SARS-CoV-2 infection, do not rule out co-infections with other pathogens, and should not be used as the sole basis for treatment or other patient management decisions. Negative results must be combined with clinical observations, patient history, and epidemiological information. The expected result is Negative. Fact Sheet for Patients: SugarRoll.be Fact Sheet for Healthcare Providers: https://www.woods-mathews.com/ This test is not yet approved or cleared by the [place] FDA and  has been authorized for detection and/or diagnosis of SARS-CoV-2 by FDA under an Emergency Use Authorization (EUA). This EUA will remain  in effect (meaning this test can be used) for the duration of the COVID-19 declaration under Section 56 4(b)(1) of the Act, 21 U.S.C. section 360bbb-3(b)(1), unless the authorization is terminated or revoked sooner. Performed at [facility], [place] [address], [place],  [address]      Time coordinating discharge: 35 minutes  SIGNED:   Rodena Goldmann, DO [facility] 02/13/2020, 8:59 AM  If 7PM-7AM, please contact  night-coverage [email or web address]

## 2020-02-13 NOTE — Progress Notes (Signed)
Discharge instructions given to patient. Verbalized understanding of new medications and of f/u appointments. No acute distress noted.

## 2020-02-13 NOTE — Discharge Instructions (Signed)
Gastrointestinal Bleeding Gastrointestinal (GI) bleeding is bleeding somewhere along the path that food travels through the body (digestive tract). This path is anywhere between the mouth and the opening of the butt (anus). You may have blood in your poop (stool) or have black poop. If you throw up (vomit), there may be blood in it. This condition can be mild, serious, or even life-threatening. If you have a lot of bleeding, you may need to stay in the hospital. What are the causes? This condition may be caused by:  Irritation and swelling of the esophagus (esophagitis). The esophagus is part of the body that moves food from your mouth to your stomach.  Swollen veins in the butt (hemorrhoids).  Areas of painful tearing in the opening of the butt (anal fissures). These are often caused by passing hard poop.  Pouches that form on the colon over time (diverticulosis).  Irritation and swelling (diverticulitis) in areas where pouches have formed on the colon.  Growths (polyps) or cancer. Colon cancer often starts out as growths that are not cancer.  Irritation of the stomach lining (gastritis).  Sores (ulcers) in the stomach. What increases the risk? You are more likely to develop this condition if you:  Have a certain type of infection in your stomach (Helicobacter pylori infection).  Take certain medicines.  Smoke.  Drink alcohol. What are the signs or symptoms? Common symptoms of this condition include:  Throwing up (vomiting) material that has bright red blood in it. It may look like coffee grounds.  Changes in your poop. The poop may: ? Have red blood in it. ? Be black, look like tar, and smell stronger than normal. ? Be red.  Pain or cramping in the belly (abdomen). How is this treated? Treatment for this condition depends on the cause of the bleeding. For example:  Sometimes, the bleeding can be stopped during a procedure that is done to find the problem (endoscopy or  colonoscopy).  Medicines can be used to: ? Help control irritation, swelling, or infection. ? Reduce acid in your stomach.  Certain problems can be treated with: ? Creams. ? Medicines that are put in the butt (suppositories). ? Warm baths.  Surgery is sometimes needed.  If you lose a lot of blood, you may need a blood transfusion. If bleeding is mild, you may be allowed to go home. If there is a lot of bleeding, you will need to stay in the hospital. Follow these instructions at home:   Take over-the-counter and prescription medicines only as told by your doctor.  Eat foods that have a lot of fiber in them. These foods include beans, whole grains, and fresh fruits and vegetables. You can also try eating 1-3 prunes each day.  Drink enough fluid to keep your pee (urine) pale yellow.  Keep all follow-up visits as told by your doctor. This is important. Contact a doctor if:  Your symptoms do not get better. Get help right away if:  Your bleeding does not stop.  You feel dizzy or you pass out (faint).  You feel weak.  You have very bad cramps in your back or belly.  You pass large clumps of blood (clots) in your poop.  Your symptoms are getting worse.  You have chest pain or fast heartbeats. Summary  GI bleeding is bleeding somewhere along the path that food travels through the body (digestive tract).  This bleeding can be caused by many things. Treatment depends on the cause of the bleeding.    Take medicines only as told by your doctor.  Keep all follow-up visits as told by your doctor. This is important. This information is not intended to replace advice given to you by your health care provider. Make sure you discuss any questions you have with your health care provider. Document Revised: 06/15/2018 Document Reviewed: 06/15/2018 Elsevier Patient Education  [place].   Upper Gastrointestinal Bleeding  Upper gastrointestinal (GI) bleeding is bleeding  from the swallowing tube (esophagus), stomach, or the first part of the small intestine (duodenum). If you have upper GI bleeding, you may vomit blood or have bloody or black stools. Bleeding can range from mild to serious or even life-threatening. If there is a lot of bleeding, you may need to stay in the hospital. What are the causes? This condition may be caused by:  Ulcer disease of the stomach (peptic ulcer) or duodenum. This is the most common cause of GI bleeding.  Inflammation, irritation, or swelling of the esophagus (esophagitis).  A tear in the esophagus.  Cancer of the esophagus, stomach, or duodenum.  An abnormal or weakened blood vessel in one of the upper GI structures.  A bleeding disorder that impairs the formation of blood clots and causes easy bleeding (coagulopathy). What increases the risk? The following factors may make you more likely to develop this condition:  Being older than 84 years of age.  Being female.  Having another long-term disease, especially liver or kidney disease.  Having a stomach infection caused by Helicobacter pylori bacteria.  Having frequent or severe vomiting.  Abusing alcohol.  Taking certain medicines for a long time, such as: ? NSAIDs. ? Anticoagulants. What are the signs or symptoms? Symptoms of this condition include:  Vomiting blood.  Black or maroon-colored stools.  Bloody stools.  Weakness or dizziness.  Heartburn.  Abdominal pain.  Difficulty swallowing.  Weight loss.  Yellow eyes or skin (jaundice).  Racing heartbeat. How is this diagnosed? This condition may be diagnosed based on:  Your symptoms and medical history.  A physical exam. During the exam, your health care provider will check for signs of blood loss, such as low blood pressure and a rapid pulse.  Tests, such as: ? Blood tests to measure your blood cell count and to check for other signs of blood loss and clotting ability. ? Blood tests  to check your liver and kidney function. ? A chest X-ray to look for a tear in the esophagus. ? Endoscopy. In this procedure, a flexible scope is put down your esophagus and into your stomach or duodenum to look for the source of bleeding. ? Angiogram. This may be done if the source of bleeding is not found during endoscopy. For an angiogram, X-rays are taken after a dye is injected into your bloodstream. ? Nasogastric tube insertion. This is a tube passed through your nose and down into your stomach. It may be connected to a source of gentle suction to see if any blood comes out. How is this treated? Treatment for this condition depends on the cause of the bleeding. Active bleeding is treated at the hospital. Treatment may include:  Getting fluids through an IV tube inserted into one of your veins.  Getting blood through an IV tube (blood transfusion).  Getting high doses of medicine through the IV to lower stomach acid. This may be done to treat ulcer disease.  Having endoscopy to treat an area of bleeding with high heat (coagulation), injections, or surgical clips.  Having a  procedure that involves first doing an angiogram and then blocking blood flow to the bleeding site (embolization).  Stopping or changing some of your regular medicines for a certain amount of time.  Having other surgical procedures if initial treatments do not control bleeding. Follow these instructions at home:  Take over-the-counter and prescription medicines only as told by your health care provider. You may need to avoid NSAIDs or other medicines that increase bleeding.  Do not drink alcohol.  Drink enough fluid to keep your urine clear or pale yellow.  Follow instructions from your health care provider about eating or drinking restrictions.  Return to your normal activities as told by your health care provider. Ask your health care provider what activities are safe for you.  Do not use any tobacco  products, such as cigarettes, chewing tobacco, and e-cigarettes. If you need help quitting, ask your health care provider.  Keep all follow-up visits as told by your health care provider. This is important. Contact a health care provider if:  You have abdominal pain or heartburn.  You have unexplained weight loss.  You have trouble swallowing.  You have frequent vomiting.  You develop jaundice.  You feel weak or dizzy.  You need help to stop smoking or drinking alcohol. Get help right away if:  You have vomiting with blood.  You have blood in your stools.  You have severe cramps in your back or abdomen.  Your symptoms of upper GI bleeding come back after treatment. This information is not intended to replace advice given to you by your health care provider. Make sure you discuss any questions you have with your health care provider. Document Revised: 10/15/2017 Document Reviewed: 03/19/2016 Elsevier Patient Education  [place].   Gastrointestinal Bleeding Gastrointestinal (GI) bleeding is bleeding somewhere along the digestive tract, between the mouth and the anus. This tract includes the mouth, esophagus, stomach, small intestine, large intestine, and anus. The large intestine is often called the colon. GI bleeding can be caused by various problems. The severity of these problems can range from mild to serious or even life-threatening. If you have GI bleeding, you may find blood in your stools (feces), you may have black stools, or you may vomit blood. If there is a lot of bleeding, you may need to stay in the hospital. What are the causes? This condition may be caused by:  Inflammation, irritation, or swelling of the esophagus (esophagitis). The esophagus is part of the body that moves food from your mouth to your stomach.  Swollen veins in the rectum (hemorrhoids).  Areas of painful tearing in the anus that are often caused by passing hard stool (anal  fissures).  Pouches that form on the colon over time, with age, and may bleed a lot (diverticulosis).  Inflammation (diverticulitis) in areas with diverticulosis. This can cause pain, fever, and bloody stools, although bleeding may be mild.  Growths (polyps) or cancer. Colon cancer often starts out as precancerous polyps.  Gastritis and ulcers. With these, bleeding may come from the upper GI tract, near the stomach. What increases the risk? You are more likely to develop this condition if you:  Have an infection in your stomach from a type of bacteria called Helicobacter pylori.  Take certain medicines, such as: ? NSAIDs. ? Aspirin. ? Selective serotonin reuptake inhibitors (SSRIs). ? Steroids. ? Antiplatelet or anticoagulant medicines.  Smoke.  Drink alcohol. What are the signs or symptoms? Common symptoms of this condition include:  Bright red  blood in your vomit, or vomit that looks like coffee grounds.  Bloody, black, or tarry stools. ? Bleeding from the lower GI tract will usually cause red or maroon blood in the stools. ? Bleeding from the upper GI tract may cause black, tarry stools that are often stronger smelling than usual. ? In certain cases, if the bleeding is fast enough, the stools may be red.  Pain or cramping in the abdomen. How is this diagnosed? This condition may be diagnosed based on:  Your medical history and a physical exam.  Various tests, such as: ? Blood tests. ? Stool tests. ? X-rays and other imaging tests. ? Esophagogastroduodenoscopy (EGD). In this test, a flexible, lighted tube is used to look at your esophagus, stomach, and small intestine. ? Colonoscopy. In this test, a flexible, lighted tube is used to look at your colon. How is this treated? Treatment for this condition depends on the cause of the bleeding. For example:  For bleeding from the esophagus, stomach, small intestine, or colon, the health care provider doing your EGD or  colonoscopy may be able to stop the bleeding as part of the procedure.  Inflammation or infection of the colon can be treated with medicines.  Certain rectal problems can be treated with creams, suppositories, or warm baths.  Medicines may be given to reduce acid in your stomach.  Surgery is sometimes needed.  Blood transfusions are sometimes needed if a lot of blood has been lost. If bleeding is mild, you may be allowed to go home. If there is a lot of bleeding, you will need to stay in the hospital for observation. Follow these instructions at home:   Take over-the-counter and prescription medicines only as told by your health care provider.  Eat foods that are high in fiber, such as beans, whole grains, and fresh fruits and vegetables. This will help to keep your stools soft. Eating 1-3 prunes each day works well for many people.  Drink enough fluid to keep your urine pale yellow.  Keep all follow-up visits as told by your health care provider. This is important. Contact a health care provider if:  Your symptoms do not improve. Get help right away if:  Your bleeding does not stop.  You feel light-headed or you faint.  You feel weak.  You have severe cramps in your back or abdomen.  You pass large blood clots in your stool.  Your symptoms are getting worse.  You have chest pain or fast heartbeats. Summary  Gastrointestinal (GI) bleeding is bleeding somewhere along the digestive tract, between the mouth and anus. GI bleeding can be caused by various problems. The severity of these problems can range from mild to serious or even life-threatening.  Treatment for this condition depends on the cause of the bleeding.  Take over-the-counter and prescription medicines only as told by your health care provider.  Keep all follow-up visits as told by your health care provider. This is important.  Get help right away if your bleeding increases, your symptoms are getting worse,  or you have new symptoms. This information is not intended to replace advice given to you by your health care provider. Make sure you discuss any questions you have with your health care provider. Document Revised: 06/15/2018 Document Reviewed: 06/15/2018 Elsevier Patient Education  [place] Gastrointestinal Bleeding  Lower gastrointestinal (GI) bleeding is the result of bleeding from the colon, rectum, or anal area. The colon is the last part of  the digestive tract, where stool, also called feces, is formed. If you have lower GI bleeding, you may see blood in or on your stool. It may be bright red. Lower GI bleeding often stops without treatment. Continued or heavy bleeding needs emergency treatment at the hospital. What are the causes? Lower GI bleeding may be caused by:  A condition that causes pouches to form in the colon over time (diverticulosis).  Swelling and irritation (inflammation) in areas with diverticulosis (diverticulitis).  Inflammation of the colon (inflammatory bowel disease).  Swollen veins in the rectum (hemorrhoids).  Painful tears in the anus (anal fissures), often caused by passing hard stools.  Cancer of the colon or rectum.  Noncancerous growths (polyps) of the colon or rectum.  A bleeding disorder that impairs the formation of blood clots and causes easy bleeding (coagulopathy).  An abnormal weakening of a blood vessel where an artery and a vein come together (arteriovenous malformation). What increases the risk? You are more likely to develop this condition if:  You are older than 84 years of age.  You take aspirin or NSAIDs on a regular basis.  You take anticoagulant or antiplatelet drugs.  You have a history of high-dose X-ray treatment (radiation therapy) of the colon.  You recently had a colon polyp removed. What are the signs or symptoms? Symptoms of this condition include:  Bright red blood or blood clots coming from  your rectum.  Bloody stools.  Black or maroon-colored stools.  Pain or cramping in the abdomen.  Weakness or dizziness.  Racing heartbeat. How is this diagnosed? This condition may be diagnosed based on:  Your symptoms and medical history.  A physical exam. During the exam, your health care provider will check for signs of blood loss, such as low blood pressure and a rapid pulse.  Tests, such as: ? Flexible sigmoidoscopy. In this procedure, a flexible tube with a camera on the end is used to examine your anus and the first part of your colon to look for the source of bleeding. ? Colonoscopy. This is similar to a flexible sigmoidoscopy, but the camera can extend all the way to the uppermost part of your colon. ? Blood tests to measure your red blood cell count and to check for coagulopathy. ? An imaging study of your colon to look for a bleeding site. In some cases, you may have X-rays taken after a dye or radioactive substance is injected into your bloodstream (angiogram). How is this treated? Treatment for this condition depends on the cause of the bleeding. Heavy or persistent bleeding is treated at the hospital. Treatment may include:  Getting fluids through an IV tube inserted into one of your veins.  Getting blood through an IV tube (blood transfusion).  Stopping bleeding through high-heat coagulation, injections of certain medicines, or applying surgical clips. This can all be done during a colonoscopy.  Having a procedure that involves first doing an angiogram and then blocking blood flow to the bleeding site (embolization).  Stopping some of your regular medicines for a certain amount of time.  Having surgery to remove part of the colon. This may be needed if bleeding is severe and does not respond to other treatment. Follow these instructions at home:  Take over-the-counter and prescription medicines only as told by your health care provider. You may need to avoid  aspirin, NSAIDs, or other medicines that increase bleeding.  Eat foods that are high in fiber. This will help keep your stools soft. These foods  include whole grains, legumes, fruits, and vegetables. Eating 1-3 prunes each day works well for many people.  Drink enough fluid to keep your urine clear or pale yellow.  Keep all follow-up visits as told by your health care provider. This is important. Contact a health care provider if:  Your symptoms do not improve. Get help right away if:  Your bleeding increases.  You feel light-headed or you faint.  You feel weak.  You have severe cramps in your back or abdomen.  You pass large blood clots in your stool.  Your symptoms get worse. This information is not intended to replace advice given to you by your health care provider. Make sure you discuss any questions you have with your health care provider. Document Revised: 02/24/2019 Document Reviewed: 03/19/2016 Elsevier Patient Education  [place].

## 2020-02-13 NOTE — Telephone Encounter (Signed)
Spoke with the pharmacy and pts medication was picked up today. The cost of Carafate susp. Is $103.00. I spoke with pt and pt is aware that the Carafate capsule can be dissolved in water. The cost of the capsule will be more affordable for pt. Pts discharge sheet states that pt will take Carafate 1gm 3 times daily and at bedtime for 5 days. Is pt suppose to only take this medication for 5 days? If so, another RX doesn't need to be sent in. Pt is aware that RMR isn't in the office this week and will continue med until we get back with her.

## 2020-02-13 NOTE — Plan of Care (Signed)

## 2020-02-14 ENCOUNTER — Encounter: Payer: Self-pay | Admitting: Internal Medicine

## 2020-02-14 ENCOUNTER — Other Ambulatory Visit: Payer: Self-pay

## 2020-02-14 LAB — SURGICAL PATHOLOGY

## 2020-02-14 NOTE — Telephone Encounter (Signed)
5 days only; ok to dissolve tablet in water.

## 2020-02-15 NOTE — Telephone Encounter (Signed)
Pt returned call and will take med for 5 days as directed by RMR.

## 2020-02-15 NOTE — Telephone Encounter (Signed)
Lmom, waiting on a return calling.

## 2020-02-29 ENCOUNTER — Encounter: Payer: Self-pay | Admitting: Gastroenterology

## 2020-04-02 ENCOUNTER — Ambulatory Visit (INDEPENDENT_AMBULATORY_CARE_PROVIDER_SITE_OTHER): Payer: Medicare Other | Admitting: Gastroenterology

## 2020-04-02 ENCOUNTER — Telehealth: Payer: Self-pay

## 2020-04-02 ENCOUNTER — Encounter: Payer: Self-pay | Admitting: Gastroenterology

## 2020-04-02 ENCOUNTER — Other Ambulatory Visit: Payer: Self-pay

## 2020-04-02 VITALS — BP 152/76 | HR 76 | Temp 97.3°F | Ht 60.0 in | Wt 117.6 lb

## 2020-04-02 DIAGNOSIS — D500 Iron deficiency anemia secondary to blood loss (chronic): Secondary | ICD-10-CM

## 2020-04-02 NOTE — Patient Instructions (Signed)
Let's increase iron to twice a day. Let me know if any issues with constipation.  Continue Protonix twice a day, 30 minutes before breakfast and dinner indefinitely.  We will follow your blood count/iron and repeat in 6 months. Keep appointment upcoming with Hematology as already planned.   We will see you in 1 year or sooner if needed!  Happy early Rudene Anda!   I enjoyed seeing you again today! As you know, I value our relationship and want to provide genuine, compassionate, and quality care. I welcome your feedback. If you receive a survey regarding your visit,  I greatly appreciate you taking time to fill this out. See you next time!  Annitta Needs, PhD, ANP-BC [facility]

## 2020-04-02 NOTE — Telephone Encounter (Signed)
Was seen in office today. Pt forgot to mention that her PCP is Angelina Ok, MD. Pt's previous PCP isn't in that group.

## 2020-04-02 NOTE — Progress Notes (Signed)
Referring Provider: Sander Radon, NP Primary Care Physician:  [facility]: Dr. Gala Romney    Chief Complaint  Patient presents with  . Anemia    doing ok    HPI:   Wanda [name] is a 86 y.o. female presenting today with history of IDA,  presenting in follow-up from hospital admission for melena, heme positive stool, s/p EGD inpatient with normal esophagus, large hiatal hernia, [name] lesions, benign gastric biopsy. Increased PPI to BID.   Outside LFTs from April 2021: Tbili 2.3, alk phos 90, AST 10, ALT 13. Hgb 11.4, Hct 34.6, platelets 291.   Still feels fatigued. On supplemental Vit D. No overt GI bleeding. No abdominal pain. Hematology appointment upcoming. She would like to limit visits if at all possible. No significant improvement with iron infusions in the past.   Past Medical History:  Diagnosis Date  . Anemia   . Cystocele   . Diverticulosis 10/20/10   Left-sided on colonoscopy by Dr. Gala Romney 10/20/10  . Gastric polyps 10/02/2013  . Gastritis 12/08/10   EGD by Dr. Altha Harm hiatal hernia, duodenal diverticulum, chronic gastritis  . GERD (gastroesophageal reflux disease)   . Heart murmur   . Hiatal hernia 10/02/2013  . HTN (hypertension)   . IDA (iron deficiency anemia)   . 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 Osteoarthritis   . Radial fracture    (right) Undergoing treatment by Dr. Aline Brochure currently  . Sleep apnea    STOP BANG SCORE 4  . Urge incontinence   . Vitamin D deficiency     Past Surgical History:  Procedure Laterality Date  . ABDOMINAL HYSTERECTOMY    . ANTERIOR AND POSTERIOR REPAIR  05/17/2012   Procedure: ANTERIOR (CYSTOCELE) AND POSTERIOR REPAIR (RECTOCELE);  Surgeon: Jonnie Kind, MD;  Location: [facility];  Service: Gynecology;  Laterality: N/A;  . BIOPSY  11/22/2018   Procedure: BIOPSY;  Surgeon: Daneil Dolin, MD;  [facility];  Service: Endoscopy;;  . BIOPSY  02/12/2020   Procedure: BIOPSY;  Surgeon: Daneil Dolin, MD;  [facility];  Service: Endoscopy;;  gastric biopsy  . COLONOSCOPY  02/29/2012   Dr. Gala Romney: colonic diverticulosis, minimal internal hemorrhoids, benign polyp  . ESOPHAGOGASTRODUODENOSCOPY  02/29/2012   Dr. Gala Romney: atonic esophagus, moderate-sized hiatal hernia, fundal gland polyps  . ESOPHAGOGASTRODUODENOSCOPY N/A 10/02/2013   Dr. Gala Romney- multiple Wilhemena Durie gastric polyps- not manipulated  . ESOPHAGOGASTRODUODENOSCOPY N/A 01/14/2015   Dr. Gala Romney: Large hiatal hernia, otherwise normal EGD  . ESOPHAGOGASTRODUODENOSCOPY (EGD) WITH PROPOFOL N/A 11/22/2018   Dr. Gala Romney: normal esophagus, large hiatal hernia, erosive gastropathy s/p biopsy, normal duodenum. Reactive gastropathy  . ESOPHAGOGASTRODUODENOSCOPY (EGD) WITH PROPOFOL N/A 02/12/2020   normal esophagus, large hiatal hernia, [name] lesions, benign gastric biopsy  . GIVENS CAPSULE STUDY  02/29/2012   No explanation for IDA. Possible extrinsic compression but negative CT.   Marland Kitchen SALPINGOOPHORECTOMY  05/17/2012   Procedure: SALPINGO OOPHERECTOMY;  Surgeon: Jonnie Kind, MD;  Location: [facility];  Service: Gynecology;  Laterality: Bilateral;  . VAGINAL HYSTERECTOMY  05/17/2012   Procedure: HYSTERECTOMY VAGINAL;  Surgeon: Jonnie Kind, MD;  Location: [facility];  Service: Gynecology;  Laterality: N/A;    Current Outpatient Medications  Medication Sig Dispense Refill  . CALCIUM PO Take 300-600 mg by mouth daily.     . cyanocobalamin (,VITAMIN B-12,) 1000 MCG/ML injection Inject 1 mL into the muscle every 30 (thirty) days.    Marland Kitchen  ferrous sulfate 325 (65 FE) MG tablet Take 1 tablet (325 mg total) by mouth daily with breakfast. 30 tablet 3  . lisinopril-hydrochlorothiazide (PRINZIDE,ZESTORETIC) 20-12.5 MG per tablet Take 1 tablet by mouth daily.    . Multiple Vitamin (MULTIVITAMIN) tablet Take 1 tablet by mouth daily.     . pantoprazole (PROTONIX) 40 MG tablet Take 1 tablet (40 mg total) by  mouth 2 (two) times daily before a meal. 60 tablet 2   No current facility-administered medications for this visit.    Allergies as of 04/02/2020 - Review Complete 04/02/2020  Allergen Reaction Noted  . Sulfa antibiotics  06/23/2016  . Naproxen sodium Rash and Other (See Comments)     Family History  Problem Relation Age of Onset  . Hypertension Mother   . Lymphoma Brother   . Anesthesia problems Neg Hx   . Hypotension Neg Hx   . Malignant hyperthermia Neg Hx   . Pseudochol deficiency Neg Hx   . Colon cancer Neg Hx     Social History   Socioeconomic History  . Marital status: Widowed    Spouse name: Not on file  . Number of children: Not on file  . Years of education: Not on file  . Highest education level: Not on file  Occupational History  . Occupation: Retired    Fish farm manager: RETIRED  Tobacco Use  . Smoking status: Never Smoker  . Smokeless tobacco: Never Used  . Tobacco comment: Never smoked  Substance and Sexual Activity  . Alcohol use: No    Alcohol/week: 0.0 standard drinks  . Drug use: No  . Sexual activity: Not Currently    Birth control/protection: Surgical  Other Topics Concern  . Not on file  Social History Narrative  . Not on file   Social Determinants of Health   Financial Resource Strain:   . Difficulty of Paying Living Expenses:   Food Insecurity:   . Worried About Charity fundraiser in the Last Year:   . Arboriculturist in the Last Year:   Transportation Needs:   . Film/video editor (Medical):   Marland Kitchen Lack of Transportation (Non-Medical):   Physical Activity:   . Days of Exercise per Week:   . Minutes of Exercise per Session:   Stress:   . Feeling of Stress :   Social Connections:   . Frequency of Communication with Friends and Family:   . Frequency of Social Gatherings with Friends and Family:   . Attends Religious Services:   . Active Member of Clubs or Organizations:   . Attends Archivist Meetings:   Marland Kitchen Marital Status:      Review of Systems: Gen: Denies fever, chills, anorexia. Denies fatigue, weakness, weight loss.  CV: Denies chest pain, palpitations, syncope, peripheral edema, and claudication. Resp: Denies dyspnea at rest, cough, wheezing, coughing up blood, and pleurisy. GI: see HPI Derm: Denies rash, itching, dry skin Psych: Denies depression, anxiety, memory loss, confusion. No homicidal or suicidal ideation.  Heme: Denies bruising, bleeding, and enlarged lymph nodes.  Physical Exam: BP (!) 152/76   Pulse 76   Temp (!) 97.3 F (36.3 C) (Temporal)   Ht 5' (1.524 m)   Wt 117 lb 9.6 oz (53.3 kg)   BMI 22.97 kg/m  General:   Alert and oriented. No distress noted. Pleasant and cooperative.  Head:  Normocephalic and atraumatic. Eyes:  Conjuctiva clear without scleral icterus. Mouth:  Mask in place Abdomen:  +BS, soft, non-tender and non-distended.  No rebound or guarding. No HSM or masses noted. Msk:  With kyphosis  Extremities:  Without edema. Neurologic:  Alert and  oriented x4 Psych:  Alert and cooperative. Normal mood and affect.  ASSESSMENT: Wanda [name] is a delightful 86 y.o. female presenting today with history of IDA, recently inpatient for UGI bleed s/p EGD with Lysbeth Galas lesions. Hgb has improved since discharge. Continues to follow with Hematology although she would like to limit these visits going forward. I have asked her to discuss this with Hematology. I offered to serially follow CBC, iron studies if this would be easier for her in grouping her care. She will continue PPI twice a day indefinitely.   Mildly elevated bilirubin: I note she has had fluctuations in this and query Gilbert's. I do not have a recent HFP with fractionated bilirubin. She has upcoming labs with Hematology, and I will follow-up with those as come available.    PLAN:   Keep upcoming Hematology appt  Keep lab appt upcoming  CBC, iron studies in 6 months  Increase iron to BID  PPI BID  indefinitely  Return in 1 year or sooner if needed  Annitta Needs, PhD, ANP-BC [facility]

## 2020-04-02 NOTE — Progress Notes (Signed)
CC'ED TO PCP 

## 2020-04-03 NOTE — Telephone Encounter (Signed)
Noted  

## 2020-04-03 NOTE — Telephone Encounter (Signed)
Changed patient pcp in epic

## 2020-04-30 ENCOUNTER — Telehealth: Payer: Self-pay | Admitting: Internal Medicine

## 2020-04-30 NOTE — Telephone Encounter (Signed)
Lmom, waiting on a return call.  

## 2020-04-30 NOTE — Telephone Encounter (Signed)
Pt wanted to know if AB would order blood work for her where she could have it done in [place] since it's too hard for her to get to [place]. Please advise. [phone]

## 2020-05-01 NOTE — Telephone Encounter (Signed)
Lmom, waiting on a return call.  

## 2020-05-02 ENCOUNTER — Other Ambulatory Visit (HOSPITAL_COMMUNITY): Payer: Medicare Other

## 2020-05-06 ENCOUNTER — Telehealth: Payer: Self-pay | Admitting: Internal Medicine

## 2020-05-06 NOTE — Telephone Encounter (Signed)
Pt called back to say that she needed her lab orders to take with her to her PCP to have her labs done there.

## 2020-05-06 NOTE — Telephone Encounter (Signed)
Pt was seen on 04/02/20. Pt was notified at that apt that she would have CBC & Iron studies in 6 months. Pt was to see her hematologist as pt was referred by our office. Pt cancelled her apt for the hematologist and the blood work she was asked to get by her hematologist. Pt said she lives 25 miles away and didn't feel she could get to the hematologist office. Pt is requesting that AB write orders for pt to have labs completed at [facility]. Pt is aware that AB didn't order the hematology labs and says her PCP said we can send orders over for pt since they aren't comfortable writing orders for pt. Pt was asked to contact hematology and pt doesn't want to contact them and says she won't be able to drive there. Our previous orders for 6 months looks like they have been deleted. Please advise  labs and orders will be added for pt.

## 2020-05-06 NOTE — Telephone Encounter (Signed)
Pt returning call. [phone]

## 2020-05-07 ENCOUNTER — Other Ambulatory Visit: Payer: Self-pay

## 2020-05-07 DIAGNOSIS — D509 Iron deficiency anemia, unspecified: Secondary | ICD-10-CM

## 2020-05-07 NOTE — Telephone Encounter (Signed)
Yes, we can order CBC and iron studies.

## 2020-05-07 NOTE — Telephone Encounter (Signed)
Noted. Lab orders placed and mailed to pt. Pt is aware and will complete labs at her PCP office.

## 2020-05-09 ENCOUNTER — Ambulatory Visit (HOSPITAL_COMMUNITY): Payer: Medicare Other | Admitting: Nurse Practitioner

## 2020-05-14 LAB — CBC WITH DIFFERENTIAL/PLATELET
Absolute Monocytes: 421 cells/uL (ref 200–950)
Basophils Absolute: 69 cells/uL (ref 0–200)
Basophils Relative: 1 %
Eosinophils Absolute: 131 cells/uL (ref 15–500)
Eosinophils Relative: 1.9 %
HCT: 35.8 % (ref 35.0–45.0)
Hemoglobin: 12 g/dL (ref 11.7–15.5)
Lymphs Abs: 2174 cells/uL (ref 850–3900)
MCH: 32.4 pg (ref 27.0–33.0)
MCHC: 33.5 g/dL (ref 32.0–36.0)
MCV: 96.8 fL (ref 80.0–100.0)
MPV: 10.7 fL (ref 7.5–12.5)
Monocytes Relative: 6.1 %
Neutro Abs: 4106 cells/uL (ref 1500–7800)
Neutrophils Relative %: 59.5 %
Platelets: 308 10*3/uL (ref 140–400)
RBC: 3.7 10*6/uL — ABNORMAL LOW (ref 3.80–5.10)
RDW: 11.1 % (ref 11.0–15.0)
Total Lymphocyte: 31.5 %
WBC: 6.9 10*3/uL (ref 3.8–10.8)

## 2020-05-14 LAB — IRON,TIBC AND FERRITIN PANEL
%SAT: 63 % (calc) — ABNORMAL HIGH (ref 16–45)
Ferritin: 300 ng/mL — ABNORMAL HIGH (ref 16–288)
Iron: 175 ug/dL — ABNORMAL HIGH (ref 45–160)
TIBC: 278 mcg/dL (calc) (ref 250–450)

## 2020-05-27 ENCOUNTER — Other Ambulatory Visit: Payer: Self-pay

## 2020-05-27 DIAGNOSIS — R7989 Other specified abnormal findings of blood chemistry: Secondary | ICD-10-CM

## 2020-05-28 ENCOUNTER — Other Ambulatory Visit: Payer: Self-pay | Admitting: Nurse Practitioner

## 2020-05-28 DIAGNOSIS — K922 Gastrointestinal hemorrhage, unspecified: Secondary | ICD-10-CM

## 2020-05-28 DIAGNOSIS — K921 Melena: Secondary | ICD-10-CM

## 2020-05-28 DIAGNOSIS — D509 Iron deficiency anemia, unspecified: Secondary | ICD-10-CM

## 2020-06-08 IMAGING — DX DG WRIST COMPLETE 3+V*L*
4 series · 4 of 4 positions shown · non-contrast
Comparison: None.

CLINICAL DATA: Fell, tripped over dog.  Pain.

EXAM:
LEFT WRIST - COMPLETE 3+ VIEW

[wrist pa]
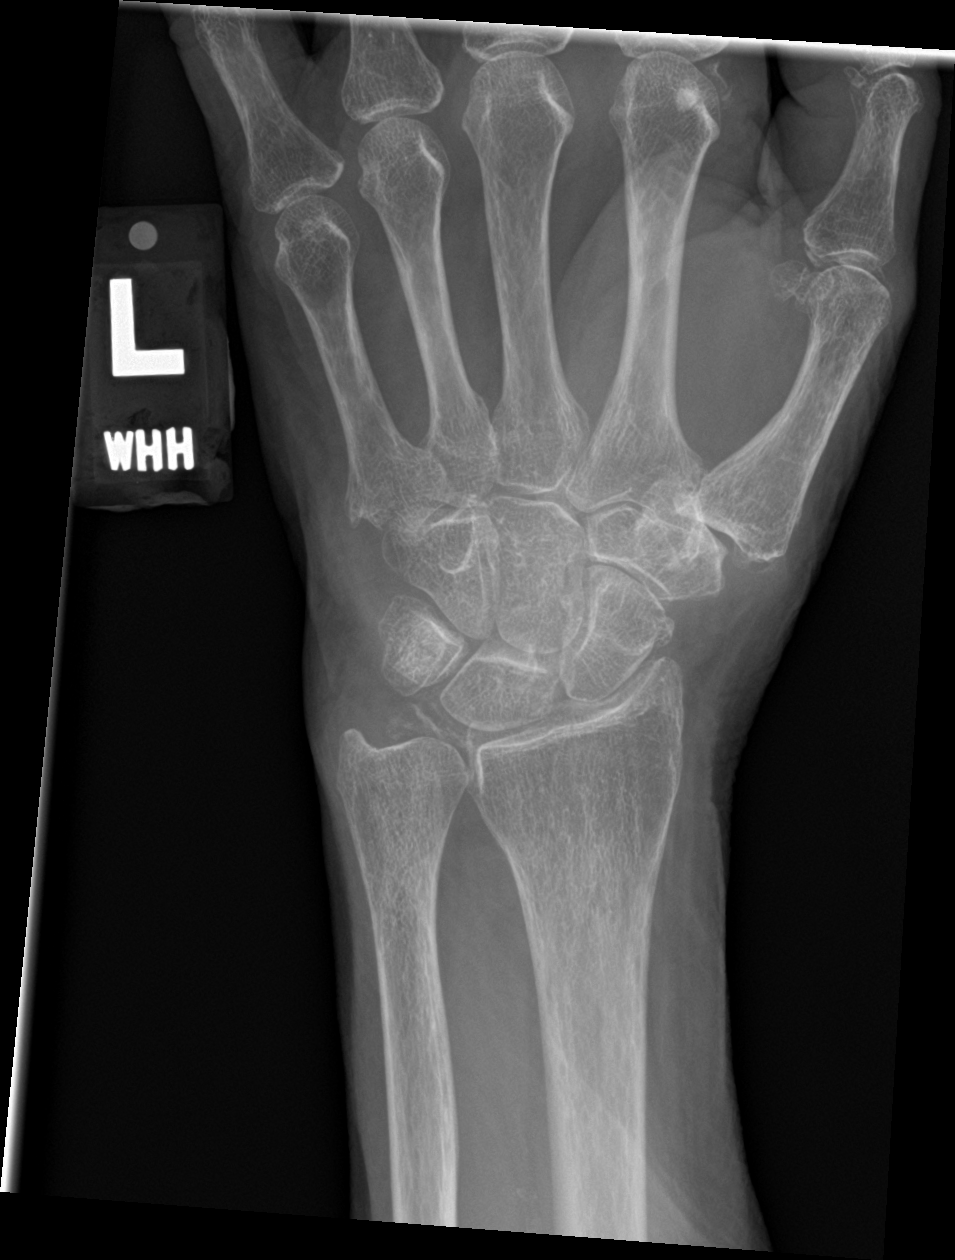

[wrist obl]
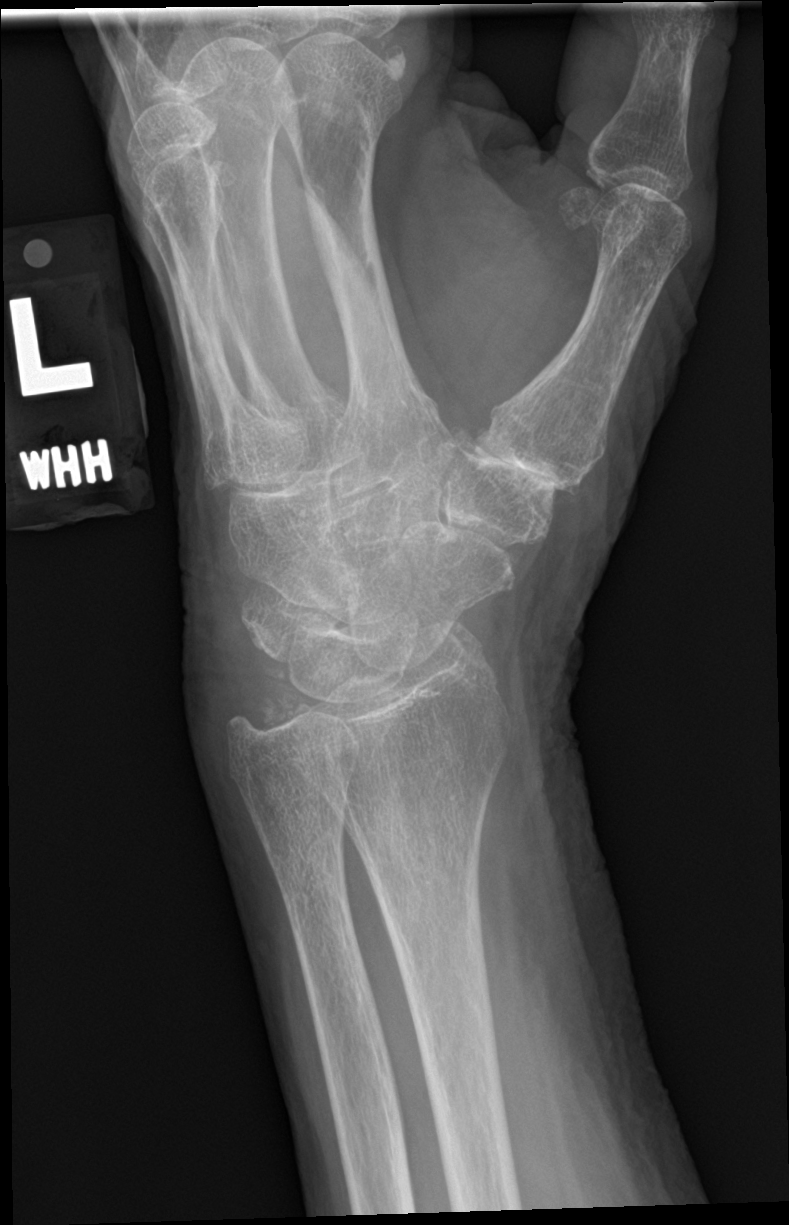

[wrist lat]
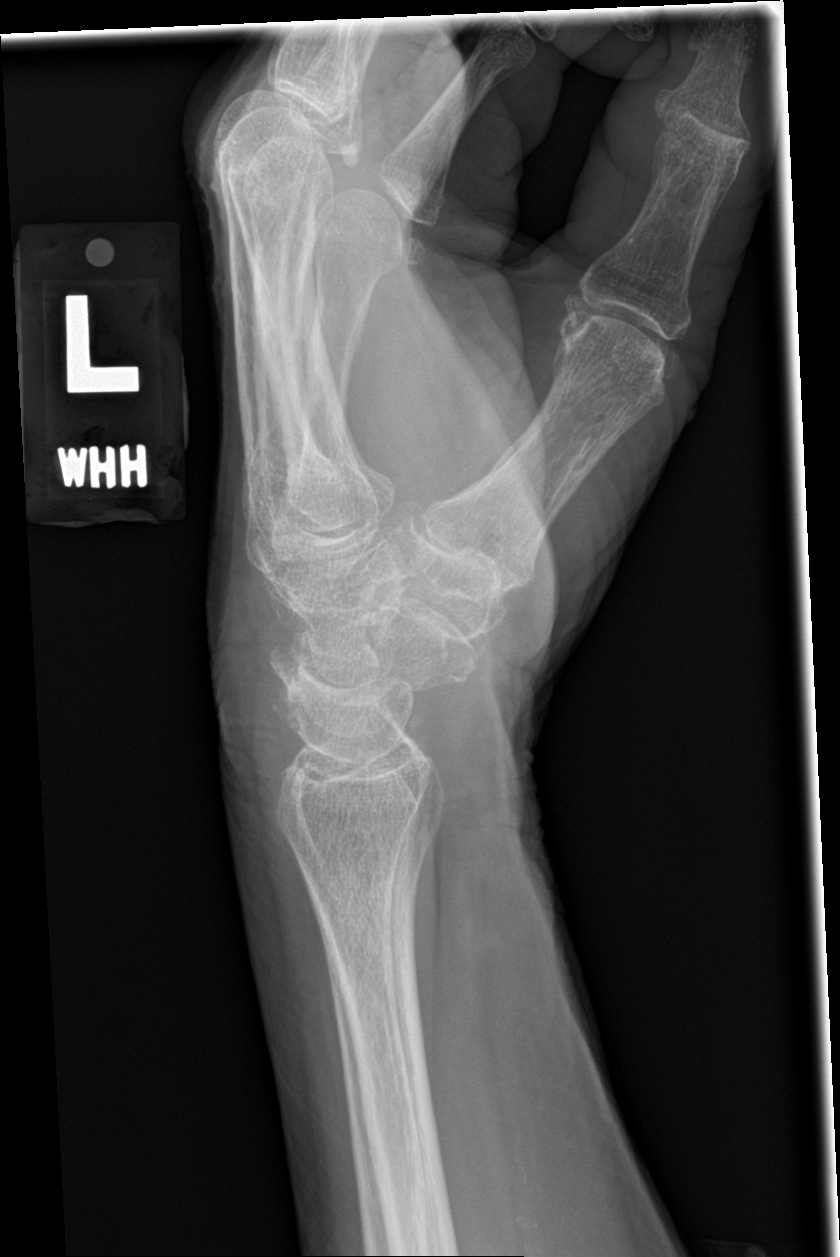

[wrist navicular]
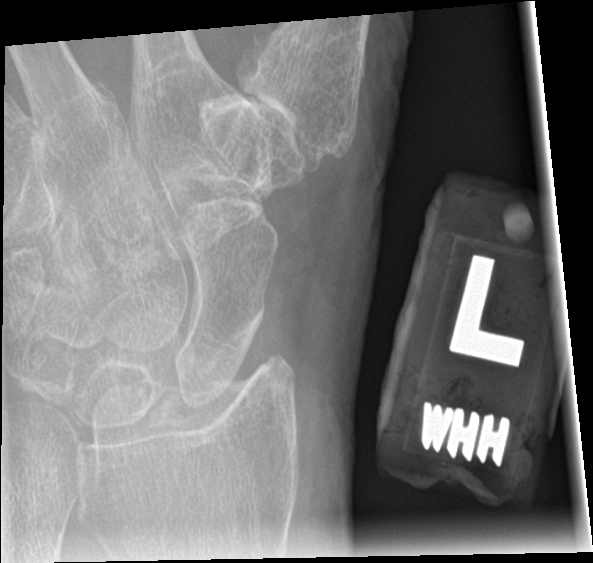

[4 of 4 positions shown; findings below may reference images not displayed]

FINDINGS: No acute fracture deformity or dislocation. Moderate first MTP joint
space narrowing with periarticular sclerosis and marginal spurring
compatible with osteoarthrosis. Fluffy intra-articular
calcifications consistent with CPPD. Base of fifth metacarpus
exostosis. Osteopenia without destructive bony lesions. Mild wrist
soft tissue swelling without subcutaneous gas or radiopaque foreign
bodies.
IMPRESSION: 1. No acute fracture deformity or dislocation. Mild soft tissue
swelling.
2. Moderate first MTP osteoarthrosis.

## 2020-06-28 ENCOUNTER — Other Ambulatory Visit: Payer: Self-pay

## 2020-06-28 ENCOUNTER — Emergency Department (HOSPITAL_COMMUNITY)
Admission: EM | Admit: 2020-06-28 | Discharge: 2020-06-28 | Disposition: A | Discharge status: Home / Self Care | Payer: Medicare Other | Attending: Emergency Medicine | Admitting: Emergency Medicine

## 2020-06-28 ENCOUNTER — Telehealth: Payer: Self-pay | Admitting: Internal Medicine

## 2020-06-28 ENCOUNTER — Encounter (HOSPITAL_COMMUNITY): Payer: Self-pay

## 2020-06-28 DIAGNOSIS — I10 Essential (primary) hypertension: Secondary | ICD-10-CM | POA: Diagnosis not present

## 2020-06-28 DIAGNOSIS — Z20822 Contact with and (suspected) exposure to covid-19: Secondary | ICD-10-CM | POA: Diagnosis not present

## 2020-06-28 DIAGNOSIS — K921 Melena: Secondary | ICD-10-CM | POA: Diagnosis present

## 2020-06-28 DIAGNOSIS — R109 Unspecified abdominal pain: Secondary | ICD-10-CM | POA: Insufficient documentation

## 2020-06-28 DIAGNOSIS — K219 Gastro-esophageal reflux disease without esophagitis: Secondary | ICD-10-CM | POA: Diagnosis not present

## 2020-06-28 DIAGNOSIS — K922 Gastrointestinal hemorrhage, unspecified: Secondary | ICD-10-CM | POA: Diagnosis not present

## 2020-06-28 LAB — SARS CORONAVIRUS 2 BY RT PCR (HOSPITAL ORDER, PERFORMED IN ~~LOC~~ HOSPITAL LAB): SARS Coronavirus 2: NEGATIVE

## 2020-06-28 LAB — COMPREHENSIVE METABOLIC PANEL
ALT: 19 U/L (ref 0–44)
AST: 23 U/L (ref 15–41)
Albumin: 4.1 g/dL (ref 3.5–5.0)
Alkaline Phosphatase: 81 U/L (ref 38–126)
Anion gap: 10 (ref 5–15)
BUN: 44 mg/dL — ABNORMAL HIGH (ref 8–23)
CO2: 22 mmol/L (ref 22–32)
Calcium: 9 mg/dL (ref 8.9–10.3)
Chloride: 103 mmol/L (ref 98–111)
Creatinine, Ser: 0.83 mg/dL (ref 0.44–1.00)
GFR calc Af Amer: >60 mL/min (ref >60)
GFR calc non Af Amer: >60 mL/min (ref >60)
Glucose, Bld: 111 mg/dL — ABNORMAL HIGH (ref 70–99)
Potassium: 4.5 mmol/L (ref 3.5–5.1)
Sodium: 135 mmol/L (ref 135–145)
Total Bilirubin: 1.1 mg/dL (ref 0.3–1.2)
Total Protein: 7.1 g/dL (ref 6.5–8.1)

## 2020-06-28 LAB — CBC WITH DIFFERENTIAL/PLATELET
Abs Immature Granulocytes: 0.06 10*3/uL (ref 0.00–0.07)
Basophils Absolute: 0.1 10*3/uL (ref 0.0–0.1)
Basophils Relative: 1 %
Eosinophils Absolute: 0.1 10*3/uL (ref 0.0–0.5)
Eosinophils Relative: 1 %
HCT: 34.2 % — ABNORMAL LOW (ref 36.0–46.0)
Hemoglobin: 11.3 g/dL — ABNORMAL LOW (ref 12.0–15.0)
Immature Granulocytes: 1 %
Lymphocytes Relative: 17 %
Lymphs Abs: 2 10*3/uL (ref 0.7–4.0)
MCH: 33.6 pg (ref 26.0–34.0)
MCHC: 33 g/dL (ref 30.0–36.0)
MCV: 101.8 fL — ABNORMAL HIGH (ref 80.0–100.0)
Monocytes Absolute: 0.6 10*3/uL (ref 0.1–1.0)
Monocytes Relative: 6 %
Neutro Abs: 8.4 10*3/uL — ABNORMAL HIGH (ref 1.7–7.7)
Neutrophils Relative %: 74 %
Platelets: 322 10*3/uL (ref 150–400)
RBC: 3.36 MIL/uL — ABNORMAL LOW (ref 3.87–5.11)
RDW: 12.3 % (ref 11.5–15.5)
WBC: 11.2 10*3/uL — ABNORMAL HIGH (ref 4.0–10.5)
nRBC: 0 % (ref 0.0–0.2)

## 2020-06-28 LAB — TYPE AND SCREEN
ABO/RH(D): O POS
Antibody Screen: NEGATIVE

## 2020-06-28 LAB — POC OCCULT BLOOD, ED: Fecal Occult Bld: POSITIVE — AB

## 2020-06-28 MED ORDER — SUCRALFATE 1 G PO TABS
1.0000 g | ORAL_TABLET | Freq: Three times a day (TID) | ORAL | 1 refills | Status: DC
Start: 2020-06-28 — End: 2020-12-04

## 2020-06-28 MED ORDER — PANTOPRAZOLE SODIUM 40 MG IV SOLR
40.0000 mg | Freq: Once | INTRAVENOUS | Status: AC
  Administered 2020-06-28: 40 mg via INTRAVENOUS
  Filled 2020-06-28: qty 40

## 2020-06-28 MED ORDER — SUCRALFATE 1 GM/10ML PO SUSP
1.0000 g | Freq: Two times a day (BID) | ORAL | 1 refills | Status: DC
Start: 2020-06-28 — End: 2020-06-28

## 2020-06-28 MED ORDER — SUCRALFATE 1 G PO TABS
1.0000 g | ORAL_TABLET | Freq: Once | ORAL | Status: AC
  Administered 2020-06-28: 1 g via ORAL
  Filled 2020-06-28: qty 1

## 2020-06-28 MED ORDER — SODIUM CHLORIDE 0.9 % IV BOLUS
500.0000 mL | Freq: Once | INTRAVENOUS | Status: AC
  Administered 2020-06-28: 500 mL via INTRAVENOUS

## 2020-06-28 NOTE — Telephone Encounter (Signed)
Left a detailed message for pt. Waiting on a return call. Pt was advised via VM, if pain worsen, go to the ED. Carafate 1 g was sent to pts pharmacy. Waiting to discuss this with pt and order labs.

## 2020-06-28 NOTE — Telephone Encounter (Signed)
Dr. Roderic Palau in the ED called me about this lady.  States she came in after having a few more than usual dark stools overnight.  History of [name] lesions per record.  Aching nothing would make her bleed.  On Protonix 40 mg twice daily.  Found to be quite stable in the ED hemoglobin 11.3 down a little from 12 BUN mildly elevated. Entirely stable.  I recommended adding Carafate 1 g slurry's before meals and at bedtime.  We will arrange for an H&H to be checked the first of the week (8/16); we will plan to get her in for an office visit next week.  If symptoms worsen she can come back to the ED.

## 2020-06-28 NOTE — Discharge Instructions (Addendum)
Take the Carafate 4 times a day but make sure you break up the pill and mix it with some water to make a slurry out of it. Take it before meals and at bedtime. Return this weekend if any problems. Dr. Sydell Axon will make arrangements for you to get blood tests began the week and make arrangements for your follow-up

## 2020-06-28 NOTE — ED Notes (Signed)
Pt verbalized discharge instructions.

## 2020-06-28 NOTE — ED Triage Notes (Signed)
Pt reports black stools several days ago. Noticed stools were darker than normal. Pt takes iron and didn't take iron pill for a day and noticed stool was black last night . Abdominal pain last night which has resolved

## 2020-06-28 NOTE — ED Provider Notes (Signed)
[facility] EMERGENCY DEPARTMENT Provider Note   CSN: [id number] Arrival date & time: 06/28/20  0734     History Chief Complaint  Patient presents with  . Melena    Wanda [name] is a 86 y.o. female.  Patient complains of black stool for the last couple days.  She has a history of gastric ulcers.  No vomiting.  The history is provided by the patient and medical records. No language interpreter was used.  Abdominal Pain Pain location:  Epigastric Pain quality: aching   Pain radiates to:  Does not radiate Pain severity:  Mild Onset quality:  Unable to specify Timing:  Intermittent Progression:  Improving Chronicity:  New Context: not alcohol use   Relieved by:  Nothing Worsened by:  Nothing Associated symptoms: no chest pain, no cough, no diarrhea, no fatigue and no hematuria        Past Medical History:  Diagnosis Date  . Anemia   . Cystocele   . Diverticulosis 10/20/10   Left-sided on colonoscopy by Dr. Gala Romney 10/20/10  . Gastric polyps 10/02/2013  . Gastritis 12/08/10   EGD by Dr. Altha Harm hiatal hernia, duodenal diverticulum, chronic gastritis  . GERD (gastroesophageal reflux disease)   . Heart murmur   . Hiatal hernia 10/02/2013  . HTN (hypertension)   . IDA (iron deficiency anemia)   . 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 Osteoarthritis   . Radial fracture    (right) Undergoing treatment by Dr. Aline Brochure currently  . Sleep apnea    STOP BANG SCORE 4  . Urge incontinence   . Vitamin D deficiency     Patient Active Problem List   Diagnosis Date Noted  . Acute upper GI bleed 02/11/2020  . Loss of weight 06/28/2019  . Sleep apnea 11/20/2018  . Hypokalemia 11/20/2018  . Leukocytosis 11/20/2018  . Symptomatic anemia 11/04/2018  . Diarrhea, functional 03/15/2015  . Fatigue 12/28/2014  . Upper abdominal pain 12/28/2014  . B12 deficiency 12/28/2014    Class: History of  . Acute blood loss anemia 10/02/2013  . GI bleed 10/02/2013  . Melena  10/02/2013  . Hematemesis 10/02/2013  . Sinus tachycardia 10/02/2013  . 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 Hiatal hernia 10/02/2013  . Gastric polyps 10/02/2013  . HTN (hypertension)   . IDA (iron deficiency anemia)   . Adhesive capsulitis of right shoulder 03/01/2012  . Iron deficiency anemia 02/02/2012  . Distal radius fracture 11/30/2011  . Diverticulosis 10/20/2010  . Normocytic anemia 10/01/2010  . GERD 10/01/2010  . FECAL OCCULT BLOOD 10/01/2010    Past Surgical History:  Procedure Laterality Date  . ABDOMINAL HYSTERECTOMY    . ANTERIOR AND POSTERIOR REPAIR  05/17/2012   Procedure: ANTERIOR (CYSTOCELE) AND POSTERIOR REPAIR (RECTOCELE);  Surgeon: Jonnie Kind, MD;  Location: [facility];  Service: Gynecology;  Laterality: N/A;  . BIOPSY  11/22/2018   Procedure: BIOPSY;  Surgeon: Daneil Dolin, MD;  [facility];  Service: Endoscopy;;  . BIOPSY  02/12/2020   Procedure: BIOPSY;  Surgeon: Daneil Dolin, MD;  [facility];  Service: Endoscopy;;  gastric biopsy  . COLONOSCOPY  02/29/2012   Dr. Gala Romney: colonic diverticulosis, minimal internal hemorrhoids, benign polyp  . ESOPHAGOGASTRODUODENOSCOPY  02/29/2012   Dr. Gala Romney: atonic esophagus, moderate-sized hiatal hernia, fundal gland polyps  . ESOPHAGOGASTRODUODENOSCOPY N/A 10/02/2013   Dr. Gala Romney- multiple Wilhemena Durie gastric polyps- not manipulated  . ESOPHAGOGASTRODUODENOSCOPY N/A 01/14/2015   Dr. Gala Romney: Large hiatal hernia, otherwise normal EGD  .  ESOPHAGOGASTRODUODENOSCOPY (EGD) WITH PROPOFOL N/A 11/22/2018   Dr. Gala Romney: normal esophagus, large hiatal hernia, erosive gastropathy s/p biopsy, normal duodenum. Reactive gastropathy  . ESOPHAGOGASTRODUODENOSCOPY (EGD) WITH PROPOFOL N/A 02/12/2020   normal esophagus, large hiatal hernia, [name] lesions, benign gastric biopsy  . GIVENS CAPSULE STUDY  02/29/2012   No explanation for IDA. Possible extrinsic compression but negative CT.   Marland Kitchen SALPINGOOPHORECTOMY   05/17/2012   Procedure: SALPINGO OOPHERECTOMY;  Surgeon: Jonnie Kind, MD;  Location: [facility];  Service: Gynecology;  Laterality: Bilateral;  . VAGINAL HYSTERECTOMY  05/17/2012   Procedure: HYSTERECTOMY VAGINAL;  Surgeon: Jonnie Kind, MD;  Location: [facility];  Service: Gynecology;  Laterality: N/A;     OB History   No obstetric history on file.     Family History  Problem Relation Age of Onset  . Hypertension Mother   . Lymphoma Brother   . Anesthesia problems Neg Hx   . Hypotension Neg Hx   . Malignant hyperthermia Neg Hx   . Pseudochol deficiency Neg Hx   . Colon cancer Neg Hx     Social History   Tobacco Use  . Smoking status: Never Smoker  . Smokeless tobacco: Never Used  . Tobacco comment: Never smoked  Substance Use Topics  . Alcohol use: No    Alcohol/week: 0.0 standard drinks  . Drug use: No    Home Medications Prior to Admission medications   Medication Sig Start Date End Date Taking? Authorizing Provider  CALCIUM PO Take 300-600 mg by mouth daily.     [provider]  cyanocobalamin (,VITAMIN B-12,) 1000 MCG/ML injection Inject 1 mL into the muscle every 30 (thirty) days. 09/06/19   [provider]  FEROSUL 325 (65 Fe) MG tablet TAKE (1) TABLET BY MOUTH DAILY WITH BREAKFAST. 05/31/20   Carlis Stable, NP  lisinopril-hydrochlorothiazide (PRINZIDE,ZESTORETIC) 20-12.5 MG per tablet Take 1 tablet by mouth daily.    [provider]  Multiple Vitamin (MULTIVITAMIN) tablet Take 1 tablet by mouth daily.     [provider]  pantoprazole (PROTONIX) 40 MG tablet Take 1 tablet (40 mg total) by mouth 2 (two) times daily before a meal. 02/13/20 04/02/20  Manuella Ghazi, Pratik D, DO  sucralfate (CARAFATE) 1 g tablet Take 1 tablet (1 g total) by mouth 4 (four) times daily -  with meals and at bedtime. 06/28/20   Milton Ferguson, MD    Allergies    Sulfa antibiotics and Naproxen sodium  Review of Systems   Review of Systems  Constitutional: Negative  for appetite change and fatigue.  HENT: Negative for congestion, ear discharge and sinus pressure.   Eyes: Negative for discharge.  Respiratory: Negative for cough.   Cardiovascular: Negative for chest pain.  Gastrointestinal: Positive for abdominal pain. Negative for diarrhea.       Black stools  Genitourinary: Negative for frequency and hematuria.  Musculoskeletal: Negative for back pain.  Skin: Negative for rash.  Neurological: Negative for seizures and headaches.  Psychiatric/Behavioral: Negative for hallucinations.    Physical Exam Updated Vital Signs BP 111/67   Pulse 81   Temp 98.5 F (36.9 C) (Oral)   Resp 16   Ht 4\' 9"  (1.448 m)   Wt 52.2 kg   SpO2 98%   BMI 24.89 kg/m   Physical Exam Vitals and nursing note reviewed.  Constitutional:      Appearance: She is well-developed.  HENT:     Head: Normocephalic.     Nose: Nose normal.  Eyes:  General: No scleral icterus.    Conjunctiva/sclera: Conjunctivae normal.  Neck:     Thyroid: No thyromegaly.  Cardiovascular:     Rate and Rhythm: Normal rate and regular rhythm.     Heart sounds: No murmur heard.  No friction rub. No gallop.   Pulmonary:     Breath sounds: No stridor. No wheezing or rales.  Chest:     Chest wall: No tenderness.  Abdominal:     General: There is no distension.     Tenderness: There is no abdominal tenderness. There is no rebound.  Genitourinary:    Comments: Rectal exam heme-positive Musculoskeletal:        General: Normal range of motion.     Cervical back: Neck supple.  Lymphadenopathy:     Cervical: No cervical adenopathy.  Skin:    Findings: No erythema or rash.  Neurological:     Mental Status: She is oriented to person, place, and time.     Motor: No abnormal muscle tone.     Coordination: Coordination normal.  Psychiatric:        Behavior: Behavior normal.     ED Results / Procedures / Treatments   Labs (all labs ordered are listed, but only abnormal results are  displayed) Labs Reviewed  CBC WITH DIFFERENTIAL/PLATELET - Abnormal; Notable for the following components:      Result Value   WBC 11.2 (*)    RBC 3.36 (*)    Hemoglobin 11.3 (*)    HCT 34.2 (*)    MCV 101.8 (*)    Neutro Abs 8.4 (*)    All other components within normal limits  COMPREHENSIVE METABOLIC PANEL - Abnormal; Notable for the following components:   Glucose, Bld 111 (*)    BUN 44 (*)    All other components within normal limits  POC OCCULT BLOOD, ED - Abnormal; Notable for the following components:   Fecal Occult Bld POSITIVE (*)    All other components within normal limits  SARS CORONAVIRUS 2 BY RT PCR (HOSPITAL ORDER, [place] [facility])  TYPE AND SCREEN    EKG None  Radiology No results found.  Procedures Procedures (including critical care time)  Medications Ordered in ED Medications  sucralfate (CARAFATE) tablet 1 g (has no administration in time range)  sodium chloride 0.9 % bolus 500 mL (0 mLs Intravenous Stopped 06/28/20 1037)  pantoprazole (PROTONIX) injection 40 mg (40 mg Intravenous Given 06/28/20 0945)    ED Course  I have reviewed the triage vital signs and the nursing notes.  Pertinent labs & imaging results that were available during my care of the patient were reviewed by me and considered in my medical decision making (see chart for details).    MDM Rules/Calculators/A&P                         Patient with upper GI bleed but stable vital signs and adequate hemoglobin.  I spoke with her GI doctor Dr. Gala Romney and he stated to start her on Carafate 4 times a day.  She will follow up with them next week for a hemoglobin and return if any problems        This patient presents to the ED for concern of GI bleed with black stools, this involves an extensive number of treatment options, and is a complaint that carries with it a high risk of complications and morbidity.  The differential diagnosis includes upper GI bleed  Lab  Tests:   I Ordered, reviewed, and interpreted labs, which included CBC and chemistries that showed mild anemia mild elevated BUN  Medicines ordered:   I ordered medication Protonix and Carafate  Imaging Studies ordered:   Additional history obtained:   Additional history obtained from old records and son  Previous records obtained and reviewed.  Consultations Obtained:   I consulted GI and discussed lab and imaging findings  Reevaluation:  After the interventions stated above, I reevaluated the patient and found improved  Critical Interventions:  .    Final Clinical Impression(s) / ED Diagnoses Final diagnoses:  UGI bleed    Rx / DC Orders ED Discharge Orders         Ordered    sucralfate (CARAFATE) 1 g tablet  3 times daily with meals & bedtime     Discontinue  Reprint     06/28/20 1143           Milton Ferguson, MD 06/28/20 1155

## 2020-07-01 ENCOUNTER — Other Ambulatory Visit: Payer: Self-pay

## 2020-07-01 DIAGNOSIS — D649 Anemia, unspecified: Secondary | ICD-10-CM

## 2020-07-01 LAB — HEMOGLOBIN AND HEMATOCRIT, BLOOD
HCT: 29.7 % — ABNORMAL LOW (ref 35.0–45.0)
Hemoglobin: 9.8 g/dL — ABNORMAL LOW (ref 11.7–15.5)

## 2020-07-01 NOTE — Telephone Encounter (Signed)
Pt returned call. Pt started Carafate as directed. Pt is going to go to [place] lab today to have labs completed. Order placed and released.

## 2020-07-01 NOTE — Telephone Encounter (Signed)
Pt is coming in for an OV with Roseanne Kaufman, NP tomorrow 07/02/20 at 3:00 PM.

## 2020-07-02 ENCOUNTER — Other Ambulatory Visit: Payer: Self-pay

## 2020-07-02 ENCOUNTER — Encounter: Payer: Self-pay | Admitting: Gastroenterology

## 2020-07-02 ENCOUNTER — Ambulatory Visit (INDEPENDENT_AMBULATORY_CARE_PROVIDER_SITE_OTHER): Payer: Medicare Other | Admitting: Gastroenterology

## 2020-07-02 VITALS — BP 133/71 | HR 82 | Temp 97.1°F | Ht <= 58 in | Wt 112.8 lb

## 2020-07-02 DIAGNOSIS — D62 Acute posthemorrhagic anemia: Secondary | ICD-10-CM | POA: Diagnosis not present

## 2020-07-02 DIAGNOSIS — K922 Gastrointestinal hemorrhage, unspecified: Secondary | ICD-10-CM | POA: Diagnosis not present

## 2020-07-02 DIAGNOSIS — K921 Melena: Secondary | ICD-10-CM | POA: Diagnosis not present

## 2020-07-02 DIAGNOSIS — D509 Iron deficiency anemia, unspecified: Secondary | ICD-10-CM

## 2020-07-02 MED ORDER — FERROUS SULFATE 325 (65 FE) MG PO TABS: 325.0000 mg | ORAL_TABLET | Freq: Two times a day (BID) | ORAL | 2 refills | Status: DC

## 2020-07-02 NOTE — Progress Notes (Signed)
Referring Provider: Renee Rival, NP Primary Care Physician:  Renee Rival, NP  Primary GI: Dr. Gala Romney   Chief Complaint  Patient presents with  . Anemia    f/u from ED visit. Still having dark stools    HPI:   Wanda [name] is an 86 y.o. female presenting today with a history of IDA, large hiatal hernia with [name] lesions. Presented to ED last week with melena. Hgb was 11.3, slightly down from 12 a month prior. Repeat H/H yesterday with Hgb 9.8.    Felt nauseated and then had several episodes of melena prior to ED visit. No abdominal pain. Last episode last week. Stool brown. Taking iron once daily. Fatigued. Doesn't want another colonoscopy. No further overt GI bleeding since visit in ED. Feels like carafate has helped her a lot. Feels better on this. Iron was once per day. Denies NSAIDs, aspirin powders.   Past Medical History:  Diagnosis Date  . Anemia   . Cystocele   . Diverticulosis 10/20/10   Left-sided on colonoscopy by Dr. Gala Romney 10/20/10  . Gastric polyps 10/02/2013  . Gastritis 12/08/10   EGD by Dr. Altha Harm hiatal hernia, duodenal diverticulum, chronic gastritis  . GERD (gastroesophageal reflux disease)   . Heart murmur   . Hiatal hernia 10/02/2013  . HTN (hypertension)   . IDA (iron deficiency anemia)   . 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 Osteoarthritis   . Radial fracture    (right) Undergoing treatment by Dr. Aline Brochure currently  . Sleep apnea    STOP BANG SCORE 4  . Urge incontinence   . Vitamin D deficiency     Past Surgical History:  Procedure Laterality Date  . ABDOMINAL HYSTERECTOMY    . ANTERIOR AND POSTERIOR REPAIR  05/17/2012   Procedure: ANTERIOR (CYSTOCELE) AND POSTERIOR REPAIR (RECTOCELE);  Surgeon: Jonnie Kind, MD;  Location: [facility];  Service: Gynecology;  Laterality: N/A;  . BIOPSY  11/22/2018   Procedure: BIOPSY;  Surgeon: Daneil Dolin, MD;  Location: [facility];  Service: Endoscopy;;  . BIOPSY  02/12/2020    Procedure: BIOPSY;  Surgeon: Daneil Dolin, MD;  Location: [facility];  Service: Endoscopy;;  gastric biopsy  . COLONOSCOPY  02/29/2012   Dr. Gala Romney: colonic diverticulosis, minimal internal hemorrhoids, benign polyp  . ESOPHAGOGASTRODUODENOSCOPY  02/29/2012   Dr. Gala Romney: atonic esophagus, moderate-sized hiatal hernia, fundal gland polyps  . ESOPHAGOGASTRODUODENOSCOPY N/A 10/02/2013   Dr. Gala Romney- multiple Wilhemena Durie gastric polyps- not manipulated  . ESOPHAGOGASTRODUODENOSCOPY N/A 01/14/2015   Dr. Gala Romney: Large hiatal hernia, otherwise normal EGD  . ESOPHAGOGASTRODUODENOSCOPY (EGD) WITH PROPOFOL N/A 11/22/2018   Dr. Gala Romney: normal esophagus, large hiatal hernia, erosive gastropathy s/p biopsy, normal duodenum. Reactive gastropathy  . ESOPHAGOGASTRODUODENOSCOPY (EGD) WITH PROPOFOL N/A 02/12/2020   normal esophagus, large hiatal hernia, [name] lesions, benign gastric biopsy  . GIVENS CAPSULE STUDY  02/29/2012   No explanation for IDA. Possible extrinsic compression but negative CT.   Marland Kitchen SALPINGOOPHORECTOMY  05/17/2012   Procedure: SALPINGO OOPHERECTOMY;  Surgeon: Jonnie Kind, MD;  Location: [facility];  Service: Gynecology;  Laterality: Bilateral;  . VAGINAL HYSTERECTOMY  05/17/2012   Procedure: HYSTERECTOMY VAGINAL;  Surgeon: Jonnie Kind, MD;  Location: [facility];  Service: Gynecology;  Laterality: N/A;    Current Outpatient Medications  Medication Sig Dispense Refill  . CALCIUM PO Take 300-600 mg by mouth.     . cyanocobalamin (,VITAMIN B-12,) 1000 MCG/ML injection Inject 1 mL into the muscle  every 30 (thirty) days.    . FEROSUL 325 (65 Fe) MG tablet TAKE (1) TABLET BY MOUTH DAILY WITH BREAKFAST. 30 tablet 0  . lisinopril-hydrochlorothiazide (PRINZIDE,ZESTORETIC) 20-12.5 MG per tablet Take 1 tablet by mouth daily.    . Multiple Vitamin (MULTIVITAMIN) tablet Take 1 tablet by mouth.     . pantoprazole (PROTONIX) 40 MG tablet Take 1 tablet (40 mg total) by mouth 2 (two) times daily before  a meal. 60 tablet 2  . sucralfate (CARAFATE) 1 g tablet Take 1 tablet (1 g total) by mouth 4 (four) times daily -  with meals and at bedtime. 120 tablet 1   No current facility-administered medications for this visit.    Allergies as of 07/02/2020 - Review Complete 07/02/2020  Allergen Reaction Noted  . Sulfa antibiotics  06/23/2016  . Naproxen sodium Rash and Other (See Comments)     Family History  Problem Relation Age of Onset  . Hypertension Mother   . Lymphoma Brother   . Anesthesia problems Neg Hx   . Hypotension Neg Hx   . Malignant hyperthermia Neg Hx   . Pseudochol deficiency Neg Hx   . Colon cancer Neg Hx     Social History   Socioeconomic History  . Marital status: Widowed    Spouse name: Not on file  . Number of children: Not on file  . Years of education: Not on file  . Highest education level: Not on file  Occupational History  . Occupation: Retired    Fish farm manager: RETIRED  Tobacco Use  . Smoking status: Never Smoker  . Smokeless tobacco: Never Used  . Tobacco comment: Never smoked  Substance and Sexual Activity  . Alcohol use: No    Alcohol/week: 0.0 standard drinks  . Drug use: No  . Sexual activity: Not Currently    Birth control/protection: Surgical  Other Topics Concern  . Not on file  Social History Narrative  . Not on file   Social Determinants of Health   Financial Resource Strain:   . Difficulty of Paying Living Expenses:   Food Insecurity:   . Worried About Charity fundraiser in the Last Year:   . Arboriculturist in the Last Year:   Transportation Needs:   . Film/video editor (Medical):   Marland Kitchen Lack of Transportation (Non-Medical):   Physical Activity:   . Days of Exercise per Week:   . Minutes of Exercise per Session:   Stress:   . Feeling of Stress :   Social Connections:   . Frequency of Communication with Friends and Family:   . Frequency of Social Gatherings with Friends and Family:   . Attends Religious Services:   .  Active Member of Clubs or Organizations:   . Attends Archivist Meetings:   Marland Kitchen Marital Status:     Review of Systems: Gen: Denies fever, chills, anorexia. Denies fatigue, weakness, weight loss.  CV: Denies chest pain, palpitations, syncope, peripheral edema, and claudication. Resp: Denies dyspnea at rest, cough, wheezing, coughing up blood, and pleurisy. GI: see HPI Derm: Denies rash, itching, dry skin Psych: Denies depression, anxiety, memory loss, confusion. No homicidal or suicidal ideation.  Heme: see HPI  Physical Exam: BP 133/71   Pulse 82   Temp (!) 97.1 F (36.2 C)   Ht 4\' 9"  (1.448 m)   Wt 112 lb 12.8 oz (51.2 kg)   BMI 24.41 kg/m  General:   Alert and oriented. No distress noted. Pleasant and  cooperative.  Head:  Normocephalic and atraumatic. Eyes:  Conjuctiva clear without scleral icterus. Mouth:  Mask in place Abdomen:  +BS, soft, non-tender and non-distended. No rebound or guarding. No HSM or masses noted. Msk:  Symmetrical without gross deformities. Normal posture. Extremities:  Without edema. Neurologic:  Alert and  oriented x4 Psych:  Alert and cooperative. Normal mood and affect.  ASSESSMENT: Wanda [name] is an 86 y.o. female presenting today in follow-up after ED presentation for melena and acute blood loss anemia. She has a known history of IDA, large hiatal hernia with Lysbeth Galas lesions, with last EGD in March 2021. Denying any NSAIDs.   Fortunately, overt GI bleeding has stopped. She did have drop in Hgb to 9.8. We will be rechecking this soon along with iron studies barring any further overt GI bleeding. I have asked her to increase iron to BID for now. Source of bleeding likely Lysbeth Galas lesions; last colonoscopy in 2013 and declining updating now. Unable to rule out right-sided colon lesion but with her known large hiatal hernia, this is most likely the culprit.   She is to call if any further overt bleeding. Continue avoidance of NSAIDs.  Recheck CBC, iron studies as planned.   PLAN:  CBC, iron studies again in near future Increase iron to BID for now until repeat blood work Monitor for any overt bleeding Continue Carafate for now Continue PPI BID Return in 4 months as planned  Annitta Needs, PhD, ANP-BC [facility] Gastroenterology

## 2020-07-02 NOTE — Patient Instructions (Addendum)
Take iron after breakfast and after dinner. I have increased this to twice a day until we repeat blood work in September. We will mail this to you.  Please call if further black stool, fatigue. Continue carafate four times per day before meals and at bedtime.   We will see you in 4 months!  I enjoyed seeing you again today! As you know, I value our relationship and want to provide genuine, compassionate, and quality care. I welcome your feedback. If you receive a survey regarding your visit,  I greatly appreciate you taking time to fill this out. See you next time!  Annitta Needs, PhD, ANP-BC [facility] Gastroenterology

## 2020-07-03 ENCOUNTER — Encounter: Payer: Self-pay | Admitting: Internal Medicine

## 2020-07-09 NOTE — Progress Notes (Signed)
CC'ED TO PCP 

## 2020-07-24 ENCOUNTER — Other Ambulatory Visit: Payer: Self-pay

## 2020-07-24 DIAGNOSIS — D509 Iron deficiency anemia, unspecified: Secondary | ICD-10-CM

## 2020-07-30 ENCOUNTER — Other Ambulatory Visit: Payer: Self-pay

## 2020-07-30 DIAGNOSIS — R7989 Other specified abnormal findings of blood chemistry: Secondary | ICD-10-CM

## 2020-07-30 LAB — CBC WITH DIFFERENTIAL/PLATELET
Absolute Monocytes: 490 cells/uL (ref 200–950)
Basophils Absolute: 50 cells/uL (ref 0–200)
Basophils Relative: 0.7 %
Eosinophils Absolute: 130 cells/uL (ref 15–500)
Eosinophils Relative: 1.8 %
HCT: 36.5 % (ref 35.0–45.0)
Hemoglobin: 12.1 g/dL (ref 11.7–15.5)
Lymphs Abs: 1822 cells/uL (ref 850–3900)
MCH: 32.2 pg (ref 27.0–33.0)
MCHC: 33.2 g/dL (ref 32.0–36.0)
MCV: 97.1 fL (ref 80.0–100.0)
MPV: 10.8 fL (ref 7.5–12.5)
Monocytes Relative: 6.8 %
Neutro Abs: 4709 cells/uL (ref 1500–7800)
Neutrophils Relative %: 65.4 %
Platelets: 346 10*3/uL (ref 140–400)
RBC: 3.76 10*6/uL — ABNORMAL LOW (ref 3.80–5.10)
RDW: 11.3 % (ref 11.0–15.0)
Total Lymphocyte: 25.3 %
WBC: 7.2 10*3/uL (ref 3.8–10.8)

## 2020-07-30 LAB — IRON,TIBC AND FERRITIN PANEL
%SAT: 51 % (calc) — ABNORMAL HIGH (ref 16–45)
Ferritin: 132 ng/mL (ref 16–288)
Iron: 164 ug/dL — ABNORMAL HIGH (ref 45–160)
TIBC: 322 mcg/dL (calc) (ref 250–450)

## 2020-08-05 ENCOUNTER — Telehealth: Payer: Self-pay | Admitting: Internal Medicine

## 2020-08-05 NOTE — Telephone Encounter (Signed)
Pt is confused by the letter she received about her labs. She said she has already had labs done and then received another letter it was time to have more done. Please advise. [phone]

## 2020-08-05 NOTE — Telephone Encounter (Signed)
Pt received lab work order to have labs drawn.  Pt said that she recently had labs drawn.  Looks like it may have been stimulated from ov 03/2020 reminder letter.  Pt advised to keep lab work order and take it in 6 weeks around 09/16/2020.

## 2020-10-30 ENCOUNTER — Ambulatory Visit: Payer: Medicare Other | Admitting: Gastroenterology

## 2020-11-25 ENCOUNTER — Other Ambulatory Visit: Payer: Self-pay

## 2020-11-25 ENCOUNTER — Telehealth: Payer: Self-pay | Admitting: Internal Medicine

## 2020-11-25 DIAGNOSIS — R195 Other fecal abnormalities: Secondary | ICD-10-CM

## 2020-11-25 LAB — CBC WITH DIFFERENTIAL/PLATELET
Absolute Monocytes: 980 cells/uL — ABNORMAL HIGH (ref 200–950)
Basophils Absolute: 47 cells/uL (ref 0–200)
Basophils Relative: 0.3 %
Eosinophils Absolute: 32 cells/uL (ref 15–500)
Eosinophils Relative: 0.2 %
HCT: 27.7 % — ABNORMAL LOW (ref 35.0–45.0)
Hemoglobin: 9.3 g/dL — ABNORMAL LOW (ref 11.7–15.5)
Lymphs Abs: 2512 cells/uL (ref 850–3900)
MCH: 31.8 pg (ref 27.0–33.0)
MCHC: 33.6 g/dL (ref 32.0–36.0)
MCV: 94.9 fL (ref 80.0–100.0)
MPV: 10.6 fL (ref 7.5–12.5)
Monocytes Relative: 6.2 %
Neutro Abs: 12229 cells/uL — ABNORMAL HIGH (ref 1500–7800)
Neutrophils Relative %: 77.4 %
Platelets: 348 10*3/uL (ref 140–400)
RBC: 2.92 10*6/uL — ABNORMAL LOW (ref 3.80–5.10)
RDW: 12.9 % (ref 11.0–15.0)
Total Lymphocyte: 15.9 %
WBC: 15.8 10*3/uL — ABNORMAL HIGH (ref 3.8–10.8)

## 2020-11-25 LAB — TIQ-MISC

## 2020-11-25 MED ORDER — ONDANSETRON HCL 4 MG PO TABS
4.0000 mg | ORAL_TABLET | Freq: Three times a day (TID) | ORAL | 1 refills | Status: DC | PRN
Start: 2020-11-25 — End: 2020-12-27

## 2020-11-25 NOTE — Telephone Encounter (Signed)
Wanda [name], son, [phone], called and said that his mother is vomiting and it appears dark like there may be blood in it. She is also having dark stools

## 2020-11-25 NOTE — Telephone Encounter (Signed)
Spoke with pts son. Discussed per Roseanne Kaufman, NP that ED evaluation is advised. Pt is going to have STAT labs completed per pts son. He was advised to have pt take her PPI bid and Zofran 4 mg q 8 hours for n/v.

## 2020-11-25 NOTE — Telephone Encounter (Signed)
Spoke with pts son. Pt has vomited 5 times through the night. Color was black like coffee and pts stool is black. Pt was advised to go to the ED for evaluation for possible GI bleed. Pt refuses to go to the ED and is asking if lab work can be done for the dark stool. Please advise

## 2020-11-25 NOTE — Telephone Encounter (Signed)
Known large hiatal hernia with Lysbeth Galas lesions. I do recommend ED visit but if continues to decline this, can do stat CBC.   Make sure taking PPI BID. I am sending in Zofran as well. Take 4 mg every 8 hours as needed for N/V.

## 2020-11-25 NOTE — Addendum Note (Signed)
Addended by: Annitta Needs on: 11/25/2020 10:02 AM   Modules accepted: Orders

## 2020-11-26 ENCOUNTER — Other Ambulatory Visit: Payer: Self-pay

## 2020-11-26 DIAGNOSIS — R195 Other fecal abnormalities: Secondary | ICD-10-CM

## 2020-11-28 LAB — CBC WITH DIFFERENTIAL/PLATELET
Absolute Monocytes: 651 cells/uL (ref 200–950)
Basophils Absolute: 62 cells/uL (ref 0–200)
Basophils Relative: 0.7 %
Eosinophils Absolute: 220 cells/uL (ref 15–500)
Eosinophils Relative: 2.5 %
HCT: 26 % — ABNORMAL LOW (ref 35.0–45.0)
Hemoglobin: 8.6 g/dL — ABNORMAL LOW (ref 11.7–15.5)
Lymphs Abs: 2191 cells/uL (ref 850–3900)
MCH: 31.5 pg (ref 27.0–33.0)
MCHC: 33.1 g/dL (ref 32.0–36.0)
MCV: 95.2 fL (ref 80.0–100.0)
MPV: 10.9 fL (ref 7.5–12.5)
Monocytes Relative: 7.4 %
Neutro Abs: 5676 cells/uL (ref 1500–7800)
Neutrophils Relative %: 64.5 %
Platelets: 362 10*3/uL (ref 140–400)
RBC: 2.73 10*6/uL — ABNORMAL LOW (ref 3.80–5.10)
RDW: 12.9 % (ref 11.0–15.0)
Total Lymphocyte: 24.9 %
WBC: 8.8 10*3/uL (ref 3.8–10.8)

## 2020-11-29 ENCOUNTER — Telehealth (INDEPENDENT_AMBULATORY_CARE_PROVIDER_SITE_OTHER): Payer: Self-pay | Admitting: Gastroenterology

## 2020-11-29 NOTE — Telephone Encounter (Signed)
Spoke with patient's husband today, who reported the patient had repeat blood testing today which showed drop in her hemoglobin to 8.6 from a previous value of 9.3.  He states that her stools have been dark after she started taking iron but has not noticed any tarry stool or hematochezia, no presence of lightheadedness, dizziness or syncope.  She has felt well otherwise.  I advised him that this drop is less than 1 g and as long as she does not present any of the previous symptoms this will be readdressed in the GI clinic.  He understood and agreed.  If she presents any of the symptoms she will be taken to the ER.  [name], MD Gastroenterology and Hepatology [facility]

## 2020-11-29 NOTE — Telephone Encounter (Signed)
Noted. Thanks.

## 2020-12-02 NOTE — Telephone Encounter (Signed)
Noted. Thanks! Wanda [name], can we check on her this week and make sure she has not had any further overt GI bleeding? We should recheck a CBC Wednesday/Thursday at latest.

## 2020-12-04 ENCOUNTER — Other Ambulatory Visit: Payer: Self-pay

## 2020-12-04 DIAGNOSIS — D649 Anemia, unspecified: Secondary | ICD-10-CM

## 2020-12-04 MED ORDER — SUCRALFATE 1 G PO TABS: 1.0000 g | ORAL_TABLET | Freq: Three times a day (TID) | ORAL | 1 refills | Status: DC

## 2020-12-04 NOTE — Addendum Note (Signed)
Addended by: Mahala Menghini on: 12/04/2020 02:32 PM   Modules accepted: Orders

## 2020-12-04 NOTE — Telephone Encounter (Signed)
rx completed

## 2020-12-04 NOTE — Telephone Encounter (Signed)
Lmom for pts son Phill to call back. Waiting on a return call.

## 2020-12-04 NOTE — Telephone Encounter (Signed)
Noted  

## 2020-12-04 NOTE — Telephone Encounter (Signed)
Spoke with pts son. Pt is feeling better this week. Pt hasn't noticed any bleeding. Labs will be completed this week. Orders placed. Pt is also requesting a refill on her Carafate 1gm 4 times daily.

## 2020-12-05 LAB — CBC WITH DIFFERENTIAL/PLATELET
Absolute Monocytes: 509 cells/uL (ref 200–950)
Basophils Absolute: 53 cells/uL (ref 0–200)
Basophils Relative: 0.7 %
Eosinophils Absolute: 220 cells/uL (ref 15–500)
Eosinophils Relative: 2.9 %
HCT: 26.5 % — ABNORMAL LOW (ref 35.0–45.0)
Hemoglobin: 8.7 g/dL — ABNORMAL LOW (ref 11.7–15.5)
Lymphs Abs: 1634 cells/uL (ref 850–3900)
MCH: 31.2 pg (ref 27.0–33.0)
MCHC: 32.8 g/dL (ref 32.0–36.0)
MCV: 95 fL (ref 80.0–100.0)
MPV: 10.5 fL (ref 7.5–12.5)
Monocytes Relative: 6.7 %
Neutro Abs: 5183 cells/uL (ref 1500–7800)
Neutrophils Relative %: 68.2 %
Platelets: 415 10*3/uL — ABNORMAL HIGH (ref 140–400)
RBC: 2.79 10*6/uL — ABNORMAL LOW (ref 3.80–5.10)
RDW: 12.7 % (ref 11.0–15.0)
Total Lymphocyte: 21.5 %
WBC: 7.6 10*3/uL (ref 3.8–10.8)

## 2020-12-26 ENCOUNTER — Encounter (HOSPITAL_COMMUNITY): Payer: Self-pay | Admitting: Emergency Medicine

## 2020-12-26 ENCOUNTER — Observation Stay (HOSPITAL_COMMUNITY): Payer: Medicare Other | Admitting: Anesthesiology

## 2020-12-26 ENCOUNTER — Other Ambulatory Visit: Payer: Self-pay

## 2020-12-26 ENCOUNTER — Observation Stay (HOSPITAL_COMMUNITY)
Admission: EM | Admit: 2020-12-26 | Discharge: 2020-12-27 | Disposition: A | Discharge status: Home / Self Care | Payer: Medicare Other | Attending: Family Medicine | Admitting: Family Medicine

## 2020-12-26 ENCOUNTER — Encounter (HOSPITAL_COMMUNITY)
Admission: EM | Disposition: A | Discharge status: Home / Self Care | Payer: Self-pay | Source: Home / Self Care | Attending: Emergency Medicine

## 2020-12-26 DIAGNOSIS — R1310 Dysphagia, unspecified: Secondary | ICD-10-CM | POA: Diagnosis not present

## 2020-12-26 DIAGNOSIS — K921 Melena: Secondary | ICD-10-CM

## 2020-12-26 DIAGNOSIS — K449 Diaphragmatic hernia without obstruction or gangrene: Secondary | ICD-10-CM | POA: Insufficient documentation

## 2020-12-26 DIAGNOSIS — D509 Iron deficiency anemia, unspecified: Secondary | ICD-10-CM

## 2020-12-26 DIAGNOSIS — Z20822 Contact with and (suspected) exposure to covid-19: Secondary | ICD-10-CM | POA: Diagnosis not present

## 2020-12-26 DIAGNOSIS — D519 Vitamin B12 deficiency anemia, unspecified: Secondary | ICD-10-CM | POA: Diagnosis not present

## 2020-12-26 DIAGNOSIS — D500 Iron deficiency anemia secondary to blood loss (chronic): Secondary | ICD-10-CM | POA: Insufficient documentation

## 2020-12-26 DIAGNOSIS — Z79899 Other long term (current) drug therapy: Secondary | ICD-10-CM | POA: Insufficient documentation

## 2020-12-26 DIAGNOSIS — E876 Hypokalemia: Secondary | ICD-10-CM | POA: Insufficient documentation

## 2020-12-26 DIAGNOSIS — K922 Gastrointestinal hemorrhage, unspecified: Principal | ICD-10-CM | POA: Diagnosis present

## 2020-12-26 DIAGNOSIS — I10 Essential (primary) hypertension: Secondary | ICD-10-CM | POA: Diagnosis not present

## 2020-12-26 HISTORY — PX: ESOPHAGOGASTRODUODENOSCOPY (EGD) WITH PROPOFOL: SHX5813

## 2020-12-26 HISTORY — PX: GIVENS CAPSULE STUDY: SHX5432

## 2020-12-26 LAB — CBC WITH DIFFERENTIAL/PLATELET
Abs Immature Granulocytes: 0.06 10*3/uL (ref 0.00–0.07)
Basophils Absolute: 0.1 10*3/uL (ref 0.0–0.1)
Basophils Relative: 1 %
Eosinophils Absolute: 0 10*3/uL (ref 0.0–0.5)
Eosinophils Relative: 0 %
HCT: 30.8 % — ABNORMAL LOW (ref 36.0–46.0)
Hemoglobin: 9.4 g/dL — ABNORMAL LOW (ref 12.0–15.0)
Immature Granulocytes: 1 %
Lymphocytes Relative: 9 %
Lymphs Abs: 1 10*3/uL (ref 0.7–4.0)
MCH: 31.3 pg (ref 26.0–34.0)
MCHC: 30.5 g/dL (ref 30.0–36.0)
MCV: 102.7 fL — ABNORMAL HIGH (ref 80.0–100.0)
Monocytes Absolute: 0.4 10*3/uL (ref 0.1–1.0)
Monocytes Relative: 4 %
Neutro Abs: 9.2 10*3/uL — ABNORMAL HIGH (ref 1.7–7.7)
Neutrophils Relative %: 85 %
Platelets: 323 10*3/uL (ref 150–400)
RBC: 3 MIL/uL — ABNORMAL LOW (ref 3.87–5.11)
RDW: 12.7 % (ref 11.5–15.5)
WBC: 10.7 10*3/uL — ABNORMAL HIGH (ref 4.0–10.5)
nRBC: 0 % (ref 0.0–0.2)

## 2020-12-26 LAB — COMPREHENSIVE METABOLIC PANEL
ALT: 23 U/L (ref 0–44)
AST: 27 U/L (ref 15–41)
Albumin: 3.7 g/dL (ref 3.5–5.0)
Alkaline Phosphatase: 76 U/L (ref 38–126)
Anion gap: 8 (ref 5–15)
BUN: 49 mg/dL — ABNORMAL HIGH (ref 8–23)
CO2: 23 mmol/L (ref 22–32)
Calcium: 8.8 mg/dL — ABNORMAL LOW (ref 8.9–10.3)
Chloride: 107 mmol/L (ref 98–111)
Creatinine, Ser: 0.69 mg/dL (ref 0.44–1.00)
GFR, Estimated: >60 mL/min (ref >60)
Glucose, Bld: 115 mg/dL — ABNORMAL HIGH (ref 70–99)
Potassium: 4.1 mmol/L (ref 3.5–5.1)
Sodium: 138 mmol/L (ref 135–145)
Total Bilirubin: 1.1 mg/dL (ref 0.3–1.2)
Total Protein: 6.7 g/dL (ref 6.5–8.1)

## 2020-12-26 LAB — CBC
HCT: 22.9 % — ABNORMAL LOW (ref 36.0–46.0)
Hemoglobin: 7.1 g/dL — ABNORMAL LOW (ref 12.0–15.0)
MCH: 31.1 pg (ref 26.0–34.0)
MCHC: 31 g/dL (ref 30.0–36.0)
MCV: 100.4 fL — ABNORMAL HIGH (ref 80.0–100.0)
Platelets: 277 10*3/uL (ref 150–400)
RBC: 2.28 MIL/uL — ABNORMAL LOW (ref 3.87–5.11)
RDW: 12.9 % (ref 11.5–15.5)
WBC: 7.8 10*3/uL (ref 4.0–10.5)
nRBC: 0 % (ref 0.0–0.2)

## 2020-12-26 LAB — IRON AND TIBC
Iron: 81 ug/dL (ref 28–170)
Saturation Ratios: 23 % (ref 10.4–31.8)
TIBC: 350 ug/dL (ref 250–450)
UIBC: 269 ug/dL

## 2020-12-26 LAB — VITAMIN B12: Vitamin B-12: 205 pg/mL (ref 180–914)

## 2020-12-26 LAB — RESP PANEL BY RT-PCR (FLU A&B, COVID) ARPGX2
Influenza A by PCR: NEGATIVE
Influenza B by PCR: NEGATIVE
SARS Coronavirus 2 by RT PCR: NEGATIVE

## 2020-12-26 LAB — POC OCCULT BLOOD, ED: Fecal Occult Bld: POSITIVE — AB

## 2020-12-26 LAB — FERRITIN: Ferritin: 38 ng/mL (ref 11–307)

## 2020-12-26 SURGERY — ESOPHAGOGASTRODUODENOSCOPY (EGD) WITH PROPOFOL
Anesthesia: General

## 2020-12-26 MED ORDER — SODIUM CHLORIDE 0.9% FLUSH: 3.0000 mL | INTRAVENOUS | Status: DC | PRN

## 2020-12-26 MED ORDER — HYDRALAZINE HCL 20 MG/ML IJ SOLN: 10.0000 mg | Freq: Four times a day (QID) | INTRAMUSCULAR | Status: DC | PRN

## 2020-12-26 MED ORDER — ACETAMINOPHEN 325 MG PO TABS: 650.0000 mg | ORAL_TABLET | Freq: Four times a day (QID) | ORAL | Status: DC | PRN

## 2020-12-26 MED ORDER — ACETAMINOPHEN 650 MG RE SUPP
650.0000 mg | Freq: Four times a day (QID) | RECTAL | Status: DC | PRN
  Filled 2020-12-26: qty 1

## 2020-12-26 MED ORDER — SODIUM CHLORIDE 0.9 % IV SOLN: 250.0000 mL | INTRAVENOUS | Status: DC | PRN

## 2020-12-26 MED ORDER — SODIUM CHLORIDE 0.9% FLUSH
3.0000 mL | Freq: Two times a day (BID) | INTRAVENOUS | Status: DC
  Administered 2020-12-27: 3 mL via INTRAVENOUS

## 2020-12-26 MED ORDER — LACTATED RINGERS IV SOLN: INTRAVENOUS | Status: DC

## 2020-12-26 MED ORDER — STERILE WATER FOR IRRIGATION IR SOLN
Status: DC | PRN
  Administered 2020-12-26: 100 mL

## 2020-12-26 MED ORDER — SODIUM CHLORIDE 0.9 % IV BOLUS (SEPSIS)
1000.0000 mL | Freq: Once | INTRAVENOUS | Status: AC
  Administered 2020-12-26: 1000 mL via INTRAVENOUS

## 2020-12-26 MED ORDER — TRAZODONE HCL 50 MG PO TABS
50.0000 mg | ORAL_TABLET | Freq: Every evening | ORAL | Status: DC | PRN
Start: 2020-12-26 — End: 2020-12-27
  Filled 2020-12-26: qty 1

## 2020-12-26 MED ORDER — SODIUM CHLORIDE 0.9 % IV SOLN: INTRAVENOUS | Status: DC

## 2020-12-26 MED ORDER — PROPOFOL 10 MG/ML IV BOLUS
INTRAVENOUS | Status: DC | PRN
  Administered 2020-12-26: 100 mg via INTRAVENOUS

## 2020-12-26 MED ORDER — LIDOCAINE HCL (CARDIAC) PF 100 MG/5ML IV SOSY
PREFILLED_SYRINGE | INTRAVENOUS | Status: DC | PRN
  Administered 2020-12-26 (×2): 50 mg via INTRAVENOUS

## 2020-12-26 MED ORDER — PANTOPRAZOLE SODIUM 40 MG IV SOLR
40.0000 mg | Freq: Two times a day (BID) | INTRAVENOUS | Status: DC
  Administered 2020-12-26 – 2020-12-27 (×3): 40 mg via INTRAVENOUS
  Filled 2020-12-26 (×3): qty 40

## 2020-12-26 MED ORDER — ONDANSETRON HCL 4 MG/2ML IJ SOLN: 4.0000 mg | Freq: Four times a day (QID) | INTRAMUSCULAR | Status: DC | PRN

## 2020-12-26 MED ORDER — POLYETHYLENE GLYCOL 3350 17 G PO PACK
17.0000 g | PACK | Freq: Every day | ORAL | Status: DC | PRN
  Filled 2020-12-26: qty 1

## 2020-12-26 MED ORDER — PROPOFOL 500 MG/50ML IV EMUL
INTRAVENOUS | Status: DC | PRN
  Administered 2020-12-26: 125 ug/kg/min via INTRAVENOUS

## 2020-12-26 MED ORDER — LACTATED RINGERS IV SOLN: INTRAVENOUS | Status: DC | PRN

## 2020-12-26 MED ORDER — ONDANSETRON HCL 4 MG PO TABS
4.0000 mg | ORAL_TABLET | Freq: Four times a day (QID) | ORAL | Status: DC | PRN
  Filled 2020-12-26: qty 1

## 2020-12-26 MED ORDER — CYANOCOBALAMIN 1000 MCG/ML IJ SOLN
1000.0000 ug | Freq: Once | INTRAMUSCULAR | Status: AC
  Administered 2020-12-26: 1000 ug via INTRAMUSCULAR
  Filled 2020-12-26 (×2): qty 1

## 2020-12-26 MED ORDER — BISACODYL 10 MG RE SUPP
10.0000 mg | Freq: Every day | RECTAL | Status: DC | PRN
Start: 2020-12-26 — End: 2020-12-27
  Filled 2020-12-26: qty 1

## 2020-12-26 MED ORDER — PANTOPRAZOLE SODIUM 40 MG IV SOLR
40.0000 mg | Freq: Once | INTRAVENOUS | Status: AC
  Administered 2020-12-26: 40 mg via INTRAVENOUS
  Filled 2020-12-26: qty 40

## 2020-12-26 NOTE — ED Provider Notes (Addendum)
[facility] EMERGENCY DEPARTMENT Provider Note   CSN: [id number] Arrival date & time: 12/26/20  3295     History Chief Complaint  Patient presents with  . Melena    Wanda [name] is a 87 y.o. female.  Patient states she has been having significant black stool since last night and they have become more loose.  Patient has a history of severe gastritis and upper GI bleed before.  The history is provided by the patient and medical records. No language interpreter was used.  Rectal Bleeding Quality:  Black and tarry Amount:  Moderate Timing:  Constant Chronicity:  Recurrent Context: not anal fissures   Similar prior episodes: no   Relieved by:  Nothing Worsened by:  Nothing Associated symptoms: no abdominal pain        Past Medical History:  Diagnosis Date  . Anemia   . Cystocele   . Diverticulosis 10/20/10   Left-sided on colonoscopy by Dr. Gala Romney 10/20/10  . Gastric polyps 10/02/2013  . Gastritis 12/08/10   EGD by Dr. Altha Harm hiatal hernia, duodenal diverticulum, chronic gastritis  . GERD (gastroesophageal reflux disease)   . Heart murmur   . Hiatal hernia 10/02/2013  . HTN (hypertension)   . IDA (iron deficiency anemia)   . 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 Osteoarthritis   . Radial fracture    (right) Undergoing treatment by Dr. Aline Brochure currently  . Sleep apnea    STOP BANG SCORE 4  . Urge incontinence   . Vitamin D deficiency     Patient Active Problem List   Diagnosis Date Noted  . Acute GI bleeding 12/26/2020  . Acute upper GI bleed 02/11/2020  . Loss of weight 06/28/2019  . Sleep apnea 11/20/2018  . Hypokalemia 11/20/2018  . Leukocytosis 11/20/2018  . Symptomatic anemia 11/04/2018  . Diarrhea, functional 03/15/2015  . Fatigue 12/28/2014  . Upper abdominal pain 12/28/2014  . B12 deficiency 12/28/2014    Class: History of  . Acute blood loss anemia 10/02/2013  . GI bleed 10/02/2013  . Melena 10/02/2013  . Hematemesis 10/02/2013   . Sinus tachycardia 10/02/2013  . 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 Hiatal hernia 10/02/2013  . Gastric polyps 10/02/2013  . HTN (hypertension)   . IDA (iron deficiency anemia)   . Adhesive capsulitis of right shoulder 03/01/2012  . Iron deficiency anemia 02/02/2012  . Distal radius fracture 11/30/2011  . Diverticulosis 10/20/2010  . Normocytic anemia 10/01/2010  . GERD 10/01/2010  . FECAL OCCULT BLOOD 10/01/2010    Past Surgical History:  Procedure Laterality Date  . ABDOMINAL HYSTERECTOMY    . ANTERIOR AND POSTERIOR REPAIR  05/17/2012   Procedure: ANTERIOR (CYSTOCELE) AND POSTERIOR REPAIR (RECTOCELE);  Surgeon: Jonnie Kind, MD;  Location: [facility];  Service: Gynecology;  Laterality: N/A;  . BIOPSY  11/22/2018   Procedure: BIOPSY;  Surgeon: Daneil Dolin, MD;  [facility]: [facility];  Service: Endoscopy;;  . BIOPSY  02/12/2020   Procedure: BIOPSY;  Surgeon: Daneil Dolin, MD;  [facility]: [facility];  Service: Endoscopy;;  gastric biopsy  . COLONOSCOPY  02/29/2012   Dr. Gala Romney: colonic diverticulosis, minimal internal hemorrhoids, benign polyp  . ESOPHAGOGASTRODUODENOSCOPY  02/29/2012   Dr. Gala Romney: atonic esophagus, moderate-sized hiatal hernia, fundal gland polyps  . ESOPHAGOGASTRODUODENOSCOPY N/A 10/02/2013   Dr. Gala Romney- multiple Wilhemena Durie gastric polyps- not manipulated  . ESOPHAGOGASTRODUODENOSCOPY N/A 01/14/2015   Dr. Gala Romney: Large hiatal hernia, otherwise normal EGD  . ESOPHAGOGASTRODUODENOSCOPY (EGD) WITH PROPOFOL N/A 11/22/2018  Dr. Gala Romney: normal esophagus, large hiatal hernia, erosive gastropathy s/p biopsy, normal duodenum. Reactive gastropathy  . ESOPHAGOGASTRODUODENOSCOPY (EGD) WITH PROPOFOL N/A 02/12/2020   normal esophagus, large hiatal hernia, [name] lesions, benign gastric biopsy  . GIVENS CAPSULE STUDY  02/29/2012   No explanation for IDA. Possible extrinsic compression but negative CT.   Marland Kitchen SALPINGOOPHORECTOMY  05/17/2012   Procedure: SALPINGO  OOPHERECTOMY;  Surgeon: Jonnie Kind, MD;  Location: [facility];  Service: Gynecology;  Laterality: Bilateral;  . VAGINAL HYSTERECTOMY  05/17/2012   Procedure: HYSTERECTOMY VAGINAL;  Surgeon: Jonnie Kind, MD;  Location: [facility];  Service: Gynecology;  Laterality: N/A;     OB History   No obstetric history on file.     Family History  Problem Relation Age of Onset  . Hypertension Mother   . Lymphoma Brother   . Anesthesia problems Neg Hx   . Hypotension Neg Hx   . Malignant hyperthermia Neg Hx   . Pseudochol deficiency Neg Hx   . Colon cancer Neg Hx     Social History   Tobacco Use  . Smoking status: Never Smoker  . Smokeless tobacco: Never Used  . Tobacco comment: Never smoked  Substance Use Topics  . Alcohol use: No    Alcohol/week: 0.0 standard drinks  . Drug use: No    Home Medications Prior to Admission medications   Medication Sig Start Date End Date Taking? Authorizing Provider  CALCIUM PO Take 300-600 mg by mouth.    Yes [provider]  ferrous sulfate (FEROSUL) 325 (65 FE) MG tablet Take 1 tablet (325 mg total) by mouth 2 (two) times daily with a meal. Take after breakfast and dinner 07/02/20  Yes Annitta Needs, NP  lisinopril-hydrochlorothiazide (PRINZIDE,ZESTORETIC) 20-12.5 MG per tablet Take 1 tablet by mouth daily.   Yes [provider]  Multiple Vitamin (MULTIVITAMIN) tablet Take 1 tablet by mouth.    Yes [provider]  pantoprazole (PROTONIX) 40 MG tablet Take 1 tablet (40 mg total) by mouth 2 (two) times daily before a meal. 02/13/20 07/02/20 Yes Shah, Pratik D, DO  sucralfate (CARAFATE) 1 g tablet Take 1 tablet (1 g total) by mouth 4 (four) times daily -  with meals and at bedtime. 12/04/20  Yes Mahala Menghini, PA-C  ondansetron (ZOFRAN) 4 MG tablet Take 1 tablet (4 mg total) by mouth every 8 (eight) hours as needed for nausea or vomiting. Patient not taking: Reported on 12/26/2020 11/25/20   Annitta Needs, NP    Allergies     Sulfa antibiotics and Naproxen sodium  Review of Systems   Review of Systems  Constitutional: Negative for appetite change and fatigue.  HENT: Negative for congestion, ear discharge and sinus pressure.   Eyes: Negative for discharge.  Respiratory: Negative for cough.   Cardiovascular: Negative for chest pain.  Gastrointestinal: Positive for hematochezia. Negative for abdominal pain and diarrhea.       Black stool  Genitourinary: Negative for frequency and hematuria.  Musculoskeletal: Negative for back pain.  Skin: Negative for rash.  Neurological: Negative for seizures and headaches.  Psychiatric/Behavioral: Negative for hallucinations.    Physical Exam Updated Vital Signs BP (!) 123/59   Pulse 85   Temp 97.9 F (36.6 C) (Oral)   Resp 18   Ht 4\' 8"  (1.422 m)   Wt 51.7 kg   SpO2 97%   BMI 25.56 kg/m   Physical Exam Vitals and nursing note reviewed.  Constitutional:  Appearance: She is well-developed.  HENT:     Head: Normocephalic.     Right Ear: Tympanic membrane normal.     Nose: Nose normal.     Mouth/Throat:     Mouth: Mucous membranes are moist.  Eyes:     General: No scleral icterus.    Extraocular Movements: EOM normal.     Conjunctiva/sclera: Conjunctivae normal.  Neck:     Thyroid: No thyromegaly.  Cardiovascular:     Rate and Rhythm: Normal rate and regular rhythm.     Heart sounds: No murmur heard. No friction rub. No gallop.   Pulmonary:     Breath sounds: No stridor. No wheezing or rales.  Chest:     Chest wall: No tenderness.  Abdominal:     General: There is no distension.     Tenderness: There is no abdominal tenderness. There is no rebound.  Genitourinary:    Comments: Rectal exam shows black stool seen Musculoskeletal:        General: No edema. Normal range of motion.     Cervical back: Neck supple.  Lymphadenopathy:     Cervical: No cervical adenopathy.  Skin:    Findings: No erythema or rash.  Neurological:     Mental  Status: She is alert and oriented to person, place, and time.     Motor: No abnormal muscle tone.     Coordination: Coordination normal.  Psychiatric:        Mood and Affect: Mood and affect normal.        Behavior: Behavior normal.     ED Results / Procedures / Treatments   Labs (all labs ordered are listed, but only abnormal results are displayed) Labs Reviewed  CBC WITH DIFFERENTIAL/PLATELET - Abnormal; Notable for the following components:      Result Value   WBC 10.7 (*)    RBC 3.00 (*)    Hemoglobin 9.4 (*)    HCT 30.8 (*)    MCV 102.7 (*)    Neutro Abs 9.2 (*)    All other components within normal limits  COMPREHENSIVE METABOLIC PANEL - Abnormal; Notable for the following components:   Glucose, Bld 115 (*)    BUN 49 (*)    Calcium 8.8 (*)    All other components within normal limits  POC OCCULT BLOOD, ED - Abnormal; Notable for the following components:   Fecal Occult Bld POSITIVE (*)    All other components within normal limits  RESP PANEL BY RT-PCR (FLU A&B, COVID) ARPGX2  OCCULT BLOOD X 1 CARD TO LAB, STOOL  CBC  TYPE AND SCREEN    EKG None  Radiology No results found.  Procedures Procedures   Medications Ordered in ED Medications  pantoprazole (PROTONIX) injection 40 mg (has no administration in time range)  pantoprazole (PROTONIX) injection 40 mg (40 mg Intravenous Given 12/26/20 0858)  sodium chloride 0.9 % bolus 1,000 mL (0 mLs Intravenous Stopped 12/26/20 1020)    ED Course  I have reviewed the triage vital signs and the nursing notes.  Pertinent labs & imaging results that were available during my care of the patient were reviewed by me and considered in my medical decision making (see chart for details).    MDM Rules/Calculators/A&P                          Patient with upper GI bleed probably from worsening gastritis.  I spoke with her GI doctor, Dr.  Rourk and he wants the patient admitted to medicine and GI will consult.  She will be  placed on clear liquids and possibly have another upper endoscopy Final Clinical Impression(s) / ED Diagnoses Final diagnoses:  Upper GI bleed    Rx / DC Orders ED Discharge Orders    None       Milton Ferguson, MD 12/29/20 [id number]    Milton Ferguson, MD 12/29/20 [phone]

## 2020-12-26 NOTE — ED Triage Notes (Signed)
Pt reports black stool that started yesterday. Pt denies dizziness or lightheadedness. Pt states that she has also "been throwing up blood last week but I didn't come to the hospital then."

## 2020-12-26 NOTE — Op Note (Signed)
[facility] Patient Name: Wanda [name] Procedure Date: 12/26/2020 2:04 PM MRN: [id number] Date of Birth: [birth year] Attending MD: Norvel Richards , MD CSN: [id number] Age: 87 Admit Type: Outpatient Procedure:                Upper GI endoscopy Indications:              Melena Providers:                Norvel Richards, MD, Jeanann Lewandowsky. Gwenlyn Perking Therapist, sports, RN,                            Suzan Garibaldi. Risa Grill, Technician Referring MD:              Medicines:                Propofol per Anesthesia Complications:            No immediate complications. Estimated Blood Loss:     Estimated blood loss: none. Procedure:                Pre-Anesthesia Assessment:                           - Prior to the procedure, a History and Physical                            was performed, and patient medications and                            allergies were reviewed. The patient's tolerance of                            previous anesthesia was also reviewed. The risks                            and benefits of the procedure and the sedation                            options and risks were discussed with the patient.                            All questions were answered, and informed consent                            was obtained. Prior Anticoagulants: The patient has                            taken no previous anticoagulant or antiplatelet                            agents. ASA Grade Assessment: III - A patient with                            severe systemic disease. After reviewing the risks  and benefits, the patient was deemed in                            satisfactory condition to undergo the procedure.                           After obtaining informed consent, the endoscope was                            passed under direct vision. Throughout the                            procedure, the patient's blood pressure, pulse, and                            oxygen  saturations were monitored continuously. The                            [id number]-[id number] ([id number]) was introduced through the                            mouth, and advanced to the third part of duodenum.                            The upper GI endoscopy was accomplished without                            difficulty. The patient tolerated the procedure                            well. Scope In: 2:56:22 PM Scope Out: 3:11:49 PM Total Procedure Duration: 0 hours 15 minutes 27 seconds  Findings:      The examined esophagus was normal.      A large hiatal hernia was present. GE junction at 29 cm from incisors;       diaphragmatic hiatus at 40 cm from incisors. Multiple longitudinal       erosions circumferentially involving the gastric mucosa straddling the       diaphragmatic hiatus([name] lesions). Please see photos. Did not see an       ulcer or infiltrating process. No discrete bleeding lesion seen. Pylorus       patent and easily traversed examination the bulb and second portion       revealed a large juxta ampullary duodenal diverticulum. Scope was       withdrawn. The small bowel capsule deployment device was attached to the       scope. Capsule was attached to the deployment device; scope was       reintroduced into the stomach and advanced across the pylorus. The       capsule was launched into the second portion of the duodenum. Impression:               - Normal esophagus.                           - Large hiatal hernia.                           -  Erosive gastropathy ([name] lesions). Possible                            attributing factor to of GI bleeding. Duodenal                            diverticulum..                           - Video capsule placed into the duodenum. Moderate Sedation:      Moderate (conscious) sedation was personally administered by an       anesthesia professional. The following parameters were monitored: oxygen       saturation, heart rate, blood pressure,  respiratory rate, EKG, adequacy       of pulmonary ventilation, and response to care. Recommendation:           - Return patient to hospital ward for observation.                            We will follow up deployment protocol. Further                            recommendations to follow once capsule data can be                            reviewed. Procedure Code(s):        --- Professional ---                           [phone], Esophagogastroduodenoscopy, flexible,                            transoral; diagnostic, including collection of                            specimen(s) by brushing or washing, when performed                            (separate procedure) Diagnosis Code(s):        --- Professional ---                           K44.9, Diaphragmatic hernia without obstruction or                            gangrene                           K31.89, Other diseases of stomach and duodenum                           K92.1, Melena (includes Hematochezia) CPT copyright 2019 American [employer] Association. All rights reserved. The codes documented in this report are preliminary and upon coder review may  be revised to meet current compliance requirements. [name]. Isack Lavalley, MD Norvel Richards, MD 12/26/2020 4:44:47 PM This report has been signed electronically. Number of Addenda: 0

## 2020-12-26 NOTE — Anesthesia Preprocedure Evaluation (Addendum)
Anesthesia Evaluation  Patient identified by MRN, date of birth, ID band Patient awake    Reviewed: Allergy & Precautions, H&P , NPO status , Patient's Chart, lab work & pertinent test results, reviewed documented beta blocker date and time   Airway Mallampati: II  TM Distance: >3 FB Neck ROM: full    Dental no notable dental hx. (+) Dental Advisory Given, Teeth Intact   Pulmonary sleep apnea ,    Pulmonary exam normal breath sounds clear to auscultation       Cardiovascular Exercise Tolerance: Good hypertension, negative cardio ROS   Rhythm:regular Rate:Normal     Neuro/Psych negative neurological ROS  negative psych ROS   GI/Hepatic Neg liver ROS, hiatal hernia, PUD, GERD  Medicated,  Endo/Other  negative endocrine ROS  Renal/GU negative Renal ROS  negative genitourinary   Musculoskeletal negative musculoskeletal ROS (+)   Abdominal   Peds negative pediatric ROS (+)  Hematology  (+) Blood dyscrasia, anemia ,   Anesthesia Other Findings   Reproductive/Obstetrics negative OB ROS                            Anesthesia Physical Anesthesia Plan  ASA: III and emergent  Anesthesia Plan: General   Post-op Pain Management:    Induction:   PONV Risk Score and Plan: Propofol infusion  Airway Management Planned:   Additional Equipment:   Intra-op Plan:   Post-operative Plan:   Informed Consent: I have reviewed the patients History and Physical, chart, labs and discussed the procedure including the risks, benefits and alternatives for the proposed anesthesia with the patient or authorized representative who has indicated his/her understanding and acceptance.     Dental Advisory Given  Plan Discussed with: CRNA  Anesthesia Plan Comments:         Anesthesia Quick Evaluation

## 2020-12-26 NOTE — Plan of Care (Signed)
  Problem: Activity: Goal: Risk for activity intolerance will decrease Outcome: Progressing   

## 2020-12-26 NOTE — Transfer of Care (Signed)
Immediate Anesthesia Transfer of Care Note  Patient: Wanda [name]  Procedure(s) Performed: ESOPHAGOGASTRODUODENOSCOPY (EGD) WITH PROPOFOL (N/A ) GIVENS CAPSULE STUDY (N/A )  Patient Location: PACU  Anesthesia Type:General  Level of Consciousness: drowsy  Airway & Oxygen Therapy: Patient Spontanous Breathing and Patient connected to nasal cannula oxygen  Post-op Assessment: Report given to RN and Post -op Vital signs reviewed and stable  Post vital signs: Reviewed and stable  Last Vitals:  Vitals Value Taken Time  BP    Temp    Pulse 65 12/26/20 1517  Resp 20 12/26/20 1517  SpO2 100 % 12/26/20 1517  Vitals shown include unvalidated device data.  Last Pain:  Vitals:   12/26/20 1451  TempSrc:   PainSc: 0-No pain      Patients Stated Pain Goal: 7 (48/88/91 6945)  Complications: No complications documented.

## 2020-12-26 NOTE — H&P (Signed)
Patient Demographics:    Wanda [name], is a 87 y.o. female  MRN: [id number]   DOB - February 19, 1933  Admit Date - 12/26/2020  Outpatient Primary MD for the patient is Renee Rival, NP   Assessment & Plan:    Active Problems:   Acute GI bleeding    1) acute GI bleed---  Last admitted for GI bleeding back in March 2021. Completed EGD March 2021 with findings of normal esophagus, large hiatal hernia, [name] lesions, benign gastric biopsy. Last colonoscopy in April 2013: Colonic diverticulosis, minimal internal hemorrhoids, benign polyp removed. Small bowel capsule endoscopy April 2013 with no explanation for IDA. - hemoglobin is above 8, which is close to her very recent baseline but please note patient's hemoglobin back on 07/29/2020 was 12.1 --GI consult appreciated, EGD with possible capsule deployment planned for 12/26/2020 -May need outpatient colonoscopy -Patient may need surgical fixation of a large hiatal hernia -Continue IV Protonix -serial H&H needed may need transfusion   2)HTN--hold lisinopril HCTZ at this time, may use Advil hydralazine as needed elevated BP  3) B12 and iron deficiency anemia--- resume monthly B12 injections, hold iron tablets for now  4) dysphagia--- patient specifically notes dysphagia with solid food, large pills and occasionally liquids  --suspect due to large hiatal hernia, the plan for 12/26/2020   Disposition/Need for in-Hospital Stay- patient unable to be discharged at this time due to --- ongoing GI bleeding requiring further evaluation including EGD, serial H&H needed may need transfusion   Dispo: The patient is from: Home              Anticipated d/c is to: Home              Anticipated d/c date is: 2 days              Patient currently is not medically stable  to d/c. Barriers: Not Clinically Stable-    With History of - Reviewed by me  Past Medical History:  Diagnosis Date  . Anemia   . Cystocele   . Diverticulosis 10/20/10   Left-sided on colonoscopy by Dr. Gala Romney 10/20/10  . Gastric polyps 10/02/2013  . Gastritis 12/08/10   EGD by Dr. Altha Harm hiatal hernia, duodenal diverticulum, chronic gastritis  . GERD (gastroesophageal reflux disease)   . Heart murmur   . Hiatal hernia 10/02/2013  . HTN (hypertension)   . IDA (iron deficiency anemia)   . 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 Osteoarthritis   . Radial fracture    (right) Undergoing treatment by Dr. Aline Brochure currently  . Sleep apnea    STOP BANG SCORE 4  . Urge incontinence   . Vitamin D deficiency       Past Surgical History:  Procedure Laterality Date  . ABDOMINAL HYSTERECTOMY    . ANTERIOR AND POSTERIOR REPAIR  05/17/2012   Procedure: ANTERIOR (CYSTOCELE) AND POSTERIOR REPAIR (RECTOCELE);  Surgeon: Jonnie Kind, MD;  Location: [facility];  Service: Gynecology;  Laterality: N/A;  . BIOPSY  11/22/2018   Procedure: BIOPSY;  Surgeon: Daneil Dolin, MD;  Location: [facility];  Service: Endoscopy;;  . BIOPSY  02/12/2020   Procedure: BIOPSY;  Surgeon: Daneil Dolin, MD;  Location: [facility];  Service: Endoscopy;;  gastric biopsy  . COLONOSCOPY  02/29/2012   Dr. Gala Romney: colonic diverticulosis, minimal internal hemorrhoids, benign polyp  . ESOPHAGOGASTRODUODENOSCOPY  02/29/2012   Dr. Gala Romney: atonic esophagus, moderate-sized hiatal hernia, fundal gland polyps  . ESOPHAGOGASTRODUODENOSCOPY N/A 10/02/2013   Dr. Gala Romney- multiple Wilhemena Durie gastric polyps- not manipulated  . ESOPHAGOGASTRODUODENOSCOPY N/A 01/14/2015   Dr. Gala Romney: Large hiatal hernia, otherwise normal EGD  . ESOPHAGOGASTRODUODENOSCOPY (EGD) WITH PROPOFOL N/A 11/22/2018   Dr. Gala Romney: normal esophagus, large hiatal hernia, erosive gastropathy s/p biopsy, normal duodenum. Reactive gastropathy  .  ESOPHAGOGASTRODUODENOSCOPY (EGD) WITH PROPOFOL N/A 02/12/2020   normal esophagus, large hiatal hernia, [name] lesions, benign gastric biopsy  . GIVENS CAPSULE STUDY  02/29/2012   No explanation for IDA. Possible extrinsic compression but negative CT.   Marland Kitchen SALPINGOOPHORECTOMY  05/17/2012   Procedure: SALPINGO OOPHERECTOMY;  Surgeon: Jonnie Kind, MD;  Location: [facility];  Service: Gynecology;  Laterality: Bilateral;  . VAGINAL HYSTERECTOMY  05/17/2012   Procedure: HYSTERECTOMY VAGINAL;  Surgeon: Jonnie Kind, MD;  Location: [facility];  Service: Gynecology;  Laterality: N/A;      Chief Complaint  Patient presents with  . Melena      HPI:    Wanda [name]  is a 87 y.o. female with medical history significant ofgastritis, known [name] erosions of the esophagus 2/2 GERD/large hiatal hernia,, iron deficiency and B12 deficiency anemia with recurrent PRBC and iron infusion presents to the ED with concerns for dark stools/Elinor and episodes of hematemesis on and off over the last couple of weeks - Endorses nausea and epigastric discomfort, no further emesis today -In the ED hemoglobin is above 8, which is close to her very recent baseline but please note patient's hemoglobin back on 07/29/2020 was 12.1 -Patient takes iron pills, denies aspirin or NSAIDs, she has been compliant with Protonix -Prior colonoscopy with colonic diverticulosis and minimal internal hemorrhoids and benign polyps, removed - -GI service saw patient and recommended EGD be done on 12/27/2019 with possible capsule study   Review of systems:    In addition to the HPI above,   A full Review of  Systems was done, all other systems reviewed are negative except as noted above in HPI , .    Social History:  Reviewed by me    Social History   Tobacco Use  . Smoking status: Never Smoker  . Smokeless tobacco: Never Used  . Tobacco comment: Never smoked  Substance Use Topics  . Alcohol use: No    Alcohol/week: 0.0  standard drinks       Family History :  Reviewed by me    Family History  Problem Relation Age of Onset  . Hypertension Mother   . Lymphoma Brother   . Anesthesia problems Neg Hx   . Hypotension Neg Hx   . Malignant hyperthermia Neg Hx   . Pseudochol deficiency Neg Hx   . Colon cancer Neg Hx      Home Medications:   Prior to Admission medications   Medication Sig Start Date End Date Taking? Authorizing Provider  CALCIUM PO Take 300-600 mg by mouth.    Yes [provider]  ferrous sulfate (FEROSUL) 325 (65  FE) MG tablet Take 1 tablet (325 mg total) by mouth 2 (two) times daily with a meal. Take after breakfast and dinner 07/02/20  Yes Annitta Needs, NP  lisinopril-hydrochlorothiazide (PRINZIDE,ZESTORETIC) 20-12.5 MG per tablet Take 1 tablet by mouth daily.   Yes [provider]  Multiple Vitamin (MULTIVITAMIN) tablet Take 1 tablet by mouth.    Yes [provider]  pantoprazole (PROTONIX) 40 MG tablet Take 1 tablet (40 mg total) by mouth 2 (two) times daily before a meal. 02/13/20 07/02/20 Yes Shah, Pratik D, DO  sucralfate (CARAFATE) 1 g tablet Take 1 tablet (1 g total) by mouth 4 (four) times daily -  with meals and at bedtime. 12/04/20  Yes Mahala Menghini, PA-C  ondansetron (ZOFRAN) 4 MG tablet Take 1 tablet (4 mg total) by mouth every 8 (eight) hours as needed for nausea or vomiting. Patient not taking: Reported on 12/26/2020 11/25/20   Annitta Needs, NP     Allergies:     Allergies  Allergen Reactions  . Sulfa Antibiotics     unknown  . Naproxen Sodium Rash and Other (See Comments)    Passed out     Physical Exam:   Vitals  Blood pressure 138/74, pulse 73, temperature 99.4 F (37.4 C), temperature source Oral, resp. rate 17, height 4\' 8"  (1.422 m), weight 51.7 kg, SpO2 100 %.  Physical Examination: General appearance - alert, and in no distress  Mental status - alert, oriented to person, place, and time,  Eyes - sclera anicteric Neck -  supple, no JVD elevation , Chest - clear  to auscultation bilaterally, symmetrical air movement,  Heart - S1 and S2 normal, regular  Abdomen - soft,  nondistended, mild epigastric discomfort with no rebound or guarding Neurological - screening mental status exam normal, neck supple without rigidity, cranial nerves II through XII intact, DTR's normal and symmetric Extremities - no pedal edema noted, intact peripheral pulses  Skin - warm, dry     Data Review:    CBC Recent Labs  Lab 12/26/20 0834  WBC 10.7*  HGB 9.4*  HCT 30.8*  PLT 323  MCV 102.7*  MCH 31.3  MCHC 30.5  RDW 12.7  LYMPHSABS 1.0  MONOABS 0.4  EOSABS 0.0  BASOSABS 0.1   ------------------------------------------------------------------------------------------------------------------  Chemistries  Recent Labs  Lab 12/26/20 0834  NA 138  K 4.1  CL 107  CO2 23  GLUCOSE 115*  BUN 49*  CREATININE 0.69  CALCIUM 8.8*  AST 27  ALT 23  ALKPHOS 76  BILITOT 1.1   ------------------------------------------------------------------------------------------------------------------ estimated creatinine clearance is 34.5 mL/min (by C-G formula based on SCr of 0.69 mg/dL). ------------------------------------------------------------------------------------------------------------------ No results for input(s): TSH, T4TOTAL, T3FREE, THYROIDAB in the last 72 hours.  Invalid input(s): FREET3   Coagulation profile No results for input(s): INR, PROTIME in the last 168 hours. ------------------------------------------------------------------------------------------------------------------- No results for input(s): DDIMER in the last 72 hours. -------------------------------------------------------------------------------------------------------------------  Cardiac Enzymes No results for input(s): CKMB, TROPONINI, MYOGLOBIN in the last 168 hours.  Invalid input(s):  CK ------------------------------------------------------------------------------------------------------------------ No results found for: BNP   ---------------------------------------------------------------------------------------------------------------  Urinalysis    Component Value Date/Time   COLORURINE YELLOW 11/05/2018 [place] 11/05/2018 1029   LABSPEC 1.008 11/05/2018 1029   PHURINE 8.0 11/05/2018 1029   GLUCOSEU NEGATIVE 11/05/2018 1029   HGBUR NEGATIVE 11/05/2018 1029   BILIRUBINUR NEGATIVE 11/05/2018 1029   KETONESUR NEGATIVE 11/05/2018 1029   PROTEINUR NEGATIVE 11/05/2018 1029   UROBILINOGEN 0.2 05/11/2012 1000  NITRITE NEGATIVE 11/05/2018 [place] 11/05/2018 1029    ----------------------------------------------------------------------------------------------------------------   Imaging Results:    No results found.  Radiological Exams on Admission: No results found.  DVT Prophylaxis -SCD   AM Labs Ordered, also please review Full Orders  Family Communication: Admission, patients condition and plan of care including tests being ordered have been discussed with the patient who indicate understanding and agree with the plan   Code Status - Full Code  Likely DC to  Home if H&H stable and patient is hemodynamically stable  Condition   stable  Roxan Hockey M.D on 12/26/2020 at 2:27 PM Go to [email or web address] -  for contact info  [facility] - Office  [phone]

## 2020-12-26 NOTE — Anesthesia Procedure Notes (Signed)
Date/Time: 12/26/2020 3:00 PM Performed by: Orlie Dakin, CRNA Pre-anesthesia Checklist: Patient identified, Emergency Drugs available, Suction available and Patient being monitored Patient Re-evaluated:Patient Re-evaluated prior to induction Oxygen Delivery Method: Nasal cannula Induction Type: IV induction Placement Confirmation: positive ETCO2

## 2020-12-26 NOTE — Consult Note (Signed)
Referring Provider: Milton Ferguson, MD Primary Care Physician:  Renee Rival, NP Primary Gastroenterologist:  Garfield Cornea, MD  Reason for Consultation:  Fabienne Bruns bleed  HPI: Wanda [name] is a 87 y.o. female with history of IDA/B12 deficiency previously followed by hematology, large hiatal hernia with Lysbeth Galas lesions, presenting for further evaluation of melena and hematemesis.    Patient states she had been doing very well until few weeks ago. She had several episodes of coffee ground emesis and black stool. She was reluctant to go to the ED at the time. She completed labs at onset of symptoms on 1/10, Hgb 9.3. Repeat Hgb on 1/13 was 8.6 and on 1/20 was 8.7.  July 01, 2020 her hemoglobin was 9.8.  On September 13 her hemoglobin was 12.1.   Symptoms resolved and she was feeling well until she woke up at 2am this morning and had abdominal pain followed by a black stool. She has had 5 black stools total. +nausea but no vomiting with this episode. No abdominal pain at this time. No brbpr.  At baseline her heartburn is controlled.  No constipation or diarrhea.  She has difficulty swallowing large pills, feels them stick in her upper chest.  This happens at times with liquids and solids and she has to "burp it up".  She complains of chronic fatigue, has no energy.  She denies aspirin or NSAIDs.  She takes ferrous sulfate 325 mg twice daily.  She is on pantoprazole 40 mg twice daily.  Received iron infusions back in March 2021. Has not been seen by hematology in nearly a year, states she stopped going because we were following her for her anemia.  She cannot recall the last time she received her monthly B12 injection, stating that no one would refill it for her.  Last admitted for GI bleeding back in March 2021. Completed EGD March 2021 with findings of normal esophagus, large hiatal hernia, [name] lesions, benign gastric biopsy.  Last colonoscopy in April 2013: Colonic diverticulosis, minimal  internal hemorrhoids, benign polyp removed.  Small bowel capsule endoscopy April 2013 with no explanation for IDA.  Prior to Admission medications   Medication Sig Start Date End Date Taking? Authorizing Provider  CALCIUM PO Take 300-600 mg by mouth.    Yes [provider]  ferrous sulfate (FEROSUL) 325 (65 FE) MG tablet Take 1 tablet (325 mg total) by mouth 2 (two) times daily with a meal. Take after breakfast and dinner 07/02/20  Yes Annitta Needs, NP  lisinopril-hydrochlorothiazide (PRINZIDE,ZESTORETIC) 20-12.5 MG per tablet Take 1 tablet by mouth daily.   Yes [provider]  Multiple Vitamin (MULTIVITAMIN) tablet Take 1 tablet by mouth.    Yes [provider]  pantoprazole (PROTONIX) 40 MG tablet Take 1 tablet (40 mg total) by mouth 2 (two) times daily before a meal. 02/13/20 07/02/20 Yes Shah, Pratik D, DO  sucralfate (CARAFATE) 1 g tablet Take 1 tablet (1 g total) by mouth 4 (four) times daily -  with meals and at bedtime. 12/04/20  Yes Mahala Menghini, PA-C  ondansetron (ZOFRAN) 4 MG tablet Take 1 tablet (4 mg total) by mouth every 8 (eight) hours as needed for nausea or vomiting. Patient not taking: Reported on 12/26/2020 11/25/20   Annitta Needs, NP    No current facility-administered medications for this encounter.   Current Outpatient Medications  Medication Sig Dispense Refill  . CALCIUM PO Take 300-600 mg by mouth.     . ferrous sulfate (FEROSUL)  325 (65 FE) MG tablet Take 1 tablet (325 mg total) by mouth 2 (two) times daily with a meal. Take after breakfast and dinner 60 tablet 2  . lisinopril-hydrochlorothiazide (PRINZIDE,ZESTORETIC) 20-12.5 MG per tablet Take 1 tablet by mouth daily.    . Multiple Vitamin (MULTIVITAMIN) tablet Take 1 tablet by mouth.     . pantoprazole (PROTONIX) 40 MG tablet Take 1 tablet (40 mg total) by mouth 2 (two) times daily before a meal. 60 tablet 2  . sucralfate (CARAFATE) 1 g tablet Take 1 tablet (1 g total) by mouth 4 (four)  times daily -  with meals and at bedtime. 120 tablet 1  . ondansetron (ZOFRAN) 4 MG tablet Take 1 tablet (4 mg total) by mouth every 8 (eight) hours as needed for nausea or vomiting. (Patient not taking: Reported on 12/26/2020) 30 tablet 1    Allergies as of 12/26/2020 - Review Complete 12/26/2020  Allergen Reaction Noted  . Sulfa antibiotics  06/23/2016  . Naproxen sodium Rash and Other (See Comments)     Past Medical History:  Diagnosis Date  . Anemia   . Cystocele   . Diverticulosis 10/20/10   Left-sided on colonoscopy by Dr. Gala Romney 10/20/10  . Gastric polyps 10/02/2013  . Gastritis 12/08/10   EGD by Dr. Altha Harm hiatal hernia, duodenal diverticulum, chronic gastritis  . GERD (gastroesophageal reflux disease)   . Heart murmur   . Hiatal hernia 10/02/2013  . HTN (hypertension)   . IDA (iron deficiency anemia)   . 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 Osteoarthritis   . Radial fracture    (right) Undergoing treatment by Dr. Aline Brochure currently  . Sleep apnea    STOP BANG SCORE 4  . Urge incontinence   . Vitamin D deficiency     Past Surgical History:  Procedure Laterality Date  . ABDOMINAL HYSTERECTOMY    . ANTERIOR AND POSTERIOR REPAIR  05/17/2012   Procedure: ANTERIOR (CYSTOCELE) AND POSTERIOR REPAIR (RECTOCELE);  Surgeon: Jonnie Kind, MD;  Location: [facility];  Service: Gynecology;  Laterality: N/A;  . BIOPSY  11/22/2018   Procedure: BIOPSY;  Surgeon: Daneil Dolin, MD;  Location: [facility];  Service: Endoscopy;;  . BIOPSY  02/12/2020   Procedure: BIOPSY;  Surgeon: Daneil Dolin, MD;  Location: [facility];  Service: Endoscopy;;  gastric biopsy  . COLONOSCOPY  02/29/2012   Dr. Gala Romney: colonic diverticulosis, minimal internal hemorrhoids, benign polyp  . ESOPHAGOGASTRODUODENOSCOPY  02/29/2012   Dr. Gala Romney: atonic esophagus, moderate-sized hiatal hernia, fundal gland polyps  . ESOPHAGOGASTRODUODENOSCOPY N/A 10/02/2013   Dr. Gala Romney- multiple Wilhemena Durie  gastric polyps- not manipulated  . ESOPHAGOGASTRODUODENOSCOPY N/A 01/14/2015   Dr. Gala Romney: Large hiatal hernia, otherwise normal EGD  . ESOPHAGOGASTRODUODENOSCOPY (EGD) WITH PROPOFOL N/A 11/22/2018   Dr. Gala Romney: normal esophagus, large hiatal hernia, erosive gastropathy s/p biopsy, normal duodenum. Reactive gastropathy  . ESOPHAGOGASTRODUODENOSCOPY (EGD) WITH PROPOFOL N/A 02/12/2020   normal esophagus, large hiatal hernia, [name] lesions, benign gastric biopsy  . GIVENS CAPSULE STUDY  02/29/2012   No explanation for IDA. Possible extrinsic compression but negative CT.   Marland Kitchen SALPINGOOPHORECTOMY  05/17/2012   Procedure: SALPINGO OOPHERECTOMY;  Surgeon: Jonnie Kind, MD;  Location: [facility];  Service: Gynecology;  Laterality: Bilateral;  . VAGINAL HYSTERECTOMY  05/17/2012   Procedure: HYSTERECTOMY VAGINAL;  Surgeon: Jonnie Kind, MD;  Location: [facility];  Service: Gynecology;  Laterality: N/A;    Family History  Problem Relation Age of Onset  . Hypertension Mother   .  Lymphoma Brother   . Anesthesia problems Neg Hx   . Hypotension Neg Hx   . Malignant hyperthermia Neg Hx   . Pseudochol deficiency Neg Hx   . Colon cancer Neg Hx     Social History   Socioeconomic History  . Marital status: Widowed    Spouse name: Not on file  . Number of children: Not on file  . Years of education: Not on file  . Highest education level: Not on file  Occupational History  . Occupation: Retired    Fish farm manager: RETIRED  Tobacco Use  . Smoking status: Never Smoker  . Smokeless tobacco: Never Used  . Tobacco comment: Never smoked  Substance and Sexual Activity  . Alcohol use: No    Alcohol/week: 0.0 standard drinks  . Drug use: No  . Sexual activity: Not Currently    Birth control/protection: Surgical  Other Topics Concern  . Not on file  Social History Narrative  . Not on file   Social Determinants of Health   Financial Resource Strain: Not on file  Food Insecurity: Not on file  Transportation  Needs: Not on file  Physical Activity: Not on file  Stress: Not on file  Social Connections: Not on file  Intimate Partner Violence: Not on file     ROS:  General: Negative for anorexia,  , fever, chills, ++fatigue,++ weakness. She reports 25 pound weight loss over several years but has been stable for months. Eyes: Negative for vision changes.  ENT: Negative for hoarseness,   nasal congestion. See hpi CV: Negative for chest pain, angina, palpitations, dyspnea on exertion, peripheral edema.  Respiratory: Negative for dyspnea at rest, dyspnea on exertion, cough, sputum, wheezing.  GI: See history of present illness. GU:  Negative for dysuria, hematuria, urinary incontinence, urinary frequency, nocturnal urination.  MS: Negative for joint pain, low back pain.  Derm: Negative for rash or itching.  Neuro: Negative for weakness, abnormal sensation, seizure, frequent headaches, memory loss, confusion.  Psych: Negative for anxiety, depression, suicidal ideation, hallucinations.  Endo: Negative for unusual weight change.  Heme: Negative for bruising or bleeding. Allergy: Negative for rash or hives.       Physical Examination: Vital signs in last 24 hours: Temp:  [97.9 F (36.6 C)] 97.9 F (36.6 C) (02/10 0756) Pulse Rate:  [79-96] 87 (02/10 1030) Resp:  [18-20] 20 (02/10 1030) BP: (112-153)/(64-82) 120/71 (02/10 1030) SpO2:  [97 %-100 %] 100 % (02/10 1030) Weight:  [51.7 kg] 51.7 kg (02/10 0750)    General: Well-nourished, well-developed in no acute distress.  Son at bedside, Abbe Amsterdam Head: Normocephalic, atraumatic.   Eyes: Conjunctiva pink, no icterus. Mouth: masked Neck: Supple without thyromegaly, masses, or lymphadenopathy.  Lungs: Clear to auscultation bilaterally.  Heart: Regular rate and rhythm, no murmurs rubs or gallops.  Abdomen: Bowel sounds are normal, nontender, nondistended, no hepatosplenomegaly or masses, no abdominal bruits or    hernia , no rebound or guarding.    Rectal: not performed Extremities: No lower extremity edema, clubbing, deformity.  Neuro: Alert and oriented x 4 , grossly normal neurologically.  Skin: Warm and dry, no rash or jaundice.   Psych: Alert and cooperative, normal mood and affect.        Intake/Output from previous day: No intake/output data recorded. Intake/Output this shift: Total I/O In: 1000 [IV Piggyback:1000] Out: -   Lab Results: CBC Recent Labs    12/26/20 0834  WBC 10.7*  HGB 9.4*  HCT 30.8*  MCV 102.7*  PLT  323   BMET Recent Labs    12/26/20 0834  NA 138  K 4.1  CL 107  CO2 23  GLUCOSE 115*  BUN 49*  CREATININE 0.69  CALCIUM 8.8*   LFT Recent Labs    12/26/20 0834  BILITOT 1.1  ALKPHOS 76  AST 27  ALT 23  PROT 6.7  ALBUMIN 3.7    Lipase No results for input(s): LIPASE in the last 72 hours.  PT/INR No results for input(s): LABPROT, INR in the last 72 hours.    Imaging Studies: No results found.Minnie.Brome week]   Impression: 87 y/o female with history of IDA/B12 deficiency previously followed by hematology, large hiatal hernia with Lysbeth Galas lesions, recurrent hematemesis/melena presenting with melena.   GI bleed: Last admission for GI bleeding in 01/2020. She completed EGD at that time showeing normal esophagus, large hiatal hernia, [name] lesions, benign gastric biopsy. Last small bowel capsule and colonoscopy in 2013. She has refrained from ASA/NSAID use and has maintained PPI BID. She had coffee ground emesis several weeks ago also associated with melena at that time. Hgb 12.1 07/2020, Hgb 9.3 11/25/20, Hgb 8.6 11/28/20, Hgb 8.7 12/05/20. Five episodes of black stools this morning. Presenting Hgb 9.4. suspect GI bleeding due to large hiatal hernia but need to consider small bowel source. Patient has not been interested in pursuing hiatal hernia repair in the past.   Dysphagia: She also complains of dysphagia to solids, liquids and large pills. May be secondary to large hiatal hernia but  will evaluate at time of EGD.    IDA: Last iron infusion 01/2020. Stopped seeing hematology since we have been following her anemia. Will update iron/tibc/ferritin.  B12 def: No longer receiving B12 injections since she ran out months ago. Will update labs.   COVID negative this admission.  Plan: 1. NPO 2. Protonix infusion.  3. Iron/tibc/ferritin/b12 4. EGD+/-ED with possible capsule endoscopy today.   We would like to thank you for the opportunity to participate in the care of MARSI TURVEY.  Laureen Ochs. Bernarda Caffey [facility] [phone] 2/10/202212:33 PM     LOS: 0 days

## 2020-12-27 ENCOUNTER — Telehealth: Payer: Self-pay | Admitting: Nurse Practitioner

## 2020-12-27 DIAGNOSIS — K921 Melena: Secondary | ICD-10-CM | POA: Diagnosis not present

## 2020-12-27 DIAGNOSIS — D509 Iron deficiency anemia, unspecified: Secondary | ICD-10-CM | POA: Diagnosis not present

## 2020-12-27 DIAGNOSIS — K922 Gastrointestinal hemorrhage, unspecified: Secondary | ICD-10-CM | POA: Diagnosis not present

## 2020-12-27 LAB — BASIC METABOLIC PANEL
Anion gap: 8 (ref 5–15)
BUN: 29 mg/dL — ABNORMAL HIGH (ref 8–23)
CO2: 23 mmol/L (ref 22–32)
Calcium: 8.5 mg/dL — ABNORMAL LOW (ref 8.9–10.3)
Chloride: 110 mmol/L (ref 98–111)
Creatinine, Ser: 0.62 mg/dL (ref 0.44–1.00)
GFR, Estimated: >60 mL/min (ref >60)
Glucose, Bld: 91 mg/dL (ref 70–99)
Potassium: 3.3 mmol/L — ABNORMAL LOW (ref 3.5–5.1)
Sodium: 141 mmol/L (ref 135–145)

## 2020-12-27 LAB — PREPARE RBC (CROSSMATCH)

## 2020-12-27 LAB — CBC
HCT: 22.9 % — ABNORMAL LOW (ref 36.0–46.0)
Hemoglobin: 7.1 g/dL — ABNORMAL LOW (ref 12.0–15.0)
MCH: 31.3 pg (ref 26.0–34.0)
MCHC: 31 g/dL (ref 30.0–36.0)
MCV: 100.9 fL — ABNORMAL HIGH (ref 80.0–100.0)
Platelets: 262 10*3/uL (ref 150–400)
RBC: 2.27 MIL/uL — ABNORMAL LOW (ref 3.87–5.11)
RDW: 12.9 % (ref 11.5–15.5)
WBC: 6 10*3/uL (ref 4.0–10.5)
nRBC: 0 % (ref 0.0–0.2)

## 2020-12-27 MED ORDER — PANTOPRAZOLE SODIUM 40 MG PO TBEC: 40.0000 mg | DELAYED_RELEASE_TABLET | Freq: Two times a day (BID) | ORAL | 3 refills | Status: DC

## 2020-12-27 MED ORDER — POTASSIUM CHLORIDE CRYS ER 20 MEQ PO TBCR
40.0000 meq | EXTENDED_RELEASE_TABLET | Freq: Once | ORAL | Status: AC
  Administered 2020-12-27: 40 meq via ORAL
  Filled 2020-12-27: qty 2

## 2020-12-27 MED ORDER — FERROUS SULFATE 325 (65 FE) MG PO TABS: 325.0000 mg | ORAL_TABLET | Freq: Two times a day (BID) | ORAL | 2 refills | Status: DC

## 2020-12-27 MED ORDER — SUCRALFATE 1 G PO TABS: 1.0000 g | ORAL_TABLET | Freq: Three times a day (TID) | ORAL | 1 refills | Status: DC

## 2020-12-27 MED ORDER — SODIUM CHLORIDE 0.9% IV SOLUTION: Freq: Once | INTRAVENOUS | Status: AC

## 2020-12-27 MED ORDER — ACETAMINOPHEN 325 MG PO TABS: 650.0000 mg | ORAL_TABLET | Freq: Four times a day (QID) | ORAL | 0 refills | Status: DC | PRN

## 2020-12-27 MED ORDER — ONDANSETRON HCL 4 MG PO TABS: 4.0000 mg | ORAL_TABLET | Freq: Three times a day (TID) | ORAL | 1 refills | Status: DC | PRN

## 2020-12-27 NOTE — Progress Notes (Signed)
Nsg Discharge Note  Admit Date:  12/26/2020 Discharge date: 12/27/2020   Carlynn Herald to be D/C'd per MD order.  AVS completed.  CPatient/caregiver able to verbalize understanding.  Discharge Medication: Allergies as of 12/27/2020      Reactions   Sulfa Antibiotics    unknown   Naproxen Sodium Rash, Other (See Comments)   Passed out      Medication List    STOP taking these medications   lisinopril-hydrochlorothiazide 20-12.5 MG tablet Commonly known as: ZESTORETIC     TAKE these medications   acetaminophen 325 MG tablet Commonly known as: TYLENOL Take 2 tablets (650 mg total) by mouth every 6 (six) hours as needed for mild pain (or Fever >/= 101).   CALCIUM PO Take 300-600 mg by mouth.   ferrous sulfate 325 (65 FE) MG tablet Commonly known as: FeroSul Take 1 tablet (325 mg total) by mouth 2 (two) times daily with a meal. Take after breakfast and dinner   multivitamin tablet Take 1 tablet by mouth.   ondansetron 4 MG tablet Commonly known as: ZOFRAN Take 1 tablet (4 mg total) by mouth every 8 (eight) hours as needed for nausea or vomiting.   pantoprazole 40 MG tablet Commonly known as: Protonix Take 1 tablet (40 mg total) by mouth 2 (two) times daily. What changed: when to take this   sucralfate 1 g tablet Commonly known as: Carafate Take 1 tablet (1 g total) by mouth 4 (four) times daily -  with meals and at bedtime.       Discharge Assessment: Vitals:   12/27/20 1152 12/27/20 1500  BP: (!) 108/59 (!) 156/78  Pulse: 86 87  Resp: 18 18  Temp: 98.1 F (36.7 C) 98 F (36.7 C)  SpO2: 99% 99%   Skin clean, dry and intact without evidence of skin break down, no evidence of skin tears noted. IV catheter discontinued intact. Site without signs and symptoms of complications - no redness or edema noted at insertion site, patient denies c/o pain - only slight tenderness at site.  Dressing with slight pressure applied.  D/c Instructions-Education: Discharge  instructions given to patient/family with verbalized understanding. D/c education completed with patient/family including follow up instructions, medication list, d/c activities limitations if indicated, with other d/c instructions as indicated by MD - patient able to verbalize understanding, all questions fully answered. Patient instructed to return to ED, call 911, or call MD for any changes in condition.  Patient escorted via [place], and D/C home via private auto.  Berton Bon, RN 12/27/2020 4:31 PM

## 2020-12-27 NOTE — Progress Notes (Addendum)
Quick preliminary Givens Capsule read with some dark-tinged pigmented effluent in the small bowel lumen, no obvious active bleeding noted.   Discussed with Dr. Gala Romney and Dr. Denton Brick. Plans to d/c patient to home today. Full capsule read/report to follow. Will see patient in our office in 4 weeks. ER precautions given to the patient.  Thank you for allowing [place] to participate in the care of [place], DNP, AGNP-C Adult & Gerontological Nurse Practitioner [facility]  Attending note: Agree with above assessment.  Pertinent capsule images reviewed.  Capsule did make it to the cecum.  From a GI standpoint, patient can be discharged on Carafate and twice daily Protonix later today once capsule images have been captured.  We will arrange outpatient follow-up in our office in the coming weeks.

## 2020-12-27 NOTE — Telephone Encounter (Signed)
Please schedule 4 week hospital follow-up with any APP

## 2020-12-27 NOTE — Discharge Instructions (Signed)
1)Avoid ibuprofen/Advil/Aleve/Motrin/Goody Powders/Naproxen/BC powders/Meloxicam/Diclofenac/Indomethacin and other Nonsteroidal anti-inflammatory medications as these will make you more likely to bleed and can cause stomach ulcers, can also cause Kidney problems.   2)Please follow up with Dr Gala Romney --gastroenterologist as advised by the GI team  3) please note that there has been some changes to your medications specifically please stop taking lisinopril HCTZ for now as the blood pressure is running somewhat low, please start Protonix 40 mg twice a day and please restart Carafate as prescribed  4) please go to your doctor and get B12 injections every month , also continue iron pills

## 2020-12-27 NOTE — Progress Notes (Signed)
Subjective: Patient happy to be likely going home today. Nursing hanging unit of blood at the time of my visit. Denies abdominal pain, N/V. No overt GI complaints. Discussed "quick read" impressions of present melena, no obvious active bleeding.   Objective: Vital signs in last 24 hours: Temp:  [97.4 F (36.3 C)-99.4 F (37.4 C)] 98.9 F (37.2 C) (02/11 0625) Pulse Rate:  [54-87] 74 (02/11 0625) Resp:  [16-20] 18 (02/11 0625) BP: (89-167)/(45-77) 114/59 (02/11 0625) SpO2:  [96 %-100 %] 96 % (02/11 0625)   General:   Alert and oriented, pleasant Head:  Normocephalic and atraumatic. Eyes:  No icterus, sclera clear. Conjuctiva pink.  Heart:  S1, S2 present, no murmurs noted.  Lungs: Clear to auscultation bilaterally, without wheezing, rales, or rhonchi.  Abdomen:  Bowel sounds present, soft, non-tender, non-distended. No HSM or hernias noted. No rebound or guarding. Msk:  Symmetrical without gross deformities. Pulses:  Normal bilateral DP pulses noted. Extremities:  Without clubbing or edema. Neurologic:  Alert and  oriented x4;  grossly normal neurologically. Psych:  Alert and cooperative. Normal mood and affect.  Intake/Output from previous day: 02/10 0701 - 02/11 0700 In: 1620 [I.V.:620; IV Piggyback:1000] Out: -  Intake/Output this shift: No intake/output data recorded.  Lab Results: Recent Labs    12/26/20 0834 12/26/20 2002 12/27/20 0608  WBC 10.7* 7.8 6.0  HGB 9.4* 7.1* 7.1*  HCT 30.8* 22.9* 22.9*  PLT 323 277 262   BMET Recent Labs    12/26/20 0834 12/27/20 0608  NA 138 141  K 4.1 3.3*  CL 107 110  CO2 23 23  GLUCOSE 115* 91  BUN 49* 29*  CREATININE 0.69 0.62  CALCIUM 8.8* 8.5*   LFT Recent Labs    12/26/20 0834  PROT 6.7  ALBUMIN 3.7  AST 27  ALT 23  ALKPHOS 76  BILITOT 1.1   PT/INR No results for input(s): LABPROT, INR in the last 72 hours. Hepatitis Panel No results for input(s): HEPBSAG, HCVAB, HEPAIGM, HEPBIGM in the last 72  hours.   Studies/Results: No results found.  Assessment: 87 y/o female with history of IDA/B12 deficiency previously followed by hematology, large hiatal hernia with Lysbeth Galas lesions, recurrent hematemesis/melena presenting with melena.   GI bleed: Last admission for GI bleeding in 01/2020. She completed EGD at that time with large hiatal hernia, [name] lesions, benign gastric biopsy. Last small bowel capsule and colonoscopy in 2013. Denies ASA/NSAID use and has maintained PPI BID. She had coffee ground emesis several weeks ago also associated with melena at that time. Hgb 12.1 (07/2020) -> 9.3 (11/25/20) -> 8.6 (11/28/20) -> 8.7 (12/05/20). Five episodes of black stools yesterday morning with presenting Hgb 9.4. Patient has not been interested in pursuing hiatal hernia repair in the past. EGD yesterday with normal esophagus, large hiatal hernia, cameron lesions possibly contributing to findings; a Givens capsule was placed directly into the duodenum. Hgb stable today (7.1).  Dysphagia: She also complains of dysphagia to solids, liquids and large pills likely secondary to large hiatal hernia. EGD without identified esophageal stricture, web, or ring.   IDA: Last iron infusion 01/2020. Stopped seeing hematology since we have been following her anemia. Iron panel normal; ferritin normal but declined compared to 5 months ago.  B12 def: No longer receiving B12 injections since she ran out months ago. B12 normal but declined compared to 12 months ago.   Plan: 1. Monitor hgb 2. Transfuse as necessary 3. Will plan to read Given's capsule study  today 4. Further recommendations to follow 5. Supportive measures    LOS: 0 days    12/27/2020, 8:44 AM

## 2020-12-27 NOTE — Anesthesia Postprocedure Evaluation (Signed)
Anesthesia Post Note  Patient: Wanda [name]  Procedure(s) Performed: ESOPHAGOGASTRODUODENOSCOPY (EGD) WITH PROPOFOL (N/A ) GIVENS CAPSULE STUDY (N/A )  Patient location during evaluation: PACU Anesthesia Type: General Level of consciousness: awake Pain management: pain level controlled Vital Signs Assessment: post-procedure vital signs reviewed and stable Respiratory status: spontaneous breathing Cardiovascular status: blood pressure returned to baseline Postop Assessment: no headache Anesthetic complications: no   No complications documented.   Last Vitals:  Vitals:   12/27/20 0300 12/27/20 0625  BP: (!) 167/66 (!) 114/59  Pulse: 74 74  Resp: 17 18  Temp: 37.1 C 37.2 C  SpO2: 97% 96%    Last Pain:  Vitals:   12/27/20 0625  TempSrc: Oral  PainSc:                  Wanda [name]

## 2020-12-27 NOTE — Care Management Obs Status (Signed)
[place] NOTIFICATION   Patient Details  Name: Wanda [name] MRN: [id number] Date of Birth: [birth year]   Medicare Observation Status Notification Given:  Yes (mailed to address on file)    Tommy Medal 12/27/2020, 4:03 PM

## 2020-12-27 NOTE — Discharge Summary (Addendum)
Wanda [name], is a 87 y.o. female  DOB 1933/10/27  MRN [id number].  Admission date:  12/26/2020  Admitting Physician  Roxan Hockey, MD  Discharge Date:  12/27/2020   Primary MD  Renee Rival, NP  Recommendations for primary care physician for things to follow:    1)Avoid ibuprofen/Advil/Aleve/Motrin/Goody Powders/Naproxen/BC powders/Meloxicam/Diclofenac/Indomethacin and other Nonsteroidal anti-inflammatory medications as these will make you more likely to bleed and can cause stomach ulcers, can also cause Kidney problems.   2)Please follow up with Dr Gala Romney --gastroenterologist as advised by the GI team  3) please note that there has been some changes to your medications specifically please stop taking lisinopril HCTZ for now as the blood pressure is running somewhat low, please start Protonix 40 mg twice a day and please restart Carafate as prescribed  Admission Diagnosis  Acute GI bleeding [K92.2] Upper GI bleed [K92.2]   Discharge Diagnosis  Acute GI bleeding [K92.2] Upper GI bleed [K92.2]    Active Problems:   Acute GI bleeding      Past Medical History:  Diagnosis Date  . Anemia   . Cystocele   . Diverticulosis 10/20/10   Left-sided on colonoscopy by Dr. Gala Romney 10/20/10  . Gastric polyps 10/02/2013  . Gastritis 12/08/10   EGD by Dr. Altha Harm hiatal hernia, duodenal diverticulum, chronic gastritis  . GERD (gastroesophageal reflux disease)   . Heart murmur   . Hiatal hernia 10/02/2013  . HTN (hypertension)   . IDA (iron deficiency anemia)   . 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 Osteoarthritis   . Radial fracture    (right) Undergoing treatment by Dr. Aline Brochure currently  . Sleep apnea    STOP BANG SCORE 4  . Urge incontinence   . Vitamin D deficiency     Past Surgical History:  Procedure Laterality Date  . ABDOMINAL HYSTERECTOMY    . ANTERIOR AND POSTERIOR  REPAIR  05/17/2012   Procedure: ANTERIOR (CYSTOCELE) AND POSTERIOR REPAIR (RECTOCELE);  Surgeon: Jonnie Kind, MD;  Location: [facility];  Service: Gynecology;  Laterality: N/A;  . BIOPSY  11/22/2018   Procedure: BIOPSY;  Surgeon: Daneil Dolin, MD;  Location: [facility];  Service: Endoscopy;;  . BIOPSY  02/12/2020   Procedure: BIOPSY;  Surgeon: Daneil Dolin, MD;  Location: [facility];  Service: Endoscopy;;  gastric biopsy  . COLONOSCOPY  02/29/2012   Dr. Gala Romney: colonic diverticulosis, minimal internal hemorrhoids, benign polyp  . ESOPHAGOGASTRODUODENOSCOPY  02/29/2012   Dr. Gala Romney: atonic esophagus, moderate-sized hiatal hernia, fundal gland polyps  . ESOPHAGOGASTRODUODENOSCOPY N/A 10/02/2013   Dr. Gala Romney- multiple Wilhemena Durie gastric polyps- not manipulated  . ESOPHAGOGASTRODUODENOSCOPY N/A 01/14/2015   Dr. Gala Romney: Large hiatal hernia, otherwise normal EGD  . ESOPHAGOGASTRODUODENOSCOPY (EGD) WITH PROPOFOL N/A 11/22/2018   Dr. Gala Romney: normal esophagus, large hiatal hernia, erosive gastropathy s/p biopsy, normal duodenum. Reactive gastropathy  . ESOPHAGOGASTRODUODENOSCOPY (EGD) WITH PROPOFOL N/A 02/12/2020   normal esophagus, large hiatal hernia, [name] lesions, benign gastric biopsy  . GIVENS CAPSULE STUDY  02/29/2012  No explanation for IDA. Possible extrinsic compression but negative CT.   Marland Kitchen SALPINGOOPHORECTOMY  05/17/2012   Procedure: SALPINGO OOPHERECTOMY;  Surgeon: Jonnie Kind, MD;  Location: [facility];  Service: Gynecology;  Laterality: Bilateral;  . VAGINAL HYSTERECTOMY  05/17/2012   Procedure: HYSTERECTOMY VAGINAL;  Surgeon: Jonnie Kind, MD;  Location: [facility];  Service: Gynecology;  Laterality: N/A;     HPI  from the history and physical done on the day of admission:      Wanda [name]  is a 87 y.o. female with medical history significant ofgastritis, known [name] erosions of the esophagus 2/2 GERD/large hiatal hernia,, iron deficiency and B12 deficiency anemia with  recurrent PRBC and iron infusion presents to the ED with concerns for dark stools/Elinor and episodes of hematemesis on and off over the last couple of weeks - Endorses nausea and epigastric discomfort, no further emesis today -In the ED hemoglobin is above 8, which is close to her very recent baseline but please note patient's hemoglobin back on 07/29/2020 was 12.1 -Patient takes iron pills, denies aspirin or NSAIDs, she has been compliant with Protonix -Prior colonoscopy with colonic diverticulosis and minimal internal hemorrhoids and benign polyps, removed - -GI service saw patient and recommended EGD be done on 12/27/2019 with possible capsule study     Hospital Course:    1) acute GI bleed---  Last admitted for GI bleeding back in March 2021. Completed EGD March 2021 with findings of normal esophagus, large hiatal hernia, [name] lesions, benign gastric biopsy. Last colonoscopy in April 2013: Colonic diverticulosis, minimal internal hemorrhoids, benign polyp removed. Small bowel capsule endoscopy April 2013 with no explanation for IDA. 12/26/20 -EGD with deployment of Capsule-finding of Large hiatal hernia.                           - Erosive gastropathy ([name] lesions). Possible                            attributing factor to of GI bleeding. Duodenal                            diverticulum..                           - Video capsule placed into the duodenum. - - please note patient's hemoglobin back on 07/29/2020 was 12.1 -Hemoglobin dropped down to 7.1,  -patient received 1 unit of PRBC on 12/27/2020 prior to discharge --GI consult appreciated, EGD with possible capsule deployment planned for 12/26/2020 -May need outpatient colonoscopy -Patient may need surgical fixation of a large hiatal hernia With IV Protonix, GI recommends Protonix 40 twice daily and Carafate with outpatient follow-up for capsule endoscopy report   2)HTN--hold lisinopril HCTZ at this time as BP is  somewhat soft  3) acute on chronic B12 and iron deficiency anemia--- resume monthly B12 injections, resume iron tablets --Anemia worsened due to acute blood loss, management as above #1   4)Dysphagia--- patient specifically notes dysphagia with solid food, large pills and occasionally liquids  -Had EGD with capsule deployment on 12/26/2020 --suspect due to large hiatal hernia,  -Outpatient follow-up with GI advised  5)Hypokalemia-due to HCTZ use, potassium replaced, HCTZ has been discontinued   Disposition--- Home Dispo: The patient is from: Home  Anticipated d/c is to: Home  Discharge Condition: Stable,  Follow UP--- GI as advised  Consults obtained - Gi  Diet and Activity recommendation:  As advised  Discharge Instructions    Discharge Instructions    Call MD for:  difficulty breathing, headache or visual disturbances   Complete by: As directed    Call MD for:  persistant dizziness or light-headedness   Complete by: As directed    Call MD for:  persistant nausea and vomiting   Complete by: As directed    Call MD for:  temperature >100.4   Complete by: As directed    Diet - low sodium heart healthy   Complete by: As directed    Discharge instructions   Complete by: As directed    1)Avoid ibuprofen/Advil/Aleve/Motrin/Goody Powders/Naproxen/BC powders/Meloxicam/Diclofenac/Indomethacin and other Nonsteroidal anti-inflammatory medications as these will make you more likely to bleed and can cause stomach ulcers, can also cause Kidney problems.   2)Please follow up with Dr Gala Romney --gastroenterologist as advised by the GI team  3) please note that there has been some changes to your medications specifically please stop taking lisinopril HCTZ for now as the blood pressure is running somewhat low, please start Protonix 40 mg twice a day and please restart Carafate as prescribed  4) please go to your doctor and get B12 injections every month , also  continue iron pills   Increase activity slowly   Complete by: As directed        Discharge Medications     Allergies as of 12/27/2020      Reactions   Sulfa Antibiotics    unknown   Naproxen Sodium Rash, Other (See Comments)   Passed out      Medication List    STOP taking these medications   lisinopril-hydrochlorothiazide 20-12.5 MG tablet Commonly known as: ZESTORETIC     TAKE these medications   acetaminophen 325 MG tablet Commonly known as: TYLENOL Take 2 tablets (650 mg total) by mouth every 6 (six) hours as needed for mild pain (or Fever >/= 101).   CALCIUM PO Take 300-600 mg by mouth.   ferrous sulfate 325 (65 FE) MG tablet Commonly known as: FeroSul Take 1 tablet (325 mg total) by mouth 2 (two) times daily with a meal. Take after breakfast and dinner   multivitamin tablet Take 1 tablet by mouth.   ondansetron 4 MG tablet Commonly known as: ZOFRAN Take 1 tablet (4 mg total) by mouth every 8 (eight) hours as needed for nausea or vomiting.   pantoprazole 40 MG tablet Commonly known as: Protonix Take 1 tablet (40 mg total) by mouth 2 (two) times daily. What changed: when to take this   sucralfate 1 g tablet Commonly known as: Carafate Take 1 tablet (1 g total) by mouth 4 (four) times daily -  with meals and at bedtime.      Major procedures and Radiology Reports - PLEASE review detailed and final reports for all details, in brief -   No results found.  Micro Results   Recent Results (from the past 240 hour(s))  Resp Panel by RT-PCR (Flu A&B, Covid) Nasopharyngeal Swab     Status: None   Collection Time: 12/26/20 10:00 AM   Specimen: Nasopharyngeal Swab; Nasopharyngeal(NP) swabs in vial transport medium  Result Value Ref Range Status   SARS Coronavirus 2 by RT PCR NEGATIVE NEGATIVE Final    Comment: (NOTE) SARS-CoV-2 target nucleic acids are NOT DETECTED.  The SARS-CoV-2 RNA is generally detectable in upper respiratory specimens during the  acute  phase of infection. The lowest concentration of SARS-CoV-2 viral copies this assay can detect is 138 copies/mL. A negative result does not preclude SARS-Cov-2 infection and should not be used as the sole basis for treatment or other patient management decisions. A negative result may occur with  improper specimen collection/handling, submission of specimen other than nasopharyngeal swab, presence of viral mutation(s) within the areas targeted by this assay, and inadequate number of viral copies(<138 copies/mL). A negative result must be combined with clinical observations, patient history, and epidemiological information. The expected result is Negative.  Fact Sheet for Patients:  EntrepreneurPulse.com.au  Fact Sheet for Healthcare Providers:  IncredibleEmployment.be  This test is no t yet approved or cleared by the [place] FDA and  has been authorized for detection and/or diagnosis of SARS-CoV-2 by FDA under an Emergency Use Authorization (EUA). This EUA will remain  in effect (meaning this test can be used) for the duration of the COVID-19 declaration under Section 564(b)(1) of the Act, 21 U.S.C.section 360bbb-3(b)(1), unless the authorization is terminated  or revoked sooner.       Influenza A by PCR NEGATIVE NEGATIVE Final   Influenza B by PCR NEGATIVE NEGATIVE Final    Comment: (NOTE) The Xpert Xpress SARS-CoV-2/FLU/RSV plus assay is intended as an aid in the diagnosis of influenza from Nasopharyngeal swab specimens and should not be used as a sole basis for treatment. Nasal washings and aspirates are unacceptable for Xpert Xpress SARS-CoV-2/FLU/RSV testing.  Fact Sheet for Patients: EntrepreneurPulse.com.au  Fact Sheet for Healthcare Providers: IncredibleEmployment.be  This test is not yet approved or cleared by the [place] FDA and has been authorized for detection and/or diagnosis of  SARS-CoV-2 by FDA under an Emergency Use Authorization (EUA). This EUA will remain in effect (meaning this test can be used) for the duration of the COVID-19 declaration under Section 564(b)(1) of the Act, 21 U.S.C. section 360bbb-3(b)(1), unless the authorization is terminated or revoked.  Performed at [facility], [address] [place], [place] [address]    Today   Subjective    Wanda [name] today has no new complaints --Eating and drinking okay No nausea or vomiting          Patient has been seen and examined prior to discharge   Objective   Blood pressure (!) 156/78, pulse 87, temperature 98 F (36.7 C), temperature source Oral, resp. rate 18, height 4\' 8"  (1.422 m), weight 51.7 kg, SpO2 99 %.   Intake/Output Summary (Last 24 hours) at 12/27/2020 1522 Last data filed at 12/27/2020 1500 Gross per 24 hour  Intake 1424.95 ml  Output -  Net 1424.95 ml    Exam Gen:- Awake Alert, no acute distress  HEENT:- [place].AT, No sclera icterus Neck-Supple Neck,No JVD,.  Lungs-  CTAB , good air movement bilaterally  CV- S1, S2 normal, regular Abd-  +ve B.Sounds, Abd Soft, No tenderness,   Extremity/Skin:- No  edema,   good pulses Psych-affect is appropriate, oriented x3 Neuro-no new focal deficits, no tremors    Data Review   CBC w Diff:  Lab Results  Component Value Date   WBC 6.0 12/27/2020   HGB 7.1 (L) 12/27/2020   HGB 12.4 02/21/2014   HCT 22.9 (L) 12/27/2020   HCT 37.2 02/21/2014   PLT 262 12/27/2020   PLT 273 02/21/2014   LYMPHOPCT 9 12/26/2020   LYMPHOPCT 30.3 02/21/2014   MONOPCT 4 12/26/2020   MONOPCT 7.8 02/21/2014   EOSPCT 0 12/26/2020   EOSPCT 3.0  02/21/2014   BASOPCT 1 12/26/2020   BASOPCT 1.0 02/21/2014    CMP:  Lab Results  Component Value Date   NA 141 12/27/2020   K 3.3 (L) 12/27/2020   CL 110 12/27/2020   CO2 23 12/27/2020   BUN 29 (H) 12/27/2020   CREATININE 0.62 12/27/2020   CREATININE 0.63 03/24/2012   PROT 6.7 12/26/2020    ALBUMIN 3.7 12/26/2020   BILITOT 1.1 12/26/2020   ALKPHOS 76 12/26/2020   AST 27 12/26/2020   ALT 23 12/26/2020    Total Discharge time is about 33 minutes  Roxan Hockey M.D on 12/27/2020 at 3:22 PM  Go to [email or web address] -  for contact info  [facility] - Office  [phone]

## 2020-12-27 NOTE — Op Note (Addendum)
Small Bowel G[place]ens Capsule Study Procedure date:  12/26/20  Referring Provider:  Dr. Norvel Richards PCP:  Dr. Ahmed Prima, Laurita Quint, NP  Indication for procedure:  Melena   Patient data:  Wt: 51.7 kg Ht: 4\' 9"  Waist: N/A  Findings:  Complete study, rapid transit study. Initially no obvious melena. Some increase in flecks of dark blood in duodenum (see image 00:10:06) which became more frequent with darkening effluent (00:19:40 & 00:27:45). Area that appears to be possible lymphangiectasias on either an unusual appearing intestinal fold versus nodule at 00:29:49. Intestinal contents seemed to clear up a bit shortly after that before becoming dark again near the end of the small bowel (01:30:51). When the capsule appears to drop into the cecum the remainder of the study has appearance of melanotic stool. NO active bleeding noted.  First Gastric image:  N/A (deployed directly into duodenum) First Duodenal image: 00:05:23 First Ileo-Cecal Valve image: N/A First Cecal image: 02:55:21 Gastric Passage time: N/A Small Bowel Passage time:  02h 32m  Summary & Recommendations: No active bleeding. Dark effluent noted intermittently in the small bowel. Areas of lymphangiectasia collection in 2 spots, one of which is on an unusual appearing fold versus nodule. Melanotic stool noted in the cecum. No active bleeding or fresh blood noted.  Recommend reviewing images for possible endoscopic evaluation of areas of concern. Refer to hematology for ongoing anemia management.  Thank you for allowing [place] to participate in the care of Burt, DNP, AGNP-C Adult & Gerontological Nurse Practitioner [facility]  Attending note: Images reviewed. No clinically significant findings to explain GI bleeding. We will see her back in the office in the coming weeks.

## 2020-12-28 LAB — TYPE AND SCREEN
ABO/RH(D): O POS
Antibody Screen: NEGATIVE
Unit division: 0

## 2020-12-28 LAB — BPAM RBC
Blood Product Expiration Date: 202203162359
ISSUE DATE / TIME: 202202111129
Unit Type and Rh: 5100

## 2021-01-01 ENCOUNTER — Encounter (HOSPITAL_COMMUNITY): Payer: Self-pay | Admitting: Internal Medicine

## 2021-01-01 ENCOUNTER — Other Ambulatory Visit: Payer: Self-pay

## 2021-01-01 ENCOUNTER — Telehealth: Payer: Self-pay | Admitting: Nurse Practitioner

## 2021-01-01 DIAGNOSIS — K922 Gastrointestinal hemorrhage, unspecified: Secondary | ICD-10-CM

## 2021-01-01 DIAGNOSIS — Z79899 Other long term (current) drug therapy: Secondary | ICD-10-CM

## 2021-01-01 NOTE — Telephone Encounter (Signed)
Please tell the patient we finalized reading her capsule endoscopy report.  There is no significant findings that would explain her bleeding, although we did see signs of old blood in the beginning of her large intestines.  Nothing was actively bleeding on the exam to reviewed  Keep her appointment with the hematologist to help manage her anemia.  We will see her back in the office in about a month to follow-up.  I would like her to have a CBC rechecked in about 2 weeks (end of February; I have entered the order already as a future order).  She should let us know if she sees any obvious bleeding especially if she is having worsening weakness, fatigue, dizziness, passing out associated with the bleeding  Call if she has any questions or concerns

## 2021-01-01 NOTE — Telephone Encounter (Signed)
Spoke with pt. Pt was notified of results and recommendations. Pt will complete labs as directed at a lab closet to her so she doesn't have to come to [place]. New  lab orders placed for [facility] and mailed per pts request.

## 2021-01-07 ENCOUNTER — Other Ambulatory Visit: Payer: Self-pay | Admitting: Nurse Practitioner

## 2021-01-07 LAB — CBC WITH DIFFERENTIAL/PLATELET
Absolute Monocytes: 568 cells/uL (ref 200–950)
Basophils Absolute: 46 cells/uL (ref 0–200)
Basophils Relative: 0.4 %
Eosinophils Absolute: 244 cells/uL (ref 15–500)
Eosinophils Relative: 2.1 %
HCT: 30.6 % — ABNORMAL LOW (ref 35.0–45.0)
Hemoglobin: 10 g/dL — ABNORMAL LOW (ref 11.7–15.5)
Lymphs Abs: 1972 cells/uL (ref 850–3900)
MCH: 31.3 pg (ref 27.0–33.0)
MCHC: 32.7 g/dL (ref 32.0–36.0)
MCV: 95.6 fL (ref 80.0–100.0)
MPV: 10.9 fL (ref 7.5–12.5)
Monocytes Relative: 4.9 %
Neutro Abs: 8770 cells/uL — ABNORMAL HIGH (ref 1500–7800)
Neutrophils Relative %: 75.6 %
Platelets: 325 10*3/uL (ref 140–400)
RBC: 3.2 10*6/uL — ABNORMAL LOW (ref 3.80–5.10)
RDW: 12.1 % (ref 11.0–15.0)
Total Lymphocyte: 17 %
WBC: 11.6 10*3/uL — ABNORMAL HIGH (ref 3.8–10.8)

## 2021-01-15 NOTE — Progress Notes (Signed)
Cc'ed to pcp °

## 2021-01-27 ENCOUNTER — Telehealth (HOSPITAL_COMMUNITY): Payer: Self-pay

## 2021-01-27 ENCOUNTER — Other Ambulatory Visit (HOSPITAL_COMMUNITY): Payer: Self-pay

## 2021-01-27 DIAGNOSIS — D509 Iron deficiency anemia, unspecified: Secondary | ICD-10-CM

## 2021-01-27 DIAGNOSIS — E538 Deficiency of other specified B group vitamins: Secondary | ICD-10-CM

## 2021-01-27 NOTE — Telephone Encounter (Signed)
This nurse spoke with Abbe Amsterdam, who stated that he would like to see if patient could have her labs drawn at the [facility] in [place].  This nurse faxed over the orders and advised him that patient can walk in and have labs drawn at anytime.  No further questions or concerns at this time

## 2021-01-29 ENCOUNTER — Inpatient Hospital Stay (HOSPITAL_COMMUNITY): Payer: Medicare Other

## 2021-02-04 ENCOUNTER — Ambulatory Visit (INDEPENDENT_AMBULATORY_CARE_PROVIDER_SITE_OTHER): Payer: Medicare Other | Admitting: Internal Medicine

## 2021-02-04 ENCOUNTER — Other Ambulatory Visit: Payer: Self-pay

## 2021-02-04 ENCOUNTER — Encounter: Payer: Self-pay | Admitting: Internal Medicine

## 2021-02-04 VITALS — BP 179/83 | HR 71 | Temp 97.5°F | Ht <= 58 in | Wt 113.0 lb

## 2021-02-04 DIAGNOSIS — D509 Iron deficiency anemia, unspecified: Secondary | ICD-10-CM

## 2021-02-04 DIAGNOSIS — K922 Gastrointestinal hemorrhage, unspecified: Secondary | ICD-10-CM | POA: Diagnosis not present

## 2021-02-04 NOTE — Patient Instructions (Signed)
Continue Protonix 40 mg twice daily (take before breakfast and supper)  Decrease Carafate slurry's to 1 g twice daily (midmorning and midafternoon)  Information on a GERD treatment diet  Office visit with [place] in 3 months  As discussed, I do not feel there is a need for a colonoscopy at this time.  However, clinical course may quire [place] to change plans.  Blood pressure 179/83 today which is elevated.  I would simply have that rechecked tomorrow by the blood specialist;  if still elevated get back in touch with Angelina Ok.

## 2021-02-04 NOTE — Progress Notes (Signed)
Primary Care Physician:  Renee Rival, NP Primary Gastroenterologist:  Dr. Gala Romney  Pre-Procedure History & Physical: HPI:  Wanda [name] is a 87 y.o. female here for hospital follow-up of upper GI bleed.  Presented with hematemesis and melena.  EGD demonstrated a large hiatal hernia and [name] lesions.  Capsule study did not demonstrate a bleeding lesion in her small intestine.  She states she is done fairly well her follow-up hemoglobin was 10 a couple weeks ago reportedly 11 yesterday by [facility] not available for review.  Continues on Protonix twice daily and Carafate 1 g slurry's 4 times daily.  She does not care too much for the Carafate.  Has not had any melena or rectal bleeding.  Appetite is good no dysphagia.  No abdominal pain.  No rectal bleeding or melena.  Pressure is elevated today.  Her blood pressure medication was stopped during her recent hospitalization She is seeing the hematologist tomorrow. Past Medical History:  Diagnosis Date  . Anemia   . Cystocele   . Diverticulosis 10/20/10   Left-sided on colonoscopy by Dr. Gala Romney 10/20/10  . Gastric polyps 10/02/2013  . Gastritis 12/08/10   EGD by Dr. Altha Harm hiatal hernia, duodenal diverticulum, chronic gastritis  . GERD (gastroesophageal reflux disease)   . Heart murmur   . Hiatal hernia 10/02/2013  . HTN (hypertension)   . IDA (iron deficiency anemia)   . 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 Osteoarthritis   . Radial fracture    (right) Undergoing treatment by Dr. Aline Brochure currently  . Sleep apnea    STOP BANG SCORE 4  . Urge incontinence   . Vitamin D deficiency     Past Surgical History:  Procedure Laterality Date  . ABDOMINAL HYSTERECTOMY    . ANTERIOR AND POSTERIOR REPAIR  05/17/2012   Procedure: ANTERIOR (CYSTOCELE) AND POSTERIOR REPAIR (RECTOCELE);  Surgeon: Jonnie Kind, MD;  Location: [facility];  Service: Gynecology;  Laterality: N/A;  . BIOPSY  11/22/2018   Procedure: BIOPSY;  Surgeon:  Daneil Dolin, MD;  Location: [facility];  Service: Endoscopy;;  . BIOPSY  02/12/2020   Procedure: BIOPSY;  Surgeon: Daneil Dolin, MD;  Location: [facility];  Service: Endoscopy;;  gastric biopsy  . COLONOSCOPY  02/29/2012   Dr. Gala Romney: colonic diverticulosis, minimal internal hemorrhoids, benign polyp  . ESOPHAGOGASTRODUODENOSCOPY  02/29/2012   Dr. Gala Romney: atonic esophagus, moderate-sized hiatal hernia, fundal gland polyps  . ESOPHAGOGASTRODUODENOSCOPY N/A 10/02/2013   Dr. Gala Romney- multiple Wilhemena Durie gastric polyps- not manipulated  . ESOPHAGOGASTRODUODENOSCOPY N/A 01/14/2015   Dr. Gala Romney: Large hiatal hernia, otherwise normal EGD  . ESOPHAGOGASTRODUODENOSCOPY (EGD) WITH PROPOFOL N/A 11/22/2018   Dr. Gala Romney: normal esophagus, large hiatal hernia, erosive gastropathy s/p biopsy, normal duodenum. Reactive gastropathy  . ESOPHAGOGASTRODUODENOSCOPY (EGD) WITH PROPOFOL N/A 02/12/2020   normal esophagus, large hiatal hernia, [name] lesions, benign gastric biopsy  . ESOPHAGOGASTRODUODENOSCOPY (EGD) WITH PROPOFOL N/A 12/26/2020   Procedure: ESOPHAGOGASTRODUODENOSCOPY (EGD) WITH PROPOFOL;  Surgeon: Daneil Dolin, MD;  Location: [facility];  Service: Endoscopy;  Laterality: N/A;  . GIVENS CAPSULE STUDY  02/29/2012   No explanation for IDA. Possible extrinsic compression but negative CT.   Marland Kitchen GIVENS CAPSULE STUDY N/A 12/26/2020   Procedure: GIVENS CAPSULE STUDY;  Surgeon: Daneil Dolin, MD;  Location: [facility];  Service: Endoscopy;  Laterality: N/A;  . SALPINGOOPHORECTOMY  05/17/2012   Procedure: SALPINGO OOPHERECTOMY;  Surgeon: Jonnie Kind, MD;  Location: [facility];  Service: Gynecology;  Laterality: Bilateral;  . VAGINAL HYSTERECTOMY  05/17/2012   Procedure: HYSTERECTOMY VAGINAL;  Surgeon: Jonnie Kind, MD;  Location: [facility];  Service: Gynecology;  Laterality: N/A;    Prior to Admission medications   Medication Sig Start Date End Date Taking? Authorizing Provider   acetaminophen (TYLENOL) 325 MG tablet Take 2 tablets (650 mg total) by mouth every 6 (six) hours as needed for mild pain (or Fever >/= 101). 12/27/20  Yes Emokpae, Courage, MD  CALCIUM PO Take 300-600 mg by mouth.    Yes [provider]  Cholecalciferol (VITAMIN D3) 25 MCG (1000 UT) CAPS Take by mouth daily.   Yes [provider]  ferrous sulfate (FEROSUL) 325 (65 FE) MG tablet Take 1 tablet (325 mg total) by mouth 2 (two) times daily with a meal. Take after breakfast and dinner 12/27/20  Yes Emokpae, Courage, MD  Multiple Vitamin (MULTIVITAMIN) tablet Take 1 tablet by mouth.    Yes [provider]  ondansetron (ZOFRAN) 4 MG tablet Take 1 tablet (4 mg total) by mouth every 8 (eight) hours as needed for nausea or vomiting. 12/27/20  Yes Emokpae, Courage, MD  pantoprazole (PROTONIX) 40 MG tablet Take 1 tablet (40 mg total) by mouth 2 (two) times daily. 12/27/20 12/27/21 Yes Emokpae, Courage, MD  sucralfate (CARAFATE) 1 g tablet Take 1 tablet (1 g total) by mouth 4 (four) times daily -  with meals and at bedtime. 12/27/20  Yes Roxan Hockey, MD    Allergies as of 02/04/2021 - Review Complete 02/04/2021  Allergen Reaction Noted  . Sulfa antibiotics  06/23/2016  . Naproxen sodium Rash and Other (See Comments)     Family History  Problem Relation Age of Onset  . Hypertension Mother   . Lymphoma Brother   . Anesthesia problems Neg Hx   . Hypotension Neg Hx   . Malignant hyperthermia Neg Hx   . Pseudochol deficiency Neg Hx   . Colon cancer Neg Hx     Social History   Socioeconomic History  . Marital status: Widowed    Spouse name: Not on file  . Number of children: Not on file  . Years of education: Not on file  . Highest education level: Not on file  Occupational History  . Occupation: Retired    Fish farm manager: RETIRED  Tobacco Use  . Smoking status: Never Smoker  . Smokeless tobacco: Never Used  . Tobacco comment: Never smoked  Substance and Sexual Activity   . Alcohol use: No    Alcohol/week: 0.0 standard drinks  . Drug use: No  . Sexual activity: Not Currently    Birth control/protection: Surgical  Other Topics Concern  . Not on file  Social History Narrative  . Not on file   Social Determinants of Health   Financial Resource Strain: Not on file  Food Insecurity: Not on file  Transportation Needs: Not on file  Physical Activity: Not on file  Stress: Not on file  Social Connections: Not on file  Intimate Partner Violence: Not on file    Review of Systems: See HPI, otherwise negative ROS  Physical Exam: BP (!) 179/83   Pulse 71   Temp (!) 97.5 F (36.4 C) (Temporal)   Ht 4\' 9"  (1.448 m)   Wt 113 lb (51.3 kg)   BMI 24.45 kg/m Accompanied by her son today. General:   Alert,   pleasant and cooperative in NAD; accompanied by her son Neck:  Supple; no masses or thyromegaly. No significant cervical adenopathy.  Lungs:  Clear throughout to auscultation.   No wheezes, crackles, or rhonchi. No acute distress. Heart:  Regular rate and rhythm; no murmurs, clicks, rubs,  or gallops. Abdomen: Non-distended, normal bowel sounds.  Soft and nontender without appreciable mass or hepatosplenomegaly.  Pulses:  Normal pulses noted. Extremities:  Without clubbing or edema.  Impression/Plan: Pleasant 87 year old lady with recent upper GI bleed.  She has a large hiatal hernia Lysbeth Galas lesions felt to be source of bleeding given evaluation thus far.  No doubt she had hematemesis. Discussed that Lysbeth Galas lesions are essentially related to trauma to the gastric mucosa sliding back and forth over the diaphragmatic hiatus. She is not a surgical candidate for hernia repair. Its been quite a while since she has had her colon evaluated.  However, presentation not consistent with colonic source of blood loss. Blood pressure up-to-date; her hypertension regimen was stopped during recent hospitalization.  Recommendations:  Continue Protonix 40 mg twice  daily (take before breakfast and supper)  Decrease Carafate slurry's to 1 g twice daily (midmorning and midafternoon)  Information on a GERD treatment diet  Office visit with [place] in 3 months  As discussed, I do not feel there is a need for a colonoscopy at this time.  However, depending on clinical course, recommendations could change.  Blood pressure 179/83 today which is elevated.  I would simply have that rechecked tomorrow by the blood specialist;  if still elevated get back in touch with Angelina Ok.        Notice: This dictation was prepared with Dragon dictation along with smaller phrase technology. Any transcriptional errors that result from this process are unintentional and may not be corrected upon review.

## 2021-02-05 ENCOUNTER — Inpatient Hospital Stay (HOSPITAL_COMMUNITY): Payer: Medicare Other | Attending: Hematology | Admitting: Physician Assistant

## 2021-02-05 VITALS — BP 170/65 | HR 79 | Temp 97.3°F | Resp 18 | Wt 113.3 lb

## 2021-02-05 DIAGNOSIS — I10 Essential (primary) hypertension: Secondary | ICD-10-CM | POA: Insufficient documentation

## 2021-02-05 DIAGNOSIS — D500 Iron deficiency anemia secondary to blood loss (chronic): Secondary | ICD-10-CM | POA: Insufficient documentation

## 2021-02-05 DIAGNOSIS — E538 Deficiency of other specified B group vitamins: Secondary | ICD-10-CM | POA: Insufficient documentation

## 2021-02-05 DIAGNOSIS — K922 Gastrointestinal hemorrhage, unspecified: Secondary | ICD-10-CM | POA: Diagnosis present

## 2021-02-05 NOTE — Patient Instructions (Signed)
[place] at [facility] Discharge Instructions  You were seen today by Tarri Abernethy PA-C for your iron deficiency anemia.    LABS: Return in 3 months for labs.   OTHER TESTS: None  MEDICATIONS: IV Iron infusions starting next week  FOLLOW-UP APPOINTMENT: Phone visit with Hernando Reali in 3 months.   Thank you for choosing [place] at [facility] to provide your oncology and hematology care.  To afford each patient quality time with our provider, please arrive at least 15 minutes before your scheduled appointment time.   If you have a lab appointment with the [facility] please come in thru the Main Entrance and check in at the main information desk.  You need to re-schedule your appointment should you arrive 10 or more minutes late.  We strive to give you quality time with our providers, and arriving late affects you and other patients whose appointments are after yours.  Also, if you no show three or more times for appointments you may be dismissed from the clinic at the providers discretion.     Again, thank you for choosing [facility].  Our hope is that these requests will decrease the amount of time that you wait before being seen by our physicians.       _____________________________________________________________  Should you have questions after your visit to [facility], please contact our office at [phone] and follow the prompts.  Our office hours are 8:00 a.m. and 4:30 p.m. Monday - Friday.  Please note that voicemails left after 4:00 p.m. may not be returned until the following business day.  We are closed weekends and major holidays.  You do have access to a nurse 24-7, just call the main number to the clinic [phone] and do not press any options, hold on the line and a nurse will answer the phone.    For prescription refill requests, have your pharmacy contact our office and allow 72 hours.     Due to Covid, you will need to wear a mask upon entering the hospital. If you do not have a mask, a mask will be given to you at the Main Entrance upon arrival. For doctor visits, patients may have 1 support person age 60 or older with them. For treatment visits, patients can not have anyone with them due to social distancing guidelines and our immunocompromised population.

## 2021-02-05 NOTE — Progress Notes (Signed)
[place], [place] [address]   CLINIC:  Medical Oncology/Hematology  PCP:  Renee Rival, NP [address] [place]  [address] [phone]   REASON FOR VISIT:  Follow-up for deficiency anemia (secondary to GI bleeding) and vitamin B12 deficiency  CURRENT THERAPY: Intermittent IV iron, B12 injections  INTERVAL HISTORY:  [name] 87 y.o. female returns for routine follow-up of iron deficiency anemia.  She was last seen 1 year ago on 01/09/2020.  She has had episodes of intermittent GI bleeding leading to chronic iron deficiency and anemia.  Last IV iron infusion was given in March 2021.  She gets monthly B12 injections from her PCP.  Patient was hospitalized from 12/26/2020 through 12/27/2020 due to acute GI bleeding.  She presented to the emergency department with complaints of melena and hematemesis that had occurred twice in January and February.  She had EGD done on 12/26/2020 which found large hiatal hernia, erosive gastropathy with Lysbeth Galas lesions, that were thought to be the cause of GI bleeding.  Hemoglobin dropped to 7.1 while hospitalized, she was given 1 unit PRBC on 12/27/2020.   She was instructed at discharge to avoid NSAIDs, was started on twice daily Protonix and Carafate, and was asked to follow-up with Dr. Gala Romney, gastroenterology.  Capsule endoscopy was completed with GI, with no source of bleeding found in the small intestine.  Labs obtained at [facility] laboratory 01/20/2021 showed hemoglobin 10.8, ferritin 75, serum iron 34, iron saturation 11%.  Vitamin B12 was 464.  Over the past month, she continues to remain fatigued at her baseline.  Her fatigue has not significantly improved after blood transfusions or iron infusions.   She has not had any more bleeding episodes after discharge from the hospital.  No epistaxis, hematuria, hematochezia, hematemesis, melena. She denies recent chest pain on exertion, shortness of breath on minimal  exertion, pre-syncopal episodes, or palpitations. The patient denies over the counter NSAID ingestion. She is not on antiplatelets agents or blood thinners. She denies any pica and eats a variety of diet. The patient was prescribed oral iron supplements and she takes twice daily.  She has 20% energy and 100% appetite. She endorses that she is maintaining a stable weight.   REVIEW OF SYSTEMS:  Review of Systems  Constitutional: Positive for fatigue. Negative for appetite change, chills, diaphoresis, fever and unexpected weight change.  HENT:   Negative for lump/mass and nosebleeds.   Eyes: Negative for eye problems.  Respiratory: Negative for cough, hemoptysis and shortness of breath.   Cardiovascular: Negative for chest pain, leg swelling and palpitations.  Gastrointestinal: Positive for blood in stool (January/February 2022). Negative for abdominal pain, constipation, diarrhea, nausea and vomiting.  Genitourinary: Negative for hematuria.   Skin: Negative.   Neurological: Positive for headaches (Yesterday, associated with "pinched nerve in neck," but no headache today). Negative for dizziness and light-headedness.  Hematological: Does not bruise/bleed easily.      PAST MEDICAL/SURGICAL HISTORY:  Past Medical History:  Diagnosis Date  . Anemia   . Cystocele   . Diverticulosis 10/20/10   Left-sided on colonoscopy by Dr. Gala Romney 10/20/10  . Gastric polyps 10/02/2013  . Gastritis 12/08/10   EGD by Dr. Altha Harm hiatal hernia, duodenal diverticulum, chronic gastritis  . GERD (gastroesophageal reflux disease)   . Heart murmur   . Hiatal hernia 10/02/2013  . HTN (hypertension)   . IDA (iron deficiency anemia)   . 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 Osteoarthritis   .  Radial fracture    (right) Undergoing treatment by Dr. Aline Brochure currently  . Sleep apnea    STOP BANG SCORE 4  . Urge incontinence   . Vitamin D deficiency    Past Surgical History:  Procedure Laterality Date   . ABDOMINAL HYSTERECTOMY    . ANTERIOR AND POSTERIOR REPAIR  05/17/2012   Procedure: ANTERIOR (CYSTOCELE) AND POSTERIOR REPAIR (RECTOCELE);  Surgeon: Jonnie Kind, MD;  Location: [facility];  Service: Gynecology;  Laterality: N/A;  . BIOPSY  11/22/2018   Procedure: BIOPSY;  Surgeon: Daneil Dolin, MD;  Location: [facility];  Service: Endoscopy;;  . BIOPSY  02/12/2020   Procedure: BIOPSY;  Surgeon: Daneil Dolin, MD;  Location: [facility];  Service: Endoscopy;;  gastric biopsy  . COLONOSCOPY  02/29/2012   Dr. Gala Romney: colonic diverticulosis, minimal internal hemorrhoids, benign polyp  . ESOPHAGOGASTRODUODENOSCOPY  02/29/2012   Dr. Gala Romney: atonic esophagus, moderate-sized hiatal hernia, fundal gland polyps  . ESOPHAGOGASTRODUODENOSCOPY N/A 10/02/2013   Dr. Gala Romney- multiple Wilhemena Durie gastric polyps- not manipulated  . ESOPHAGOGASTRODUODENOSCOPY N/A 01/14/2015   Dr. Gala Romney: Large hiatal hernia, otherwise normal EGD  . ESOPHAGOGASTRODUODENOSCOPY (EGD) WITH PROPOFOL N/A 11/22/2018   Dr. Gala Romney: normal esophagus, large hiatal hernia, erosive gastropathy s/p biopsy, normal duodenum. Reactive gastropathy  . ESOPHAGOGASTRODUODENOSCOPY (EGD) WITH PROPOFOL N/A 02/12/2020   normal esophagus, large hiatal hernia, [name] lesions, benign gastric biopsy  . ESOPHAGOGASTRODUODENOSCOPY (EGD) WITH PROPOFOL N/A 12/26/2020   Procedure: ESOPHAGOGASTRODUODENOSCOPY (EGD) WITH PROPOFOL;  Surgeon: Daneil Dolin, MD;  Location: [facility];  Service: Endoscopy;  Laterality: N/A;  . GIVENS CAPSULE STUDY  02/29/2012   No explanation for IDA. Possible extrinsic compression but negative CT.   Marland Kitchen GIVENS CAPSULE STUDY N/A 12/26/2020   Procedure: GIVENS CAPSULE STUDY;  Surgeon: Daneil Dolin, MD;  Location: [facility];  Service: Endoscopy;  Laterality: N/A;  . SALPINGOOPHORECTOMY  05/17/2012   Procedure: SALPINGO OOPHERECTOMY;  Surgeon: Jonnie Kind, MD;  Location: [facility];  Service: Gynecology;  Laterality:  Bilateral;  . VAGINAL HYSTERECTOMY  05/17/2012   Procedure: HYSTERECTOMY VAGINAL;  Surgeon: Jonnie Kind, MD;  Location: [facility];  Service: Gynecology;  Laterality: N/A;     SOCIAL HISTORY:  Social History   Socioeconomic History  . Marital status: Widowed    Spouse name: Not on file  . Number of children: Not on file  . Years of education: Not on file  . Highest education level: Not on file  Occupational History  . Occupation: Retired    Fish farm manager: RETIRED  Tobacco Use  . Smoking status: Never Smoker  . Smokeless tobacco: Never Used  . Tobacco comment: Never smoked  Substance and Sexual Activity  . Alcohol use: No    Alcohol/week: 0.0 standard drinks  . Drug use: No  . Sexual activity: Not Currently    Birth control/protection: Surgical  Other Topics Concern  . Not on file  Social History Narrative  . Not on file   Social Determinants of Health   Financial Resource Strain: Not on file  Food Insecurity: Not on file  Transportation Needs: Not on file  Physical Activity: Not on file  Stress: Not on file  Social Connections: Not on file  Intimate Partner Violence: Not on file    FAMILY HISTORY:  Family History  Problem Relation Age of Onset  . Hypertension Mother   . Lymphoma Brother   . Anesthesia problems Neg Hx   . Hypotension Neg Hx   .  Malignant hyperthermia Neg Hx   . Pseudochol deficiency Neg Hx   . Colon cancer Neg Hx     CURRENT MEDICATIONS:  Outpatient Encounter Medications as of 02/05/2021  Medication Sig Note  . acetaminophen (TYLENOL) 325 MG tablet Take 2 tablets (650 mg total) by mouth every 6 (six) hours as needed for mild pain (or Fever >/= 101).   . CALCIUM PO Take 300-600 mg by mouth.  02/11/2020: Sometimes doesn't take everyday  . Cholecalciferol (VITAMIN D3) 25 MCG (1000 UT) CAPS Take by mouth daily.   . ferrous sulfate (FEROSUL) 325 (65 FE) MG tablet Take 1 tablet (325 mg total) by mouth 2 (two) times daily with a meal. Take after breakfast  and dinner   . Multiple Vitamin (MULTIVITAMIN) tablet Take 1 tablet by mouth.  02/11/2020: Sometimes doesn't take everyday   . ondansetron (ZOFRAN) 4 MG tablet Take 1 tablet (4 mg total) by mouth every 8 (eight) hours as needed for nausea or vomiting.   . pantoprazole (PROTONIX) 40 MG tablet Take 1 tablet (40 mg total) by mouth 2 (two) times daily.   . sucralfate (CARAFATE) 1 g tablet Take 1 tablet (1 g total) by mouth 4 (four) times daily -  with meals and at bedtime.    No facility-administered encounter medications on file as of 02/05/2021.    ALLERGIES:  Allergies  Allergen Reactions  . Sulfa Antibiotics     unknown  . Naproxen Sodium Rash and Other (See Comments)    Passed out     PHYSICAL EXAM:  ECOG PERFORMANCE STATUS: 1 - Symptomatic but completely ambulatory  Vitals:   02/05/21 1312  BP: (!) 170/65  Pulse: 79  Resp: 18  Temp: (!) 97.3 F (36.3 C)  SpO2: 99%   Filed Weights   02/05/21 1312  Weight: 113 lb 5.1 oz (51.4 kg)   Physical Exam Constitutional:      Appearance: Normal appearance.  HENT:     Head: Normocephalic and atraumatic.     Mouth/Throat:     Mouth: Mucous membranes are moist.  Eyes:     Extraocular Movements: Extraocular movements intact.     Pupils: Pupils are equal, round, and reactive to light.  Cardiovascular:     Rate and Rhythm: Normal rate and regular rhythm.     Pulses: Normal pulses.     Heart sounds: Normal heart sounds.  Pulmonary:     Effort: Pulmonary effort is normal.     Breath sounds: Normal breath sounds.  Abdominal:     General: Bowel sounds are normal.     Palpations: Abdomen is soft.     Tenderness: There is no abdominal tenderness.  Musculoskeletal:        General: No swelling.     Right lower leg: No edema.     Left lower leg: No edema.  Lymphadenopathy:     Cervical: No cervical adenopathy.  Skin:    General: Skin is warm and dry.  Neurological:     General: No focal deficit present.     Mental Status: She  is alert and oriented to person, place, and time.  Psychiatric:        Mood and Affect: Mood normal.        Behavior: Behavior normal.      LABORATORY DATA:  I have reviewed the labs as listed.  CBC    Component Value Date/Time   WBC 11.6 (H) 01/07/2021 1428   RBC 3.20 (L) 01/07/2021 1428  HGB 10.0 (L) 01/07/2021 1428   HGB 12.4 02/21/2014 1328   HCT 30.6 (L) 01/07/2021 1428   HCT 37.2 02/21/2014 1328   PLT 325 01/07/2021 1428   PLT 273 02/21/2014 1328   MCV 95.6 01/07/2021 1428   MCV 93 02/21/2014 1328   MCH 31.3 01/07/2021 1428   MCHC 32.7 01/07/2021 1428   RDW 12.1 01/07/2021 1428   RDW 15.0 (H) 02/21/2014 1328   LYMPHSABS 1,972 01/07/2021 1428   LYMPHSABS 2.1 02/21/2014 1328   MONOABS 0.4 12/26/2020 0834   MONOABS 0.5 02/21/2014 1328   EOSABS 244 01/07/2021 1428   EOSABS 0.2 02/21/2014 1328   BASOSABS 46 01/07/2021 1428   BASOSABS 0.1 02/21/2014 1328   CMP Latest Ref Rng & Units 12/27/2020 12/26/2020 06/28/2020  Glucose 70 - 99 mg/dL 91 115(H) 111(H)  BUN 8 - 23 mg/dL 29(H) 49(H) 44(H)  Creatinine 0.44 - 1.00 mg/dL 0.62 0.69 0.83  Sodium 135 - 145 mmol/L 141 138 135  Potassium 3.5 - 5.1 mmol/L 3.3(L) 4.1 4.5  Chloride 98 - 111 mmol/L 110 107 103  CO2 22 - 32 mmol/L 23 23 22   Calcium 8.9 - 10.3 mg/dL 8.5(L) 8.8(L) 9.0  Total Protein 6.5 - 8.1 g/dL - 6.7 7.1  Total Bilirubin 0.3 - 1.2 mg/dL - 1.1 1.1  Alkaline Phos 38 - 126 U/L - 76 81  AST 15 - 41 U/L - 27 23  ALT 0 - 44 U/L - 23 19    DIAGNOSTIC IMAGING:  I have independently reviewed the relevant imaging and discussed with the patient.  ASSESSMENT: 1.  Iron deficiency anemia: -Patient has had intermittent GI bleeding over the past several years, about once per year.  Has had multiple units of PRBCs, as well as intermittent IV iron. -Patient had EGD in 2016 by Dr. Gala Romney with a large hiatal hernia, otherwise normal.  Previously noted multiple [name] lesions in 2014.  Capsule study in 2013 without  explanation for IDA.  Last colonoscopy in 2013 with colonic diverticulosis. -Patient was recently hospitalized from 12/26/2020 through 12/28/2020 due to acute anemia (hemoglobin 7.1) secondary to acute GI bleeding.  GI work-up included EGD on 12/26/2020 which found large hiatal hernia, erosive gastropathy with Lysbeth Galas lesions.  Transfused PRBC x1 during this hospital visit. -Labs from 01/30/2021 ([facility]) showed hemoglobin 10.8, ferritin 75, iron saturation 11%, serum iron 34  2.  Vitamin B12 deficiency: -Most recent B12 was 464 (01/30/2021) -She is receiving monthly B12 injections from her primary care provider   PLAN:  1.  Iron deficiency anemia secondary to chronic GI bleeding -Scheduled for IV Feraheme x2 doses -Return in 3 months for repeat labs, telephone visit to discuss results -Continue to take oral iron tablet twice daily  2.  Vitamin B12 deficiency: -Most recent B12 was 464 (01/30/2021) -She is receiving monthly B12 injections from her primary care provider  3.  Hypertension -Patient's blood pressure medications (HCTZ/lisinopril) were held at the time of hospital discharge due to marginal blood pressures in the setting of recent blood last -She measured 170/65 during her visit today, reports that her home blood pressures have been 140s/90s -Instructed patient to restart her antihypertensive medications, and to follow-up with her primary care provider next week for further management -Instructed patient to hold her antihypertensives if she notices blood pressure less than 110/80 awaiting appointment with PCP   PLAN SUMMARY & DISPOSITION: -Schedule for IV Feraheme x2 doses -Repeat labs in 3 months -Continue oral iron supplement, monthly B12 injections at  primary care office -Telephone visit in 3 months to discuss lab results  All questions were answered. The patient knows to call the clinic with any problems, questions or concerns.  Medical decision making: Moderate (1  chronic illness with exacerbation, review of external note from hospital visit and GI doctor, review of outside labs, ordering new labs for next visit)  Time spent on visit: I spent 25 minutes counseling the patient face to face. The total time spent in the appointment was 30 minutes and more than 50% was on counseling.   Harriett Rush, PA-C  02/05/21 1:20 PM

## 2021-02-06 ENCOUNTER — Ambulatory Visit: Payer: Medicare Other | Admitting: Gastroenterology

## 2021-02-07 ENCOUNTER — Inpatient Hospital Stay (HOSPITAL_COMMUNITY): Payer: Medicare Other

## 2021-02-07 ENCOUNTER — Encounter (HOSPITAL_COMMUNITY): Payer: Self-pay

## 2021-02-07 ENCOUNTER — Other Ambulatory Visit: Payer: Self-pay

## 2021-02-07 VITALS — BP 154/61 | HR 58 | Temp 97.7°F | Resp 18

## 2021-02-07 DIAGNOSIS — E538 Deficiency of other specified B group vitamins: Secondary | ICD-10-CM

## 2021-02-07 DIAGNOSIS — K922 Gastrointestinal hemorrhage, unspecified: Secondary | ICD-10-CM | POA: Diagnosis not present

## 2021-02-07 MED ORDER — DIPHENHYDRAMINE HCL 25 MG PO CAPS
ORAL_CAPSULE | ORAL | Status: AC
  Filled 2021-02-07: qty 1

## 2021-02-07 MED ORDER — SODIUM CHLORIDE 0.9 % IV SOLN
510.0000 mg | Freq: Once | INTRAVENOUS | Status: AC
  Administered 2021-02-07: 510 mg via INTRAVENOUS
  Filled 2021-02-07: qty 510

## 2021-02-07 MED ORDER — SODIUM CHLORIDE 0.9 % IV SOLN: Freq: Once | INTRAVENOUS | Status: AC

## 2021-02-07 MED ORDER — DIPHENHYDRAMINE HCL 25 MG PO CAPS
25.0000 mg | ORAL_CAPSULE | Freq: Once | ORAL | Status: AC
  Administered 2021-02-07: 25 mg via ORAL

## 2021-02-07 NOTE — Progress Notes (Signed)
Patient presents today for Feraheme infusion. Vital signs stable. Patient has no complaints today of any changes since her last visit. MAR reviewed. Patient received 25 mg Benadryl PO prior to infusion. See note.

## 2021-02-07 NOTE — Patient Instructions (Signed)
[facility]  Discharge Instructions: Feraheme _______________________________________________________________  Thank you for choosing [facility] at [facility] to provide your oncology and hematology care.  To afford each patient quality time with our providers, please arrive at least 15 minutes before your scheduled appointment.  You need to re-schedule your appointment if you arrive 10 or more minutes late.  We strive to give you quality time with our providers, and arriving late affects you and other patients whose appointments are after yours.  Also, if you no show three or more times for appointments you may be dismissed from the clinic.  Again, thank you for choosing [facility]. Our hope is that these requests will allow you access to exceptional care and in a timely manner. _______________________________________________________________  If you have questions after your visit, please contact our office at [phone] between the hours of 8:30 a.m. and 5:00 p.m. Voicemails left after 4:30 p.m. will not be returned until the following business day. _______________________________________________________________  For prescription refill requests, have your pharmacy contact our office. _______________________________________________________________  Recommendations made by the consultant and any test results will be sent to your referring physician. _______________________________________________________________ 

## 2021-02-07 NOTE — Progress Notes (Signed)
Give 25 mg diphenhydramine today due to age and previous doses has done well.  Henreitta Leber, PharmD

## 2021-02-14 ENCOUNTER — Inpatient Hospital Stay (HOSPITAL_COMMUNITY): Payer: Medicare Other | Attending: Hematology and Oncology

## 2021-02-14 ENCOUNTER — Encounter (HOSPITAL_COMMUNITY): Payer: Self-pay

## 2021-02-14 ENCOUNTER — Other Ambulatory Visit: Payer: Self-pay

## 2021-02-14 VITALS — BP 121/67 | HR 75 | Temp 97.2°F | Resp 18

## 2021-02-14 DIAGNOSIS — E538 Deficiency of other specified B group vitamins: Secondary | ICD-10-CM

## 2021-02-14 DIAGNOSIS — K922 Gastrointestinal hemorrhage, unspecified: Secondary | ICD-10-CM | POA: Insufficient documentation

## 2021-02-14 DIAGNOSIS — D500 Iron deficiency anemia secondary to blood loss (chronic): Secondary | ICD-10-CM | POA: Insufficient documentation

## 2021-02-14 MED ORDER — SODIUM CHLORIDE 0.9 % IV SOLN: Freq: Once | INTRAVENOUS | Status: AC

## 2021-02-14 MED ORDER — DIPHENHYDRAMINE HCL 25 MG PO CAPS
25.0000 mg | ORAL_CAPSULE | Freq: Once | ORAL | Status: AC
  Administered 2021-02-14: 25 mg via ORAL
  Filled 2021-02-14: qty 1

## 2021-02-14 MED ORDER — SODIUM CHLORIDE 0.9 % IV SOLN
510.0000 mg | Freq: Once | INTRAVENOUS | Status: AC
  Administered 2021-02-14: 510 mg via INTRAVENOUS
  Filled 2021-02-14: qty 510

## 2021-02-14 NOTE — Progress Notes (Signed)
Patient presents today for Feraheme infusion. Vital signs are stable. Patient denies any changes since the last iron infusion. Patient denies any complaints today. MAR reviewed and updated. Benadryl 25mg  PO given prior to administration.   Feraheme given today per MD orders. Tolerated infusion without adverse affects. Vital signs stable. No complaints at this time. Discharged from clinic via wheel chair accompanied by son in stable condition. Alert and oriented x 3. F/U with [facility] as scheduled.

## 2021-02-14 NOTE — Patient Instructions (Signed)
[facility]  Discharge Instructions: Feraheme _______________________________________________________________  Thank you for choosing [facility] to provide your oncology and hematology care.  To afford each patient quality time with our providers, please arrive at least 15 minutes before your scheduled appointment.  You need to re-schedule your appointment if you arrive 10 or more minutes late.  We strive to give you quality time with our providers, and arriving late affects you and other patients whose appointments are after yours.  Also, if you no show three or more times for appointments you may be dismissed from the clinic.  Again, thank you for choosing [facility] at [facility]. Our hope is that these requests will allow you access to exceptional care and in a timely manner. _______________________________________________________________  If you have questions after your visit, please contact our office at [phone] between the hours of 8:30 a.m. and 5:00 p.m. Voicemails left after 4:30 p.m. will not be returned until the following business day. _______________________________________________________________  For prescription refill requests, have your pharmacy contact our office. _______________________________________________________________  Recommendations made by the consultant and any test results will be sent to your referring physician. _______________________________________________________________ 

## 2021-02-17 ENCOUNTER — Encounter: Payer: Self-pay | Admitting: Internal Medicine

## 2021-03-22 ENCOUNTER — Observation Stay (HOSPITAL_COMMUNITY)
Admission: EM | Admit: 2021-03-22 | Discharge: 2021-03-23 | Disposition: A | Discharge status: Home / Self Care | Payer: Medicare Other | Attending: Emergency Medicine | Admitting: Emergency Medicine

## 2021-03-22 ENCOUNTER — Other Ambulatory Visit: Payer: Self-pay

## 2021-03-22 ENCOUNTER — Encounter (HOSPITAL_COMMUNITY): Payer: Self-pay | Admitting: Emergency Medicine

## 2021-03-22 DIAGNOSIS — K449 Diaphragmatic hernia without obstruction or gangrene: Secondary | ICD-10-CM | POA: Insufficient documentation

## 2021-03-22 DIAGNOSIS — K259 Gastric ulcer, unspecified as acute or chronic, without hemorrhage or perforation: Secondary | ICD-10-CM | POA: Diagnosis present

## 2021-03-22 DIAGNOSIS — Z20822 Contact with and (suspected) exposure to covid-19: Secondary | ICD-10-CM | POA: Diagnosis not present

## 2021-03-22 DIAGNOSIS — K922 Gastrointestinal hemorrhage, unspecified: Secondary | ICD-10-CM | POA: Diagnosis not present

## 2021-03-22 DIAGNOSIS — Z79899 Other long term (current) drug therapy: Secondary | ICD-10-CM | POA: Insufficient documentation

## 2021-03-22 DIAGNOSIS — K3189 Other diseases of stomach and duodenum: Secondary | ICD-10-CM | POA: Insufficient documentation

## 2021-03-22 DIAGNOSIS — I10 Essential (primary) hypertension: Secondary | ICD-10-CM | POA: Diagnosis not present

## 2021-03-22 DIAGNOSIS — K921 Melena: Secondary | ICD-10-CM | POA: Diagnosis present

## 2021-03-22 LAB — CBC
HCT: 34.2 % — ABNORMAL LOW (ref 36.0–46.0)
HCT: 32.1 % — ABNORMAL LOW (ref 36.0–46.0)
Hemoglobin: 10.9 g/dL — ABNORMAL LOW (ref 12.0–15.0)
Hemoglobin: 10.2 g/dL — ABNORMAL LOW (ref 12.0–15.0)
MCH: 31 pg (ref 26.0–34.0)
MCH: 30.7 pg (ref 26.0–34.0)
MCHC: 31.9 g/dL (ref 30.0–36.0)
MCHC: 31.8 g/dL (ref 30.0–36.0)
MCV: 97.2 fL (ref 80.0–100.0)
MCV: 96.7 fL (ref 80.0–100.0)
Platelets: 311 10*3/uL (ref 150–400)
Platelets: 264 10*3/uL (ref 150–400)
RBC: 3.52 MIL/uL — ABNORMAL LOW (ref 3.87–5.11)
RBC: 3.32 MIL/uL — ABNORMAL LOW (ref 3.87–5.11)
RDW: 15.9 % — ABNORMAL HIGH (ref 11.5–15.5)
RDW: 15.9 % — ABNORMAL HIGH (ref 11.5–15.5)
WBC: 11.6 10*3/uL — ABNORMAL HIGH (ref 4.0–10.5)
WBC: 10.6 10*3/uL — ABNORMAL HIGH (ref 4.0–10.5)
nRBC: 0 % (ref 0.0–0.2)
nRBC: 0 % (ref 0.0–0.2)

## 2021-03-22 LAB — COMPREHENSIVE METABOLIC PANEL
ALT: 22 U/L (ref 0–44)
AST: 24 U/L (ref 15–41)
Albumin: 3.6 g/dL (ref 3.5–5.0)
Alkaline Phosphatase: 73 U/L (ref 38–126)
Anion gap: 8 (ref 5–15)
BUN: 47 mg/dL — ABNORMAL HIGH (ref 8–23)
CO2: 24 mmol/L (ref 22–32)
Calcium: 8.7 mg/dL — ABNORMAL LOW (ref 8.9–10.3)
Chloride: 106 mmol/L (ref 98–111)
Creatinine, Ser: 0.7 mg/dL (ref 0.44–1.00)
GFR, Estimated: >60 mL/min (ref >60)
Glucose, Bld: 103 mg/dL — ABNORMAL HIGH (ref 70–99)
Potassium: 4 mmol/L (ref 3.5–5.1)
Sodium: 138 mmol/L (ref 135–145)
Total Bilirubin: 1.1 mg/dL (ref 0.3–1.2)
Total Protein: 6.5 g/dL (ref 6.5–8.1)

## 2021-03-22 LAB — TYPE AND SCREEN
ABO/RH(D): O POS
Antibody Screen: NEGATIVE

## 2021-03-22 LAB — RESP PANEL BY RT-PCR (FLU A&B, COVID) ARPGX2
Influenza A by PCR: NEGATIVE
Influenza B by PCR: NEGATIVE
SARS Coronavirus 2 by RT PCR: NEGATIVE

## 2021-03-22 LAB — POC OCCULT BLOOD, ED: Fecal Occult Bld: POSITIVE — AB

## 2021-03-22 MED ORDER — ONDANSETRON HCL 4 MG PO TABS: 4.0000 mg | ORAL_TABLET | Freq: Four times a day (QID) | ORAL | Status: DC | PRN

## 2021-03-22 MED ORDER — PANTOPRAZOLE SODIUM 40 MG IV SOLR
40.0000 mg | Freq: Once | INTRAVENOUS | Status: AC
  Administered 2021-03-22: 40 mg via INTRAVENOUS
  Filled 2021-03-22: qty 40

## 2021-03-22 MED ORDER — ONDANSETRON HCL 4 MG/2ML IJ SOLN: 4.0000 mg | Freq: Four times a day (QID) | INTRAMUSCULAR | Status: DC | PRN

## 2021-03-22 MED ORDER — LACTATED RINGERS IV SOLN: INTRAVENOUS | Status: DC

## 2021-03-22 MED ORDER — SUCRALFATE 1 G PO TABS
1.0000 g | ORAL_TABLET | Freq: Three times a day (TID) | ORAL | Status: DC
  Administered 2021-03-22 – 2021-03-23 (×2): 1 g via ORAL
  Filled 2021-03-22 (×3): qty 1

## 2021-03-22 MED ORDER — PANTOPRAZOLE SODIUM 40 MG IV SOLR
40.0000 mg | Freq: Two times a day (BID) | INTRAVENOUS | Status: DC
  Administered 2021-03-22 – 2021-03-23 (×2): 40 mg via INTRAVENOUS
  Filled 2021-03-22 (×3): qty 40

## 2021-03-22 NOTE — ED Triage Notes (Signed)
Patient c/o bloody stools that started last night. Per patient blood dark in color. Patient reports hx of rectal bleeding with blood transfusions and iron injections. Denies any abd pain but does state loose stool and nausea without vomiting. Denies any fevers. Denies taking any type of anticoagulants.

## 2021-03-22 NOTE — ED Provider Notes (Signed)
[facility] EMERGENCY DEPARTMENT Provider Note   CSN: [id number] Arrival date & time: 03/22/21  1301     History Chief Complaint  Patient presents with  . Rectal Bleeding    Wanda [name] is a 87 y.o. female past med history of diverticulosis, gastric polyps, gastritis, hiatal hernia, iron deficiency anemia who presents for evaluation of black tarry stools.  Patient reports that during middle of the night, she had to get up to have a bowel movement and states that when she did, she had black tarry stools.  She states she had a total of 2 episodes of this.  She states that this is happened before.  She was recently admitted to the hospital in February for similar and underwent a EGD.  She has followed up with GI since then.  She is on a PPI as well as Carafate.  She is not on any blood thinners.  She states she has not been using any recent NSAIDs.  She has not any more episodes since the second 1 but states that with her history, she was concerned and wanted to get evaluated.  She states that she has not had any fevers, chest pain, difficulty breathing, abdominal pain, vomiting, urinary complaints.  The history is provided by the patient.       Past Medical History:  Diagnosis Date  . Anemia   . Cystocele   . Diverticulosis 10/20/10   Left-sided on colonoscopy by Dr. Gala Romney 10/20/10  . Gastric polyps 10/02/2013  . Gastritis 12/08/10   EGD by Dr. Altha Harm hiatal hernia, duodenal diverticulum, chronic gastritis  . GERD (gastroesophageal reflux disease)   . Heart murmur   . Hiatal hernia 10/02/2013  . HTN (hypertension)   . IDA (iron deficiency anemia)   . 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 Osteoarthritis   . Radial fracture    (right) Undergoing treatment by Dr. Aline Brochure currently  . Sleep apnea    STOP BANG SCORE 4  . Urge incontinence   . Vitamin D deficiency     Patient Active Problem List   Diagnosis Date Noted  . Upper GI bleed 03/22/2021  . Acute GI  bleeding 12/26/2020  . Acute upper GI bleed 02/11/2020  . Loss of weight 06/28/2019  . Sleep apnea 11/20/2018  . Hypokalemia 11/20/2018  . Leukocytosis 11/20/2018  . Symptomatic anemia 11/04/2018  . Diarrhea, functional 03/15/2015  . Fatigue 12/28/2014  . Upper abdominal pain 12/28/2014  . B12 deficiency 12/28/2014    Class: History of  . Acute blood loss anemia 10/02/2013  . GI bleed 10/02/2013  . Melena 10/02/2013  . Hematemesis 10/02/2013  . Sinus tachycardia 10/02/2013  . 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 Hiatal hernia 10/02/2013  . Gastric polyps 10/02/2013  . HTN (hypertension)   . IDA (iron deficiency anemia)   . Adhesive capsulitis of right shoulder 03/01/2012  . Iron deficiency anemia 02/02/2012  . Distal radius fracture 11/30/2011  . Diverticulosis 10/20/2010  . Normocytic anemia 10/01/2010  . GERD 10/01/2010  . FECAL OCCULT BLOOD 10/01/2010    Past Surgical History:  Procedure Laterality Date  . ABDOMINAL HYSTERECTOMY    . ANTERIOR AND POSTERIOR REPAIR  05/17/2012   Procedure: ANTERIOR (CYSTOCELE) AND POSTERIOR REPAIR (RECTOCELE);  Surgeon: Jonnie Kind, MD;  Location: [facility];  Service: Gynecology;  Laterality: N/A;  . BIOPSY  11/22/2018   Procedure: BIOPSY;  Surgeon: Daneil Dolin, MD;  Location: [facility];  Service: Endoscopy;;  . BIOPSY  02/12/2020  Procedure: BIOPSY;  Surgeon: Daneil Dolin, MD;  Location: [facility];  Service: Endoscopy;;  gastric biopsy  . CATARACT EXTRACTION, BILATERAL    . COLONOSCOPY  02/29/2012   Dr. Gala Romney: colonic diverticulosis, minimal internal hemorrhoids, benign polyp  . ESOPHAGOGASTRODUODENOSCOPY  02/29/2012   Dr. Gala Romney: atonic esophagus, moderate-sized hiatal hernia, fundal gland polyps  . ESOPHAGOGASTRODUODENOSCOPY N/A 10/02/2013   Dr. Gala Romney- multiple Wilhemena Durie gastric polyps- not manipulated  . ESOPHAGOGASTRODUODENOSCOPY N/A 01/14/2015   Dr. Gala Romney: Large hiatal hernia, otherwise normal EGD  .  ESOPHAGOGASTRODUODENOSCOPY (EGD) WITH PROPOFOL N/A 11/22/2018   Dr. Gala Romney: normal esophagus, large hiatal hernia, erosive gastropathy s/p biopsy, normal duodenum. Reactive gastropathy  . ESOPHAGOGASTRODUODENOSCOPY (EGD) WITH PROPOFOL N/A 02/12/2020   normal esophagus, large hiatal hernia, [name] lesions, benign gastric biopsy  . ESOPHAGOGASTRODUODENOSCOPY (EGD) WITH PROPOFOL N/A 12/26/2020   Procedure: ESOPHAGOGASTRODUODENOSCOPY (EGD) WITH PROPOFOL;  Surgeon: Daneil Dolin, MD;  [facility];  Service: Endoscopy;  Laterality: N/A;  . GIVENS CAPSULE STUDY  02/29/2012   No explanation for IDA. Possible extrinsic compression but negative CT.   Marland Kitchen GIVENS CAPSULE STUDY N/A 12/26/2020   Procedure: GIVENS CAPSULE STUDY;  Surgeon: Daneil Dolin, MD;  [facility]: [facility];  Service: Endoscopy;  Laterality: N/A;  . SALPINGOOPHORECTOMY  05/17/2012   Procedure: SALPINGO OOPHERECTOMY;  Surgeon: Jonnie Kind, MD;  Location: [facility];  Service: Gynecology;  Laterality: Bilateral;  . VAGINAL HYSTERECTOMY  05/17/2012   Procedure: HYSTERECTOMY VAGINAL;  Surgeon: Jonnie Kind, MD;  Location: [facility];  Service: Gynecology;  Laterality: N/A;     OB History    Gravida  3   Para  3   Term  3   Preterm      AB      Living  3     SAB      IAB      Ectopic      Multiple      Live Births              Family History  Problem Relation Age of Onset  . Hypertension Mother   . Lymphoma Brother   . Anesthesia problems Neg Hx   . Hypotension Neg Hx   . Malignant hyperthermia Neg Hx   . Pseudochol deficiency Neg Hx   . Colon cancer Neg Hx     Social History   Tobacco Use  . Smoking status: Never Smoker  . Smokeless tobacco: Never Used  . Tobacco comment: Never smoked  Vaping Use  . Vaping Use: Never used  Substance Use Topics  . Alcohol use: No    Alcohol/week: 0.0 standard drinks  . Drug use: No    Home Medications Prior to Admission medications   Medication Sig Start  Date End Date Taking? Authorizing Provider  acetaminophen (TYLENOL) 325 MG tablet Take 2 tablets (650 mg total) by mouth every 6 (six) hours as needed for mild pain (or Fever >/= 101). 12/27/20   Roxan Hockey, MD  CALCIUM PO Take 300-600 mg by mouth.     [provider]  Cholecalciferol (VITAMIN D3) 25 MCG (1000 UT) CAPS Take by mouth daily.    [provider]  ciclopirox (PENLAC) 8 % solution Apply topically. 12/20/20   [provider]  cyanocobalamin (,VITAMIN B-12,) 1000 MCG/ML injection Inject into the muscle. 01/10/21   [provider]  ferrous sulfate (FEROSUL) 325 (65 FE) MG tablet Take 1 tablet (325 mg total) by mouth 2 (two)  times daily with a meal. Take after breakfast and dinner 12/27/20   Roxan Hockey, MD  lisinopril-hydrochlorothiazide (ZESTORETIC) 20-12.5 MG tablet Take 1 tablet by mouth daily. 02/11/21   [provider]  Multiple Vitamin (MULTIVITAMIN) tablet Take 1 tablet by mouth.     [provider]  ondansetron (ZOFRAN) 4 MG tablet Take 1 tablet (4 mg total) by mouth every 8 (eight) hours as needed for nausea or vomiting. 12/27/20   Denton Brick, Courage, MD  pantoprazole (PROTONIX) 40 MG tablet Take 1 tablet (40 mg total) by mouth 2 (two) times daily. 12/27/20 12/27/21  Roxan Hockey, MD  sucralfate (CARAFATE) 1 g tablet Take 1 tablet (1 g total) by mouth 4 (four) times daily -  with meals and at bedtime. 12/27/20   Roxan Hockey, MD    Allergies    Sulfa antibiotics and Naproxen sodium  Review of Systems   Review of Systems  Constitutional: Negative for fever.  Respiratory: Negative for cough and shortness of breath.   Cardiovascular: Negative for chest pain.  Gastrointestinal: Positive for blood in stool. Negative for abdominal pain, nausea and vomiting.  Neurological: Negative for headaches.  All other systems reviewed and are negative.   Physical Exam Updated Vital Signs BP 98/73 (BP Location: Right Arm)    Pulse 93   Temp 98.5 F (36.9 C) (Oral)   Resp 18   Ht 4' 8.5" (1.435 m)   Wt 52.2 kg   SpO2 99%   BMI 25.33 kg/m   Physical Exam Vitals and nursing note reviewed.  Constitutional:      Appearance: Normal appearance. She is well-developed.  HENT:     Head: Normocephalic and atraumatic.  Eyes:     General: Lids are normal.     Conjunctiva/sclera: Conjunctivae normal.     Pupils: Pupils are equal, round, and reactive to light.  Cardiovascular:     Rate and Rhythm: Normal rate and regular rhythm.     Pulses: Normal pulses.     Heart sounds: Normal heart sounds. No murmur heard. No friction rub. No gallop.   Pulmonary:     Effort: Pulmonary effort is normal.     Breath sounds: Normal breath sounds.     Comments: Lungs clear to auscultation bilaterally.  Symmetric chest rise.  No wheezing, rales, rhonchi. Abdominal:     Palpations: Abdomen is soft. Abdomen is not rigid.     Tenderness: There is no abdominal tenderness. There is no guarding.     Comments: Abdomen is soft, non-distended, non-tender. No rigidity, No guarding. No peritoneal signs.  Genitourinary:    Comments: The exam was performed with a chaperone present Velna Hatchet, Therapist, sports).  Dark black stool noted on digital rectal exam.  Small nonthrombosed external hemorrhoid noted. Musculoskeletal:        General: Normal range of motion.     Cervical back: Full passive range of motion without pain.  Skin:    General: Skin is warm and dry.     Capillary Refill: Capillary refill takes less than 2 seconds.  Neurological:     Mental Status: She is alert and oriented to person, place, and time.  Psychiatric:        Speech: Speech normal.     ED Results / Procedures / Treatments   Labs (all labs ordered are listed, but only abnormal results are displayed) Labs Reviewed  COMPREHENSIVE METABOLIC PANEL - Abnormal; Notable for the following components:      Result Value   Glucose, Bld 103 (*)  BUN 47 (*)    Calcium 8.7 (*)     All other components within normal limits  CBC - Abnormal; Notable for the following components:   WBC 11.6 (*)    RBC 3.52 (*)    Hemoglobin 10.9 (*)    HCT 34.2 (*)    RDW 15.9 (*)    All other components within normal limits  POC OCCULT BLOOD, ED - Abnormal; Notable for the following components:   Fecal Occult Bld POSITIVE (*)    All other components within normal limits  RESP PANEL BY RT-PCR (FLU A&B, COVID) [id number]  POC SARS CORONAVIRUS 2 AG -  ED  TYPE AND SCREEN    EKG None  Radiology No results found.  Procedures Procedures   Medications Ordered in ED Medications  pantoprazole (PROTONIX) injection 40 mg (40 mg Intravenous Given by Other 03/22/21 1612)    ED Course  I have reviewed the triage vital signs and the nursing notes.  Pertinent labs & imaging results that were available during my care of the patient were reviewed by me and considered in my medical decision making (see chart for details).    MDM Rules/Calculators/A&P                          87 year old female who presents for evaluation of black tarry stool x2.  History of same.  Last admission was in February 2022.  Follows with GI.  No blood thinner use, recent NSAID use.  On initially arrival, she is afebrile, toxic appearing.  Vital signs are stable.  Abdomen exam is benign.  On GU exam, she does have evidence of black tarry stools noted on digital rectal exam.  Plan to check labs.  Fecal occult is positive. CBC shows leukocytosis of 11.6, Hgb is 10.9. She has had a range Hgb levels from 7-10.   She had an upper endoscopy in February 2020 done by GI that showed Lysbeth Galas lesions as well as a degree of gastritis.  She also was noted to have duodenal diverticulum.  Patient's blood pressure slightly soft.  98/73.  She normally is hypertensive.  At this time, given her age, known history of this and slightly soft blood pressure, feel that observation overnight to trend her hemoglobins and to ensure that this  does not worsen is warranted.  Updated patient on plan.  She is agreeable.  Discussed patient with Dr. Sheran Lawless [facility]) who accepts patient for admission.   Attempted to notify son but was unsuccessful.   Portions of this note were generated with Lobbyist. Dictation errors may occur despite best attempts at proofreading.   Final Clinical Impression(s) / ED Diagnoses Final diagnoses:  Melena    Rx / DC Orders ED Discharge Orders    None       Desma Mcgregor 03/22/21 1703    Noemi Chapel, MD 03/23/21 1757

## 2021-03-22 NOTE — ED Provider Notes (Signed)
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  She has been taking her proton pump inhibitors, unfortunately at this time she has had recurrent melena and on my exam does not fact have a nontender abdomen, she appears generally weak but awake and alert and able to follow commands, she has a borderline hypotension.  We will consult with hospitalist for admission,  IV protonix Fluids Admit  Medical screening examination/treatment/procedure(s) were conducted as a shared visit with non-physician practitioner(s) and myself.  I personally evaluated the patient during the encounter.  Clinical Impression:   Final diagnoses:  None         Noemi Chapel, MD 03/25/21 1553

## 2021-03-22 NOTE — H&P (Signed)
History and Physical  Wanda [name] PXT:[id number] DOB: 01/15/1933 DOA: 03/22/2021  Referring physician: Providence Lanius, PA-C, EDP PCP: Renee Rival, NP  Outpatient Specialists:  Patient Coming From: Home  Chief Complaint: melena  HPI: Wanda [name] is a 87 y.o. female with a history of gastric polyps, gastritis, history of upper GI bleeds, hypertension.  Patient seen for melena that started in the middle of the night.  She had 2-3 episodes of large amount of melena and came in to the hospital for evaluation.  No palliating or provoking factors.  She denies abdominal pain, fevers, chills.  He was last hospitalized back in February for similar episode.  She states that she has been compliant with her medication she has been compliant with Protonix and Carafate.  Emergency Department Course: Hemoglobin 10.9.  Hemoccult positive.  Protonix given  Review of Systems:   Pt denies any fevers, chills, nausea, vomiting, diarrhea, constipation, abdominal pain, shortness of breath, dyspnea on exertion, orthopnea, cough, wheezing, palpitations, headache, vision changes, lightheadedness, dizziness, rectal bleeding.  Review of systems are otherwise negative  Past Medical History:  Diagnosis Date  . Anemia   . Cystocele   . Diverticulosis 10/20/10   Left-sided on colonoscopy by Dr. Gala Romney 10/20/10  . Gastric polyps 10/02/2013  . Gastritis 12/08/10   EGD by Dr. Altha Harm hiatal hernia, duodenal diverticulum, chronic gastritis  . GERD (gastroesophageal reflux disease)   . Heart murmur   . Hiatal hernia 10/02/2013  . HTN (hypertension)   . IDA (iron deficiency anemia)   . 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 Osteoarthritis   . Radial fracture    (right) Undergoing treatment by Dr. Aline Brochure currently  . Sleep apnea    STOP BANG SCORE 4  . Urge incontinence   . Vitamin D deficiency    Past Surgical History:  Procedure Laterality Date  . ABDOMINAL HYSTERECTOMY    . ANTERIOR  AND POSTERIOR REPAIR  05/17/2012   Procedure: ANTERIOR (CYSTOCELE) AND POSTERIOR REPAIR (RECTOCELE);  Surgeon: Jonnie Kind, MD;  Location: [facility];  Service: Gynecology;  Laterality: N/A;  . BIOPSY  11/22/2018   Procedure: BIOPSY;  Surgeon: Daneil Dolin, MD;  [facility];  Service: Endoscopy;;  . BIOPSY  02/12/2020   Procedure: BIOPSY;  Surgeon: Daneil Dolin, MD;  [facility];  Service: Endoscopy;;  gastric biopsy  . CATARACT EXTRACTION, BILATERAL    . COLONOSCOPY  02/29/2012   Dr. Gala Romney: colonic diverticulosis, minimal internal hemorrhoids, benign polyp  . ESOPHAGOGASTRODUODENOSCOPY  02/29/2012   Dr. Gala Romney: atonic esophagus, moderate-sized hiatal hernia, fundal gland polyps  . ESOPHAGOGASTRODUODENOSCOPY N/A 10/02/2013   Dr. Gala Romney- multiple Wilhemena Durie gastric polyps- not manipulated  . ESOPHAGOGASTRODUODENOSCOPY N/A 01/14/2015   Dr. Gala Romney: Large hiatal hernia, otherwise normal EGD  . ESOPHAGOGASTRODUODENOSCOPY (EGD) WITH PROPOFOL N/A 11/22/2018   Dr. Gala Romney: normal esophagus, large hiatal hernia, erosive gastropathy s/p biopsy, normal duodenum. Reactive gastropathy  . ESOPHAGOGASTRODUODENOSCOPY (EGD) WITH PROPOFOL N/A 02/12/2020   normal esophagus, large hiatal hernia, [name] lesions, benign gastric biopsy  . ESOPHAGOGASTRODUODENOSCOPY (EGD) WITH PROPOFOL N/A 12/26/2020   Procedure: ESOPHAGOGASTRODUODENOSCOPY (EGD) WITH PROPOFOL;  Surgeon: Daneil Dolin, MD;  [facility];  Service: Endoscopy;  Laterality: N/A;  . GIVENS CAPSULE STUDY  02/29/2012   No explanation for IDA. Possible extrinsic compression but negative CT.   Marland Kitchen GIVENS CAPSULE STUDY N/A 12/26/2020   Procedure: GIVENS CAPSULE STUDY;  Surgeon: Daneil Dolin, MD;  [facility]: [facility];  Service: Endoscopy;  Laterality: N/A;  . SALPINGOOPHORECTOMY  05/17/2012   Procedure: SALPINGO OOPHERECTOMY;  Surgeon: Jonnie Kind, MD;  Location: [facility];  Service: Gynecology;  Laterality: Bilateral;   . VAGINAL HYSTERECTOMY  05/17/2012   Procedure: HYSTERECTOMY VAGINAL;  Surgeon: Jonnie Kind, MD;  Location: [facility];  Service: Gynecology;  Laterality: N/A;   Social History:  reports that she has never smoked. She has never used smokeless tobacco. She reports that she does not drink alcohol and does not use drugs. Patient lives at home  Allergies  Allergen Reactions  . Sulfa Antibiotics     unknown  . Naproxen Sodium Rash and Other (See Comments)    Passed out    Family History  Problem Relation Age of Onset  . Hypertension Mother   . Lymphoma Brother   . Anesthesia problems Neg Hx   . Hypotension Neg Hx   . Malignant hyperthermia Neg Hx   . Pseudochol deficiency Neg Hx   . Colon cancer Neg Hx       Prior to Admission medications   Medication Sig Start Date End Date Taking? Authorizing Provider  acetaminophen (TYLENOL) 325 MG tablet Take 2 tablets (650 mg total) by mouth every 6 (six) hours as needed for mild pain (or Fever >/= 101). 12/27/20   Roxan Hockey, MD  CALCIUM PO Take 300-600 mg by mouth.     [provider]  Cholecalciferol (VITAMIN D3) 25 MCG (1000 UT) CAPS Take by mouth daily.    [provider]  ciclopirox (PENLAC) 8 % solution Apply topically. 12/20/20   [provider]  cyanocobalamin (,VITAMIN B-12,) 1000 MCG/ML injection Inject into the muscle. 01/10/21   [provider]  ferrous sulfate (FEROSUL) 325 (65 FE) MG tablet Take 1 tablet (325 mg total) by mouth 2 (two) times daily with a meal. Take after breakfast and dinner 12/27/20   Roxan Hockey, MD  lisinopril-hydrochlorothiazide (ZESTORETIC) 20-12.5 MG tablet Take 1 tablet by mouth daily. 02/11/21   [provider]  Multiple Vitamin (MULTIVITAMIN) tablet Take 1 tablet by mouth.     [provider]  ondansetron (ZOFRAN) 4 MG tablet Take 1 tablet (4 mg total) by mouth every 8 (eight) hours as needed for nausea or vomiting. 12/27/20   Denton Brick, Courage, MD   pantoprazole (PROTONIX) 40 MG tablet Take 1 tablet (40 mg total) by mouth 2 (two) times daily. 12/27/20 12/27/21  Roxan Hockey, MD  sucralfate (CARAFATE) 1 g tablet Take 1 tablet (1 g total) by mouth 4 (four) times daily -  with meals and at bedtime. 12/27/20   Roxan Hockey, MD    Physical Exam: BP 98/73 (BP Location: Right Arm)   Pulse 93   Temp 98.5 F (36.9 C) (Oral)   Resp 18   Ht 4' 8.5" (1.435 m)   Wt 52.2 kg   SpO2 99%   BMI 25.33 kg/m   . General: Elderly female. Awake and alert and oriented x3. No acute cardiopulmonary distress.  Marland Kitchen HEENT: Normocephalic atraumatic.  Right and left ears normal in appearance.  Pupils equal, round, reactive to light. Extraocular muscles are intact. Sclerae anicteric and noninjected.  Moist mucosal membranes. No mucosal lesions.  . Neck: Neck supple without lymphadenopathy. No carotid bruits. No masses palpated.  . Cardiovascular: Regular rate with normal S1-S2 sounds. No murmurs, rubs, gallops auscultated. No JVD.  Marland Kitchen Respiratory: Good respiratory effort with no wheezes, rales, rhonchi. Lungs clear to auscultation bilaterally.  No accessory muscle use. Marland Kitchen  Abdomen: Soft, nontender, nondistended. Active bowel sounds. No masses or hepatosplenomegaly  . Skin: No rashes, lesions, or ulcerations.  Dry, warm to touch. 2+ dorsalis pedis and radial pulses. . Musculoskeletal: No calf or leg pain. All major joints not erythematous nontender.  No upper or lower joint deformation.  Good ROM.  No contractures  . Psychiatric: Intact judgment and insight. Pleasant and cooperative. . Neurologic: No focal neurological deficits. Strength is 5/5 and symmetric in upper and lower extremities.  Cranial nerves II through XII are grossly intact.           Labs on Admission: I have personally reviewed following labs and imaging studies  CBC: Recent Labs  Lab 03/22/21 1408  WBC 11.6*  HGB 10.9*  HCT 34.2*  MCV 97.2  PLT 510   Basic Metabolic Panel: Recent  Labs  Lab 03/22/21 1408  NA 138  K 4.0  CL 106  CO2 24  GLUCOSE 103*  BUN 47*  CREATININE 0.70  CALCIUM 8.7*   GFR: Estimated Creatinine Clearance: 35.2 mL/min (by C-G formula based on SCr of 0.7 mg/dL). Liver Function Tests: Recent Labs  Lab 03/22/21 1408  AST 24  ALT 22  ALKPHOS 73  BILITOT 1.1  PROT 6.5  ALBUMIN 3.6   No results for input(s): LIPASE, AMYLASE in the last 168 hours. No results for input(s): AMMONIA in the last 168 hours. Coagulation Profile: No results for input(s): INR, PROTIME in the last 168 hours. Cardiac Enzymes: No results for input(s): CKTOTAL, CKMB, CKMBINDEX, TROPONINI in the last 168 hours. BNP (last 3 results) No results for input(s): PROBNP in the last 8760 hours. HbA1C: No results for input(s): HGBA1C in the last 72 hours. CBG: No results for input(s): GLUCAP in the last 168 hours. Lipid Profile: No results for input(s): CHOL, HDL, LDLCALC, TRIG, CHOLHDL, LDLDIRECT in the last 72 hours. Thyroid Function Tests: No results for input(s): TSH, T4TOTAL, FREET4, T3FREE, THYROIDAB in the last 72 hours. Anemia Panel: No results for input(s): VITAMINB12, FOLATE, FERRITIN, TIBC, IRON, RETICCTPCT in the last 72 hours. Urine analysis:    Component Value Date/Time   COLORURINE YELLOW 11/05/2018 [place] 11/05/2018 1029   LABSPEC 1.008 11/05/2018 1029   PHURINE 8.0 11/05/2018 1029   GLUCOSEU NEGATIVE 11/05/2018 1029   HGBUR NEGATIVE 11/05/2018 1029   BILIRUBINUR NEGATIVE 11/05/2018 1029   KETONESUR NEGATIVE 11/05/2018 1029   PROTEINUR NEGATIVE 11/05/2018 1029   UROBILINOGEN 0.2 05/11/2012 1000   NITRITE NEGATIVE 11/05/2018 1029   LEUKOCYTESUR NEGATIVE 11/05/2018 1029   Sepsis Labs: @LABRCNTIP (procalcitonin:4,lacticidven:4) )No results found for this or any previous visit (from the past 240 hour(s)).   Radiological Exams on Admission: No results found.  Assessment/Plan: Principal Problem:   Upper GI bleed Active  Problems:   HTN (hypertension)   Multiple gastric erosions    This patient was discussed with the ED physician, including pertinent vitals, physical exam findings, labs, and imaging.  We also discussed care given by the ED provider.  1. Upper GI bleed a. Type and screen done b. Observation c. Serial blood counts d. Protonix every 12 hours e. Continue Carafate f. Consult GI 2. Hypertension a. Lower blood pressure b. Hold antihypertensives 3. Multiple gastric erosions  DVT prophylaxis: SCDs Consultants: GI Code Status: Full code Family Communication: Unable to reach son Disposition Plan: Be able to return home after stabilization.   Truett Mainland, DO

## 2021-03-23 ENCOUNTER — Observation Stay (HOSPITAL_COMMUNITY): Payer: Medicare Other | Admitting: Anesthesiology

## 2021-03-23 ENCOUNTER — Encounter (HOSPITAL_COMMUNITY)
Admission: EM | Disposition: A | Discharge status: Home / Self Care | Payer: Self-pay | Source: Home / Self Care | Attending: Family Medicine

## 2021-03-23 DIAGNOSIS — Z20822 Contact with and (suspected) exposure to covid-19: Secondary | ICD-10-CM | POA: Diagnosis not present

## 2021-03-23 DIAGNOSIS — K449 Diaphragmatic hernia without obstruction or gangrene: Secondary | ICD-10-CM | POA: Diagnosis not present

## 2021-03-23 DIAGNOSIS — K922 Gastrointestinal hemorrhage, unspecified: Secondary | ICD-10-CM | POA: Diagnosis not present

## 2021-03-23 DIAGNOSIS — K253 Acute gastric ulcer without hemorrhage or perforation: Secondary | ICD-10-CM | POA: Diagnosis not present

## 2021-03-23 DIAGNOSIS — K259 Gastric ulcer, unspecified as acute or chronic, without hemorrhage or perforation: Secondary | ICD-10-CM | POA: Diagnosis not present

## 2021-03-23 DIAGNOSIS — K3189 Other diseases of stomach and duodenum: Secondary | ICD-10-CM

## 2021-03-23 DIAGNOSIS — I10 Essential (primary) hypertension: Secondary | ICD-10-CM

## 2021-03-23 DIAGNOSIS — K921 Melena: Secondary | ICD-10-CM | POA: Diagnosis not present

## 2021-03-23 HISTORY — PX: ESOPHAGOGASTRODUODENOSCOPY (EGD) WITH PROPOFOL: SHX5813

## 2021-03-23 LAB — CBC
HCT: 28.5 % — ABNORMAL LOW (ref 36.0–46.0)
HCT: 32.1 % — ABNORMAL LOW (ref 36.0–46.0)
HCT: 29.7 % — ABNORMAL LOW (ref 36.0–46.0)
Hemoglobin: 9.1 g/dL — ABNORMAL LOW (ref 12.0–15.0)
Hemoglobin: 10.1 g/dL — ABNORMAL LOW (ref 12.0–15.0)
Hemoglobin: 9.5 g/dL — ABNORMAL LOW (ref 12.0–15.0)
MCH: 31 pg (ref 26.0–34.0)
MCH: 30.7 pg (ref 26.0–34.0)
MCH: 31 pg (ref 26.0–34.0)
MCHC: 31.9 g/dL (ref 30.0–36.0)
MCHC: 31.5 g/dL (ref 30.0–36.0)
MCHC: 32 g/dL (ref 30.0–36.0)
MCV: 96.9 fL (ref 80.0–100.0)
MCV: 97.6 fL (ref 80.0–100.0)
MCV: 97.1 fL (ref 80.0–100.0)
Platelets: 247 10*3/uL (ref 150–400)
Platelets: 291 10*3/uL (ref 150–400)
Platelets: 260 10*3/uL (ref 150–400)
RBC: 2.94 MIL/uL — ABNORMAL LOW (ref 3.87–5.11)
RBC: 3.29 MIL/uL — ABNORMAL LOW (ref 3.87–5.11)
RBC: 3.06 MIL/uL — ABNORMAL LOW (ref 3.87–5.11)
RDW: 16.1 % — ABNORMAL HIGH (ref 11.5–15.5)
RDW: 16.3 % — ABNORMAL HIGH (ref 11.5–15.5)
RDW: 16.1 % — ABNORMAL HIGH (ref 11.5–15.5)
WBC: 9.1 10*3/uL (ref 4.0–10.5)
WBC: 9.1 10*3/uL (ref 4.0–10.5)
WBC: 7.7 10*3/uL (ref 4.0–10.5)
nRBC: 0 % (ref 0.0–0.2)
nRBC: 0 % (ref 0.0–0.2)
nRBC: 0 % (ref 0.0–0.2)

## 2021-03-23 LAB — PHOSPHORUS: Phosphorus: 3.8 mg/dL (ref 2.5–4.6)

## 2021-03-23 LAB — MAGNESIUM: Magnesium: 1.8 mg/dL (ref 1.7–2.4)

## 2021-03-23 SURGERY — ESOPHAGOGASTRODUODENOSCOPY (EGD) WITH PROPOFOL
Anesthesia: General

## 2021-03-23 MED ORDER — SODIUM CHLORIDE 0.9 % IV BOLUS
500.0000 mL | Freq: Once | INTRAVENOUS | Status: AC
  Administered 2021-03-23: 500 mL via INTRAVENOUS

## 2021-03-23 MED ORDER — LACTATED RINGERS IV SOLN: INTRAVENOUS | Status: DC | PRN

## 2021-03-23 MED ORDER — LACTATED RINGERS IV SOLN: INTRAVENOUS | Status: DC

## 2021-03-23 MED ORDER — PROPOFOL 500 MG/50ML IV EMUL
INTRAVENOUS | Status: DC | PRN
  Administered 2021-03-23: 200 ug/kg/min via INTRAVENOUS

## 2021-03-23 MED ORDER — SUCRALFATE 1 GM/10ML PO SUSP: 1.0000 g | Freq: Three times a day (TID) | ORAL | Status: DC

## 2021-03-23 MED ORDER — METOCLOPRAMIDE HCL 5 MG/ML IJ SOLN
10.0000 mg | Freq: Once | INTRAMUSCULAR | Status: AC
  Administered 2021-03-23: 10 mg via INTRAVENOUS
  Filled 2021-03-23: qty 2

## 2021-03-23 MED ORDER — PROPOFOL 10 MG/ML IV BOLUS
INTRAVENOUS | Status: AC
  Filled 2021-03-23: qty 40

## 2021-03-23 MED ORDER — MAGNESIUM SULFATE 2 GM/50ML IV SOLN: 2.0000 g | Freq: Once | INTRAVENOUS | Status: DC

## 2021-03-23 MED ORDER — OMEPRAZOLE MAGNESIUM 20 MG PO TBEC: 40.0000 mg | DELAYED_RELEASE_TABLET | Freq: Two times a day (BID) | ORAL | 0 refills | Status: DC

## 2021-03-23 MED ORDER — ONDANSETRON HCL 4 MG PO TABS: 4.0000 mg | ORAL_TABLET | Freq: Four times a day (QID) | ORAL | 0 refills | Status: DC | PRN

## 2021-03-23 MED ORDER — PROPOFOL 10 MG/ML IV BOLUS
INTRAVENOUS | Status: DC | PRN
  Administered 2021-03-23: 50 mg via INTRAVENOUS
  Administered 2021-03-23 (×2): 20 mg via INTRAVENOUS

## 2021-03-23 MED ORDER — STERILE WATER FOR IRRIGATION IR SOLN
Status: DC | PRN
  Administered 2021-03-23: 200 mL

## 2021-03-23 MED ORDER — LIDOCAINE HCL (PF) 2 % IJ SOLN
INTRAMUSCULAR | Status: AC
  Filled 2021-03-23: qty 5

## 2021-03-23 MED ORDER — PHENYLEPHRINE 40 MCG/ML (10ML) SYRINGE FOR IV PUSH (FOR BLOOD PRESSURE SUPPORT)
PREFILLED_SYRINGE | INTRAVENOUS | Status: DC | PRN
  Administered 2021-03-23 (×3): 80 ug via INTRAVENOUS

## 2021-03-23 MED ORDER — SODIUM CHLORIDE 0.9 % IV SOLN: INTRAVENOUS | Status: DC

## 2021-03-23 MED ORDER — LIDOCAINE HCL (CARDIAC) PF 100 MG/5ML IV SOSY
PREFILLED_SYRINGE | INTRAVENOUS | Status: DC | PRN
  Administered 2021-03-23: 50 mg via INTRAVENOUS

## 2021-03-23 MED ORDER — SUCRALFATE 1 GM/10ML PO SUSP: 1.0000 g | Freq: Three times a day (TID) | ORAL | 0 refills | Status: DC

## 2021-03-23 NOTE — Anesthesia Preprocedure Evaluation (Signed)
Anesthesia Evaluation  Patient identified by MRN, date of birth, ID band Patient awake    Reviewed: Allergy & Precautions, NPO status , Patient's Chart, lab work & pertinent test results  History of Anesthesia Complications Negative for: history of anesthetic complications  Airway Mallampati: II  TM Distance: >3 FB Neck ROM: Full    Dental  (+) Dental Advisory Given, Missing, Caps   Pulmonary sleep apnea ,    Pulmonary exam normal breath sounds clear to auscultation       Cardiovascular Exercise Tolerance: Good hypertension, Pt. on medications Normal cardiovascular exam+ Valvular Problems/Murmurs (systolic murmur)  Rhythm:Regular Rate:Normal + Systolic murmurs [id number] 19:06:43 [facility]-AP-ER ROUTINE RECORD Sinus rhythm Multiple premature complexes, vent & supraven Low voltage, precordial leads Confirmed by Randal Buba, April ([id number]) on 02/12/2020 8:17:43 AM   Neuro/Psych negative neurological ROS  negative psych ROS   GI/Hepatic Neg liver ROS, hiatal hernia, PUD, GERD  Medicated and Controlled,  Endo/Other  negative endocrine ROS  Renal/GU negative Renal ROS     Musculoskeletal  (+) Arthritis , Osteoarthritis,    Abdominal   Peds  Hematology  (+) Blood dyscrasia, anemia ,   Anesthesia Other Findings   Reproductive/Obstetrics                             Anesthesia Physical  Anesthesia Plan  ASA: III and emergent  Anesthesia Plan: General   Post-op Pain Management:    Induction: Intravenous  PONV Risk Score and Plan: 2 and Propofol infusion  Airway Management Planned: Nasal Cannula and Natural Airway  Additional Equipment:   Intra-op Plan:   Post-operative Plan:   Informed Consent: I have reviewed the patients History and Physical, chart, labs and discussed the procedure including the risks, benefits and alternatives for the proposed anesthesia with the  patient or authorized representative who has indicated his/her understanding and acceptance.     Dental advisory given  Plan Discussed with: Surgeon  Anesthesia Plan Comments:         Anesthesia Quick Evaluation

## 2021-03-23 NOTE — Anesthesia Postprocedure Evaluation (Signed)
Anesthesia Post Note  Patient: LAURAJEAN HOSEK  Procedure(s) Performed: ESOPHAGOGASTRODUODENOSCOPY (EGD) WITH PROPOFOL (N/A )  Patient location during evaluation: PACU Anesthesia Type: General Level of consciousness: awake and alert and oriented Pain management: pain level controlled Vital Signs Assessment: post-procedure vital signs reviewed and stable Respiratory status: spontaneous breathing and respiratory function stable Cardiovascular status: blood pressure returned to baseline and stable Postop Assessment: no apparent nausea or vomiting Anesthetic complications: no   No complications documented.   Last Vitals:  Vitals:   03/23/21 1227 03/23/21 1231  BP: (!) 96/58 (!) 102/58  Pulse: 70 69  Resp: 19 15  Temp:    SpO2: 94% 96%    Last Pain:  Vitals:   03/23/21 1300  TempSrc:   PainSc: Asleep                 Toua Stites C Kalaysia Demonbreun

## 2021-03-23 NOTE — Discharge Summary (Signed)
Physician Discharge Summary  Wanda [name] [id number] DOB: 1933/01/19 DOA: 03/22/2021  PCP: Renee Rival, NP  Admit date: 03/22/2021 Discharge date: 03/23/2021  Admitted From: Home Disposition: Home  Recommendations for Outpatient Follow-up:  1. Follow up with PCP in 1-2 weeks 2. Follow up with Gastroenterology in 3-4 weeks 3. Follow up with [facility] Surgery to discuss possible Hiatal Hernia Repair  4. Please obtain CMP/CBC, Mag, Phos in one week 5. Please follow up on the following pending results:  Home Health: No Equipment/Devices: None    Discharge Condition: Stable  CODE STATUS: FULL CODE  Diet recommendation: Soft Heart Healthy Diet  Brief/Interim Summary: HPI per Dr. Loma Boston on 03/22/21 HPI: Wanda [name] is a 87 y.o. female with a history of gastric polyps, gastritis, history of upper GI bleeds, hypertension.  Patient seen for melena that started in the middle of the night.  She had 2-3 episodes of large amount of melena and came in to the hospital for evaluation.  No palliating or provoking factors.  She denies abdominal pain, fevers, chills.  He was last hospitalized back in February for similar episode.  She states that she has been compliant with her medication she has been compliant with Protonix and Carafate.  Emergency Department Course: Hemoglobin 10.9.  Hemoccult positive.  Protonix given  **Interim History Gastroenterology was consulted and took the patient for an EGD which she tolerated well.  She is found to have a very large hiatal hernia with involvement of at least 50% of the gastric chamber.  There is presence of multiple [name] ulcers which were nonbleeding were present with stigmata of bleeding as there is evidence of coffee-ground contents attached to some of them.  The rest of the stomach looked normal otherwise and there is no alteration in the duodenum and she was returned to the hospital floor and given a diet.  GI  recommended starting the patient on omeprazole 40 mg p.o. twice daily and using sucralfate suspension 1 g p.o. 4 times daily.  Recommendation is for her to have an outpatient consideration of surgical repair of her hiatal hernia and follow-up with the GI clinic in 3 to 4 weeks.  She is deemed medically stable to be discharged and tolerating her diet and ambulating without issues.  Discharge Diagnoses:  Principal Problem:   Upper GI bleed Active Problems:   HTN (hypertension)   Multiple gastric erosions   UGIB (upper gastrointestinal bleed)  Upper GI bleed Normocytic anemia a. Type and screen done b. Observation c. Serial blood counts; FOBT was positive d. Protonix every 12 hours and changed to p.o. e. Continue Carafate f. Hemoglobin/hematocrit is relatively stable and at the time of discharge is 9.5/29.7 g. Consult GI and went for an EGD which showed multiple [name] ulcers with stigmata of recent bleeding but no active bleeding.  GI recommended continuing PPI twice daily as well as Carafate and follow-up with surgery in outpatient setting as she is stable  Hypertension a. Lower blood pressure b. Hold antihypertensives while hospitalize c. If blood pressure improves resume at discharge and follow-up with PCP  Multiple gastric erosions -Needs to follow-up with general surgeon outpatient setting for evaluation of hiatal hernia repair  Leukocytosis -Likely reactive in the setting of GI bleeding is now resolved as patient's WBC has gone from 11.6-7.7  Discharge Instructions  Discharge Instructions    Call MD for:  difficulty breathing, headache or visual disturbances   Complete by: As directed    Call  MD for:  extreme fatigue   Complete by: As directed    Call MD for:  hives   Complete by: As directed    Call MD for:  persistant dizziness or light-headedness   Complete by: As directed    Call MD for:  persistant nausea and vomiting   Complete by: As directed    Call MD for:   redness, tenderness, or signs of infection (pain, swelling, redness, odor or green/yellow discharge around incision site)   Complete by: As directed    Call MD for:  severe uncontrolled pain   Complete by: As directed    Call MD for:  temperature >100.4   Complete by: As directed    Diet - low sodium heart healthy   Complete by: As directed    Discharge instructions   Complete by: As directed    You were cared for by a hospitalist during your hospital stay. If you have any questions about your discharge medications or the care you received while you were in the hospital after you are discharged, you can call the unit and ask to speak with the hospitalist on call if the hospitalist that took care of you is not available. Once you are discharged, your primary care physician will handle any further medical issues. Please note that NO REFILLS for any discharge medications will be authorized once you are discharged, as it is imperative that you return to your primary care physician (or establish a relationship with a primary care physician if you do not have one) for your aftercare needs so that they can reassess your need for medications and monitor your lab values.  Follow up with PCP, Gastroenterology, and General Surgery within 1-2 weeks. Take all medications as prescribed. If symptoms change or worsen please return to the ED for evaluation   Increase activity slowly   Complete by: As directed      Allergies as of 03/23/2021      Reactions   Sulfa Antibiotics    unknown   Naproxen Sodium Rash, Other (See Comments)   Passed out      Medication List    STOP taking these medications   pantoprazole 40 MG tablet Commonly known as: Protonix   sucralfate 1 g tablet Commonly known as: Carafate Replaced by: sucralfate 1 GM/10ML suspension     TAKE these medications   acetaminophen 325 MG tablet Commonly known as: TYLENOL Take 2 tablets (650 mg total) by mouth every 6 (six) hours as needed  for mild pain (or Fever >/= 101).   CALCIUM PO Take 300-600 mg by mouth.   ciclopirox 8 % solution Commonly known as: PENLAC Apply topically.   cyanocobalamin 1000 MCG/ML injection Commonly known as: (VITAMIN B-12) Inject 1,000 mcg into the muscle every 30 (thirty) days.   ferrous sulfate 325 (65 FE) MG tablet Commonly known as: FeroSul Take 1 tablet (325 mg total) by mouth 2 (two) times daily with a meal. Take after breakfast and dinner   lisinopril-hydrochlorothiazide 20-12.5 MG tablet Commonly known as: ZESTORETIC Take 1 tablet by mouth daily.   multivitamin tablet Take 1 tablet by mouth.   omeprazole 20 MG tablet Commonly known as: PriLOSEC OTC Take 2 tablets (40 mg total) by mouth in the morning and at bedtime.   ondansetron 4 MG tablet Commonly known as: ZOFRAN Take 1 tablet (4 mg total) by mouth every 6 (six) hours as needed for nausea. What changed:   when to take this  reasons to  take this   sucralfate 1 GM/10ML suspension Commonly known as: CARAFATE Take 10 mLs (1 g total) by mouth 4 (four) times daily -  with meals and at bedtime. Replaces: sucralfate 1 g tablet   Vitamin D3 25 MCG (1000 UT) Caps Take by mouth daily.       Follow-up Information    Renee Rival, NP. Call.   Specialty: Nurse Practitioner Why: Follow up within 1-2 weeks Contact information: [address] [place] [address] [phone]        Surgery, [place] Follow up.   Specialty: General Surgery Why: Please Call to set up appointment for discussion about Hiatial Hernia Repair Contact information: [address] [place]  [place] [address] [phone]        Daneil Dolin, MD. Call.   Specialty: Gastroenterology Why: Follow up in 3-4 weeks Contact information: [address] [place] [address] [phone]              Allergies  Allergen Reactions  . Sulfa Antibiotics     unknown  . Naproxen Sodium Rash and Other (See  Comments)    Passed out    Consultations:  Gastroenterology Dr. Jenetta Downer  Procedures/Studies:  No results found. EGD Findings:      The examined esophagus was normal.      A large hiatal hernia involving 50% of the gastric chamber with multiple       [name] ulcers was found. None of the [name] ulcers were bleeding but       they had presence of stigmata of recent bleeding as there was presence       of some coofee ground contents.      A deformity was found in the gastric body due to the severity of the       haital hernia.      The examined duodenum was normal. Impression:               - Normal esophagus.                           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                           - Deformity in the gastric body.                           - Normal examined duodenum.                           - No specimens collected. Moderate Sedation:      Per Anesthesia Care Recommendation:           - Return patient to hospital ward for ongoing care.                           - Resume previous diet.                           - Use Prilosec (omeprazole) 40 mg PO BID.                           - Use sucralfate suspension 1 gram PO QID.                           -  May consider surgical repair of hiatal hernia.  Subjective: Seen and examined at bedside and was awaiting for her EGD to be done.  After EGD she tolerated her diet and ambulating without issues.  GI recommended discharging and following with the surgeon outpatient setting.  At this time she is medically stable to be discharged home and follow-up with PCP and general surgery as well as gastroenterology in outpatient setting.  Discharge Exam: Vitals:   03/23/21 1227 03/23/21 1231  BP: (!) 96/58 (!) 102/58  Pulse: 70 69  Resp: 19 15  Temp:    SpO2: 94% 96%   Vitals:   03/23/21 1153 03/23/21 1218 03/23/21 1227 03/23/21 1231  BP: 124/61 (!) 91/50 (!) 96/58 (!) 102/58  Pulse: 81 74 70 69  Resp: 16  (!) 21 19 15   Temp: 98 F (36.7 C) 97.9 F (36.6 C)    TempSrc: Oral     SpO2: 95% 100% 94% 96%  Weight:      Height:       General: Pt is alert, awake, not in acute distress Cardiovascular: RRR, S1/S2 +, no rubs, no gallops Respiratory: Diminished bilaterally, no wheezing, no rhonchi; mildly coarse breath sounds but unlabored breathing Abdominal: Soft, NT, nontender, bowel sounds + Extremities: no edema, no cyanosis  The results of significant diagnostics from this hospitalization (including imaging, microbiology, ancillary and laboratory) are listed below for reference.    Microbiology: Recent Results (from the past 240 hour(s))  Resp Panel by RT-PCR (Flu A&B, Covid) Nasopharyngeal Swab     Status: None   Collection Time: 03/22/21  4:08 PM   Specimen: Nasopharyngeal Swab; Nasopharyngeal(NP) swabs in vial transport medium  Result Value Ref Range Status   SARS Coronavirus 2 by RT PCR NEGATIVE NEGATIVE Final    Comment: (NOTE) SARS-CoV-2 target nucleic acids are NOT DETECTED.  The SARS-CoV-2 RNA is generally detectable in upper respiratory specimens during the acute phase of infection. The lowest concentration of SARS-CoV-2 viral copies this assay can detect is 138 copies/mL. A negative result does not preclude SARS-Cov-2 infection and should not be used as the sole basis for treatment or other patient management decisions. A negative result may occur with  improper specimen collection/handling, submission of specimen other than nasopharyngeal swab, presence of viral mutation(s) within the areas targeted by this assay, and inadequate number of viral copies(<138 copies/mL). A negative result must be combined with clinical observations, patient history, and epidemiological information. The expected result is Negative.  Fact Sheet for Patients:  EntrepreneurPulse.com.au  Fact Sheet for Healthcare Providers:  IncredibleEmployment.be  This  test is no t yet approved or cleared by the [place] FDA and  has been authorized for detection and/or diagnosis of SARS-CoV-2 by FDA under an Emergency Use Authorization (EUA). This EUA will remain  in effect (meaning this test can be used) for the duration of the COVID-19 declaration under Section 564(b)(1) of the Act, 21 U.S.C.section 360bbb-3(b)(1), unless the authorization is terminated  or revoked sooner.       Influenza A by PCR NEGATIVE NEGATIVE Final   Influenza B by PCR NEGATIVE NEGATIVE Final    Comment: (NOTE) The Xpert Xpress SARS-CoV-2/FLU/RSV plus assay is intended as an aid in the diagnosis of influenza from Nasopharyngeal swab specimens and should not be used as a sole basis for treatment. Nasal washings and aspirates are unacceptable for Xpert Xpress SARS-CoV-2/FLU/RSV testing.  Fact Sheet for Patients: EntrepreneurPulse.com.au  Fact Sheet for Healthcare Providers: IncredibleEmployment.be  This test is not yet  approved or cleared by the [place] and has been authorized for detection and/or diagnosis of SARS-CoV-2 by FDA under an Emergency Use Authorization (EUA). This EUA will remain in effect (meaning this test can be used) for the duration of the COVID-19 declaration under Section 564(b)(1) of the Act, 21 U.S.C. section 360bbb-3(b)(1), unless the authorization is terminated or revoked.  Performed at [facility], [address], [place], [place] [address]     Labs: BNP (last 3 results) No results for input(s): BNP in the last 8760 hours. Basic Metabolic Panel: Recent Labs  Lab 03/22/21 1408 03/23/21 0600  NA 138  --   K 4.0  --   CL 106  --   CO2 24  --   GLUCOSE 103*  --   BUN 47*  --   CREATININE 0.70  --   CALCIUM 8.7*  --   MG  --  1.8  PHOS  --  3.8   Liver Function Tests: Recent Labs  Lab 03/22/21 1408  AST 24  ALT 22  ALKPHOS 73  BILITOT 1.1  PROT 6.5  ALBUMIN 3.6   No results  for input(s): LIPASE, AMYLASE in the last 168 hours. No results for input(s): AMMONIA in the last 168 hours. CBC: Recent Labs  Lab 03/22/21 1408 03/22/21 1856 03/23/21 0059 03/23/21 0600 03/23/21 1255  WBC 11.6* 10.6* 9.1 9.1 7.7  HGB 10.9* 10.2* 9.1* 10.1* 9.5*  HCT 34.2* 32.1* 28.5* 32.1* 29.7*  MCV 97.2 96.7 96.9 97.6 97.1  PLT 311 264 247 291 260   Cardiac Enzymes: No results for input(s): CKTOTAL, CKMB, CKMBINDEX, TROPONINI in the last 168 hours. BNP: Invalid input(s): POCBNP CBG: No results for input(s): GLUCAP in the last 168 hours. D-Dimer No results for input(s): DDIMER in the last 72 hours. Hgb A1c No results for input(s): HGBA1C in the last 72 hours. Lipid Profile No results for input(s): CHOL, HDL, LDLCALC, TRIG, CHOLHDL, LDLDIRECT in the last 72 hours. Thyroid function studies No results for input(s): TSH, T4TOTAL, T3FREE, THYROIDAB in the last 72 hours.  Invalid input(s): FREET3 Anemia work up No results for input(s): VITAMINB12, FOLATE, FERRITIN, TIBC, IRON, RETICCTPCT in the last 72 hours. Urinalysis    Component Value Date/Time   COLORURINE YELLOW 11/05/2018 [place] 11/05/2018 1029   LABSPEC 1.008 11/05/2018 1029   PHURINE 8.0 11/05/2018 1029   GLUCOSEU NEGATIVE 11/05/2018 1029   HGBUR NEGATIVE 11/05/2018 1029   BILIRUBINUR NEGATIVE 11/05/2018 1029   KETONESUR NEGATIVE 11/05/2018 1029   PROTEINUR NEGATIVE 11/05/2018 1029   UROBILINOGEN 0.2 05/11/2012 1000   NITRITE NEGATIVE 11/05/2018 1029   LEUKOCYTESUR NEGATIVE 11/05/2018 1029   Sepsis Labs Invalid input(s): PROCALCITONIN,  WBC,  LACTICIDVEN Microbiology Recent Results (from the past 240 hour(s))  Resp Panel by RT-PCR (Flu A&B, Covid) Nasopharyngeal Swab     Status: None   Collection Time: 03/22/21  4:08 PM   Specimen: Nasopharyngeal Swab; Nasopharyngeal(NP) swabs in vial transport medium  Result Value Ref Range Status   SARS Coronavirus 2 by RT PCR NEGATIVE NEGATIVE Final     Comment: (NOTE) SARS-CoV-2 target nucleic acids are NOT DETECTED.  The SARS-CoV-2 RNA is generally detectable in upper respiratory specimens during the acute phase of infection. The lowest concentration of SARS-CoV-2 viral copies this assay can detect is 138 copies/mL. A negative result does not preclude SARS-Cov-2 infection and should not be used as the sole basis for treatment or other patient management decisions. A negative result may occur  with  improper specimen collection/handling, submission of specimen other than nasopharyngeal swab, presence of viral mutation(s) within the areas targeted by this assay, and inadequate number of viral copies(<138 copies/mL). A negative result must be combined with clinical observations, patient history, and epidemiological information. The expected result is Negative.  Fact Sheet for Patients:  EntrepreneurPulse.com.au  Fact Sheet for Healthcare Providers:  IncredibleEmployment.be  This test is no t yet approved or cleared by the [place] FDA and  has been authorized for detection and/or diagnosis of SARS-CoV-2 by FDA under an Emergency Use Authorization (EUA). This EUA will remain  in effect (meaning this test can be used) for the duration of the COVID-19 declaration under Section 564(b)(1) of the Act, 21 U.S.C.section 360bbb-3(b)(1), unless the authorization is terminated  or revoked sooner.       Influenza A by PCR NEGATIVE NEGATIVE Final   Influenza B by PCR NEGATIVE NEGATIVE Final    Comment: (NOTE) The Xpert Xpress SARS-CoV-2/FLU/RSV plus assay is intended as an aid in the diagnosis of influenza from Nasopharyngeal swab specimens and should not be used as a sole basis for treatment. Nasal washings and aspirates are unacceptable for Xpert Xpress SARS-CoV-2/FLU/RSV testing.  Fact Sheet for Patients: EntrepreneurPulse.com.au  Fact Sheet for Healthcare  Providers: IncredibleEmployment.be  This test is not yet approved or cleared by the [place] FDA and has been authorized for detection and/or diagnosis of SARS-CoV-2 by FDA under an Emergency Use Authorization (EUA). This EUA will remain in effect (meaning this test can be used) for the duration of the COVID-19 declaration under Section 564(b)(1) of the Act, 21 U.S.C. section 360bbb-3(b)(1), unless the authorization is terminated or revoked.  Performed at [facility], [address], [place], [place] [address]    Time coordinating discharge: 35 minutes  SIGNED:  Kerney Elbe, [facility] 03/23/2021, 4:59 PM Pager is on [place]  If 7PM-7AM, please contact night-coverage [email or web address]

## 2021-03-23 NOTE — Care Management CC44 (Signed)
Condition Code 44 Documentation Completed  Patient Details  Name: Wanda [name] MRN: [id number] Date of Birth: [birth year]   Condition Code 44 given:  Yes Patient signature on Condition Code 44 notice:  Yes Documentation of 2 MD's agreement:  Yes Code 44 added to claim:  Yes    Natasha Bence, LCSW 03/23/2021, 4:29 PM

## 2021-03-23 NOTE — Consult Note (Signed)
Wanda [name], M.D. Gastroenterology & Hepatology                                           Patient Name: Wanda [name] Account #: '@FLAACCTNO' @   MRN: [id number] Admission Date: 03/22/2021 Date of Evaluation:  03/23/2021 Time of Evaluation: 10:28 AM   Referring Physician: Baird Kay, DO  Chief Complaint:  Melena  HPI:  This is a 87 y.o. female with history of recurrent iron deficiency anemia due to comedonal lesions with large hiatal hernia, OSA, hypertension and GERD, who came to the hospital after presenting recurrent episodes of melena since Thursday.  Patient reports that she was doing well until Thursday when she presented recurrent episodes of melena, reports large size of the stools which had tarry pasty appearance.  These episodes have persisted since then.  She denied having any abdominal pain, nausea, vomiting, fever, chills, hematochezia or any weight loss.  She denies taking any NSAIDs or any anticoagulants but she reports that she has been compliant with her Protonix 40 mg twice daily and has been taking iron twice a day.  She also takes Carafate twice a day as counseled by Dr. Gala Romney.  The patient was recently seen in the GI office by Dr. Gala Romney on 02/04/2021.  Unfortunately, the patient is not a surgical candidate for hernia repair.  She was advised to decrease her Carafate to twice a day and to continue the Protonix twice daily at that time.  Notably, the patient has had a capsule endoscopy in the past that did not show any alterations in her small bowel.  In the ED, the patient was hemodynamically stable and afebrile, although she had soft systolic blood pressure of 98/73.  Labs upon admission were remarkable for a hemoglobin of 10.9 which was close to her baseline but an increased BUN of 47 consistent with an upper gastrointestinal bleeding.  CMP was otherwise unremarkable.  Platelets were normal 311 and patient was positive for fecal occult blood test x2.  Last EGD on  12/26/2020, there was presence of a large hiatal hernia with Lysbeth Galas lesions.  There was a duodenal diverticulum.  Capsule was deployed at this time. Last colonoscopy 03/28/2012, internal hemorrhoids, diverticulosis, a single diminutive polyp was removed from the ascending colon.  Past Medical History: SEE CHRONIC ISSSUES: Past Medical History:  Diagnosis Date  . Anemia   . Cystocele   . Diverticulosis 10/20/10   Left-sided on colonoscopy by Dr. Gala Romney 10/20/10  . Gastric polyps 10/02/2013  . Gastritis 12/08/10   EGD by Dr. Altha Harm hiatal hernia, duodenal diverticulum, chronic gastritis  . GERD (gastroesophageal reflux disease)   . Heart murmur   . Hiatal hernia 10/02/2013  . HTN (hypertension)   . IDA (iron deficiency anemia)   . 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 Osteoarthritis   . Radial fracture    (right) Undergoing treatment by Dr. Aline Brochure currently  . Sleep apnea    STOP BANG SCORE 4  . Urge incontinence   . Vitamin D deficiency    Past Surgical History:  Past Surgical History:  Procedure Laterality Date  . ABDOMINAL HYSTERECTOMY    . ANTERIOR AND POSTERIOR REPAIR  05/17/2012   Procedure: ANTERIOR (CYSTOCELE) AND POSTERIOR REPAIR (RECTOCELE);  Surgeon: Jonnie Kind, MD;  Location: [facility];  Service: Gynecology;  Laterality: N/A;  . BIOPSY  11/22/2018   Procedure: BIOPSY;  Surgeon: Daneil Dolin, MD;  [facility];  Service: Endoscopy;;  . BIOPSY  02/12/2020   Procedure: BIOPSY;  Surgeon: Daneil Dolin, MD;  Location: [facility];  Service: Endoscopy;;  gastric biopsy  . CATARACT EXTRACTION, BILATERAL    . COLONOSCOPY  02/29/2012   Dr. Gala Romney: colonic diverticulosis, minimal internal hemorrhoids, benign polyp  . ESOPHAGOGASTRODUODENOSCOPY  02/29/2012   Dr. Gala Romney: atonic esophagus, moderate-sized hiatal hernia, fundal gland polyps  . ESOPHAGOGASTRODUODENOSCOPY N/A 10/02/2013   Dr. Gala Romney- multiple Wilhemena Durie gastric polyps- not manipulated   . ESOPHAGOGASTRODUODENOSCOPY N/A 01/14/2015   Dr. Gala Romney: Large hiatal hernia, otherwise normal EGD  . ESOPHAGOGASTRODUODENOSCOPY (EGD) WITH PROPOFOL N/A 11/22/2018   Dr. Gala Romney: normal esophagus, large hiatal hernia, erosive gastropathy s/p biopsy, normal duodenum. Reactive gastropathy  . ESOPHAGOGASTRODUODENOSCOPY (EGD) WITH PROPOFOL N/A 02/12/2020   normal esophagus, large hiatal hernia, [name] lesions, benign gastric biopsy  . ESOPHAGOGASTRODUODENOSCOPY (EGD) WITH PROPOFOL N/A 12/26/2020   Procedure: ESOPHAGOGASTRODUODENOSCOPY (EGD) WITH PROPOFOL;  Surgeon: Daneil Dolin, MD;  [facility];  Service: Endoscopy;  Laterality: N/A;  . GIVENS CAPSULE STUDY  02/29/2012   No explanation for IDA. Possible extrinsic compression but negative CT.   Marland Kitchen GIVENS CAPSULE STUDY N/A 12/26/2020   Procedure: GIVENS CAPSULE STUDY;  Surgeon: Daneil Dolin, MD;  [facility];  Service: Endoscopy;  Laterality: N/A;  . SALPINGOOPHORECTOMY  05/17/2012   Procedure: SALPINGO OOPHERECTOMY;  Surgeon: Jonnie Kind, MD;  Location: [facility];  Service: Gynecology;  Laterality: Bilateral;  . VAGINAL HYSTERECTOMY  05/17/2012   Procedure: HYSTERECTOMY VAGINAL;  Surgeon: Jonnie Kind, MD;  Location: [facility];  Service: Gynecology;  Laterality: N/A;   Family History:  Family History  Problem Relation Age of Onset  . Hypertension Mother   . Lymphoma Brother   . Anesthesia problems Neg Hx   . Hypotension Neg Hx   . Malignant hyperthermia Neg Hx   . Pseudochol deficiency Neg Hx   . Colon cancer Neg Hx    Social History:  Social History   Tobacco Use  . Smoking status: Never Smoker  . Smokeless tobacco: Never Used  . Tobacco comment: Never smoked  Vaping Use  . Vaping Use: Never used  Substance Use Topics  . Alcohol use: No    Alcohol/week: 0.0 standard drinks  . Drug use: No    Home Medications:  Prior to Admission medications   Medication Sig Start Date End Date Taking? Authorizing Provider   acetaminophen (TYLENOL) 325 MG tablet Take 2 tablets (650 mg total) by mouth every 6 (six) hours as needed for mild pain (or Fever >/= 101). 12/27/20  Yes Emokpae, Courage, MD  CALCIUM PO Take 300-600 mg by mouth.    Yes [provider]  Cholecalciferol (VITAMIN D3) 25 MCG (1000 UT) CAPS Take by mouth daily.   Yes [provider]  ciclopirox (PENLAC) 8 % solution Apply topically. 12/20/20  Yes [provider]  cyanocobalamin (,VITAMIN B-12,) 1000 MCG/ML injection Inject 1,000 mcg into the muscle every 30 (thirty) days. 01/10/21  Yes [provider]  ferrous sulfate (FEROSUL) 325 (65 FE) MG tablet Take 1 tablet (325 mg total) by mouth 2 (two) times daily with a meal. Take after breakfast and dinner 12/27/20  Yes Emokpae, Courage, MD  lisinopril-hydrochlorothiazide (ZESTORETIC) 20-12.5 MG tablet Take 1 tablet by mouth daily. 02/11/21  Yes [provider]  Multiple Vitamin (MULTIVITAMIN) tablet Take 1 tablet by mouth.    Yes  [provider]  pantoprazole (PROTONIX) 40 MG tablet Take 1 tablet (40 mg total) by mouth 2 (two) times daily. 12/27/20 12/27/21 Yes Emokpae, Courage, MD  sucralfate (CARAFATE) 1 g tablet Take 1 tablet (1 g total) by mouth 4 (four) times daily -  with meals and at bedtime. Patient taking differently: Take 1 g by mouth 2 (two) times daily. 12/27/20  Yes Emokpae, Courage, MD  ondansetron (ZOFRAN) 4 MG tablet Take 1 tablet (4 mg total) by mouth every 8 (eight) hours as needed for nausea or vomiting. Patient not taking: Reported on 03/22/2021 12/27/20   Roxan Hockey, MD    Inpatient Medications:  Current Facility-Administered Medications:  .  lactated ringers infusion, , Intravenous, Continuous, Truett Mainland, DO, Stopped at 03/23/21 0551 .  metoCLOPramide (REGLAN) injection 10 mg, 10 mg, Intravenous, Once, Montez Morita, Josimar Corning, MD .  ondansetron [facility]) tablet 4 mg, 4 mg, Oral, Q6H PRN **OR** ondansetron (ZOFRAN) injection 4  mg, 4 mg, Intravenous, Q6H PRN, Stinson, Jacob J, DO .  pantoprazole (PROTONIX) injection 40 mg, 40 mg, Intravenous, Q12H, Truett Mainland, DO, 40 mg at 03/23/21 1010 .  sodium chloride 0.9 % bolus 500 mL, 500 mL, Intravenous, Once, Montez Morita, Melika Reder, MD .  sucralfate (CARAFATE) tablet 1 g, 1 g, Oral, TID WC & HS, Truett Mainland, DO, 1 g at 03/23/21 1010 Allergies: Sulfa antibiotics and Naproxen sodium  Complete Review of Systems: GENERAL: negative for malaise, night sweats HEENT: No changes in hearing or vision, no nose bleeds or other nasal problems. NECK: Negative for lumps, goiter, pain and significant neck swelling RESPIRATORY: Negative for cough, wheezing CARDIOVASCULAR: Negative for chest pain, leg swelling, palpitations, orthopnea GI: SEE HPI MUSCULOSKELETAL: Negative for joint pain or swelling, back pain, and muscle pain. SKIN: Negative for lesions, rash PSYCH: Negative for sleep disturbance, mood disorder and recent psychosocial stressors. HEMATOLOGY Negative for prolonged bleeding, bruising easily, and swollen nodes. ENDOCRINE: Negative for cold or heat intolerance, polyuria, polydipsia and goiter. NEURO: negative for tremor, gait imbalance, syncope and seizures. The remainder of the review of systems is noncontributory.  Physical Exam: BP 95/65 (BP Location: Left Arm)   Pulse 82   Temp 97.9 F (36.6 C) (Oral)   Resp 16   Ht '4\' 9"'  (1.448 m)   Wt 49.6 kg   SpO2 98%   BMI 23.66 kg/m  GENERAL: The patient is AO x3, in no acute distress. HEENT: Head is normocephalic and atraumatic. EOMI are intact. Mouth is well hydrated and without lesions. NECK: Supple. No masses LUNGS: Clear to auscultation. No presence of rhonchi/wheezing/rales. Adequate chest expansion HEART: RRR, normal s1 and s2. ABDOMEN: Soft, nontender, no guarding, no peritoneal signs, and nondistended. BS +. No masses. EXTREMITIES: Without any cyanosis, clubbing, rash, lesions or edema. NEUROLOGIC:  AOx3, no focal motor deficit. SKIN: no jaundice, no rashes  Laboratory Data CBC:     Component Value Date/Time   WBC 9.1 03/23/2021 0600   RBC 3.29 (L) 03/23/2021 0600   HGB 10.1 (L) 03/23/2021 0600   HGB 12.4 02/21/2014 1328   HCT 32.1 (L) 03/23/2021 0600   HCT 37.2 02/21/2014 1328   PLT 291 03/23/2021 0600   PLT 273 02/21/2014 1328   MCV 97.6 03/23/2021 0600   MCV 93 02/21/2014 1328   MCH 30.7 03/23/2021 0600   MCHC 31.5 03/23/2021 0600   RDW 16.3 (H) 03/23/2021 0600   RDW 15.0 (H) 02/21/2014 1328   LYMPHSABS 1,972 01/07/2021 1428   LYMPHSABS 2.1 02/21/2014  1328   MONOABS 0.4 12/26/2020 0834   MONOABS 0.5 02/21/2014 1328   EOSABS 244 01/07/2021 1428   EOSABS 0.2 02/21/2014 1328   BASOSABS 46 01/07/2021 1428   BASOSABS 0.1 02/21/2014 1328   COAG: No results found for: INR, PROTIME  BMP:  BMP Latest Ref Rng & Units 03/22/2021 12/27/2020 12/26/2020  Glucose 70 - 99 mg/dL 103(H) 91 115(H)  BUN 8 - 23 mg/dL 47(H) 29(H) 49(H)  Creatinine 0.44 - 1.00 mg/dL 0.70 0.62 0.69  Sodium 135 - 145 mmol/L 138 141 138  Potassium 3.5 - 5.1 mmol/L 4.0 3.3(L) 4.1  Chloride 98 - 111 mmol/L 106 110 107  CO2 22 - 32 mmol/L '24 23 23  ' Calcium 8.9 - 10.3 mg/dL 8.7(L) 8.5(L) 8.8(L)    HEPATIC:  Hepatic Function Latest Ref Rng & Units 03/22/2021 12/26/2020 06/28/2020  Total Protein 6.5 - 8.1 g/dL 6.5 6.7 7.1  Albumin 3.5 - 5.0 g/dL 3.6 3.7 4.1  AST 15 - 41 U/L '24 27 23  ' ALT 0 - 44 U/L '22 23 19  ' Alk Phosphatase 38 - 126 U/L 73 76 81  Total Bilirubin 0.3 - 1.2 mg/dL 1.1 1.1 1.1  Bilirubin, Direct 0.0 - 0.3 mg/dL - - -    CARDIAC: No results found for: CKTOTAL, CKMB, CKMBINDEX, TROPONINI   Imaging: I personally reviewed and interpreted the available imaging.  Assessment & Plan: ELENIE COVEN is a 87 y.o. female with history of recurrent iron deficiency anemia due to comedonal lesions with large hiatal hernia, OSA, hypertension and GERD, who came to the hospital after presenting recurrent  episodes of melena since Thursday.  Patient has evidence of an upper gastrointestinal bleeding as her BUN is elevated but fortunately she has remained hemodynamically stable.  We will continue PPI IV twice daily, but will need to proceed with an EGD today emergently.  It is likely that she has presented recurrent bleeding from [facility] lesions but will evaluate other etiologies for this.  If this is coming from [place] lesions, will need to discuss potential surgical treatment of her hiatal hernia.  #Upper gastrointestinal bleeding #[name] lesions - Repeat CBC qday, transfuse if Hb <7 - Pantoprazole 40 mg q12h IVP - Start carafate 1 g QID - 2 large bore IV lines - Active T/S - Keep NPO - Avoid NSAIDs - Will proceed with EGD today - Will need to continue oral iron supplementation - May need to consider surgical management  Harvel Quale, MD Gastroenterology and Hepatology [facility]

## 2021-03-23 NOTE — Progress Notes (Incomplete)
PROGRESS NOTE    Wanda [name]  OVF:[id number] DOB: 1933-11-06 DOA: 03/22/2021 PCP: Renee Rival, NP   Brief Narrative: (Start on day 1 of progress note - keep it brief and live) ***   Assessment & Plan:   Principal Problem:   Upper GI bleed Active Problems:   HTN (hypertension)   Multiple gastric erosions   ***   DVT prophylaxis: (Lovenox/Heparin/SCD's/anticoagulated/None (if comfort care) Code Status: FULL CODE Family Communication: (Specify name, relationship & date discussed. NO "discussed with patient") Disposition Plan: (specify when and where you expect patient to be discharged). Include barriers to DC in this tab.  Status is: Observation  {Observation:23811}  Dispo: The patient is from: {From:23814}              Anticipated d/c is to: {To:23815}              Patient currently {Medically stable:23817}   Difficult to place patient {Yes/No:25151}  Consultants:   Gastroenterology  Procedures: (Don't include imaging studies which can be auto populated. Include things that cannot be auto populated i.e. Echo, Carotid and venous dopplers, Foley, Bipap, HD, tubes/drains, wound vac, central lines etc)  ***  Antimicrobials: (specify start and planned stop date. Auto populated tables are space occupying and do not give end dates) Anti-infectives (From admission, onward)   None        Subjective: ***  Objective: Vitals:   03/22/21 1957 03/22/21 2012 03/23/21 0019 03/23/21 0535  BP: (!) 112/53 94/62 (!) 86/34 95/65  Pulse: 88 77 71 82  Resp: 16 16 16 16   Temp: 98.5 F (36.9 C) 98.7 F (37.1 C) 97.6 F (36.4 C) 97.9 F (36.6 C)  TempSrc: Oral Oral Oral Oral  SpO2: 99% 99% 100% 98%  Weight:  49.6 kg    Height:  4\' 9"  (1.448 m)      Intake/Output Summary (Last 24 hours) at 03/23/2021 0820 Last data filed at 03/23/2021 0725 Gross per 24 hour  Intake 536.94 ml  Output -  Net 536.94 ml   Filed Weights   03/22/21 1347 03/22/21 2012  Weight: 52.2  kg 49.6 kg    Examination: Physical Exam:  Constitutional: WN/WD, NAD and appears calm and comfortable Eyes: PERRL, lids and conjunctivae normal, sclerae anicteric  ENMT: External Ears, Nose appear normal. Grossly normal hearing. Mucous membranes are moist. Posterior pharynx clear of any exudate or lesions. Normal dentition.  Neck: Appears normal, supple, no cervical masses, normal ROM, no appreciable thyromegaly Respiratory: Clear to auscultation bilaterally, no wheezing, rales, rhonchi or crackles. Normal respiratory effort and patient is not tachypenic. No accessory muscle use.  Cardiovascular: RRR, no murmurs / rubs / gallops. S1 and S2 auscultated. No extremity edema. 2+ pedal pulses. No carotid bruits.  Abdomen: Soft, non-tender, non-distended. No masses palpated. No appreciable hepatosplenomegaly. Bowel sounds positive.  GU: Deferred. Musculoskeletal: No clubbing / cyanosis of digits/nails. No joint deformity upper and lower extremities. Good ROM, no contractures. Normal strength and muscle tone.  Skin: No rashes, lesions, ulcers. No induration; Warm and dry.  Neurologic: CN 2-12 grossly intact with no focal deficits. Sensation intact in all 4 Extremities, DTR normal. Strength 5/5 in all 4. Romberg sign cerebellar reflexes not assessed.  Psychiatric: Normal judgment and insight. Alert and oriented x 3. Normal mood and appropriate affect.   Data Reviewed: I have personally reviewed following labs and imaging studies  CBC: Recent Labs  Lab 03/22/21 1408 03/22/21 1856 03/23/21 0059 03/23/21 0600  WBC 11.6* 10.6* 9.1  9.1  HGB 10.9* 10.2* 9.1* 10.1*  HCT 34.2* 32.1* 28.5* 32.1*  MCV 97.2 96.7 96.9 97.6  PLT 311 264 247 322   Basic Metabolic Panel: Recent Labs  Lab 03/22/21 1408  NA 138  K 4.0  CL 106  CO2 24  GLUCOSE 103*  BUN 47*  CREATININE 0.70  CALCIUM 8.7*   GFR: Estimated Creatinine Clearance: 34.9 mL/min (by C-G formula based on SCr of 0.7 mg/dL). Liver  Function Tests: Recent Labs  Lab 03/22/21 1408  AST 24  ALT 22  ALKPHOS 73  BILITOT 1.1  PROT 6.5  ALBUMIN 3.6   No results for input(s): LIPASE, AMYLASE in the last 168 hours. No results for input(s): AMMONIA in the last 168 hours. Coagulation Profile: No results for input(s): INR, PROTIME in the last 168 hours. Cardiac Enzymes: No results for input(s): CKTOTAL, CKMB, CKMBINDEX, TROPONINI in the last 168 hours. BNP (last 3 results) No results for input(s): PROBNP in the last 8760 hours. HbA1C: No results for input(s): HGBA1C in the last 72 hours. CBG: No results for input(s): GLUCAP in the last 168 hours. Lipid Profile: No results for input(s): CHOL, HDL, LDLCALC, TRIG, CHOLHDL, LDLDIRECT in the last 72 hours. Thyroid Function Tests: No results for input(s): TSH, T4TOTAL, FREET4, T3FREE, THYROIDAB in the last 72 hours. Anemia Panel: No results for input(s): VITAMINB12, FOLATE, FERRITIN, TIBC, IRON, RETICCTPCT in the last 72 hours. Sepsis Labs: No results for input(s): PROCALCITON, LATICACIDVEN in the last 168 hours.  Recent Results (from the past 240 hour(s))  Resp Panel by RT-PCR (Flu A&B, Covid) Nasopharyngeal Swab     Status: None   Collection Time: 03/22/21  4:08 PM   Specimen: Nasopharyngeal Swab; Nasopharyngeal(NP) swabs in vial transport medium  Result Value Ref Range Status   SARS Coronavirus 2 by RT PCR NEGATIVE NEGATIVE Final    Comment: (NOTE) SARS-CoV-2 target nucleic acids are NOT DETECTED.  The SARS-CoV-2 RNA is generally detectable in upper respiratory specimens during the acute phase of infection. The lowest concentration of SARS-CoV-2 viral copies this assay can detect is 138 copies/mL. A negative result does not preclude SARS-Cov-2 infection and should not be used as the sole basis for treatment or other patient management decisions. A negative result may occur with  improper specimen collection/handling, submission of specimen other than  nasopharyngeal swab, presence of viral mutation(s) within the areas targeted by this assay, and inadequate number of viral copies(<138 copies/mL). A negative result must be combined with clinical observations, patient history, and epidemiological information. The expected result is Negative.  Fact Sheet for Patients:  EntrepreneurPulse.com.au  Fact Sheet for Healthcare Providers:  IncredibleEmployment.be  This test is no t yet approved or cleared by the [place] FDA and  has been authorized for detection and/or diagnosis of SARS-CoV-2 by FDA under an Emergency Use Authorization (EUA). This EUA will remain  in effect (meaning this test can be used) for the duration of the COVID-19 declaration under Section 564(b)(1) of the Act, 21 U.S.C.section 360bbb-3(b)(1), unless the authorization is terminated  or revoked sooner.       Influenza A by PCR NEGATIVE NEGATIVE Final   Influenza B by PCR NEGATIVE NEGATIVE Final    Comment: (NOTE) The Xpert Xpress SARS-CoV-2/FLU/RSV plus assay is intended as an aid in the diagnosis of influenza from Nasopharyngeal swab specimens and should not be used as a sole basis for treatment. Nasal washings and aspirates are unacceptable for Xpert Xpress SARS-CoV-2/FLU/RSV testing.  Fact Sheet for Patients: EntrepreneurPulse.com.au  Fact Sheet for Healthcare Providers: IncredibleEmployment.be  This test is not yet approved or cleared by the [place] FDA and has been authorized for detection and/or diagnosis of SARS-CoV-2 by FDA under an Emergency Use Authorization (EUA). This EUA will remain in effect (meaning this test can be used) for the duration of the COVID-19 declaration under Section 564(b)(1) of the Act, 21 U.S.C. section 360bbb-3(b)(1), unless the authorization is terminated or revoked.  Performed at [facility], [address], [place], [place] [address]     RN  Pressure Injury Documentation:     Estimated body mass index is 23.66 kg/m as calculated from the following:   Height as of this encounter: 4\' 9"  (1.448 m).   Weight as of this encounter: 49.6 kg.  Malnutrition Type:   Malnutrition Characteristics:   Nutrition Interventions:     Radiology Studies: No results found.  Scheduled Meds: . pantoprazole  40 mg Intravenous Q12H  . sucralfate  1 g Oral TID WC & HS   Continuous Infusions: . lactated ringers Stopped (03/23/21 0551)    LOS: 0 days   Kerney Elbe, DO [facility] PAGER is on [place]  If 7PM-7AM, please contact night-coverage [email or web address]

## 2021-03-23 NOTE — Brief Op Note (Signed)
03/22/2021 - 03/23/2021  12:19 PM  PATIENT:  Wanda [name]  87 y.o. female  PRE-OPERATIVE DIAGNOSIS:  melena  POST-OPERATIVE DIAGNOSIS:  Lysbeth Galas erosions;severe, hiatal,hernia;  PROCEDURE:  Procedure(s): ESOPHAGOGASTRODUODENOSCOPY (EGD) WITH PROPOFOL (N/A)  SURGEON:  Surgeon(s) and Role:    * Harvel Quale, MD - Primary  Patient underwent EGD under propofol sedation.  The patient tolerated procedure adequately.  She was found to have a large hiatal hernia with involvement of at least 50% of the gastric chamber.  There was presence of multiple [name] ulcers which were not bleeding but there was present with stigmata of bleeding as there was evidence of coffee-ground contents attached to some of them.  The rest of the stomach looked normal otherwise.  There was no alteration in the duodenum.  RECOMMENDATIONS: - Return patient to hospital ward for ongoing care.  - Resume previous diet.  - Use Prilosec (omeprazole) 40 mg PO BID.  - Use sucralfate suspension 1 gram PO QID.  - May consider surgical repair of hiatal hernia. - Patient will follow up in GI clinic in 3-4 weeks. - GI service will sign-off, please call us back if you have any more questions.  [name], MD Gastroenterology and Hepatology [facility]

## 2021-03-23 NOTE — Op Note (Signed)
[facility] Patient Name: Wanda [name] Procedure Date: 03/23/2021 11:33 AM MRN: [id number] Date of Birth: 05/28/1933 Attending MD: [name] ,  CSN: [id number] Age: 87 Admit Type: Outpatient Procedure:                Upper GI endoscopy Indications:              Melena Providers:                [name], Lurline Del, RN, Casimer Bilis, Technician Referring MD:              Medicines:                Monitored Anesthesia Care Complications:            No immediate complications. Estimated Blood Loss:     Estimated blood loss: none. Procedure:                Pre-Anesthesia Assessment:                           - Prior to the procedure, a History and Physical                            was performed, and patient medications, allergies                            and sensitivities were reviewed. The patient's                            tolerance of previous anesthesia was reviewed.                           - The risks and benefits of the procedure and the                            sedation options and risks were discussed with the                            patient. All questions were answered and informed                            consent was obtained.                           - ASA Grade Assessment: III - A patient with severe                            systemic disease.                           After obtaining informed consent, the endoscope was                            passed under direct vision. Throughout the  procedure, the patient's blood pressure, pulse, and                            oxygen saturations were monitored continuously. The                            [id number]-[id number] ([id number]) was introduced through the                            mouth, and advanced to the second part of duodenum.                            The upper GI endoscopy was accomplished without                             difficulty. The patient tolerated the procedure                            well. Scope In: 12:01:05 PM Scope Out: 12:05:37 PM Total Procedure Duration: 0 hours 4 minutes 32 seconds  Findings:      The examined esophagus was normal.      A large hiatal hernia involving 50% of the gastric chamber with multiple       [name] ulcers was found. None of the [name] ulcers were bleeding but       they had presence of stigmata of recent bleeding as there was presence       of some coofee ground contents.      A deformity was found in the gastric body due to the severity of the       haital hernia.      The examined duodenum was normal. Impression:               - Normal esophagus.                           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                           - Deformity in the gastric body.                           - Normal examined duodenum.                           - No specimens collected. Moderate Sedation:      Per Anesthesia Care Recommendation:           - Return patient to hospital ward for ongoing care.                           - Resume previous diet.                           - Use Prilosec (omeprazole) 40 mg PO BID.                           -  Use sucralfate suspension 1 gram PO QID.                           - May consider surgical repair of hiatal hernia. Procedure Code(s):        --- Professional ---                           [phone], Esophagogastroduodenoscopy, flexible,                            transoral; diagnostic, including collection of                            specimen(s) by brushing or washing, when performed                            (separate procedure) Diagnosis Code(s):        --- Professional ---                           K44.9, Diaphragmatic hernia without obstruction or                            gangrene                           K25.9, Gastric ulcer, unspecified as acute or                            chronic, without hemorrhage  or perforation                           K31.89, Other diseases of stomach and duodenum                           K92.1, Melena (includes Hematochezia) CPT copyright 2019 American Medical Association. All rights reserved. The codes documented in this report are preliminary and upon coder review may  be revised to meet current compliance requirements. [name], MD [name],  03/23/2021 12:19:24 PM This report has been signed electronically. Number of Addenda: 0

## 2021-03-23 NOTE — Transfer of Care (Signed)
Immediate Anesthesia Transfer of Care Note  Patient: Wanda [name]  Procedure(s) Performed: ESOPHAGOGASTRODUODENOSCOPY (EGD) WITH PROPOFOL (N/A )  Patient Location: PACU  Anesthesia Type:General  Level of Consciousness: awake, alert , oriented and sedated  Airway & Oxygen Therapy: Patient Spontanous Breathing and Patient connected to nasal cannula oxygen  Post-op Assessment: Report given to RN and Post -op Vital signs reviewed and stable  Post vital signs: Reviewed and stable  Last Vitals:  Vitals Value Taken Time  BP 91/50 03/23/21 1220  Temp 36.6 C 03/23/21 1218  Pulse 69 03/23/21 1224  Resp 18 03/23/21 1224  SpO2 98 % 03/23/21 1224  Vitals shown include unvalidated device data.  Last Pain:  Vitals:   03/23/21 1218  TempSrc:   PainSc: 0-No pain         Complications: No complications documented.

## 2021-03-28 ENCOUNTER — Encounter (HOSPITAL_COMMUNITY): Payer: Self-pay | Admitting: Gastroenterology

## 2021-04-18 ENCOUNTER — Other Ambulatory Visit: Payer: Self-pay | Admitting: *Deleted

## 2021-04-18 ENCOUNTER — Encounter: Payer: Self-pay | Admitting: Internal Medicine

## 2021-04-18 ENCOUNTER — Ambulatory Visit (INDEPENDENT_AMBULATORY_CARE_PROVIDER_SITE_OTHER): Payer: Medicare Other | Admitting: Internal Medicine

## 2021-04-18 VITALS — BP 120/66 | HR 69 | Temp 96.9°F | Ht <= 58 in | Wt 111.8 lb

## 2021-04-18 DIAGNOSIS — K922 Gastrointestinal hemorrhage, unspecified: Secondary | ICD-10-CM | POA: Diagnosis not present

## 2021-04-18 DIAGNOSIS — D649 Anemia, unspecified: Secondary | ICD-10-CM

## 2021-04-18 DIAGNOSIS — D509 Iron deficiency anemia, unspecified: Secondary | ICD-10-CM

## 2021-04-18 MED ORDER — SUCRALFATE 1 GM/10ML PO SUSP: 1.0000 g | Freq: Four times a day (QID) | ORAL | 11 refills | Status: DC

## 2021-04-18 NOTE — Progress Notes (Signed)
Primary Care Physician:  Renee Rival, NP Primary Gastroenterologist:  Dr. Gala Romney  Pre-Procedure History & Physical: HPI:  Wanda [name] is a 87 y.o. female here for follow-up of recurrent upper GI bleeding.  Patient has stuttering recurrent upper GI bleeding which she has been extensively evaluated over the years  She has had 9 EGDs in the past 8 years; plus one a month ago for melena and a decline in hemoglobin/fatigue.  Repeat EGD again demonstrated a very large hiatal hernia and multiple [name] erosions.  Given prior colonoscopy, capsule study the small bowel and multiple upper Endo's, has been reaffirmed patient has had stuttering bleeding from her upper GI tract (i.e. [name] lesions).  During her last hospitalization, it was recommended that she seek out surgical consultation for potential hiatal hernia repair.  She is hesitant to do this.  This has been discussed at length with the patient previously;  given her age and comorbidities it was hoped that her condition can be managed medically.  Her last hemoglobin is 9.9 on 5/8 without any follow-up assessment.  She has not had any melena, rectal bleeding.  In fact, really does not have any upper GI tract symptoms such as early satiety reflux, odynophagia or dysphagia.  Her current regimen includes Protonix 40 mg twice daily and Carafate 1g slurry before meals and at bedtime.  Past Medical History:  Diagnosis Date  . Anemia   . Cystocele   . Diverticulosis 10/20/10   Left-sided on colonoscopy by Dr. Gala Romney 10/20/10  . Gastric polyps 10/02/2013  . Gastritis 12/08/10   EGD by Dr. Altha Harm hiatal hernia, duodenal diverticulum, chronic gastritis  . GERD (gastroesophageal reflux disease)   . Heart murmur   . Hiatal hernia 10/02/2013  . HTN (hypertension)   . IDA (iron deficiency anemia)   . 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 Osteoarthritis   . Radial fracture    (right) Undergoing treatment by Dr. Aline Brochure currently   . Sleep apnea    STOP BANG SCORE 4  . Urge incontinence   . Vitamin D deficiency     Past Surgical History:  Procedure Laterality Date  . ABDOMINAL HYSTERECTOMY    . ANTERIOR AND POSTERIOR REPAIR  05/17/2012   Procedure: ANTERIOR (CYSTOCELE) AND POSTERIOR REPAIR (RECTOCELE);  Surgeon: Jonnie Kind, MD;  Location: [facility];  Service: Gynecology;  Laterality: N/A;  . BIOPSY  11/22/2018   Procedure: BIOPSY;  Surgeon: Daneil Dolin, MD;  [facility];  Service: Endoscopy;;  . BIOPSY  02/12/2020   Procedure: BIOPSY;  Surgeon: Daneil Dolin, MD;  Location: [facility];  Service: Endoscopy;;  gastric biopsy  . CATARACT EXTRACTION, BILATERAL    . COLONOSCOPY  02/29/2012   Dr. Gala Romney: colonic diverticulosis, minimal internal hemorrhoids, benign polyp  . ESOPHAGOGASTRODUODENOSCOPY  02/29/2012   Dr. Gala Romney: atonic esophagus, moderate-sized hiatal hernia, fundal gland polyps  . ESOPHAGOGASTRODUODENOSCOPY N/A 10/02/2013   Dr. Gala Romney- multiple Wilhemena Durie gastric polyps- not manipulated  . ESOPHAGOGASTRODUODENOSCOPY N/A 01/14/2015   Dr. Gala Romney: Large hiatal hernia, otherwise normal EGD  . ESOPHAGOGASTRODUODENOSCOPY (EGD) WITH PROPOFOL N/A 11/22/2018   Dr. Gala Romney: normal esophagus, large hiatal hernia, erosive gastropathy s/p biopsy, normal duodenum. Reactive gastropathy  . ESOPHAGOGASTRODUODENOSCOPY (EGD) WITH PROPOFOL N/A 02/12/2020   normal esophagus, large hiatal hernia, [name] lesions, benign gastric biopsy  . ESOPHAGOGASTRODUODENOSCOPY (EGD) WITH PROPOFOL N/A 12/26/2020   Procedure: ESOPHAGOGASTRODUODENOSCOPY (EGD) WITH PROPOFOL;  Surgeon: Daneil Dolin, MD;  [facility];  Service: Endoscopy;  Laterality: N/A;  . ESOPHAGOGASTRODUODENOSCOPY (EGD) WITH PROPOFOL N/A 03/23/2021   Procedure: ESOPHAGOGASTRODUODENOSCOPY (EGD) WITH PROPOFOL;  Surgeon: Harvel Quale, MD;  [facility];  Service: Gastroenterology;  Laterality: N/A;  . GIVENS CAPSULE STUDY   02/29/2012   No explanation for IDA. Possible extrinsic compression but negative CT.   Marland Kitchen GIVENS CAPSULE STUDY N/A 12/26/2020   Procedure: GIVENS CAPSULE STUDY;  Surgeon: Daneil Dolin, MD;  [facility];  Service: Endoscopy;  Laterality: N/A;  . SALPINGOOPHORECTOMY  05/17/2012   Procedure: SALPINGO OOPHERECTOMY;  Surgeon: Jonnie Kind, MD;  Location: [facility];  Service: Gynecology;  Laterality: Bilateral;  . VAGINAL HYSTERECTOMY  05/17/2012   Procedure: HYSTERECTOMY VAGINAL;  Surgeon: Jonnie Kind, MD;  Location: [facility];  Service: Gynecology;  Laterality: N/A;    Prior to Admission medications   Medication Sig Start Date End Date Taking? Authorizing Provider  acetaminophen (TYLENOL) 325 MG tablet Take 2 tablets (650 mg total) by mouth every 6 (six) hours as needed for mild pain (or Fever >/= 101). 12/27/20  Yes Emokpae, Courage, MD  BIOTIN PO Take by mouth daily.   Yes [provider]  CALCIUM PO Take 300-600 mg by mouth daily.   Yes [provider]  Cholecalciferol (VITAMIN D3) 25 MCG (1000 UT) CAPS Take by mouth daily.   Yes [provider]  ciclopirox (PENLAC) 8 % solution Apply topically. 12/20/20  Yes [provider]  cyanocobalamin (,VITAMIN B-12,) 1000 MCG/ML injection Inject 1,000 mcg into the muscle every 30 (thirty) days. 01/10/21  Yes [provider]  ferrous sulfate (FEROSUL) 325 (65 FE) MG tablet Take 1 tablet (325 mg total) by mouth 2 (two) times daily with a meal. Take after breakfast and dinner 12/27/20  Yes Emokpae, Courage, MD  lisinopril-hydrochlorothiazide (ZESTORETIC) 20-12.5 MG tablet Take 1 tablet by mouth daily. 02/11/21  Yes [provider]  Multiple Vitamin (MULTIVITAMIN) tablet Take 1 tablet by mouth.    Yes [provider]  omeprazole (PRILOSEC OTC) 20 MG tablet Take 2 tablets (40 mg total) by mouth in the morning and at bedtime. 03/23/21 04/22/21 Yes Sheikh, Omair Latif, DO  sucralfate (CARAFATE) 1  GM/10ML suspension Take 10 mLs (1 g total) by mouth 4 (four) times daily -  with meals and at bedtime. 03/23/21  Yes Sheikh, Omair [place], DO    Allergies as of 04/18/2021 - Review Complete 04/18/2021  Allergen Reaction Noted  . Sulfa antibiotics  06/23/2016  . Naproxen sodium Rash and Other (See Comments)     Family History  Problem Relation Age of Onset  . Hypertension Mother   . Lymphoma Brother   . Anesthesia problems Neg Hx   . Hypotension Neg Hx   . Malignant hyperthermia Neg Hx   . Pseudochol deficiency Neg Hx   . Colon cancer Neg Hx     Social History   Socioeconomic History  . Marital status: Widowed    Spouse name: Not on file  . Number of children: Not on file  . Years of education: Not on file  . Highest education level: Not on file  Occupational History  . Occupation: Retired    Fish farm manager: RETIRED  Tobacco Use  . Smoking status: Never Smoker  . Smokeless tobacco: Never Used  . Tobacco comment: Never smoked  Vaping Use  . Vaping Use: Never used  Substance and Sexual Activity  . Alcohol use: No    Alcohol/week: 0.0 standard drinks  .  Drug use: No  . Sexual activity: Not Currently    Birth control/protection: Surgical  Other Topics Concern  . Not on file  Social History Narrative  . Not on file   Social Determinants of Health   Financial Resource Strain: Not on file  Food Insecurity: Not on file  Transportation Needs: Not on file  Physical Activity: Not on file  Stress: Not on file  Social Connections: Not on file  Intimate Partner Violence: Not on file    Review of Systems: See HPI, otherwise negative ROS  Physical Exam: BP 120/66   Pulse 69   Temp (!) 96.9 F (36.1 C) (Temporal)   Ht 4\' 9"  (1.448 m)   Wt 111 lb 12.8 oz (50.7 kg)   BMI 24.19 kg/m  General: Elderly, somewhat frail but mentally sharp lady pleasant and cooperative in NAD; accompanied by her son. Neck:  Supple; no masses or thyromegaly. No significant cervical  adenopathy. Lungs:  Clear throughout to auscultation.   No wheezes, crackles, or rhonchi. No acute distress. Heart:  Regular rate and rhythm; no murmurs, clicks, rubs,  or gallops. Abdomen: Non-distended, normal bowel sounds.  Soft and nontender without appreciable mass or hepatosplenomegaly.  Pulses:  Normal pulses noted. Extremities:  Without clubbing or edema.  Impression/Plan: 87 year old lady with a transient clinical scenario of upper GI bleeding secondary to [facility] lesions requiring multiple hospitalizations.  She is undergone multiple endoscopic procedures over the years.   I see no endpoint insight.  I told the patient and her son that she would likely have recurrent episodes.  She has had multiple endoscopic procedures and, thus far, has where the weathered them well.  Given the frequent, recurrent nature of her symptoms, I told her today that I feel it would be worthwhile, in her  particular case, to go see a laparoscopic surgeon  - have a sitdown discussion about the pros and cons of surgical repair of her hernia.  She is interested in getting more information.  Recommendations:  Continue Protonix 40 mg twice daily  Continue Carafate/sucralfate 1 g slurry before meals and at bedtime (dispense 120 doses with 11 refills)  GERD information provided  As discussed, will arrange referral for consultation with Dr. Demetrio Lapping, GI surgeon at [facility] in the near future  CBC next week  Office visit with [place] in 4 months    Notice: This dictation was prepared with Dragon dictation along with smaller phrase technology. Any transcriptional errors that result from this process are unintentional and may not be corrected upon review.

## 2021-04-18 NOTE — Patient Instructions (Signed)
Continue Protonix 40 mg twice daily  Continue Carafate/sucralfate 1 g slurry before meals and at bedtime (dispense 120 doses with 11 refills)  GERD information provided  As discussed, will arrange referral for consultation with Dr. Demetrio Lapping, GI surgeon at [facility] in the near future  CBC next week  Office visit with [place] in 4 months

## 2021-04-21 ENCOUNTER — Other Ambulatory Visit (HOSPITAL_COMMUNITY): Payer: Self-pay

## 2021-04-21 ENCOUNTER — Other Ambulatory Visit: Payer: Self-pay

## 2021-04-21 DIAGNOSIS — D500 Iron deficiency anemia secondary to blood loss (chronic): Secondary | ICD-10-CM

## 2021-04-21 DIAGNOSIS — E538 Deficiency of other specified B group vitamins: Secondary | ICD-10-CM

## 2021-04-21 DIAGNOSIS — K449 Diaphragmatic hernia without obstruction or gangrene: Secondary | ICD-10-CM

## 2021-04-22 ENCOUNTER — Other Ambulatory Visit (HOSPITAL_COMMUNITY): Payer: Self-pay

## 2021-04-22 DIAGNOSIS — E538 Deficiency of other specified B group vitamins: Secondary | ICD-10-CM

## 2021-04-22 DIAGNOSIS — D500 Iron deficiency anemia secondary to blood loss (chronic): Secondary | ICD-10-CM

## 2021-04-24 NOTE — Progress Notes (Signed)
Phoned the pt and LMOVM for the pt regarding her bloodwork for Dr. Gala Romney needing to be done this week

## 2021-04-28 ENCOUNTER — Other Ambulatory Visit: Payer: Self-pay | Admitting: *Deleted

## 2021-04-29 ENCOUNTER — Encounter (HOSPITAL_COMMUNITY): Payer: Self-pay

## 2021-04-29 NOTE — Progress Notes (Signed)
Phoned and spoke with the pt who stated she had blood work done on the 6th and she thought we had it, but she will call her PCP's office and have them send it to [place]

## 2021-04-30 ENCOUNTER — Telehealth: Payer: Self-pay

## 2021-04-30 NOTE — Telephone Encounter (Signed)
Dr.Rourk,  This pt sent you lab results that she had done via mychart. I have printed them and placed them on your cart. Her previous Hgb on 03/23/21 was 9.9, her Hgb on this blood work, dated 04/22/21 is 10.8.

## 2021-05-05 ENCOUNTER — Encounter: Payer: Self-pay | Admitting: Internal Medicine

## 2021-05-06 ENCOUNTER — Telehealth: Payer: Self-pay | Admitting: Internal Medicine

## 2021-05-06 NOTE — Telephone Encounter (Signed)
See other phone note

## 2021-05-06 NOTE — Telephone Encounter (Signed)
Spoke to pt.  She was made aware of results and recommendations.  Pt said that she cancelled her referral to Dr. Abran Richard at [facility].  She said that she really didn't want to go to a teaching hospital.  She said that her and her family discussed it and she wants a referral to a Psychologist, sport and exercise in [place].  Informed her that I would let Dr. Gala Romney know.  Routing to Dr. Gala Romney and [facility] Clinical Pool.

## 2021-05-06 NOTE — Telephone Encounter (Signed)
Paper copy of labs reviewed.  She has a mixed anemia.  History of B12 deficiency.  Not clearly iron deficient.  Continue to recommend surgical consultation for repair of large hiatal hernia.

## 2021-05-06 NOTE — Telephone Encounter (Signed)
Patient son called and said that he would like his mother to go to [place] surgery for her hernia.  Dr. Gala Romney had mentioned ha few choices to her.  They want her to see whomever there that Dr. Gala Romney would suggest  [phone]

## 2021-05-06 NOTE — Telephone Encounter (Signed)
Referral sent to [facility] surgery via [employer]. Called and informed pt referral was sent.

## 2021-05-08 ENCOUNTER — Other Ambulatory Visit (HOSPITAL_COMMUNITY): Payer: Medicare Other

## 2021-05-09 ENCOUNTER — Other Ambulatory Visit: Payer: Self-pay | Admitting: Gastroenterology

## 2021-05-09 DIAGNOSIS — K921 Melena: Secondary | ICD-10-CM

## 2021-05-09 DIAGNOSIS — K922 Gastrointestinal hemorrhage, unspecified: Secondary | ICD-10-CM

## 2021-05-09 DIAGNOSIS — D509 Iron deficiency anemia, unspecified: Secondary | ICD-10-CM

## 2021-05-14 NOTE — Progress Notes (Addendum)
Virtual Visit via Telephone Note Operating Room Services  I connected with Wanda [name] on 05/15/21  at 3:36 PM by telephone and verified that I am speaking with the correct person using two identifiers.  Location: Patient: Home Provider: [facility]   I discussed the limitations, risks, security and privacy concerns of performing an evaluation and management service by telephone and the availability of in person appointments. I also discussed with the patient that there may be a patient responsible charge related to this service. The patient expressed understanding and agreed to proceed.   History of Present Illness: [name] is contacted today for follow-up of her iron deficiency anemia (secondary to chronic intermittent GI bleeding) and B12 deficiency.  She was last seen by Tarri Abernethy PA-C on 02/05/2021.  Last iron infusion was with Feraheme on 02/07/2021 and 02/14/2021.  Patient was hospitalized from 03/22/2021 through 03/23/2021 due to an episode of melena.  EGD performed on 03/23/2021 showed multiple Wanda [name] lesions in the gastric chamber with stigmata of recent bleeding including coffee-ground contents.  At today's visit, the patient reports feeling fair. Her biggest concern is her fatigue - she reports that she has very little energy.  She has not had any more episodes of melena since she was hospitalized in May.  She continues to take daily iron supplement at home and has some dark stools as result of this but states that she can "tell the difference" from when the dark stools are from her iron versus when they are due to her GI bleeding.  No chest pain, dyspnea on exertion, syncope, or palpitations.  She continues to get B12 injections on a monthly basis from her PCP.  She reports that previous IV iron infusions did not do much to increase her energy.    Observations/Objective: Review of Systems  Constitutional:  Positive for malaise/fatigue. Negative  for chills, diaphoresis, fever and weight loss.  Respiratory:  Negative for cough and shortness of breath.   Cardiovascular:  Negative for chest pain and palpitations.  Gastrointestinal:  Negative for abdominal pain, blood in stool, melena, nausea and vomiting.  Neurological:  Negative for dizziness and headaches.    PHYSICAL EXAM (per limitations of virtual telephone visit): The patient is alert and oriented x 3, exhibiting adequate mentation, good mood, and ability to speak in full sentences and execute sound judgement.   ASSESSMENT & PLAN: 1.  Iron deficiency anemia secondary to chronic GI blood loss - Patient has had intermittent GI bleeding over the past several years, about once per year.  Has had multiple units of PRBCs, as well as intermittent IV iron. - Patient had EGD in 2016 by Dr. Gala Romney with a large hiatal hernia, otherwise normal.  Previously noted multiple [name] lesions in 2014.  Capsule study in 2013 without explanation for IDA.  Last colonoscopy in 2013 with colonic diverticulosis. - Patient was hospitalized from 03/22/2021 through 03/23/2021 due to an episode of melena.  EGD performed on 03/23/2021 showed multiple Wanda [name] lesions in the gastric chamber with stigmata of recent bleeding including coffee-ground contents. - Last iron infusion was with Feraheme on 02/07/2021 and 02/14/2021. - Most recent labs (obtained 04/22/2021 at [facility] laboratory and [place]): Hgb 10.8 with MCV 97.4, ferritin 318, TIBC 251, iron saturation 19% - PLAN: Continue oral iron.  No indication for IV iron at this time.  Due to extreme fatigue and recent blood loss, we will repeat iron panel and CBC in 1 month with follow-up visit  at that time.   2.  Vitamin B12 deficiency: - Most recent B12 was 430 (04/22/2021) - PLAN: Continue monthly B12 injections from her primary care provider     Follow Up Instructions: -Labs in 4 weeks - Phone visit after labs    I discussed the assessment and treatment plan with  the patient. The patient was provided an opportunity to ask questions and all were answered. The patient agreed with the plan and demonstrated an understanding of the instructions.   The patient was advised to call back or seek an in-person evaluation if the symptoms worsen or if the condition fails to improve as anticipated.  I provided 13 minutes of non-face-to-face time during this encounter.   Harriett Rush, PA-C 05/15/21 4:16 PM

## 2021-05-15 ENCOUNTER — Inpatient Hospital Stay (HOSPITAL_COMMUNITY): Payer: Medicare Other | Attending: Hematology and Oncology | Admitting: Physician Assistant

## 2021-05-15 ENCOUNTER — Encounter (HOSPITAL_COMMUNITY): Payer: Self-pay | Admitting: Physician Assistant

## 2021-05-15 ENCOUNTER — Other Ambulatory Visit: Payer: Self-pay

## 2021-05-15 DIAGNOSIS — D500 Iron deficiency anemia secondary to blood loss (chronic): Secondary | ICD-10-CM | POA: Diagnosis not present

## 2021-05-21 ENCOUNTER — Telehealth: Payer: Self-pay | Admitting: Internal Medicine

## 2021-05-21 NOTE — Telephone Encounter (Signed)
Pt's son, Abbe Amsterdam, called to let us know that they still haven't heard from [place]. He called last week and was told to call us back if they hadn't heard anything. Please call [phone]

## 2021-05-22 NOTE — Telephone Encounter (Signed)
Referral in review per [employer]. Called son, gave him phone# for [facility] to schedule appt. Advised him to let our office know if any problems.

## 2021-06-05 ENCOUNTER — Other Ambulatory Visit (HOSPITAL_COMMUNITY): Payer: Self-pay

## 2021-06-05 DIAGNOSIS — D500 Iron deficiency anemia secondary to blood loss (chronic): Secondary | ICD-10-CM

## 2021-06-09 ENCOUNTER — Encounter: Payer: Self-pay | Admitting: Physician Assistant

## 2021-06-12 NOTE — Progress Notes (Signed)
Virtual Visit via Telephone Note [facility]  I connected with Wanda [name]  on 06/13/21  at 12:15 PM by telephone and verified that I am speaking with the correct person using two identifiers.  Location: Patient: Home Provider: [place]   I discussed the limitations, risks, security and privacy concerns of performing an evaluation and management service by telephone and the availability of in person appointments. I also discussed with the patient that there may be a patient responsible charge related to this service. The patient expressed understanding and agreed to proceed.   History of Present Illness: Wanda [name] is contacted today for follow-up of her iron deficiency anemia (secondary to chronic intermittent GI bleeding) and B12 deficiency.  She was last contacted by Tarri Abernethy PA-C on 05/15/2021.  Her last IV iron infusion was Feraheme on 02/07/2021 and 02/14/2021.  At today's visit, she reports feeling fairly well, apart from significant fatigue.  No recent hospital stays or changes in her baseline health status.  She reports that she feels fatigued and tires easily.  She is frustrated that she is not able to do as much housework and yard work as she used to.  Despite her significant fatigue, patient denies any bleeding episodes.  She reports that she always has black stools from the iron, and that she can "tell the difference" when it is melena.  She denies any epistaxis, hematemesis, hemoptysis, hematuria, hematochezia, or melena.  She denies any chest pain, dyspnea, syncope, or palpitations.  No B symptoms such as fever, chills, night sweats, unintentional weight loss.  She reports that her energy level is 25%, with 100% appetite.  She is eating well to maintain a stable weight at this time.    Observations/Objective: Review of Systems  Constitutional:  Positive for malaise/fatigue. Negative for chills, diaphoresis, fever and  weight loss.  Respiratory:  Negative for cough and shortness of breath.   Cardiovascular:  Negative for chest pain and palpitations.  Gastrointestinal:  Negative for abdominal pain, blood in stool, melena, nausea and vomiting.  Neurological:  Negative for dizziness and headaches.    PHYSICAL EXAM (per limitations of virtual telephone visit): The patient is alert and oriented x 3, exhibiting adequate mentation, good mood, and ability to speak in full sentences and execute sound judgement.   ASSESSMENT & PLAN: 1.  Iron deficiency anemia secondary to chronic GI blood loss - Patient has had intermittent GI bleeding over the past several years, about once per year.  Has had multiple units of PRBCs, as well as intermittent IV iron. - Patient had EGD in 2016 by Dr. Gala Romney with a large hiatal hernia, otherwise normal.  Previously noted multiple [name] lesions in 2014.  Capsule study in 2013 without explanation for IDA.  Last colonoscopy in 2013 with colonic diverticulosis. - Patient was hospitalized from 03/22/2021 through 03/23/2021 due to an episode of melena.  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 - Last iron infusion was with Feraheme on 02/07/2021 and 02/14/2021. - Most recent labs (obtained 06/09/2021 at [facility] laboratory and [place]): Hgb 11.6, MCV 97.6, ferritin 245, TIBC 245, iron saturation 24% - PLAN: Continue oral iron.  No indication for IV iron at this time.  RTC in 4 months with repeat labs, but patient can call clinic if she experiences any bleeding and needs to come back sooner.  2.  Vitamin B12 deficiency: - Most recent  B12 was 430 (04/22/2021) - PLAN: Continue monthly B12 injections from her primary care provider    Follow Up Instructions: -Labs in 4 months ([facility] [place]) - Office visit after labs    I discussed the assessment and treatment plan with the patient. The  patient was provided an opportunity to ask questions and all were answered. The patient agreed with the plan and demonstrated an understanding of the instructions.   The patient was advised to call back or seek an in-person evaluation if the symptoms worsen or if the condition fails to improve as anticipated.  I provided 9 minutes of non-face-to-face time during this encounter.   Harriett Rush, PA-C 06/13/2021 12:24 PM

## 2021-06-13 ENCOUNTER — Inpatient Hospital Stay (HOSPITAL_COMMUNITY): Payer: Medicare Other | Attending: Hematology and Oncology | Admitting: Physician Assistant

## 2021-06-13 ENCOUNTER — Other Ambulatory Visit: Payer: Self-pay

## 2021-06-13 DIAGNOSIS — E538 Deficiency of other specified B group vitamins: Secondary | ICD-10-CM | POA: Diagnosis not present

## 2021-06-13 DIAGNOSIS — D500 Iron deficiency anemia secondary to blood loss (chronic): Secondary | ICD-10-CM | POA: Diagnosis not present

## 2021-06-25 ENCOUNTER — Ambulatory Visit: Payer: Self-pay | Admitting: General Surgery

## 2021-06-25 ENCOUNTER — Other Ambulatory Visit: Payer: Self-pay | Admitting: General Surgery

## 2021-06-25 DIAGNOSIS — K257 Chronic gastric ulcer without hemorrhage or perforation: Secondary | ICD-10-CM

## 2021-06-25 DIAGNOSIS — D500 Iron deficiency anemia secondary to blood loss (chronic): Secondary | ICD-10-CM

## 2021-06-25 NOTE — H&P (Signed)
Subjective   Chief Complaint: Hernia       History of Present Illness: Wanda [name] is a 87 y.o. female who is seen today as an office consultation at the request of Dr. 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  Patient has been seen by Dr. Buford Dresser Patient's been hospitalized on several occasions as well as required several transfusions and iron transfusions.   Patient describes fatigue and tiredness which has been increasing recently. Patient did have an endoscopy which revealed multiple [name]'s ulcers.     Review of Systems: A complete review of systems was obtained from the patient.  I have reviewed this information and discussed as appropriate with the patient.  See HPI as well for other ROS.   Review of Systems  Constitutional: Positive for malaise/fatigue. Negative for fever.  HENT: Negative for congestion.   Eyes: Negative for blurred vision.  Respiratory: Negative for cough, shortness of breath and wheezing.   Cardiovascular: Negative for chest pain and palpitations.  Gastrointestinal: Negative for heartburn.  Genitourinary: Negative for dysuria.  Musculoskeletal: Negative for myalgias.  Skin: Negative for rash.  Neurological: Negative for dizziness and headaches.  Psychiatric/Behavioral: Negative for depression and suicidal ideas.  All other systems reviewed and are negative.       Medical History: Past Medical History History reviewed. No pertinent past medical history.    There is no problem list on file for this patient.     Past Surgical History History reviewed. No pertinent surgical history.     Allergies Allergies Allergen Reactions  Sulfa (Sulfonamide Antibiotics) Other (See Comments)     unknown  Naproxen Sodium Other (See Comments) and Rash     Passed out      Current Outpatient Medications on File Prior to  Visit Medication Sig Dispense Refill  calcium-D3-zinc-copper-mangan 325 mg-12.5 mcg -2.75 mg Tab Take by mouth      cephalexin (KEFLEX) 500 MG capsule        cyanocobalamin (VITAMIN B12) 1,000 mcg/mL injection Inject into the muscle      doxycycline (VIBRAMYCIN) 100 MG capsule        HYDROcodone-acetaminophen (NORCO) 5-325 mg tablet Take by mouth      lisinopriL-hydrochlorothiazide (ZESTORETIC) 20-12.5 mg tablet Take by mouth      multivitamin (MULTIVITAMIN) tablet Take by mouth      pantoprazole (PROTONIX) 40 MG DR tablet Take by mouth       No current facility-administered medications on file prior to visit.     Family History History reviewed. No pertinent family history.     Social History   Tobacco Use Smoking Status Not on file Smokeless Tobacco Not on file     Social History Social History    Socioeconomic History  Marital status: Widowed      Objective:     Vitals:   06/25/21 1418 BP: (!) 152/80 Pulse: 75 Temp: 37.1 C (98.7 F) SpO2: 99% Weight: 49.4 kg (108 lb 12.8 oz) Height: 142.2 cm ('4\' 8"'$ )   Body mass index is 24.39 kg/m.   Physical Exam Constitutional:      Appearance: Normal appearance.  HENT:     Head: Normocephalic and atraumatic.     Mouth/Throat:     Mouth: Mucous membranes are moist.     Pharynx: Oropharynx is clear.  Eyes:     General: No scleral icterus.    Pupils: Pupils are equal, round, and reactive to light.  Cardiovascular:     Rate and Rhythm: Normal rate and regular rhythm.     Pulses: Normal pulses.     Heart sounds: No murmur heard.   No friction rub. No gallop.  Pulmonary:     Effort: Pulmonary effort is normal. No respiratory distress.     Breath sounds: Normal breath sounds. No stridor.  Abdominal:     General: Abdomen is flat.  Musculoskeletal:        General: No swelling.  Skin:    General: Skin is warm.  Neurological:     General: No focal deficit present.     Mental Status: She is alert and oriented to  person, place, and time. Mental status is at baseline.  Psychiatric:        Mood and Affect: Mood normal.        Thought Content: Thought content normal.        Judgment: Judgment normal.            Labs, Imaging and Diagnostic Testing: Patient's most recent hemoglobin 9.8. Assessment and Plan: Diagnoses and all orders for this visit:   Acute [name] ulcer   Iron deficiency anemia due to chronic blood loss       Patient is a 87 year old female with iron deficiency anemia secondary to hiatal hernia [name]'s ulcers. 1.  We will proceed with a CT scan of her abdomen and pelvis to evaluate the extent of the hiatal hernia. 2.  Subsequently we will proceed with a robotic hiatal hernia pair and toupee fundoplication. 3.  I discussed with the risk and benefits of the procedure to include but not limited to: Infection, bleeding, damage to surrounding structures, possible chance of pneumothorax, possible chance of further surgery.  Patient voiced understanding wishes to proceed.   No follow-ups on file.   Ralene Ok, MD

## 2021-06-25 NOTE — H&P (View-Only) (Signed)
Subjective   Chief Complaint: Hernia       History of Present Illness: Wanda [name] is a 87 y.o. female who is seen today as an office consultation at the request of Dr. 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  Patient has been seen by Dr. Buford Dresser Patient's been hospitalized on several occasions as well as required several transfusions and iron transfusions.   Patient describes fatigue and tiredness which has been increasing recently. Patient did have an endoscopy which revealed multiple [name]'s ulcers.     Review of Systems: A complete review of systems was obtained from the patient.  I have reviewed this information and discussed as appropriate with the patient.  See HPI as well for other ROS.   Review of Systems  Constitutional: Positive for malaise/fatigue. Negative for fever.  HENT: Negative for congestion.   Eyes: Negative for blurred vision.  Respiratory: Negative for cough, shortness of breath and wheezing.   Cardiovascular: Negative for chest pain and palpitations.  Gastrointestinal: Negative for heartburn.  Genitourinary: Negative for dysuria.  Musculoskeletal: Negative for myalgias.  Skin: Negative for rash.  Neurological: Negative for dizziness and headaches.  Psychiatric/Behavioral: Negative for depression and suicidal ideas.  All other systems reviewed and are negative.       Medical History: Past Medical History History reviewed. No pertinent past medical history.    There is no problem list on file for this patient.     Past Surgical History History reviewed. No pertinent surgical history.     Allergies Allergies Allergen Reactions  Sulfa (Sulfonamide Antibiotics) Other (See Comments)     unknown  Naproxen Sodium Other (See Comments) and Rash     Passed out      Current Outpatient Medications on File Prior to  Visit Medication Sig Dispense Refill  calcium-D3-zinc-copper-mangan 325 mg-12.5 mcg -2.75 mg Tab Take by mouth      cephalexin (KEFLEX) 500 MG capsule        cyanocobalamin (VITAMIN B12) 1,000 mcg/mL injection Inject into the muscle      doxycycline (VIBRAMYCIN) 100 MG capsule        HYDROcodone-acetaminophen (NORCO) 5-325 mg tablet Take by mouth      lisinopriL-hydrochlorothiazide (ZESTORETIC) 20-12.5 mg tablet Take by mouth      multivitamin (MULTIVITAMIN) tablet Take by mouth      pantoprazole (PROTONIX) 40 MG DR tablet Take by mouth       No current facility-administered medications on file prior to visit.     Family History History reviewed. No pertinent family history.     Social History   Tobacco Use Smoking Status Not on file Smokeless Tobacco Not on file     Social History Social History    Socioeconomic History  Marital status: Widowed      Objective:     Vitals:   06/25/21 1418 BP: (!) 152/80 Pulse: 75 Temp: 37.1 C (98.7 F) SpO2: 99% Weight: 49.4 kg (108 lb 12.8 oz) Height: 142.2 cm ('4\' 8"'$ )   Body mass index is 24.39 kg/m.   Physical Exam Constitutional:      Appearance: Normal appearance.  HENT:     Head: Normocephalic and atraumatic.     Mouth/Throat:     Mouth: Mucous membranes are moist.     Pharynx: Oropharynx is clear.  Eyes:     General: No scleral icterus.    Pupils: Pupils are equal, round, and reactive to light.  Cardiovascular:     Rate and Rhythm: Normal rate and regular rhythm.     Pulses: Normal pulses.     Heart sounds: No murmur heard.   No friction rub. No gallop.  Pulmonary:     Effort: Pulmonary effort is normal. No respiratory distress.     Breath sounds: Normal breath sounds. No stridor.  Abdominal:     General: Abdomen is flat.  Musculoskeletal:        General: No swelling.  Skin:    General: Skin is warm.  Neurological:     General: No focal deficit present.     Mental Status: She is alert and oriented to  person, place, and time. Mental status is at baseline.  Psychiatric:        Mood and Affect: Mood normal.        Thought Content: Thought content normal.        Judgment: Judgment normal.            Labs, Imaging and Diagnostic Testing: Patient's most recent hemoglobin 9.8. Assessment and Plan: Diagnoses and all orders for this visit:   Acute [name] ulcer   Iron deficiency anemia due to chronic blood loss       Patient is a 87 year old female with iron deficiency anemia secondary to hiatal hernia [name]'s ulcers. 1.  We will proceed with a CT scan of her abdomen and pelvis to evaluate the extent of the hiatal hernia. 2.  Subsequently we will proceed with a robotic hiatal hernia pair and toupee fundoplication. 3.  I discussed with the risk and benefits of the procedure to include but not limited to: Infection, bleeding, damage to surrounding structures, possible chance of pneumothorax, possible chance of further surgery.  Patient voiced understanding wishes to proceed.   No follow-ups on file.   Ralene Ok, MD

## 2021-06-27 ENCOUNTER — Other Ambulatory Visit: Payer: Self-pay | Admitting: General Surgery

## 2021-06-27 ENCOUNTER — Other Ambulatory Visit (HOSPITAL_COMMUNITY): Payer: Self-pay | Admitting: General Surgery

## 2021-06-27 DIAGNOSIS — K449 Diaphragmatic hernia without obstruction or gangrene: Secondary | ICD-10-CM

## 2021-07-02 ENCOUNTER — Ambulatory Visit (HOSPITAL_COMMUNITY)
Admission: RE | Admit: 2021-07-02 | Discharge: 2021-07-02 | Disposition: A | Discharge status: Home / Self Care | Payer: Medicare Other | Source: Ambulatory Visit | Attending: General Surgery | Admitting: General Surgery

## 2021-07-02 ENCOUNTER — Other Ambulatory Visit: Payer: Self-pay

## 2021-07-02 DIAGNOSIS — K449 Diaphragmatic hernia without obstruction or gangrene: Secondary | ICD-10-CM | POA: Diagnosis present

## 2021-07-03 ENCOUNTER — Telehealth (HOSPITAL_COMMUNITY): Payer: Self-pay | Admitting: *Deleted

## 2021-07-03 ENCOUNTER — Other Ambulatory Visit (HOSPITAL_COMMUNITY): Payer: Self-pay | Admitting: *Deleted

## 2021-07-03 DIAGNOSIS — D500 Iron deficiency anemia secondary to blood loss (chronic): Secondary | ICD-10-CM

## 2021-07-03 NOTE — Telephone Encounter (Signed)
Son called to advise that Dr Rosendo Gros will be performing a hiatal hernia repair with fundiplication on Tuesday and that Dr. Rosendo Gros requested a consult for IDA, not realizing that she is followed by our office.  Wanted labs to be assessed prior to surgery to determine if iron was needed prior to surgery.  Lab requests faxed to [facility] for CBC/D, Iron panel and Ferritin to be drawn STAT today and faxed to [place] for review.

## 2021-07-04 ENCOUNTER — Other Ambulatory Visit: Payer: Self-pay

## 2021-07-04 ENCOUNTER — Encounter (HOSPITAL_COMMUNITY): Payer: Self-pay

## 2021-07-04 ENCOUNTER — Encounter (HOSPITAL_COMMUNITY)
Admission: RE | Admit: 2021-07-04 | Discharge: 2021-07-04 | Disposition: A | Discharge status: Home / Self Care | Payer: Medicare Other | Source: Ambulatory Visit | Attending: General Surgery | Admitting: General Surgery

## 2021-07-04 DIAGNOSIS — Z20822 Contact with and (suspected) exposure to covid-19: Secondary | ICD-10-CM | POA: Diagnosis not present

## 2021-07-04 DIAGNOSIS — Z01812 Encounter for preprocedural laboratory examination: Secondary | ICD-10-CM | POA: Diagnosis present

## 2021-07-04 LAB — BASIC METABOLIC PANEL
Anion gap: 8 (ref 5–15)
BUN: 18 mg/dL (ref 8–23)
CO2: 27 mmol/L (ref 22–32)
Calcium: 9.5 mg/dL (ref 8.9–10.3)
Chloride: 102 mmol/L (ref 98–111)
Creatinine, Ser: 0.71 mg/dL (ref 0.44–1.00)
GFR, Estimated: >60 mL/min (ref >60)
Glucose, Bld: 91 mg/dL (ref 70–99)
Potassium: 3.7 mmol/L (ref 3.5–5.1)
Sodium: 137 mmol/L (ref 135–145)

## 2021-07-04 LAB — CBC
HCT: 38.8 % (ref 36.0–46.0)
Hemoglobin: 12.4 g/dL (ref 12.0–15.0)
MCH: 31.1 pg (ref 26.0–34.0)
MCHC: 32 g/dL (ref 30.0–36.0)
MCV: 97.2 fL (ref 80.0–100.0)
Platelets: 327 10*3/uL (ref 150–400)
RBC: 3.99 MIL/uL (ref 3.87–5.11)
RDW: 12.3 % (ref 11.5–15.5)
WBC: 8.8 10*3/uL (ref 4.0–10.5)
nRBC: 0 % (ref 0.0–0.2)

## 2021-07-04 LAB — SARS CORONAVIRUS 2 (TAT 6-24 HRS): SARS Coronavirus 2: NEGATIVE

## 2021-07-04 NOTE — Progress Notes (Signed)
Your procedure is scheduled on Tuesday 07/08/21.  Report to [facility] Main Entrance "A" at 08:30 A.M., and check in at the Admitting office.  Call this number if you have problems the morning of surgery: [phone]  Call [phone] if you have any questions prior to your surgery date Monday-Friday 8am-4pm   Remember: Do not eat after midnight the night before your surgery  You may drink clear liquids until 07:30 AM the morning of your surgery.   Clear liquids allowed are: Water, Non-Citrus Juices (without pulp), Carbonated Beverages, Clear Tea, Black Coffee Only, and Gatorade  Please complete your PRE-SURGERY WATER that was provided to you by 07:30 AM the morning of your surgery. DO NOT SIP.    Take these medicines the morning of surgery with A SIP OF WATER: pantoprazole (PROTONIX)    As of today, STOP taking any Aspirin (unless otherwise instructed by your surgeon), Aleve, Naproxen, Ibuprofen, Motrin, Advil, Goody's, BC's, all herbal medications, fish oil, and all vitamins.    The Morning of Surgery  Do not wear jewelry, make-up or nail polish.  Do not wear lotions, powders, or perfumes, or deodorant  Do not shave 48 hours prior to surgery.    Do not bring valuables to the hospital.  [facility] is not responsible for any belongings or valuables.  If you are a smoker, DO NOT Smoke 24 hours prior to surgery  If you wear a CPAP at night please bring your mask the morning of surgery   Remember that you must have someone to transport you home after your surgery, and remain with you for 24 hours if you are discharged the same day.   Please bring cases for contacts, glasses, hearing aids, dentures or bridgework because it cannot be worn into surgery.    Leave your suitcase in the car.  After surgery it may be brought to your room.  For patients admitted to the hospital, discharge time will be determined by your treatment team.  Patients discharged the day of surgery  will not be allowed to drive home.    Special instructions:   [place]- Preparing For Surgery  Before surgery, you can play an important role. Because skin is not sterile, your skin needs to be as free of germs as possible. You can reduce the number of germs on your skin by washing with CHG (chlorahexidine gluconate) Soap before surgery.  CHG is an antiseptic cleaner which kills germs and bonds with the skin to continue killing germs even after washing.    Oral Hygiene is also important to reduce your risk of infection.  Remember - BRUSH YOUR TEETH THE MORNING OF SURGERY WITH YOUR REGULAR TOOTHPASTE  Please do not use if you have an allergy to CHG or antibacterial soaps. If your skin becomes reddened/irritated stop using the CHG.  Do not shave (including legs and underarms) for at least 48 hours prior to first CHG shower. It is OK to shave your face.  Please follow these instructions carefully.   Shower the NIGHT BEFORE SURGERY and the MORNING OF SURGERY with CHG Soap.   If you chose to wash your hair and body, wash as usual with your normal shampoo and body-wash/soap.  Rinse your hair and body thoroughly to remove the shampoo and soap.  Apply CHG directly to the skin (ONLY FROM THE NECK DOWN) and wash gently with a scrungie or a clean washcloth.   Do not use on open wounds or open sores. Avoid contact with  your eyes, ears, mouth and genitals (private parts). Wash Face and genitals (private parts)  with your normal soap.   Wash thoroughly, paying special attention to the area where your surgery will be performed.  Thoroughly rinse your body with warm water from the neck down.  DO NOT shower/wash with your normal soap after using and rinsing off the CHG Soap.  Pat yourself dry with a CLEAN TOWEL.  Wear CLEAN PAJAMAS to bed the night before surgery  Place CLEAN SHEETS on your bed the night of your first shower and DO NOT SLEEP WITH PETS.  Wear comfortable clothes the morning of  surgery.     Day of Surgery:  Please shower the morning of surgery with the CHG soap Do not apply any deodorants/lotions. Please wear clean clothes to the hospital/surgery center.   Remember to brush your teeth WITH YOUR REGULAR TOOTHPASTE.   Please read over the following fact sheets that you were given.

## 2021-07-04 NOTE — Progress Notes (Signed)
CBC and Iron/TIBC results received from [facility]. Per [facility], Ferritin will not be resulted until Monday at the earliest. Dr. Delton Coombes made aware and order received to give '510mg'$  Feraheme x1 on Monday 08/22.  Patient scheduled. Abbe Amsterdam, son, updated and verbalized understanding. I have attempted to reach out to [place] to notify them of results, awaiting call back.

## 2021-07-04 NOTE — Progress Notes (Signed)
PCP - Lethea Killings, FNP Cardiologist - denies (pt has hx of heart murmur but cannot recall who or when she was told this - she denies having a cardiologist and only knows she takes medication for high blood pressure and states she has only ever had high blood pressure as her only heart "issue" and checks her pressures daily)  Chest x-ray -  EKG - 07/04/21 Stress Test -  ECHO -  Cardiac Cath -   ERAS Protcol - yes PRE-SURGERY Ensure or G2- ensure  COVID TEST- 07/04/21   Anesthesia review: n/a  Patient denies shortness of breath, fever, cough and chest pain at PAT appointment   All instructions explained to the patient, with a verbal understanding of the material. Patient agrees to go over the instructions while at home for a better understanding. Patient also instructed to self quarantine after being tested for COVID-19. The opportunity to ask questions was provided.

## 2021-07-07 ENCOUNTER — Inpatient Hospital Stay (HOSPITAL_COMMUNITY): Payer: Medicare Other | Attending: Hematology and Oncology

## 2021-07-07 ENCOUNTER — Other Ambulatory Visit: Payer: Self-pay

## 2021-07-07 ENCOUNTER — Encounter: Payer: Self-pay | Admitting: Internal Medicine

## 2021-07-07 VITALS — BP 105/51 | HR 68 | Temp 97.2°F | Resp 16

## 2021-07-07 DIAGNOSIS — D500 Iron deficiency anemia secondary to blood loss (chronic): Secondary | ICD-10-CM | POA: Insufficient documentation

## 2021-07-07 DIAGNOSIS — K922 Gastrointestinal hemorrhage, unspecified: Secondary | ICD-10-CM | POA: Insufficient documentation

## 2021-07-07 DIAGNOSIS — E538 Deficiency of other specified B group vitamins: Secondary | ICD-10-CM

## 2021-07-07 MED ORDER — DIPHENHYDRAMINE HCL 25 MG PO CAPS
25.0000 mg | ORAL_CAPSULE | Freq: Once | ORAL | Status: AC
  Administered 2021-07-07: 25 mg via ORAL
  Filled 2021-07-07: qty 1

## 2021-07-07 MED ORDER — SODIUM CHLORIDE 0.9 % IV SOLN: Freq: Once | INTRAVENOUS | Status: AC

## 2021-07-07 MED ORDER — SODIUM CHLORIDE 0.9 % IV SOLN
510.0000 mg | Freq: Once | INTRAVENOUS | Status: AC
  Administered 2021-07-07: 510 mg via INTRAVENOUS
  Filled 2021-07-07: qty 510

## 2021-07-07 NOTE — Progress Notes (Signed)
Pt presents today for Feraheme IV iron infusion per provider's order. Vital signs stable and pt voiced no new complaints at this time.  Peripheral IV started with good blood return pre and post infusion.  Feraheme given today per MD orders. Tolerated infusion without adverse affects. Vital signs stable. No complaints at this time. Discharged from clinic ambulatory in stable condition. Alert and oriented x 3. F/U with [facility] as scheduled.    

## 2021-07-07 NOTE — Patient Instructions (Signed)
[facility]  Discharge Instructions: Thank you for choosing [facility] to provide your oncology and hematology care.  If you have a lab appointment with the Cancer Center, please come in thru the Main Entrance and check in at the main information desk.  Wear comfortable clothing and clothing appropriate for easy access to any Portacath or PICC line.   We strive to give you quality time with your provider. You may need to reschedule your appointment if you arrive late (15 or more minutes).  Arriving late affects you and other patients whose appointments are after yours.  Also, if you miss three or more appointments without notifying the office, you may be dismissed from the clinic at the provider's discretion.      For prescription refill requests, have your pharmacy contact our office and allow 72 hours for refills to be completed.    Today you received Feraheme IV iron infusion.     BELOW ARE SYMPTOMS THAT SHOULD BE REPORTED IMMEDIATELY: *FEVER GREATER THAN 100.4 F (38 C) OR HIGHER *CHILLS OR SWEATING *NAUSEA AND VOMITING THAT IS NOT CONTROLLED WITH YOUR NAUSEA MEDICATION *UNUSUAL SHORTNESS OF BREATH *UNUSUAL BRUISING OR BLEEDING *URINARY PROBLEMS (pain or burning when urinating, or frequent urination) *BOWEL PROBLEMS (unusual diarrhea, constipation, pain near the anus) TENDERNESS IN MOUTH AND THROAT WITH OR WITHOUT PRESENCE OF ULCERS (sore throat, sores in mouth, or a toothache) UNUSUAL RASH, SWELLING OR PAIN  UNUSUAL VAGINAL DISCHARGE OR ITCHING   Items with * indicate a potential emergency and should be followed up as soon as possible or go to the Emergency Department if any problems should occur.  Please show the CHEMOTHERAPY ALERT CARD or IMMUNOTHERAPY ALERT CARD at check-in to the Emergency Department and triage nurse.  Should you have questions after your visit or need to cancel or reschedule your appointment, please contact  [facility]  [phone]  and follow the prompts.  Office hours are 8:00 a.m. to 4:30 p.m. Monday - Friday. Please note that voicemails left after 4:00 p.m. may not be returned until the following business day.  We are closed weekends and major holidays. You have access to a nurse at all times for urgent questions. Please call the main number to the clinic [phone] and follow the prompts.  For any non-urgent questions, you may also contact your provider using MyChart. We now offer e-Visits for anyone 18 and older to request care online for non-urgent symptoms. For details visit [email or web address].   Also download the MyChart app! Go to the app store, search "MyChart", open the app, select [facility], and log in with your MyChart username and password.  Due to Covid, a mask is required upon entering the hospital/clinic. If you do not have a mask, one will be given to you upon arrival. For doctor visits, patients may have 1 support person aged 18 or older with them. For treatment visits, patients cannot have anyone with them due to current Covid guidelines and our immunocompromised population.  

## 2021-07-08 ENCOUNTER — Ambulatory Visit (HOSPITAL_COMMUNITY): Payer: Medicare Other

## 2021-07-08 ENCOUNTER — Encounter (HOSPITAL_COMMUNITY)
Admission: RE | Disposition: A | Discharge status: Home / Self Care | Payer: Self-pay | Source: Home / Self Care | Attending: General Surgery

## 2021-07-08 ENCOUNTER — Encounter (HOSPITAL_COMMUNITY): Payer: Self-pay | Admitting: General Surgery

## 2021-07-08 ENCOUNTER — Observation Stay (HOSPITAL_COMMUNITY)
Admission: RE | Admit: 2021-07-08 | Discharge: 2021-07-09 | Disposition: A | Discharge status: Home / Self Care | Payer: Medicare Other | Attending: General Surgery | Admitting: General Surgery

## 2021-07-08 DIAGNOSIS — K449 Diaphragmatic hernia without obstruction or gangrene: Secondary | ICD-10-CM | POA: Diagnosis not present

## 2021-07-08 DIAGNOSIS — Z8719 Personal history of other diseases of the digestive system: Secondary | ICD-10-CM

## 2021-07-08 DIAGNOSIS — Z9889 Other specified postprocedural states: Secondary | ICD-10-CM

## 2021-07-08 HISTORY — PX: INSERTION OF MESH: SHX5868

## 2021-07-08 HISTORY — PX: XI ROBOTIC ASSISTED HIATAL HERNIA REPAIR: SHX6889

## 2021-07-08 SURGERY — REPAIR, HERNIA, HIATAL, ROBOT-ASSISTED
Anesthesia: General | Site: Abdomen

## 2021-07-08 MED ORDER — LIDOCAINE 2% (20 MG/ML) 5 ML SYRINGE
INTRAMUSCULAR | Status: DC | PRN
  Administered 2021-07-08: 60 mg via INTRAVENOUS

## 2021-07-08 MED ORDER — ONDANSETRON 4 MG PO TBDP: 4.0000 mg | ORAL_TABLET | Freq: Four times a day (QID) | ORAL | Status: DC | PRN

## 2021-07-08 MED ORDER — FENTANYL CITRATE (PF) 100 MCG/2ML IJ SOLN
INTRAMUSCULAR | Status: AC
  Filled 2021-07-08: qty 2

## 2021-07-08 MED ORDER — ENSURE PRE-SURGERY PO LIQD: 296.0000 mL | Freq: Once | ORAL | Status: DC

## 2021-07-08 MED ORDER — FENTANYL CITRATE (PF) 250 MCG/5ML IJ SOLN
INTRAMUSCULAR | Status: DC | PRN
  Administered 2021-07-08: 150 ug via INTRAVENOUS
  Administered 2021-07-08 (×2): 50 ug via INTRAVENOUS

## 2021-07-08 MED ORDER — CEFAZOLIN SODIUM-DEXTROSE 2-4 GM/100ML-% IV SOLN
2.0000 g | INTRAVENOUS | Status: DC
  Filled 2021-07-08: qty 100

## 2021-07-08 MED ORDER — HYDROMORPHONE HCL 1 MG/ML IJ SOLN: 1.0000 mg | INTRAMUSCULAR | Status: DC | PRN

## 2021-07-08 MED ORDER — OXYCODONE HCL 5 MG PO TABS: 5.0000 mg | ORAL_TABLET | Freq: Once | ORAL | Status: DC | PRN

## 2021-07-08 MED ORDER — ORAL CARE MOUTH RINSE: 15.0000 mL | Freq: Once | OROMUCOSAL | Status: AC

## 2021-07-08 MED ORDER — CHLORHEXIDINE GLUCONATE CLOTH 2 % EX PADS: 6.0000 | MEDICATED_PAD | Freq: Once | CUTANEOUS | Status: DC

## 2021-07-08 MED ORDER — FENTANYL CITRATE (PF) 250 MCG/5ML IJ SOLN
INTRAMUSCULAR | Status: AC
  Filled 2021-07-08: qty 5

## 2021-07-08 MED ORDER — ATROPINE SULFATE 0.4 MG/ML IJ SOLN
INTRAMUSCULAR | Status: AC
  Filled 2021-07-08: qty 1

## 2021-07-08 MED ORDER — SUGAMMADEX SODIUM 200 MG/2ML IV SOLN
INTRAVENOUS | Status: DC | PRN
  Administered 2021-07-08: 200 mg via INTRAVENOUS

## 2021-07-08 MED ORDER — ENOXAPARIN SODIUM 30 MG/0.3ML IJ SOSY
30.0000 mg | PREFILLED_SYRINGE | INTRAMUSCULAR | Status: DC
  Administered 2021-07-09: 30 mg via SUBCUTANEOUS
  Filled 2021-07-08: qty 0.3

## 2021-07-08 MED ORDER — 0.9 % SODIUM CHLORIDE (POUR BTL) OPTIME
TOPICAL | Status: DC | PRN
  Administered 2021-07-08: 1000 mL

## 2021-07-08 MED ORDER — ONDANSETRON HCL 4 MG/2ML IJ SOLN: 4.0000 mg | Freq: Four times a day (QID) | INTRAMUSCULAR | Status: DC | PRN

## 2021-07-08 MED ORDER — PHENYLEPHRINE HCL-NACL 20-0.9 MG/250ML-% IV SOLN
INTRAVENOUS | Status: DC | PRN
  Administered 2021-07-08: 25 ug/min via INTRAVENOUS

## 2021-07-08 MED ORDER — BUPIVACAINE HCL (PF) 0.25 % IJ SOLN
INTRAMUSCULAR | Status: DC | PRN
  Administered 2021-07-08: 15 mL

## 2021-07-08 MED ORDER — FENTANYL CITRATE (PF) 100 MCG/2ML IJ SOLN
25.0000 ug | INTRAMUSCULAR | Status: DC | PRN
  Administered 2021-07-08: 25 ug via INTRAVENOUS

## 2021-07-08 MED ORDER — PHENYLEPHRINE 40 MCG/ML (10ML) SYRINGE FOR IV PUSH (FOR BLOOD PRESSURE SUPPORT)
PREFILLED_SYRINGE | INTRAVENOUS | Status: DC | PRN
  Administered 2021-07-08: 80 ug via INTRAVENOUS

## 2021-07-08 MED ORDER — LACTATED RINGERS IV SOLN: INTRAVENOUS | Status: DC

## 2021-07-08 MED ORDER — PROPOFOL 10 MG/ML IV BOLUS
INTRAVENOUS | Status: DC | PRN
  Administered 2021-07-08: 100 mg via INTRAVENOUS

## 2021-07-08 MED ORDER — PHENYLEPHRINE HCL (PRESSORS) 10 MG/ML IV SOLN
INTRAVENOUS | Status: AC
  Filled 2021-07-08: qty 2

## 2021-07-08 MED ORDER — ROCURONIUM BROMIDE 10 MG/ML (PF) SYRINGE
PREFILLED_SYRINGE | INTRAVENOUS | Status: DC | PRN
  Administered 2021-07-08: 50 mg via INTRAVENOUS
  Administered 2021-07-08: 10 mg via INTRAVENOUS

## 2021-07-08 MED ORDER — EPHEDRINE SULFATE-NACL 50-0.9 MG/10ML-% IV SOSY
PREFILLED_SYRINGE | INTRAVENOUS | Status: DC | PRN
Start: 2021-07-08 — End: 2021-07-08
  Administered 2021-07-08: 5 mg via INTRAVENOUS

## 2021-07-08 MED ORDER — SODIUM CHLORIDE 0.9 % IV SOLN
INTRAVENOUS | Status: DC | PRN
  Administered 2021-07-08: 40 mL

## 2021-07-08 MED ORDER — BUPIVACAINE HCL (PF) 0.25 % IJ SOLN
INTRAMUSCULAR | Status: AC
  Filled 2021-07-08: qty 30

## 2021-07-08 MED ORDER — CHLORHEXIDINE GLUCONATE 0.12 % MT SOLN
15.0000 mL | Freq: Once | OROMUCOSAL | Status: AC
  Administered 2021-07-08: 15 mL via OROMUCOSAL
  Filled 2021-07-08: qty 15

## 2021-07-08 MED ORDER — BUPIVACAINE LIPOSOME 1.3 % IJ SUSP
INTRAMUSCULAR | Status: AC
  Filled 2021-07-08: qty 20

## 2021-07-08 MED ORDER — DEXAMETHASONE SODIUM PHOSPHATE 10 MG/ML IJ SOLN
INTRAMUSCULAR | Status: DC | PRN
  Administered 2021-07-08: 4 mg via INTRAVENOUS

## 2021-07-08 MED ORDER — ONDANSETRON HCL 4 MG/2ML IJ SOLN
INTRAMUSCULAR | Status: DC | PRN
Start: 2021-07-08 — End: 2021-07-08
  Administered 2021-07-08: 4 mg via INTRAVENOUS

## 2021-07-08 MED ORDER — PROPOFOL 10 MG/ML IV BOLUS
INTRAVENOUS | Status: AC
  Filled 2021-07-08: qty 20

## 2021-07-08 MED ORDER — OXYCODONE HCL 5 MG/5ML PO SOLN: 5.0000 mg | Freq: Once | ORAL | Status: DC | PRN

## 2021-07-08 MED ORDER — CEFAZOLIN SODIUM-DEXTROSE 2-3 GM-%(50ML) IV SOLR
INTRAVENOUS | Status: DC | PRN
  Administered 2021-07-08: 2 g via INTRAVENOUS

## 2021-07-08 MED ORDER — DEXTROSE-NACL 5-0.9 % IV SOLN: INTRAVENOUS | Status: DC

## 2021-07-08 MED ORDER — ACETAMINOPHEN 500 MG PO TABS
1000.0000 mg | ORAL_TABLET | ORAL | Status: AC
  Administered 2021-07-08: 1000 mg via ORAL
  Filled 2021-07-08: qty 2

## 2021-07-08 SURGICAL SUPPLY — 60 items
APPLIER CLIP 5 13 M/L LIGAMAX5 (MISCELLANEOUS)
CANNULA REDUC XI 12-8 STAPL (CANNULA) ×1
CANNULA REDUCER 12-8 DVNC XI (CANNULA) ×1 IMPLANT
CHLORAPREP W/TINT 26 (MISCELLANEOUS) ×2 IMPLANT
CLIP APPLIE 5 13 M/L LIGAMAX5 (MISCELLANEOUS) IMPLANT
COVER MAYO STAND STRL (DRAPES) ×2 IMPLANT
COVER SURGICAL LIGHT HANDLE (MISCELLANEOUS) ×2 IMPLANT
COVER TIP SHEARS 8 DVNC (MISCELLANEOUS) IMPLANT
COVER TIP SHEARS 8MM DA VINCI (MISCELLANEOUS)
DECANTER SPIKE VIAL GLASS SM (MISCELLANEOUS) ×2 IMPLANT
DEFOGGER SCOPE WARMER CLEARIFY (MISCELLANEOUS) ×2 IMPLANT
DERMABOND ADVANCED (GAUZE/BANDAGES/DRESSINGS) ×1
DERMABOND ADVANCED .7 DNX12 (GAUZE/BANDAGES/DRESSINGS) ×1 IMPLANT
DEVICE TROCAR PUNCTURE CLOSURE (ENDOMECHANICALS) ×2 IMPLANT
DRAIN PENROSE 0.5X18 (DRAIN) IMPLANT
DRAPE ARM DVNC X/XI (DISPOSABLE) ×4 IMPLANT
DRAPE CARDIOVASC SPLIT 88X140 (DRAPES) ×2 IMPLANT
DRAPE COLUMN DVNC XI (DISPOSABLE) ×1 IMPLANT
DRAPE DA VINCI XI ARM (DISPOSABLE) ×4
DRAPE DA VINCI XI COLUMN (DISPOSABLE) ×1
DRAPE ORTHO SPLIT 77X108 STRL (DRAPES) ×1
DRAPE SURG ORHT 6 SPLT 77X108 (DRAPES) ×1 IMPLANT
ELECT REM PT RETURN 9FT ADLT (ELECTROSURGICAL) ×2
ELECTRODE REM PT RTRN 9FT ADLT (ELECTROSURGICAL) ×1 IMPLANT
GLOVE SURG ENC MOIS LTX SZ7.5 (GLOVE) ×4 IMPLANT
GOWN STRL REUS W/ TWL LRG LVL3 (GOWN DISPOSABLE) ×1 IMPLANT
GOWN STRL REUS W/ TWL XL LVL3 (GOWN DISPOSABLE) ×2 IMPLANT
GOWN STRL REUS W/TWL 2XL LVL3 (GOWN DISPOSABLE) ×2 IMPLANT
GOWN STRL REUS W/TWL LRG LVL3 (GOWN DISPOSABLE) ×1
GOWN STRL REUS W/TWL XL LVL3 (GOWN DISPOSABLE) ×2
KIT BASIN OR (CUSTOM PROCEDURE TRAY) ×2 IMPLANT
KIT TURNOVER KIT B (KITS) IMPLANT
MARKER SKIN DUAL TIP RULER LAB (MISCELLANEOUS) ×2 IMPLANT
MESH BIO-A 7X10 SYN MAT (Mesh General) ×2 IMPLANT
NEEDLE 22X1 1/2 (OR ONLY) (NEEDLE) ×2 IMPLANT
NEEDLE INSUFFLATION 14GA 120MM (NEEDLE) ×2 IMPLANT
OBTURATOR OPTICAL STANDARD 8MM (TROCAR)
OBTURATOR OPTICAL STND 8 DVNC (TROCAR)
OBTURATOR OPTICALSTD 8 DVNC (TROCAR) IMPLANT
PENCIL SMOKE EVACUATOR (MISCELLANEOUS) IMPLANT
SCISSORS LAP 5X35 DISP (ENDOMECHANICALS) IMPLANT
SEAL CANN UNIV 5-8 DVNC XI (MISCELLANEOUS) ×3 IMPLANT
SEAL XI 5MM-8MM UNIVERSAL (MISCELLANEOUS) ×3
SEALER VESSEL DA VINCI XI (MISCELLANEOUS) ×1
SEALER VESSEL EXT DVNC XI (MISCELLANEOUS) ×1 IMPLANT
SET IRRIG TUBING LAPAROSCOPIC (IRRIGATION / IRRIGATOR) ×2 IMPLANT
SET TUBE SMOKE EVAC HIGH FLOW (TUBING) ×2 IMPLANT
STAPLER CANNULA SEAL DVNC XI (STAPLE) ×1 IMPLANT
STAPLER CANNULA SEAL XI (STAPLE) ×1
STAPLER VISISTAT 35W (STAPLE) IMPLANT
STOPCOCK 4 WAY LG BORE MALE ST (IV SETS) ×2 IMPLANT
SUT ETHIBOND 0 36 GRN (SUTURE) ×4 IMPLANT
SUT ETHIBOND 2 0 SH (SUTURE)
SUT ETHIBOND 2 0 SH 36X2 (SUTURE) IMPLANT
SUT MNCRL AB 4-0 PS2 18 (SUTURE) ×2 IMPLANT
SUT SILK 0 SH 30 (SUTURE) ×2 IMPLANT
SYR 30ML SLIP (SYRINGE) ×2 IMPLANT
TRAY FOLEY MTR SLVR 16FR STAT (SET/KITS/TRAYS/PACK) ×2 IMPLANT
TRAY LAPAROSCOPIC MC (CUSTOM PROCEDURE TRAY) ×2 IMPLANT
TROCAR ADV FIXATION 5X100MM (TROCAR) ×2 IMPLANT

## 2021-07-08 NOTE — Anesthesia Preprocedure Evaluation (Signed)
Anesthesia Evaluation  Patient identified by MRN, date of birth, ID band Patient awake    Reviewed: Allergy & Precautions, H&P , NPO status , Patient's Chart, lab work & pertinent test results  Airway Mallampati: II   Neck ROM: full    Dental   Pulmonary sleep apnea ,    breath sounds clear to auscultation       Cardiovascular hypertension,  Rhythm:regular Rate:Normal     Neuro/Psych    GI/Hepatic hiatal hernia, PUD, GERD  ,  Endo/Other    Renal/GU      Musculoskeletal  (+) Arthritis ,   Abdominal   Peds  Hematology   Anesthesia Other Findings   Reproductive/Obstetrics                             Anesthesia Physical Anesthesia Plan  ASA: 2  Anesthesia Plan: General   Post-op Pain Management:    Induction: Intravenous  PONV Risk Score and Plan: 3 and Ondansetron, Dexamethasone and Treatment may vary due to age or medical condition  Airway Management Planned: Oral ETT  Additional Equipment:   Intra-op Plan:   Post-operative Plan: Extubation in OR  Informed Consent: I have reviewed the patients History and Physical, chart, labs and discussed the procedure including the risks, benefits and alternatives for the proposed anesthesia with the patient or authorized representative who has indicated his/her understanding and acceptance.     Dental advisory given  Plan Discussed with: CRNA, Anesthesiologist and Surgeon  Anesthesia Plan Comments:         Anesthesia Quick Evaluation

## 2021-07-08 NOTE — Transfer of Care (Signed)
Immediate Anesthesia Transfer of Care Note  Patient: Wanda [name]  Procedure(s) Performed: XI ROBOTIC ASSISTED HIATAL HERNIA REPAIR WITH MESH AND FUNDOPLICATION (Abdomen) INSERTION OF MESH (Abdomen)  Patient Location: PACU  Anesthesia Type:General  Level of Consciousness: awake, alert  and oriented  Airway & Oxygen Therapy: Patient Spontanous Breathing and Patient connected to face mask oxygen  Post-op Assessment: Report given to RN, Post -op Vital signs reviewed and stable and Patient moving all extremities  Post vital signs: Reviewed and stable  Last Vitals:  Vitals Value Taken Time  BP 162/84 07/08/21 1239  Temp    Pulse 74 07/08/21 1246  Resp 17 07/08/21 1246  SpO2 100 % 07/08/21 1246  Vitals shown include unvalidated device data.  Last Pain:  Vitals:   07/08/21 0841  TempSrc:   PainSc: 0-No pain         Complications: No notable events documented.

## 2021-07-08 NOTE — Interval H&P Note (Signed)
History and Physical Interval Note:  07/08/2021 9:31 AM  Wanda [name]  has presented today for surgery, with the diagnosis of HIATAL HERNIA.  The various methods of treatment have been discussed with the patient and family. After consideration of risks, benefits and other options for treatment, the patient has consented to  Procedure(s): XI ROBOTIC ASSISTED HIATAL HERNIA REPAIR WITH MESH AND FUNDOPLICATION (N/A) as a surgical intervention.  The patient's history has been reviewed, patient examined, no change in status, stable for surgery.  I have reviewed the patient's chart and labs.  Questions were answered to the patient's satisfaction.     Ralene Ok

## 2021-07-08 NOTE — Anesthesia Procedure Notes (Addendum)
Procedure Name: Intubation Date/Time: 07/08/2021 10:32 AM Performed by: Justin Mend, RN Pre-anesthesia Checklist: Patient identified, Emergency Drugs available, Suction available and Patient being monitored Patient Re-evaluated:Patient Re-evaluated prior to induction Oxygen Delivery Method: Circle System Utilized Preoxygenation: Pre-oxygenation with 100% oxygen Induction Type: IV induction and Cricoid Pressure applied Ventilation: Mask ventilation without difficulty and Oral airway inserted - appropriate to patient size Laryngoscope Size: Mac and 3 Grade View: Grade II Tube type: Oral Tube size: 6.5 mm Number of attempts: 1 Airway Equipment and Method: Stylet and Oral airway Placement Confirmation: ETT inserted through vocal cords under direct vision, positive ETCO2 and breath sounds checked- equal and bilateral Secured at: 22 cm Tube secured with: Tape Dental Injury: Teeth and Oropharynx as per pre-operative assessment

## 2021-07-08 NOTE — Op Note (Signed)
07/08/2021  12:12 PM  PATIENT:  Wanda [name]  87 y.o. female  PRE-OPERATIVE DIAGNOSIS:  HIATAL HERNIA  POST-OPERATIVE DIAGNOSIS:  HIATAL HERNIA  PROCEDURE:  Procedure(s): XI ROBOTIC ASSISTED HIATAL HERNIA REPAIR WITH MESH AND TOUPET FUNDOPLICATION (N/A) INSERTION OF MESH (N/A)  SURGEON:  Surgeon(s) and Role:    * Ralene Ok, MD - Primary  ASSISTANTS: Pryor Curia, RNFA   ANESTHESIA:   local, regional, and general  EBL:  minimal   BLOOD ADMINISTERED:none  DRAINS: none   LOCAL MEDICATIONS USED:  BUPIVICAINE  and OTHER exaprel  SPECIMEN:  No Specimen  DISPOSITION OF SPECIMEN:  N/A  COUNTS:  YES  TOURNIQUET:  * No tourniquets in log *  DICTATION: .Dragon Dictation Findings: Patient had hurt her stomach and her chest cavity.  This was taken down.  Patient underwent toupee fundoplication.  A piece of bio a core mesh was placed to help reinforce the hiatal repair.  Details of the procedure:  The patient was taken back to the operating room and placed in the supine position with bilateral SCDs in place. The patient was prepped and draped in the usual sterile fashion. After appropriate antibiotics were confirmed a timeout was called and all facts were verified.   A Veress needle technique was used to insufflate the abdomen to 15 mm of mercury the paramedian stab incision. Subsequent to this an 8 mm trocar was introduced as was a 8 millimeter camera. At this time the subsequent robotic trochars x3, were then placed adjacent to this trocar approximately 8-10 cm away. Each trocar was inserted under direct visualization, there were total of 4 trochars. A 71m trocar was placed in the midclavicular line.  A 0vicryl was placed to help with closure at the end of the case.The assistant trocar was then placed in the right lower quadrant under direct visualization. The Nathanson retractor was then visualized inserted into the abdomen and the incision just to the left of the  falciform ligament. This was then placed to retract the liver appropriately. At this time the patient was positioned in reverse Trendelenburg.   At this time the robot patient cart was brought to the bedside and placed in good position and the arms were docked to the trochars appropriately. At this time I proceeded to incised the gastrohepatic ligament.  At this time I proceeded to mobilize the stomach inferiorly and visualize the right crus. The peritoneum over the right crus was incised and right crus was identified. I proceeded to dissect this inferiorly until the left crus was seen joining the right crus. Once the right crus was adequately dissected we turned our to the left crus which was dissected away. This required traction of the stomach to the right side. Once this was visualized we then proceeded to circumferentially dissect the esophagus away from the surrounding tissue. The anterior and posterior vagus was seen along the esophagus at the GE junction.  These were both preserved throughout the entire case. At this time the phrenoesophageal fat pad was dissected away from the esophagus. There was a moderate-sized hiatal hernia seen. I mobilized the esophagus cephalad approximately 4-5 cm, clearing away the surrounding tissue. The anterior hernia sac was dissected away from the stomach and esophagus and disposed.  At this time we turned our attention to the greater curvature the stomach and the omentum was mobilized using the robotic vessel sealer. This was taken up to the greater curvature to the hiatus. This mobilized the entire greater curvature to allow mobilization  and the wrap. I then proceeded to bring the greater curvature the stomach posterior to the esophagus, and a shoeshine technique was used to evaluate the mobilization of the greater curvature.   At this time I proceeded to close the hiatus using interrupted 0 Ethibonds x 4.  There was an area on the right side of the hiatus that was not  reapproximated.  Did not want to place another inferior suture so as to not stricture at the hiatus.  A vertical suture was placed in the right side x1.  This was an Ethibond stitch.  The hiatal closure without undue stricture to the esophagus.   A piece of Gore Bio A hiatal mesh was placed over the hiatal closure and sutured to the crus using 0 Ethibonds sutures x 4.  At this time the greater curvature was brought around the esophagus and sutured using 0 silk sutures interrupted fashion approximately 1 cm apart x3 on each side of the esophagus in a Toupet fashion. A left collar stitch was then used to gastropexy the stomach from the wrap to the diaphragm just lateral to the left crus as.  A second collar stitch was placed from the wrap to the right crus.  The wrap lay loose with no strangulation of the esophagus.  At this time the robot was undocked. The liver trocar was removed. At this time insufflation was evacuated. Skin was reapproximated for Monocryl subcuticular fashion. The skin was then dressed with Dermabond. The patient tolerated the procedure well and was taken to the recovery room in stable condition.   PLAN OF CARE: Admit to inpatient   PATIENT DISPOSITION:  PACU - hemodynamically stable.   Delay start of Pharmacological VTE agent (>24hrs) due to surgical blood loss or risk of bleeding: yes

## 2021-07-08 NOTE — Discharge Instructions (Signed)
EATING AFTER YOUR ESOPHAGEAL SURGERY (Stomach Fundoplication, Hiatal Hernia repair, Achalasia surgery, etc)  ######################################################################  EAT Start with a pureed / full liquid diet (see below) Gradually transition to a high fiber diet with a fiber supplement over the next month after discharge.    WALK Walk an hour a day.  Control your pain to do that.    CONTROL PAIN Control pain so that you can walk, sleep, tolerate sneezing/coughing, go up/down stairs.  HAVE A BOWEL MOVEMENT DAILY Keep your bowels regular to avoid problems.  OK to try a laxative to override constipation.  OK to use an antidairrheal to slow down diarrhea.  Call if not better after 2 tries  CALL IF YOU HAVE PROBLEMS/CONCERNS Call if you are still struggling despite following these instructions. Call if you have concerns not answered by these instructions  ######################################################################   After your esophageal surgery, expect some sticking with swallowing over the next 1-2 months.    If food sticks when you eat, it is called "dysphagia".  This is due to swelling around your esophagus at the wrap & hiatal diaphragm repair.  It will gradually ease off over the next few months.  To help you through this temporary phase, we start you out on a pureed (blenderized) diet.  Your first meal in the hospital was thin liquids.  You should have been given a pureed diet by the time you left the hospital.  We ask patients to stay on a pureed diet for the first 2-3 weeks to avoid anything getting "stuck" near your recent surgery.  Don't be alarmed if your ability to swallow doesn't progress according to this plan.  Everyone is different and some diets can advance more or less quickly.    It is often helpful to crush your medications or split them as they can sometimes stick, especially the first week or so.   Some BASIC RULES to follow  are:  Maintain an upright position whenever eating or drinking.  Take small bites - just a teaspoon size bite at a time.  Eat slowly.  It may also help to eat only one food at a time.  Consider nibbling through smaller, more frequent meals & avoid the urge to eat BIG meals  Do not push through feelings of fullness, nausea, or bloatedness  Do not mix solid foods and liquids in the same mouthful  Try not to "wash foods down" with large gulps of liquids.  Avoid carbonated (bubbly/fizzy) drinks.    Avoid foods that make you feel gassy or bloated.  Start with bland foods first.  Wait on trying greasy, fried, or spicy meals until you are tolerating more bland solids well.  Understand that it will be hard to burp and belch at first.  This gradually improves with time.  Expect to be more gassy/flatulent/bloated initially.  Walking will help your body manage it better.  Consider using medications for bloating that contain simethicone such as  Maalox or Gas-X   Consider crushing her medications, especially smaller pills.  The ability to swallow pills should get easier after a few weeks  Eat in a relaxed atmosphere & minimize distractions.  Avoid talking while eating.    Do not use straws.  Following each meal, sit in an upright position (90 degree angle) for 60 to 90 minutes.  Going for a short walk can help as well  If food does stick, don't panic.  Try to relax and let the food pass on its own.    Sipping WARM LIQUID such as strong hot black tea can also help slide it down.   Be gradual in changes & use common sense:  -If you easily tolerating a certain "level" of foods, advance to the next level gradually -If you are having trouble swallowing a particular food, then avoid it.   -If food is sticking when you advance your diet, go back to thinner previous diet (the lower LEVEL) for 1-2 days.  LEVEL 1 = PUREED DIET  Do for the first 2 WEEKS AFTER SURGERY  -Foods in this group are  pureed or blenderized to a smooth, mashed potato-like consistency.  -If necessary, the pureed foods can keep their shape with the addition of a thickening agent.   -Meat should be pureed to a smooth, pasty consistency.  Hot broth or gravy may be added to the pureed meat, approximately 1 oz. of liquid per 3 oz. serving of meat. -CAUTION:  If any foods do not puree into a smooth consistency, swallowing will be more difficult.  (For example, nuts or seeds sometimes do not blend well.)  Hot Foods Cold Foods  Pureed scrambled eggs and cheese Pureed cottage cheese  Baby cereals Thickened juices and nectars  Thinned cooked cereals (no lumps) Thickened milk or eggnog  Pureed French toast or pancakes Ensure  Mashed potatoes Ice cream  Pureed parsley, au gratin, scalloped potatoes, candied sweet potatoes Fruit or Italian ice, sherbet  Pureed buttered or alfredo noodles Plain yogurt  Pureed vegetables (no corn or peas) Instant breakfast  Pureed soups and creamed soups Smooth pudding, mousse, custard  Pureed scalloped apples Whipped gelatin  Gravies Sugar, syrup, honey, jelly  Sauces, cheese, tomato, barbecue, white, creamed Cream  Any baby food Creamer  Alcohol in moderation (not beer or champagne) Margarine  Coffee or tea Mayonnaise   Ketchup, mustard   Apple sauce   SAMPLE MENU:  PUREED DIET Breakfast Lunch Dinner   Orange juice, 1/2 cup  Cream of wheat, 1/2 cup  Pineapple juice, 1/2 cup  Pureed turkey, barley soup, 3/4 cup  Pureed Hawaiian chicken, 3 oz   Scrambled eggs, mashed or blended with cheese, 1/2 cup  Tea or coffee, 1 cup   Whole milk, 1 cup   Non-dairy creamer, 2 Tbsp.  Mashed potatoes, 1/2 cup  Pureed cooled broccoli, 1/2 cup  Apple sauce, 1/2 cup  Coffee or tea  Mashed potatoes, 1/2 cup  Pureed spinach, 1/2 cup  Frozen yogurt, 1/2 cup  Tea or coffee      LEVEL 2 = SOFT DIET  After your first 2 weeks, you can advance to a soft diet.   Keep on this  diet until everything goes down easily.  Hot Foods Cold Foods  White fish Cottage cheese  Stuffed fish Junior baby fruit  Baby food meals Semi thickened juices  Minced soft cooked, scrambled, poached eggs nectars  Souffle & omelets Ripe mashed bananas  Cooked cereals Canned fruit, pineapple sauce, milk  potatoes Milkshake  Buttered or Alfredo noodles Custard  Cooked cooled vegetable Puddings, including tapioca  Sherbet Yogurt  Vegetable soup or alphabet soup Fruit ice, Italian ice  Gravies Whipped gelatin  Sugar, syrup, honey, jelly Junior baby desserts  Sauces:  Cheese, creamed, barbecue, tomato, white Cream  Coffee or tea Margarine   SAMPLE MENU:  LEVEL 2 Breakfast Lunch Dinner   Orange juice, 1/2 cup  Oatmeal, 1/2 cup  Scrambled eggs with cheese, 1/2 cup  Decaffeinated tea, 1 cup  Whole milk, 1 cup    Non-dairy creamer, 2 Tbsp  Pineapple juice, 1/2 cup  Minced beef, 3 oz  Gravy, 2 Tbsp  Mashed potatoes, 1/2 cup  Minced fresh broccoli, 1/2 cup  Applesauce, 1/2 cup  Coffee, 1 cup  Turkey, barley soup, 3/4 cup  Minced Hawaiian chicken, 3 oz  Mashed potatoes, 1/2 cup  Cooked spinach, 1/2 cup  Frozen yogurt, 1/2 cup  Non-dairy creamer, 2 Tbsp      LEVEL 3 = CHOPPED DIET  -After all the foods in level 2 (soft diet) are passing through well you should advance up to more chopped foods.  -It is still important to cut these foods into small pieces and eat slowly.  Hot Foods Cold Foods  Poultry Cottage cheese  Chopped Swedish meatballs Yogurt  Meat salads (ground or flaked meat) Milk  Flaked fish (tuna) Milkshakes  Poached or scrambled eggs Soft, cold, dry cereal  Souffles and omelets Fruit juices or nectars  Cooked cereals Chopped canned fruit  Chopped French toast or pancakes Canned fruit cocktail  Noodles or pasta (no rice) Pudding, mousse, custard  Cooked vegetables (no frozen peas, corn, or mixed vegetables) Green salad  Canned small sweet peas  Ice cream  Creamed soup or vegetable soup Fruit ice, Italian ice  Pureed vegetable soup or alphabet soup Non-dairy creamer  Ground scalloped apples Margarine  Gravies Mayonnaise  Sauces:  Cheese, creamed, barbecue, tomato, white Ketchup  Coffee or tea Mustard   SAMPLE MENU:  LEVEL 3 Breakfast Lunch Dinner   Orange juice, 1/2 cup  Oatmeal, 1/2 cup  Scrambled eggs with cheese, 1/2 cup  Decaffeinated tea, 1 cup  Whole milk, 1 cup  Non-dairy creamer, 2 Tbsp  Ketchup, 1 Tbsp  Margarine, 1 tsp  Salt, 1/4 tsp  Sugar, 2 tsp  Pineapple juice, 1/2 cup  Ground beef, 3 oz  Gravy, 2 Tbsp  Mashed potatoes, 1/2 cup  Cooked spinach, 1/2 cup  Applesauce, 1/2 cup  Decaffeinated coffee  Whole milk  Non-dairy creamer, 2 Tbsp  Margarine, 1 tsp  Salt, 1/4 tsp  Pureed turkey, barley soup, 3/4 cup  Barbecue chicken, 3 oz  Mashed potatoes, 1/2 cup  Ground fresh broccoli, 1/2 cup  Frozen yogurt, 1/2 cup  Decaffeinated tea, 1 cup  Non-dairy creamer, 2 Tbsp  Margarine, 1 tsp  Salt, 1/4 tsp  Sugar, 1 tsp    LEVEL 4:  REGULAR FOODS  -Foods in this group are soft, moist, regularly textured foods.   -This level includes meat and breads, which tend to be the hardest things to swallow.   -Eat very slowly, chew well and continue to avoid carbonated drinks. -most people are at this level in 4-6 weeks  Hot Foods Cold Foods  Baked fish or skinned Soft cheeses - cottage cheese  Souffles and omelets Cream cheese  Eggs Yogurt  Stuffed shells Milk  Spaghetti with meat sauce Milkshakes  Cooked cereal Cold dry cereals (no nuts, dried fruit, coconut)  French toast or pancakes Crackers  Buttered toast Fruit juices or nectars  Noodles or pasta (no rice) Canned fruit  Potatoes (all types) Ripe bananas  Soft, cooked vegetables (no corn, lima, or baked beans) Peeled, ripe, fresh fruit  Creamed soups or vegetable soup Cakes (no nuts, dried fruit, coconut)  Canned chicken  noodle soup Plain doughnuts  Gravies Ice cream  Bacon dressing Pudding, mousse, custard  Sauces:  Cheese, creamed, barbecue, tomato, white Fruit ice, Italian ice, sherbet  Decaffeinated tea or coffee Whipped gelatin  Pork chops Regular gelatin     Canned fruited gelatin molds   Sugar, syrup, honey, jam, jelly   Cream   Non-dairy   Margarine   Oil   Mayonnaise   Ketchup   Mustard   TROUBLESHOOTING IRREGULAR BOWELS  1) Avoid extremes of bowel movements (no bad constipation/diarrhea)  2) Miralax 17gm mixed in 8oz. water or juice-daily. May use BID as needed.  3) Gas-x,Phazyme, etc. as needed for gas & bloating.  4) Soft,bland diet. No spicy,greasy,fried foods.  5) Prilosec over-the-counter as needed  6) May hold gluten/wheat products from diet to see if symptoms improve.  7) May try probiotics (Align, Activa, etc) to help calm the bowels down  7) If symptoms become worse call back immediately.    If you have any questions please call our office at [facility]: [phone].  

## 2021-07-09 ENCOUNTER — Inpatient Hospital Stay (HOSPITAL_COMMUNITY): Payer: Medicare Other

## 2021-07-09 ENCOUNTER — Encounter (HOSPITAL_COMMUNITY): Payer: Self-pay | Admitting: General Surgery

## 2021-07-09 DIAGNOSIS — K449 Diaphragmatic hernia without obstruction or gangrene: Secondary | ICD-10-CM | POA: Diagnosis not present

## 2021-07-09 LAB — BASIC METABOLIC PANEL
Anion gap: 6 (ref 5–15)
BUN: 13 mg/dL (ref 8–23)
CO2: 27 mmol/L (ref 22–32)
Calcium: 8.9 mg/dL (ref 8.9–10.3)
Chloride: 105 mmol/L (ref 98–111)
Creatinine, Ser: 0.81 mg/dL (ref 0.44–1.00)
GFR, Estimated: >60 mL/min (ref >60)
Glucose, Bld: 134 mg/dL — ABNORMAL HIGH (ref 70–99)
Potassium: 4.4 mmol/L (ref 3.5–5.1)
Sodium: 138 mmol/L (ref 135–145)

## 2021-07-09 MED ORDER — DIATRIZOATE MEGLUMINE & SODIUM 66-10 % PO SOLN
120.0000 mL | Freq: Once | ORAL | Status: AC
  Administered 2021-07-09: 120 mL via ORAL
  Filled 2021-07-09: qty 120

## 2021-07-09 MED ORDER — HYDROCODONE-ACETAMINOPHEN 7.5-325 MG/15ML PO SOLN: 15.0000 mL | Freq: Four times a day (QID) | ORAL | 0 refills | Status: AC | PRN

## 2021-07-09 NOTE — Discharge Summary (Signed)
Physician Discharge Summary  Patient ID: Wanda [name] MRN: [id number] DOB/AGE: 19-Dec-1932 87 y.o.  Admit date: 07/08/2021 Discharge date: 07/09/2021  Admission Diagnoses:hiatal hernia  Discharge Diagnoses:  Active Problems:   S/P repair of paraesophageal hernia   Discharged Condition: good  Hospital Course: PT was admitted post op from above surgery.  Pt with no pain issues.  Pt underwent UGI on pod 1 and showed no signs of leak.  She was started on  a liquid diet and tol that well.  She was ambulating well and had good pain control.  She was deemed stable for DC and dc'd home.  Consults: None  Significant Diagnostic Studies: DG esoph with no leak  Treatments: surgery: as above  Discharge Exam: Blood pressure (!) 145/77, pulse 68, temperature 98.2 F (36.8 C), temperature source Oral, resp. rate 16, height '4\' 9"'$  (1.448 m), weight 49 kg, SpO2 96 %. General appearance: alert and cooperative GI: soft, non-tender; bowel sounds normal; no masses,  no organomegaly  Disposition: Discharge disposition: 01-Home or Self Care       Discharge Instructions     DIET - DYS 1   Complete by: As directed    Fluid consistency: Thin   Increase activity slowly   Complete by: As directed       Allergies as of 07/09/2021       Reactions   Sulfa Antibiotics    unknown   Naproxen Sodium Rash, Other (See Comments)   Passed out        Medication List     TAKE these medications    calcium citrate-vitamin D 500-400 MG-UNIT chewable tablet Chew 2 tablets by mouth daily.   ciclopirox 8 % solution Commonly known as: PENLAC Apply 1 application topically at bedtime.   cyanocobalamin 1000 MCG/ML injection Commonly known as: (VITAMIN B-12) Inject 1,000 mcg into the muscle every 30 (thirty) days.   Dialyvite Vitamin D 5000 125 MCG (5000 UT) capsule Generic drug: Cholecalciferol Take 5,000 Units by mouth daily.   FeroSul 325 (65 FE) MG tablet Generic drug: ferrous  sulfate TAKE (1) TABLET BY MOUTH TWICE A DAY AFTER MEALS (BREAKFAST AND SUPPER).   HYDROcodone-acetaminophen 7.5-325 mg/15 ml solution Commonly known as: HYCET Take 15 mLs by mouth 4 (four) times daily as needed for moderate pain.   lisinopril-hydrochlorothiazide 20-12.5 MG tablet Commonly known as: ZESTORETIC Take 1 tablet by mouth daily.   multivitamin tablet Take 2 tablets by mouth daily.   pantoprazole 40 MG tablet Commonly known as: PROTONIX Take 40 mg by mouth 2 (two) times daily.   sucralfate 1 g tablet Commonly known as: CARAFATE Take 1 g by mouth 4 (four) times daily.        Follow-up Information     Ralene Ok, MD. Schedule an appointment as soon as possible for a visit in 2 week(s).   Specialty: General Surgery Why: Post op visit Contact information: [address] [place] [address] [phone]                 Signed: Ralene Ok 07/09/2021, 10:35 AM

## 2021-07-09 NOTE — Anesthesia Postprocedure Evaluation (Signed)
Anesthesia Post Note  Patient: Wanda [name]  Procedure(s) Performed: XI ROBOTIC ASSISTED HIATAL HERNIA REPAIR WITH MESH AND FUNDOPLICATION (Abdomen) INSERTION OF MESH (Abdomen)     Patient location during evaluation: PACU Anesthesia Type: General Level of consciousness: awake and alert Pain management: pain level controlled Vital Signs Assessment: post-procedure vital signs reviewed and stable Respiratory status: spontaneous breathing, nonlabored ventilation, respiratory function stable and patient connected to nasal cannula oxygen Cardiovascular status: blood pressure returned to baseline and stable Postop Assessment: no apparent nausea or vomiting Anesthetic complications: no   No notable events documented.  Last Vitals:  Vitals:   07/09/21 0017 07/09/21 0354  BP: (!) 146/71 (!) 145/77  Pulse: 68   Resp: 15 16  Temp: 36.4 C 36.8 C  SpO2: 98% 96%    Last Pain:  Vitals:   07/09/21 0354  TempSrc: Oral  PainSc:                  [place]

## 2021-07-09 NOTE — Plan of Care (Signed)
RD consulted for nutrition education regarding diet instructions s/p Nissen (pureed diet for 2 weeks post-op). Family at bedside   RD provided "Esophageal Surgery Nutrition Therapy" handout from the [employer] in addition to other nutrition resources addressing diet post Nissen. Reviewed patient's dietary recall. Provided examples on ways to modify foods on pureed diet. Encouraged pt to blend foods using a blender or food processor and strain to obtain desired consistency (no lumps). Also encouraged use of commercial pureed foods and oral nutritional supplements such as Ensure to provide adequate calories and protein. Pt likes Ice Cream and Milkshakes; encouraged pt to utilize protein powder in milkshakes and smoothies. Discussed how to modify recipes to add calories and protein. Discouraged carbonated beverages and the use of straws; encouraged 5-6 smaller meals per day.    RD discussed why it is important for patient to adhere to diet recommendations and discussed plan to continue this diet until MD followup when further instruction will be provided.  Teach back method used.   Expect good compliance.  Pt with order for discharge today. Pt with CL diet ordered but has not had anything to eat yet. Pt to demonstrate tolerance of CL prior to discharge  Kerman Passey MS, RDN, LDN, [place] Registered Dietitian III Clinical Nutrition RD Pager and On-Call Pager Number Located in [place]

## 2021-07-09 NOTE — Progress Notes (Signed)
Patient discharged to home via wheelchair with husband and all belongings

## 2021-07-09 NOTE — Care Management CC44 (Signed)
Condition Code 44 Documentation Completed  Patient Details  Name: LOUVENIA DOSS MRN: [id number] Date of Birth: 1933/03/18   Condition Code 44 given:  Yes Patient signature on Condition Code 44 notice:  Yes Documentation of 2 MD's agreement:  Yes Code 44 added to claim:  Yes    Bethena Roys, RN 07/09/2021, 12:43 PM

## 2021-07-09 NOTE — Progress Notes (Signed)
Discharge instructions given to patient. Pt verbalized understanding of all teahing. CC44 form given to pt upon discharge. IV removed. Pt verbalized understanding of following liquid/puree diet at home.

## 2021-07-09 NOTE — Care Management Obs Status (Signed)
[place] NOTIFICATION   Patient Details  Name: Wanda [name] MRN: [id number] Date of Birth: 04-Jun-1933   Medicare Observation Status Notification Given:  Yes    Bethena Roys, RN 07/09/2021, 12:43 PM

## 2021-07-10 ENCOUNTER — Telehealth: Payer: Self-pay | Admitting: Internal Medicine

## 2021-07-10 NOTE — Telephone Encounter (Signed)
[phone] Abbe [place], patient son, has a few questions about patients medication, she just had a procedure at [facility] and needs to know what she should start back taking after surgery. I told him that he may need to go over that with the physician that performed her procedure. Please give him a call

## 2021-07-10 NOTE — Telephone Encounter (Signed)
Tried to return call, however got a message that the number was unavailable at this time.

## 2021-07-15 ENCOUNTER — Ambulatory Visit: Payer: Medicare Other | Admitting: Gastroenterology

## 2021-09-09 ENCOUNTER — Other Ambulatory Visit: Payer: Self-pay

## 2021-09-09 ENCOUNTER — Ambulatory Visit (INDEPENDENT_AMBULATORY_CARE_PROVIDER_SITE_OTHER): Payer: Medicare Other | Admitting: Internal Medicine

## 2021-09-09 ENCOUNTER — Encounter: Payer: Self-pay | Admitting: Internal Medicine

## 2021-09-09 VITALS — BP 149/84 | HR 80 | Temp 97.7°F | Ht <= 58 in | Wt 103.0 lb

## 2021-09-09 DIAGNOSIS — K219 Gastro-esophageal reflux disease without esophagitis: Secondary | ICD-10-CM | POA: Diagnosis not present

## 2021-09-09 DIAGNOSIS — D649 Anemia, unspecified: Secondary | ICD-10-CM

## 2021-09-09 DIAGNOSIS — K921 Melena: Secondary | ICD-10-CM

## 2021-09-09 DIAGNOSIS — K922 Gastrointestinal hemorrhage, unspecified: Secondary | ICD-10-CM

## 2021-09-09 NOTE — Progress Notes (Signed)
Primary Care Physician:  Renee Rival, NP Primary Gastroenterologist:  Dr. Gala Romney  Pre-Procedure History & Physical: HPI:  Wanda [name] is a 87 y.o. female here for follow-up.  Large hiatal hernia with Lysbeth Galas lesions producing intermittent GI bleeding.  Since I last saw this nice lady, she saw Dr. Rosendo Gros with [facility] surgery.  She underwent a robotic assisted repair hiatal hernia and toupee antireflux procedure.  She spent 24 hours in the hospital.  She is off her Carafate and PPI.  She is doing great.  She is accompanied by her son today.  Really not having any reflux symptoms.  Some vague sensation that food slows down as it passes behind her Xyphoid process.  Has not had any symptoms consistent with a food impaction.  No melena or rectal bleeding.  Past Medical History:  Diagnosis Date   Anemia    Cystocele    Diverticulosis 10/20/2010   Left-sided on colonoscopy by Dr. Gala Romney 10/20/10   Gastric polyps 10/02/2013   Gastritis 12/08/2010   EGD by Dr. 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 Aline Brochure currently   Sleep apnea    STOP BANG SCORE 4   Urge incontinence    Vitamin D deficiency     Past Surgical History:  Procedure Laterality Date   ABDOMINAL HYSTERECTOMY     ANTERIOR AND POSTERIOR REPAIR  05/17/2012   Procedure: ANTERIOR (CYSTOCELE) AND POSTERIOR REPAIR (RECTOCELE);  Surgeon: Jonnie Kind, MD;  [facility]: [facility];  Service: Gynecology;  Laterality: N/A;   BIOPSY  11/22/2018   Procedure: BIOPSY;  Surgeon: Daneil Dolin, MD;  [facility];  Service: Endoscopy;;   BIOPSY  02/12/2020   Procedure: BIOPSY;  Surgeon: Daneil Dolin, MD;  [facility];   Service: Endoscopy;;  gastric biopsy   CATARACT EXTRACTION, BILATERAL     COLONOSCOPY  02/29/2012   Dr. Gala Romney: colonic diverticulosis, minimal internal hemorrhoids, benign polyp   ESOPHAGOGASTRODUODENOSCOPY  02/29/2012   Dr. Gala Romney: atonic esophagus, moderate-sized hiatal hernia, fundal gland polyps   ESOPHAGOGASTRODUODENOSCOPY N/A 10/02/2013   Dr. Gala Romney- multiple Wilhemena Durie gastric polyps- not manipulated   ESOPHAGOGASTRODUODENOSCOPY N/A 01/14/2015   Dr. Gala Romney: Large hiatal hernia, otherwise normal EGD   ESOPHAGOGASTRODUODENOSCOPY (EGD) WITH PROPOFOL N/A 11/22/2018   Dr. Gala Romney: normal esophagus, large hiatal hernia, erosive gastropathy s/p biopsy, normal duodenum. Reactive gastropathy   ESOPHAGOGASTRODUODENOSCOPY (EGD) WITH PROPOFOL N/A 02/12/2020   normal esophagus, large hiatal hernia, [name] lesions, benign gastric biopsy   ESOPHAGOGASTRODUODENOSCOPY (EGD) WITH PROPOFOL N/A 12/26/2020   Procedure: ESOPHAGOGASTRODUODENOSCOPY (EGD) WITH PROPOFOL;  Surgeon: Daneil Dolin, MD;  [facility];  Service: Endoscopy;  Laterality: N/A;   ESOPHAGOGASTRODUODENOSCOPY (EGD) WITH PROPOFOL N/A 03/23/2021   Procedure: ESOPHAGOGASTRODUODENOSCOPY (EGD) WITH PROPOFOL;  Surgeon: Harvel Quale, MD;  [facility];  Service: Gastroenterology;  Laterality: N/A;   GIVENS CAPSULE STUDY  02/29/2012   No explanation for IDA. Possible extrinsic compression but negative CT.    GIVENS CAPSULE STUDY N/A 12/26/2020   Procedure: GIVENS CAPSULE STUDY;  Surgeon: Daneil Dolin, MD;  [facility];  Service: Endoscopy;  Laterality: N/A;   INSERTION OF MESH N/A  07/08/2021   Procedure: INSERTION OF MESH;  Surgeon: Ralene Ok, MD;  Location: [facility];  Service: General;  Laterality: N/A;   SALPINGOOPHORECTOMY  05/17/2012   Procedure: SALPINGO OOPHERECTOMY;  Surgeon: Jonnie Kind, MD;  [facility];  Service: Gynecology;  Laterality: Bilateral;   VAGINAL HYSTERECTOMY   05/17/2012   Procedure: HYSTERECTOMY VAGINAL;  Surgeon: Jonnie Kind, MD;  [facility];  Service: Gynecology;  Laterality: N/A;   XI ROBOTIC ASSISTED HIATAL HERNIA REPAIR N/A 07/08/2021   Procedure: XI ROBOTIC ASSISTED HIATAL HERNIA REPAIR WITH MESH AND FUNDOPLICATION;  Surgeon: Ralene Ok, MD;  Location: [facility];  Service: General;  Laterality: N/A;    Prior to Admission medications   Medication Sig Start Date End Date Taking? Authorizing Provider  ciclopirox (PENLAC) 8 % solution Apply 1 application topically at bedtime. 12/20/20  Yes [provider]  cyanocobalamin (,VITAMIN B-12,) 1000 MCG/ML injection Inject 1,000 mcg into the muscle every 30 (thirty) days. 01/10/21  Yes [provider]  FEROSUL 325 (65 Fe) MG tablet TAKE (1) TABLET BY MOUTH TWICE A DAY AFTER MEALS (BREAKFAST AND SUPPER). 05/12/21  Yes Mahala Menghini, PA-C  HYDROcodone-acetaminophen (HYCET) 7.5-325 mg/15 ml solution Take 15 mLs by mouth 4 (four) times daily as needed for moderate pain. 07/09/21 07/09/22 Yes Ralene Ok, MD  lisinopril-hydrochlorothiazide (ZESTORETIC) 20-12.5 MG tablet Take 1 tablet by mouth daily. 02/11/21  Yes [provider]  Multiple Vitamin (MULTIVITAMIN) tablet Take 2 tablets by mouth daily.   Yes [provider]  calcium citrate-vitamin D 500-400 MG-UNIT chewable tablet Chew 2 tablets by mouth daily. Patient not taking: Reported on 09/09/2021    [provider]  Cholecalciferol (DIALYVITE VITAMIN D 5000) 125 MCG (5000 UT) capsule Take 5,000 Units by mouth daily. Patient not taking: Reported on 09/09/2021    [provider]  pantoprazole (PROTONIX) 40 MG tablet Take 40 mg by mouth 2 (two) times daily. Patient not taking: Reported on 09/09/2021 04/18/21   [provider]  sucralfate (CARAFATE) 1 g tablet Take 1 g by mouth 4 (four) times daily. Patient not taking: Reported on 09/09/2021    [provider]    Allergies as of  09/09/2021 - Review Complete 09/09/2021  Allergen Reaction Noted   Sulfa antibiotics  06/23/2016   Naproxen sodium Rash and Other (See Comments)     Family History  Problem Relation Age of Onset   Hypertension Mother    Lymphoma Brother    Anesthesia problems Neg Hx    Hypotension Neg Hx    Malignant hyperthermia Neg Hx    Pseudochol deficiency Neg Hx    Colon cancer Neg Hx     Social History   Socioeconomic History   Marital status: Widowed    Spouse name: Not on file   Number of children: Not on file   Years of education: Not on file   Highest education level: Not on file  Occupational History   Occupation: Retired    Fish farm manager: RETIRED  Tobacco Use   Smoking status: Never   Smokeless tobacco: Never   Tobacco comments:    Never smoked  Vaping Use   Vaping Use: Never used  Substance and Sexual Activity   Alcohol use: No    Alcohol/week: 0.0 standard drinks   Drug use: No   Sexual activity: Not Currently    Birth control/protection: Surgical  Other Topics Concern   Not on file  Social History Narrative   Not on file  Social Determinants of Health   Financial Resource Strain: Not on file  Food Insecurity: Not on file  Transportation Needs: Not on file  Physical Activity: Not on file  Stress: Not on file  Social Connections: Not on file  Intimate Partner Violence: Not on file    Review of Systems: See HPI, otherwise negative ROS  Physical Exam: BP (!) 149/84   Pulse 80   Temp 97.7 F (36.5 C)   Ht 4\' 9"  (1.448 m)   Wt 103 lb (46.7 kg)   BMI 22.29 kg/m  General:   Alert,  pleasant and cooperative in NAD; accompanied by her son. Lungs:  Clear throughout to auscultation.   No wheezes, crackles, or rhonchi. No acute distress. Heart:  Regular rate and rhythm; no murmurs, clicks, rubs,  or gallops. Abdomen: Non-distended, normal bowel sounds.  Soft and nontender without appreciable mass or hepatosplenomegaly.  Surgical sites  well-healed   Impression/Plan: 87 year old lady with recurrent GI bleeding secondary to [facility] lesions in the setting of a large hiatal hernia.  Apparently, may have had a mixed paraesophageal component.  At any rate, she appears to have had an excellent result from hernia repair and toupee antireflux procedure.  She made it through surgery without any difficulties. Clinically, she is doing well off of Carafate and acid suppression therapy. Hopefully, her anemia is resolving.  Recommendations:  Agree with not taking any more medication for reflux at this time  We will check a CBC.  Hopefully,  anemia is resolving.  Office visit with [place] in 6 months  Notice: This dictation was prepared with Dragon dictation along with smaller phrase technology. Any transcriptional errors that result from this process are unintentional and may not be corrected upon review.

## 2021-09-09 NOTE — Patient Instructions (Signed)
It was good seeing you again today!  You appear to have had a very good result from your surgery.  Agree with not taking any more medication for reflux at this time  We will check a CBC.  Hopefully, your anemia is resolving.  Office visit with [place] in 6 months

## 2021-09-09 NOTE — Progress Notes (Signed)
error 

## 2021-09-11 LAB — CBC WITH DIFFERENTIAL/PLATELET
Absolute Monocytes: 585 cells/uL (ref 200–950)
Basophils Absolute: 62 cells/uL (ref 0–200)
Basophils Relative: 0.8 %
Eosinophils Absolute: 400 cells/uL (ref 15–500)
Eosinophils Relative: 5.2 %
HCT: 41.1 % (ref 35.0–45.0)
Hemoglobin: 13.7 g/dL (ref 11.7–15.5)
Lymphs Abs: 1956 cells/uL (ref 850–3900)
MCH: 32 pg (ref 27.0–33.0)
MCHC: 33.3 g/dL (ref 32.0–36.0)
MCV: 96 fL (ref 80.0–100.0)
MPV: 12.1 fL (ref 7.5–12.5)
Monocytes Relative: 7.6 %
Neutro Abs: 4697 cells/uL (ref 1500–7800)
Neutrophils Relative %: 61 %
Platelets: 304 10*3/uL (ref 140–400)
RBC: 4.28 10*6/uL (ref 3.80–5.10)
RDW: 13 % (ref 11.0–15.0)
Total Lymphocyte: 25.4 %
WBC: 7.7 10*3/uL (ref 3.8–10.8)

## 2021-09-15 ENCOUNTER — Encounter (HOSPITAL_COMMUNITY): Payer: Self-pay | Admitting: *Deleted

## 2021-09-16 ENCOUNTER — Other Ambulatory Visit (HOSPITAL_COMMUNITY): Payer: Self-pay | Admitting: *Deleted

## 2021-09-16 DIAGNOSIS — D500 Iron deficiency anemia secondary to blood loss (chronic): Secondary | ICD-10-CM

## 2021-09-16 DIAGNOSIS — E538 Deficiency of other specified B group vitamins: Secondary | ICD-10-CM

## 2021-10-07 ENCOUNTER — Encounter: Payer: Self-pay | Admitting: Physician Assistant

## 2021-10-14 NOTE — Progress Notes (Signed)
Virtual Visit via Telephone Note [facility]  I connected with Wanda [name]  on  10/15/21 at 11:14 AM by telephone and verified that I am speaking with the correct person using two identifiers.  Location: Patient: Home Provider: [facility]   I discussed the limitations, risks, security and privacy concerns of performing an evaluation and management service by telephone and the availability of in person appointments. I also discussed with the patient that there may be a patient responsible charge related to this service. The patient expressed understanding and agreed to proceed.   HISTORY OF PRESENT ILLNESS: [name] is contacted today for follow-up of her iron deficiency anemia (secondary to chronic intermittent GI bleeding) and B12 deficiency.  She was last contacted by Tarri Abernethy PA-C on 06/13/2021 for telephone visit.  Her last IV iron infusion was Feraheme on 07/07/2021.  At today's visit, she reports feeling very well.  She had surgical repair of paraesophageal hiatal hernia on 07/09/2021, and has been feeling much better since that time.  She hopes that her hernia repair has resolved her bleeding ulcers and that she will not have any further issues in the future.  She reports that her energy is about 70%, which is improved from previous.  She has not noted any melena, hematemesis, hematochezia, or epistaxis.  She denies any chest pain, dyspnea on exertion, pica, lightheadedness, syncope, restless leg symptoms. No B symptoms such as fever, chills, night sweats, unintentional weight loss.  She reports that her energy level is 70%, with 100% appetite.  She is eating well to maintain a stable weight at this time.     OBSERVATIONS/OBJECTIVE: Review of Systems  Constitutional:  Negative for chills, diaphoresis, fever, malaise/fatigue and weight loss.  Respiratory:  Negative for cough and shortness of breath.   Cardiovascular:  Negative  for chest pain and palpitations.  Gastrointestinal:  Negative for abdominal pain, blood in stool, melena, nausea and vomiting.  Genitourinary:        Urinary incontinence  Neurological:  Negative for dizziness and headaches.    PHYSICAL EXAM (per limitations of virtual telephone visit): The patient is alert and oriented x 3, exhibiting adequate mentation, good mood, and ability to speak in full sentences and execute sound judgement.   ASSESSMENT & PLAN: 1.  Iron deficiency anemia secondary to chronic GI blood loss - Patient has had intermittent GI bleeding over the past several years, about once per year.  Has had multiple units of PRBCs, as well as intermittent IV iron. - Patient had EGD in 2016 by Dr. Gala Romney with a large hiatal hernia, otherwise normal.  Previously noted multiple [name] lesions in 2014.  Capsule study in 2013 without explanation for IDA.  Last colonoscopy in 2013 with colonic diverticulosis. - Patient was hospitalized from 03/22/2021 through 03/23/2021 due to an episode of melena.  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 - She had surgical repair of paraesophageal hiatal hernia on 07/09/2021, and has been feeling much better since that time.  Hopefully this has resolved her bleeding ulcers. - Last iron infusion was with Feraheme on 07/07/2021 - Most recent labs (obtained 10/07/2021 at [place] and [place]): Hgb 12.4, ferritin 440, iron saturation 45% - PLAN: Patient can stop iron pill, since her iron levels look great and her bleeding ulcers have hopefully resolved following her hiatal hernia surgery. - We will recheck CBC and  iron panel in 4 months, followed by phone visit.  2.  Vitamin B12 deficiency: - Most recent B12 was 488 (10/07/2021) - PLAN: Continue monthly B12 injections from her primary care provider     Follow Up Instructions: - Labs in 4 months  ([facility] in [place]) - Phone visit after labs    I discussed the assessment and treatment plan with the patient. The patient was provided an opportunity to ask questions and all were answered. The patient agreed with the plan and demonstrated an understanding of the instructions.   The patient was advised to call back or seek an in-person evaluation if the symptoms worsen or if the condition fails to improve as anticipated.  I provided 14 minutes of non-face-to-face time during this encounter.   Harriett Rush, PA-C 10/15/2021 11:37 AM

## 2021-10-15 ENCOUNTER — Other Ambulatory Visit: Payer: Self-pay

## 2021-10-15 ENCOUNTER — Inpatient Hospital Stay (HOSPITAL_COMMUNITY): Payer: Medicare Other | Attending: Physician Assistant | Admitting: Physician Assistant

## 2021-10-15 ENCOUNTER — Ambulatory Visit (HOSPITAL_COMMUNITY): Payer: Medicare Other | Admitting: Physician Assistant

## 2021-10-15 DIAGNOSIS — D500 Iron deficiency anemia secondary to blood loss (chronic): Secondary | ICD-10-CM | POA: Diagnosis not present

## 2022-02-11 ENCOUNTER — Other Ambulatory Visit (HOSPITAL_COMMUNITY): Payer: Self-pay | Admitting: *Deleted

## 2022-02-11 ENCOUNTER — Telehealth (HOSPITAL_COMMUNITY): Payer: Self-pay | Admitting: *Deleted

## 2022-02-11 DIAGNOSIS — D500 Iron deficiency anemia secondary to blood loss (chronic): Secondary | ICD-10-CM

## 2022-02-11 NOTE — Telephone Encounter (Signed)
Spoke with patient and she had a telephone visit scheduled for tomorrow, however has not had labs done since November.  Appointment will be rescheduled and lab orders faxed to [facility] in [place].  Patient states she will have done this week. ?

## 2022-02-12 ENCOUNTER — Telehealth (HOSPITAL_COMMUNITY): Payer: Medicare Other | Admitting: Physician Assistant

## 2022-02-18 ENCOUNTER — Other Ambulatory Visit: Payer: Self-pay | Admitting: Nurse Practitioner

## 2022-02-18 ENCOUNTER — Other Ambulatory Visit (HOSPITAL_COMMUNITY): Payer: Self-pay | Admitting: Nurse Practitioner

## 2022-02-18 DIAGNOSIS — R0989 Other specified symptoms and signs involving the circulatory and respiratory systems: Secondary | ICD-10-CM

## 2022-02-18 NOTE — Progress Notes (Signed)
? ?Virtual Visit via Telephone Note ?[place] ? ?I connected with Wanda [name]  on 02/19/22 at  3:30 PM by telephone and verified that I am speaking with the correct person using two identifiers. ? ?Location: ?Patient: Home ?Provider: [place] ?  ?I discussed the limitations, risks, security and privacy concerns of performing an evaluation and management service by telephone and the availability of in person appointments. I also discussed with the patient that there may be a patient responsible charge related to this service. The patient expressed understanding and agreed to proceed. ? ? ?HISTORY OF PRESENT ILLNESS: ?Wanda [name] is contacted today for follow-up of her iron deficiency anemia (secondary to chronic intermittent GI bleeding) and B12 deficiency.  She was last contacted by Tarri Abernethy PA-C on 10/15/2021 for telephone visit.  Her last IV iron infusion was Feraheme on 07/07/2021. ?  ?At today's visit, she continues to be doing well following her paraesophageal hernia repair from several months ago.  She has not been taking the iron pill since her last visit in November 2022. ?She reports that her energy is about  70%, which is improved from previous. ?She has not noted any melena, hematemesis, hematochezia, or epistaxis. ? She denies any chest pain, dyspnea on exertion, pica, lightheadedness, syncope, restless leg symptoms.  No B symptoms such as fever, chills, night sweats, unintentional weight loss. ? ?She reports that her energy level is 70%, with 100% appetite.  She is eating well to maintain a stable weight at this time. ? ?  ?OBSERVATIONS/OBJECTIVE: ?Review of Systems  ?Constitutional:  Negative for chills, diaphoresis, fever, malaise/fatigue and weight loss.  ?Respiratory:  Negative for cough and shortness of breath.   ?Cardiovascular:  Negative for chest pain and palpitations.  ?Gastrointestinal:  Negative for abdominal pain, blood in stool,  melena, nausea and vomiting.  ?Neurological:  Negative for dizziness and headaches.   ? ?PHYSICAL EXAM (per limitations of virtual telephone visit): The patient is alert and oriented x 3, exhibiting adequate mentation, good mood, and ability to speak in full sentences and execute sound judgement. ? ? ?ASSESSMENT & PLAN: ?1.  Iron deficiency anemia secondary to chronic GI blood loss ?- Patient has had intermittent GI bleeding over the past several years, about once per year.  Has had multiple units of PRBCs, as well as intermittent IV iron. ?- Patient had EGD in 2016 by Dr. Gala Romney with a large hiatal hernia, otherwise normal.  Previously noted multiple [name] lesions in 2014.  Capsule study in 2013 without explanation for IDA.  Last colonoscopy in 2013 with colonic diverticulosis. ?- Patient was hospitalized from 03/22/2021 through 03/23/2021 due to an episode of melena.  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 ?- She had surgical repair of paraesophageal hiatal hernia on 07/09/2021, and has been feeling much better since that time.  Hopefully this has resolved her bleeding ulcers. ?- Last iron infusion was with Feraheme on 07/07/2021 ?- She has been able to stop taking iron pill because her levels have been maintaining so well  ?- Most recent labs (obtained 02/12/2022 at [facility] and [place]): Hgb 13.6, ferritin 352, iron saturation 40% ?- PLAN: Patient is doing much better after her hiatal hernia surgery, hoping that her chronic GI bleeding has now resolved. ?- At this time, we will schedule her for repeat labs and follow-up visit in 1 year, but she is  aware that she can schedule an earlier appointment if needed. ?  ?2.  Vitamin B12 deficiency: ?- Most recent B12 was 488 (10/07/2021) ?- PLAN: Continue monthly B12 injections from her primary care provider ? ? ?  ?I discussed the assessment and treatment  plan with the patient. The patient was provided an opportunity to ask questions and all were answered. The patient agreed with the plan and demonstrated an understanding of the instructions. ?  ?The patient was advised to call back or seek an in-person evaluation if the symptoms worsen or if the condition fails to improve as anticipated. ? ?I provided 15 minutes of non-face-to-face time during this encounter. ? ? ?Harriett Rush, PA-C ?02/19/22 6:45 PM  ?

## 2022-02-19 ENCOUNTER — Inpatient Hospital Stay (HOSPITAL_COMMUNITY): Payer: Medicare Other | Attending: Physician Assistant | Admitting: Physician Assistant

## 2022-02-19 DIAGNOSIS — D500 Iron deficiency anemia secondary to blood loss (chronic): Secondary | ICD-10-CM | POA: Diagnosis not present

## 2022-02-19 DIAGNOSIS — K922 Gastrointestinal hemorrhage, unspecified: Secondary | ICD-10-CM

## 2022-02-26 ENCOUNTER — Ambulatory Visit (HOSPITAL_COMMUNITY)
Admission: RE | Admit: 2022-02-26 | Discharge: 2022-02-26 | Disposition: A | Discharge status: Home / Self Care | Payer: Medicare Other | Source: Ambulatory Visit | Attending: Nurse Practitioner | Admitting: Nurse Practitioner

## 2022-02-26 DIAGNOSIS — R0989 Other specified symptoms and signs involving the circulatory and respiratory systems: Secondary | ICD-10-CM | POA: Insufficient documentation

## 2022-03-02 ENCOUNTER — Encounter: Payer: Self-pay | Admitting: Internal Medicine

## 2022-03-19 ENCOUNTER — Encounter (HOSPITAL_COMMUNITY): Payer: Self-pay

## 2022-03-19 ENCOUNTER — Emergency Department (HOSPITAL_COMMUNITY): Payer: Medicare Other

## 2022-03-19 ENCOUNTER — Other Ambulatory Visit: Payer: Self-pay

## 2022-03-19 ENCOUNTER — Emergency Department (HOSPITAL_COMMUNITY)
Admission: EM | Admit: 2022-03-19 | Discharge: 2022-03-19 | Disposition: A | Discharge status: Home / Self Care | Payer: Medicare Other | Attending: Emergency Medicine | Admitting: Emergency Medicine

## 2022-03-19 DIAGNOSIS — Z79899 Other long term (current) drug therapy: Secondary | ICD-10-CM | POA: Insufficient documentation

## 2022-03-19 DIAGNOSIS — I10 Essential (primary) hypertension: Secondary | ICD-10-CM | POA: Diagnosis not present

## 2022-03-19 DIAGNOSIS — S82122A Displaced fracture of lateral condyle of left tibia, initial encounter for closed fracture: Secondary | ICD-10-CM | POA: Insufficient documentation

## 2022-03-19 DIAGNOSIS — W08XXXA Fall from other furniture, initial encounter: Secondary | ICD-10-CM | POA: Insufficient documentation

## 2022-03-19 DIAGNOSIS — S8992XA Unspecified injury of left lower leg, initial encounter: Secondary | ICD-10-CM | POA: Diagnosis present

## 2022-03-19 DIAGNOSIS — S82142A Displaced bicondylar fracture of left tibia, initial encounter for closed fracture: Secondary | ICD-10-CM

## 2022-03-19 MED ORDER — OXYCODONE-ACETAMINOPHEN 5-325 MG PO TABS: 1.0000 | ORAL_TABLET | Freq: Four times a day (QID) | ORAL | 0 refills | Status: DC | PRN

## 2022-03-19 MED ORDER — OXYCODONE-ACETAMINOPHEN 5-325 MG PO TABS
1.0000 | ORAL_TABLET | Freq: Once | ORAL | Status: AC
  Administered 2022-03-19: 1 via ORAL
  Filled 2022-03-19: qty 1

## 2022-03-19 NOTE — ED Provider Notes (Signed)
?[place] ?Provider Note ? ? ?CSN: [id number] ?Arrival date & time: 03/19/22  1945 ? ?  ? ?History ? ?Chief Complaint  ?Patient presents with  ? Knee Pain  ? ? ?Wanda [name] is a 88 y.o. female. ? ? ?Knee Pain ?Associated symptoms: no back pain and no neck pain   ? ?This patient is an 88 year old female, she is not anticoagulated, she was at home in her usual state of health standing on a small stool trying to reach the upper cabinet when she fell off of the stool, she is unsure how she landed but has had pain in the left knee since with associated swelling.  Denies any other injuries other than a small contusion to her right elbow.  She denies a head injury, denies nausea or vomiting, denies difficulty breathing, she was unable to get up and walk because of the pain, she is accompanied by family member. ? ?Home Medications ?Prior to Admission medications   ?Medication Sig Start Date End Date Taking? Authorizing Provider  ?oxyCODONE-acetaminophen (PERCOCET) 5-325 MG tablet Take 1 tablet by mouth every 6 (six) hours as needed. 03/19/22  Yes Noemi Chapel, MD  ?calcium citrate-vitamin D 500-400 MG-UNIT chewable tablet Chew 2 tablets by mouth daily.    [provider]  ?Cholecalciferol (DIALYVITE VITAMIN D 5000) 125 MCG (5000 UT) capsule Take 5,000 Units by mouth daily.    [provider]  ?ciclopirox (PENLAC) 8 % solution Apply 1 application topically at bedtime. 12/20/20   [provider]  ?cyanocobalamin (,VITAMIN B-12,) 1000 MCG/ML injection Inject 1,000 mcg into the muscle every 30 (thirty) days. 01/10/21   [provider]  ?FEROSUL 325 (65 Fe) MG tablet TAKE (1) TABLET BY MOUTH TWICE A DAY AFTER MEALS (BREAKFAST AND SUPPER). 05/12/21   Mahala Menghini, PA-C  ?HYDROcodone-acetaminophen (HYCET) 7.5-325 mg/15 ml solution Take 15 mLs by mouth 4 (four) times daily as needed for moderate pain. 07/09/21 07/09/22  Ralene Ok, MD  ?lisinopril-hydrochlorothiazide  (ZESTORETIC) 20-12.5 MG tablet Take 1 tablet by mouth daily. 02/11/21   [provider]  ?Multiple Vitamin (MULTIVITAMIN) tablet Take 2 tablets by mouth daily.    [provider]  ?pantoprazole (PROTONIX) 40 MG tablet Take 40 mg by mouth 2 (two) times daily. 04/18/21   [provider]  ?sucralfate (CARAFATE) 1 g tablet Take 1 g by mouth 4 (four) times daily.    [provider]  ?   ? ?Allergies    ?Sulfa antibiotics and Naproxen sodium   ? ?Review of Systems   ?Review of Systems  ?Musculoskeletal:  Positive for joint swelling. Negative for back pain and neck pain.  ?Skin:  Positive for color change. Negative for wound.  ? ?Physical Exam ?Updated Vital Signs ?BP (!) 184/84 (BP Location: Right Arm)   Pulse 67   Temp 98.6 ?F (37 ?C) (Oral)   Resp 17   Ht 1.422 m ('4\' 8"'$ )   Wt 53.5 kg   SpO2 98%   BMI 26.46 kg/m?  ?Physical Exam ?Vitals and nursing note reviewed.  ?Constitutional:   ?   Appearance: She is well-developed. She is not diaphoretic.  ?HENT:  ?   Head: Normocephalic and atraumatic.  ?Eyes:  ?   General:     ?   Right eye: No discharge.     ?   Left eye: No discharge.  ?   Conjunctiva/sclera: Conjunctivae normal.  ?Cardiovascular:  ?   Rate and Rhythm: Normal rate and regular  rhythm.  ?   Heart sounds: No murmur heard. ?Pulmonary:  ?   Effort: Pulmonary effort is normal. No respiratory distress.  ?Musculoskeletal:  ?   Comments: Joint effusion to the L knee -decreased range of motion, tenderness with straight leg raise but she is able to straight leg raise.  ?Skin: ?   General: Skin is warm and dry.  ?   Findings: No erythema or rash.  ?Neurological:  ?   General: No focal deficit present.  ?   Mental Status: She is alert.  ?   Coordination: Coordination normal.  ? ? ?ED Results / Procedures / Treatments   ?Labs ?(all labs ordered are listed, but only abnormal results are displayed) ?Labs Reviewed - No data to display ? ?EKG ?None ? ?Radiology ?CT Knee Left Wo  Contrast ? ?Result Date: 03/19/2022 ?CLINICAL DATA:  Lateral tibial plateau fracture on plain films. EXAM: CT OF THE LEFT KNEE WITHOUT CONTRAST TECHNIQUE: Multidetector CT imaging of the left knee was performed according to the standard protocol. Multiplanar CT image reconstructions were also generated. RADIATION DOSE REDUCTION: This exam was performed according to the departmental dose-optimization program which includes automated exposure control, adjustment of the mA and/or kV according to patient size and/or use of iterative reconstruction technique. COMPARISON:  Left knee series earlier today, left knee series 06/23/2018. No prior cross-sectional imaging. FINDINGS: Bones/Joint/Cartilage Generalized osteopenia. There is acute comminuted fracture extending across the lateral tibial plateau, mid to posterior aspect with fragments depressed up to 4 mm from the plane of the articular surface. No fracture line is seen extending through the proximal tibiofibular joint or through the underlying outer metaphyseal cortex. There are no further displaced fractures including in the distal femur, patella, remaining proximal tibia, and proximal fibula. There is a sizable suprapatellar lipohemarthrosis, with scattered calcifications in the fluid. Diffuse meniscal chondrocalcinosis is noted as well as scattered articular chondral calcifications including in the proximal tibiofibular joint but sparing the patellofemoral cartilage. There is patellofemoral and femorotibial articular cartilage thinning and mild tricompartmental joint narrowing and osteophytosis. No popliteal cyst is seen. Ligaments Suboptimally assessed by CT, but there are ligamentous calcifications noted including in the proximal MCL and LCL, and throughout the ACL and the PCL. Muscles and Tendons There is normal muscle bulk in the imaged volume. Area tendons are not well seen with this technique but are grossly intact including the quadriceps and patellar tendons  with patellar enthesopathy at the quadriceps tendon insertion. Soft tissues There is mild edema in the anterior soft tissues. There are calcifications in the distal femoral, popliteal and popliteal trifurcation arteries. IMPRESSION: 1. Comminuted depressed fracture of the mid to posterior lateral tibial plateau, with fragments depressed up to 4 mm from the plane of the articular surface. 2. No extension of fracture lines is seen through the outer metaphyseal cortex nor into the proximal tibiofibular joint. 3. Sizable lipohemarthrosis with scattered loose body calcifications in the fluid. 4. Meniscal as well as articular cartilage calcifications consistent with CPPD. 5. Additional ligamentous calcifications. 6. Vascular calcifications including all 3 popliteal trifurcation arteries. 7. Osteopenia and degenerative change. Electronically Signed   By: Telford Nab M.D.   On: 03/19/2022 22:30  ? ?DG Knee Complete 4 Views Left ? ?Result Date: 03/19/2022 ?CLINICAL DATA:  Fall. EXAM: LEFT KNEE - COMPLETE 4+ VIEW COMPARISON:  None Available. FINDINGS: Lipohemarthrosis is likely present. There is mild depression of the lateral tibial plateau worrisome for fracture. The bones are diffusely osteopenic. There is no dislocation. Joint  spaces are maintained. There is mediolateral compartment chondrocalcinosis, likely degenerative. Peripheral vascular calcifications are present. IMPRESSION: 1. Findings suspicious for mildly depressed lateral tibial plateau fracture. Consider further evaluation with CT. 2. Lipohemarthrosis likely present. 3. Diffuse osteopenia and degenerative changes as above. Electronically Signed   By: Ronney Asters M.D.   On: 03/19/2022 20:59   ? ?Procedures ?Procedures  ? ? ?Medications Ordered in ED ?Medications  ?oxyCODONE-acetaminophen (PERCOCET/ROXICET) 5-325 MG per tablet 1 tablet (1 tablet Oral Given 03/19/22 2221)  ? ? ?ED Course/ Medical Decision Making/ A&P ?  ?                        ?Medical Decision  Making ?Amount and/or Complexity of Data Reviewed ?Radiology: ordered. ? ?Risk ?Prescription drug management. ? ? ?This patient presents to the ED for concern of knee pain and injury differential diagnosis includes frac

## 2022-03-19 NOTE — ED Triage Notes (Signed)
Pt reports fall off step stool today.  Unable to bear weight on L leg.  Reports pain to L knee.  Pt is alert and oriented.  Denies hitting head.  Reports stool flipped while getting off stool.  No loc.   ?

## 2022-03-19 NOTE — Discharge Instructions (Addendum)
You are not to walk on your leg at all.  I have given you a prescription for a wheelchair as well as crutches to try to get around.  I have also given you a prescription for some very strong pain medications.  He may take 1 tablet every 6 hours as needed but be aware that this may make you constipated so take a stool softener with it. ? ?ER for severe worsening symptoms ? ?You will need to be seen within the next couple of days by the orthopedist for follow-up as you may need surgery for this injury ?

## 2022-03-31 ENCOUNTER — Telehealth: Payer: Self-pay | Admitting: Orthopedic Surgery

## 2022-03-31 NOTE — Telephone Encounter (Signed)
-----   Message from Carole Civil, MD sent at 03/20/2022  8:43 AM EDT ----- ?Give appt for wed or thurs ? ?

## 2022-03-31 NOTE — Telephone Encounter (Signed)
Per Wendy's call to patient, and per follow up call to patient per Dr Ruthe Mannan staff message, called back to patient, who said she thought she already had an orthopaedic appointment in [place].I then spoke with patient's daughter in law Vickii Chafe who was there with patient; states she's been going to ortho on [address] in [place], and has another appointment there on 04/06/22; therefore does not need an appointment at our clinic. ?

## 2022-04-12 ENCOUNTER — Ambulatory Visit (HOSPITAL_COMMUNITY): Payer: Medicare Other

## 2022-04-28 ENCOUNTER — Emergency Department (HOSPITAL_COMMUNITY): Payer: Medicare Other

## 2022-04-28 ENCOUNTER — Emergency Department (HOSPITAL_COMMUNITY)
Admission: EM | Admit: 2022-04-28 | Discharge: 2022-04-28 | Disposition: A | Discharge status: Home / Self Care | Payer: Medicare Other | Attending: Emergency Medicine | Admitting: Emergency Medicine

## 2022-04-28 ENCOUNTER — Other Ambulatory Visit: Payer: Self-pay

## 2022-04-28 DIAGNOSIS — M25512 Pain in left shoulder: Secondary | ICD-10-CM | POA: Insufficient documentation

## 2022-04-28 DIAGNOSIS — R300 Dysuria: Secondary | ICD-10-CM | POA: Insufficient documentation

## 2022-04-28 DIAGNOSIS — R079 Chest pain, unspecified: Secondary | ICD-10-CM | POA: Insufficient documentation

## 2022-04-28 DIAGNOSIS — R197 Diarrhea, unspecified: Secondary | ICD-10-CM | POA: Diagnosis present

## 2022-04-28 DIAGNOSIS — D72829 Elevated white blood cell count, unspecified: Secondary | ICD-10-CM | POA: Insufficient documentation

## 2022-04-28 DIAGNOSIS — R5381 Other malaise: Secondary | ICD-10-CM | POA: Diagnosis not present

## 2022-04-28 LAB — COMPREHENSIVE METABOLIC PANEL
ALT: 20 U/L (ref 0–44)
AST: 24 U/L (ref 15–41)
Albumin: 3.6 g/dL (ref 3.5–5.0)
Alkaline Phosphatase: 122 U/L (ref 38–126)
Anion gap: 8 (ref 5–15)
BUN: 26 mg/dL — ABNORMAL HIGH (ref 8–23)
CO2: 24 mmol/L (ref 22–32)
Calcium: 9.3 mg/dL (ref 8.9–10.3)
Chloride: 102 mmol/L (ref 98–111)
Creatinine, Ser: 0.71 mg/dL (ref 0.44–1.00)
GFR, Estimated: >60 mL/min (ref >60)
Glucose, Bld: 118 mg/dL — ABNORMAL HIGH (ref 70–99)
Potassium: 3.5 mmol/L (ref 3.5–5.1)
Sodium: 134 mmol/L — ABNORMAL LOW (ref 135–145)
Total Bilirubin: 1.5 mg/dL — ABNORMAL HIGH (ref 0.3–1.2)
Total Protein: 8.5 g/dL — ABNORMAL HIGH (ref 6.5–8.1)

## 2022-04-28 LAB — URINALYSIS, ROUTINE W REFLEX MICROSCOPIC
Bilirubin Urine: NEGATIVE
Glucose, UA: NEGATIVE mg/dL
Ketones, ur: NEGATIVE mg/dL
Nitrite: NEGATIVE
Protein, ur: NEGATIVE mg/dL
Specific Gravity, Urine: 1.011 (ref 1.005–1.030)
pH: 5 (ref 5.0–8.0)

## 2022-04-28 LAB — CBC WITH DIFFERENTIAL/PLATELET
Abs Immature Granulocytes: 0.12 10*3/uL — ABNORMAL HIGH (ref 0.00–0.07)
Basophils Absolute: 0.1 10*3/uL (ref 0.0–0.1)
Basophils Relative: 1 %
Eosinophils Absolute: 0.3 10*3/uL (ref 0.0–0.5)
Eosinophils Relative: 2 %
HCT: 39.3 % (ref 36.0–46.0)
Hemoglobin: 12.5 g/dL (ref 12.0–15.0)
Immature Granulocytes: 1 %
Lymphocytes Relative: 9 %
Lymphs Abs: 1.4 10*3/uL (ref 0.7–4.0)
MCH: 30.9 pg (ref 26.0–34.0)
MCHC: 31.8 g/dL (ref 30.0–36.0)
MCV: 97.3 fL (ref 80.0–100.0)
Monocytes Absolute: 1 10*3/uL (ref 0.1–1.0)
Monocytes Relative: 6 %
Neutro Abs: 12.9 10*3/uL — ABNORMAL HIGH (ref 1.7–7.7)
Neutrophils Relative %: 81 %
Platelets: 455 10*3/uL — ABNORMAL HIGH (ref 150–400)
RBC: 4.04 MIL/uL (ref 3.87–5.11)
RDW: 12.5 % (ref 11.5–15.5)
WBC: 15.7 10*3/uL — ABNORMAL HIGH (ref 4.0–10.5)
nRBC: 0 % (ref 0.0–0.2)

## 2022-04-28 LAB — LACTIC ACID, PLASMA: Lactic Acid, Venous: 1.5 mmol/L (ref 0.5–1.9)

## 2022-04-28 LAB — PROTIME-INR
INR: 1.1 (ref 0.8–1.2)
Prothrombin Time: 14.6 seconds (ref 11.4–15.2)

## 2022-04-28 LAB — APTT: aPTT: 30 seconds (ref 24–36)

## 2022-04-28 MED ORDER — LACTATED RINGERS IV BOLUS (SEPSIS)
1000.0000 mL | Freq: Once | INTRAVENOUS | Status: AC
  Administered 2022-04-28: 1000 mL via INTRAVENOUS

## 2022-04-28 NOTE — ED Provider Notes (Signed)
[facility] EMERGENCY DEPARTMENT Provider Note   CSN: [id number] Arrival date & time: 04/28/22  1548     History {Add pertinent medical, surgical, social history, OB history to HPI:1} Chief Complaint  Patient presents with   Recurrent UTI    Wanda [name] is a 88 y.o. female presenting from home with multiple complaints.  Her son reports the patient has had recurrent UTI symptoms despite 2 courses of antibiotics with amoxicillin and ciprofloxacin, with recurrence of her confusion and dysuria immediately after finishing her ciprofloxacin on Friday.  She is also had a poor appetite, diarrhea, generalized malaise.  She felt that she pulled something in her left shoulder using her walker because she has a bad leg, and has also been complaining of chest pain is worse with movement.  HPI     Home Medications Prior to Admission medications   Medication Sig Start Date End Date Taking? Authorizing Provider  calcium citrate-vitamin D 500-400 MG-UNIT chewable tablet Chew 2 tablets by mouth daily.    [provider]  Cholecalciferol (DIALYVITE VITAMIN D 5000) 125 MCG (5000 UT) capsule Take 5,000 Units by mouth daily.    [provider]  ciclopirox (PENLAC) 8 % solution Apply 1 application topically at bedtime. 12/20/20   [provider]  cyanocobalamin (,VITAMIN B-12,) 1000 MCG/ML injection Inject 1,000 mcg into the muscle every 30 (thirty) days. 01/10/21   [provider]  FEROSUL 325 (65 Fe) MG tablet TAKE (1) TABLET BY MOUTH TWICE A DAY AFTER MEALS (BREAKFAST AND SUPPER). 05/12/21   Mahala Menghini, PA-C  HYDROcodone-acetaminophen (HYCET) 7.5-325 mg/15 ml solution Take 15 mLs by mouth 4 (four) times daily as needed for moderate pain. 07/09/21 07/09/22  Ralene Ok, MD  lisinopril-hydrochlorothiazide (ZESTORETIC) 20-12.5 MG tablet Take 1 tablet by mouth daily. 02/11/21   [provider]  Multiple Vitamin (MULTIVITAMIN) tablet Take 2 tablets by mouth  daily.    [provider]  oxyCODONE-acetaminophen (PERCOCET) 5-325 MG tablet Take 1 tablet by mouth every 6 (six) hours as needed. 03/19/22   Noemi Chapel, MD  pantoprazole (PROTONIX) 40 MG tablet Take 40 mg by mouth 2 (two) times daily. 04/18/21   [provider]  sucralfate (CARAFATE) 1 g tablet Take 1 g by mouth 4 (four) times daily.    [provider]      Allergies    Sulfa antibiotics and Naproxen sodium    Review of Systems   Review of Systems  Physical Exam Updated Vital Signs BP 107/74 (BP Location: Right Arm)   Pulse (!) 107   Temp 99.6 F (37.6 C) (Oral)   Resp 18   SpO2 99%  Physical Exam Constitutional:      General: She is not in acute distress. HENT:     Head: Normocephalic and atraumatic.  Eyes:     Conjunctiva/sclera: Conjunctivae normal.     Pupils: Pupils are equal, round, and reactive to light.  Cardiovascular:     Rate and Rhythm: Normal rate and regular rhythm.  Pulmonary:     Effort: Pulmonary effort is normal. No respiratory distress.  Abdominal:     General: There is no distension.     Tenderness: There is no abdominal tenderness.  Skin:    General: Skin is warm and dry.  Neurological:     General: No focal deficit present.     Mental Status: She is alert. Mental status is at baseline.  Psychiatric:        Mood and Affect:  Mood normal.        Behavior: Behavior normal.     ED Results / Procedures / Treatments   Labs (all labs ordered are listed, but only abnormal results are displayed) Labs Reviewed  URINALYSIS, ROUTINE W REFLEX MICROSCOPIC    EKG None  Radiology No results found.  Procedures Procedures  {Document cardiac monitor, telemetry assessment procedure when appropriate:1}  Medications Ordered in ED Medications - No data to display  ED Course/ Medical Decision Making/ A&P                           Medical Decision Making Amount and/or Complexity of Data Reviewed Labs: ordered. Radiology:  ordered. ECG/medicine tests: ordered.   This patient presents to the ED with concern for ***. This involves an extensive number of treatment options, and is a complaint that carries with it a high risk of complications and morbidity.  The differential diagnosis includes ***  Co-morbidities that complicate the patient evaluation: ***  Additional history obtained from ***  External records from outside source obtained and reviewed including ***  I ordered and personally interpreted labs.  The pertinent results include:  ***  I ordered imaging studies including *** I independently visualized and interpreted imaging which showed *** I agree with the radiologist interpretation  The patient was maintained on a cardiac monitor.  I personally viewed and interpreted the cardiac monitored which showed an underlying rhythm of: ***  Per my interpretation the patient's ECG shows ***  I ordered medication including ***  for *** I have reviewed the patients home medicines and have made adjustments as needed  Test Considered: ***  I requested consultation with the ***,  and discussed lab and imaging findings as well as pertinent plan - they recommend: ***  After the interventions noted above, I reevaluated the patient and found that they have: {resolved/improved/worsened:23923::"improved"}  Social Determinants of Health:***  Dispostion:  After consideration of the diagnostic results and the patients response to treatment, I feel that the patent would benefit from ***.   {Document critical care time when appropriate:1} {Document review of labs and clinical decision tools ie heart score, Chads2Vasc2 etc:1}  {Document your independent review of radiology images, and any outside records:1} {Document your discussion with family members, caretakers, and with consultants:1} {Document social determinants of health affecting pt's care:1} {Document your decision making why or why not admission,  treatments were needed:1} Final Clinical Impression(s) / ED Diagnoses Final diagnoses:  None    Rx / DC Orders ED Discharge Orders     None

## 2022-04-28 NOTE — Discharge Instructions (Addendum)
Please contact your primary care provider's office tomorrow about following up for your visit today.  We talked about the diarrhea that you have been having.  It is possible that there is an infection causing your diarrhea, which could be caused by the antibiotics that you are on.  Your doctor's office should order stool studies on you as an outpatient.  Continue drinking plenty of water at home.  If you have worsening weakness, confusion, inability to drink water, or eat food, stay hydrated, come back to the ER.

## 2022-04-28 NOTE — ED Triage Notes (Addendum)
Pt has taken two rounds of abx for UTI. C/o dark urine, ams per son. Nad at this time in triage. A/o to most. With son.  Diarrhea x 2 since yesterday

## 2022-04-30 LAB — URINE CULTURE: Culture: <10000 — AB

## 2022-05-03 LAB — CULTURE, BLOOD (ROUTINE X 2)
Culture: NO GROWTH
Culture: NO GROWTH
Special Requests: ADEQUATE
Special Requests: ADEQUATE

## 2022-05-12 ENCOUNTER — Ambulatory Visit: Payer: Medicare Other | Admitting: Internal Medicine

## 2023-01-14 ENCOUNTER — Encounter: Payer: Self-pay | Admitting: Radiology

## 2023-02-11 ENCOUNTER — Ambulatory Visit: Payer: Medicare Other | Admitting: Physician Assistant

## 2023-02-11 ENCOUNTER — Other Ambulatory Visit: Payer: Medicare Other

## 2023-02-16 ENCOUNTER — Inpatient Hospital Stay (HOSPITAL_COMMUNITY)
Admission: EM | Admit: 2023-02-16 | Discharge: 2023-02-21 | DRG: 480 | Disposition: A | Discharge status: Skilled Nursing Facility | Payer: Medicare Other | Attending: Internal Medicine | Admitting: Internal Medicine

## 2023-02-16 ENCOUNTER — Emergency Department (HOSPITAL_COMMUNITY): Payer: Medicare Other

## 2023-02-16 ENCOUNTER — Encounter (HOSPITAL_COMMUNITY): Payer: Self-pay | Admitting: *Deleted

## 2023-02-16 DIAGNOSIS — Z5982 Transportation insecurity: Secondary | ICD-10-CM

## 2023-02-16 DIAGNOSIS — Z882 Allergy status to sulfonamides status: Secondary | ICD-10-CM

## 2023-02-16 DIAGNOSIS — Z807 Family history of other malignant neoplasms of lymphoid, hematopoietic and related tissues: Secondary | ICD-10-CM | POA: Diagnosis not present

## 2023-02-16 DIAGNOSIS — Z886 Allergy status to analgesic agent status: Secondary | ICD-10-CM

## 2023-02-16 DIAGNOSIS — W19XXXA Unspecified fall, initial encounter: Principal | ICD-10-CM

## 2023-02-16 DIAGNOSIS — I10 Essential (primary) hypertension: Secondary | ICD-10-CM | POA: Diagnosis present

## 2023-02-16 DIAGNOSIS — S72002A Fracture of unspecified part of neck of left femur, initial encounter for closed fracture: Secondary | ICD-10-CM | POA: Diagnosis not present

## 2023-02-16 DIAGNOSIS — G4730 Sleep apnea, unspecified: Secondary | ICD-10-CM | POA: Diagnosis present

## 2023-02-16 DIAGNOSIS — E538 Deficiency of other specified B group vitamins: Secondary | ICD-10-CM | POA: Diagnosis present

## 2023-02-16 DIAGNOSIS — N390 Urinary tract infection, site not specified: Secondary | ICD-10-CM | POA: Diagnosis present

## 2023-02-16 DIAGNOSIS — K5900 Constipation, unspecified: Secondary | ICD-10-CM | POA: Diagnosis present

## 2023-02-16 DIAGNOSIS — E860 Dehydration: Secondary | ICD-10-CM | POA: Diagnosis present

## 2023-02-16 DIAGNOSIS — G9341 Metabolic encephalopathy: Secondary | ICD-10-CM | POA: Diagnosis present

## 2023-02-16 DIAGNOSIS — K219 Gastro-esophageal reflux disease without esophagitis: Secondary | ICD-10-CM | POA: Diagnosis present

## 2023-02-16 DIAGNOSIS — M1990 Unspecified osteoarthritis, unspecified site: Secondary | ICD-10-CM | POA: Diagnosis present

## 2023-02-16 DIAGNOSIS — D509 Iron deficiency anemia, unspecified: Secondary | ICD-10-CM | POA: Diagnosis present

## 2023-02-16 DIAGNOSIS — E876 Hypokalemia: Secondary | ICD-10-CM | POA: Diagnosis present

## 2023-02-16 DIAGNOSIS — S72142A Displaced intertrochanteric fracture of left femur, initial encounter for closed fracture: Secondary | ICD-10-CM | POA: Diagnosis not present

## 2023-02-16 DIAGNOSIS — Z8711 Personal history of peptic ulcer disease: Secondary | ICD-10-CM | POA: Diagnosis not present

## 2023-02-16 DIAGNOSIS — Z79899 Other long term (current) drug therapy: Secondary | ICD-10-CM | POA: Diagnosis not present

## 2023-02-16 DIAGNOSIS — W1830XA Fall on same level, unspecified, initial encounter: Secondary | ICD-10-CM | POA: Diagnosis present

## 2023-02-16 DIAGNOSIS — Z8249 Family history of ischemic heart disease and other diseases of the circulatory system: Secondary | ICD-10-CM | POA: Diagnosis not present

## 2023-02-16 LAB — COMPREHENSIVE METABOLIC PANEL
ALT: 27 U/L (ref 0–44)
AST: 46 U/L — ABNORMAL HIGH (ref 15–41)
Albumin: 4.1 g/dL (ref 3.5–5.0)
Alkaline Phosphatase: 134 U/L — ABNORMAL HIGH (ref 38–126)
Anion gap: 14 (ref 5–15)
BUN: 13 mg/dL (ref 8–23)
CO2: 25 mmol/L (ref 22–32)
Calcium: 9 mg/dL (ref 8.9–10.3)
Chloride: 97 mmol/L — ABNORMAL LOW (ref 98–111)
Creatinine, Ser: 0.66 mg/dL (ref 0.44–1.00)
GFR, Estimated: >60 mL/min (ref >60)
Glucose, Bld: 156 mg/dL — ABNORMAL HIGH (ref 70–99)
Potassium: 2.5 mmol/L — CL (ref 3.5–5.1)
Sodium: 136 mmol/L (ref 135–145)
Total Bilirubin: 2.3 mg/dL — ABNORMAL HIGH (ref 0.3–1.2)
Total Protein: 7.8 g/dL (ref 6.5–8.1)

## 2023-02-16 LAB — CBC WITH DIFFERENTIAL/PLATELET
Abs Immature Granulocytes: 0.09 10*3/uL — ABNORMAL HIGH (ref 0.00–0.07)
Basophils Absolute: 0 10*3/uL (ref 0.0–0.1)
Basophils Relative: 0 %
Eosinophils Absolute: 0 10*3/uL (ref 0.0–0.5)
Eosinophils Relative: 0 %
HCT: 38.8 % (ref 36.0–46.0)
Hemoglobin: 13.2 g/dL (ref 12.0–15.0)
Immature Granulocytes: 1 %
Lymphocytes Relative: 3 %
Lymphs Abs: 0.6 10*3/uL — ABNORMAL LOW (ref 0.7–4.0)
MCH: 32 pg (ref 26.0–34.0)
MCHC: 34 g/dL (ref 30.0–36.0)
MCV: 93.9 fL (ref 80.0–100.0)
Monocytes Absolute: 0.6 10*3/uL (ref 0.1–1.0)
Monocytes Relative: 3 %
Neutro Abs: 15.4 10*3/uL — ABNORMAL HIGH (ref 1.7–7.7)
Neutrophils Relative %: 93 %
Platelets: 327 10*3/uL (ref 150–400)
RBC: 4.13 MIL/uL (ref 3.87–5.11)
RDW: 12.7 % (ref 11.5–15.5)
WBC: 16.7 10*3/uL — ABNORMAL HIGH (ref 4.0–10.5)
nRBC: 0 % (ref 0.0–0.2)

## 2023-02-16 LAB — URINALYSIS, ROUTINE W REFLEX MICROSCOPIC
Bacteria, UA: NONE SEEN
Bilirubin Urine: NEGATIVE
Glucose, UA: 50 mg/dL — AB
Ketones, ur: 20 mg/dL — AB
Leukocytes,Ua: NEGATIVE
Nitrite: NEGATIVE
Protein, ur: 30 mg/dL — AB
Specific Gravity, Urine: 1.012 (ref 1.005–1.030)
pH: 7 (ref 5.0–8.0)

## 2023-02-16 LAB — PROTIME-INR
INR: 1.1 (ref 0.8–1.2)
Prothrombin Time: 13.8 seconds (ref 11.4–15.2)

## 2023-02-16 LAB — I-STAT CHEM 8, ED
BUN: 13 mg/dL (ref 8–23)
Calcium, Ion: 1.08 mmol/L — ABNORMAL LOW (ref 1.15–1.40)
Chloride: 97 mmol/L — ABNORMAL LOW (ref 98–111)
Creatinine, Ser: 0.5 mg/dL (ref 0.44–1.00)
Glucose, Bld: 157 mg/dL — ABNORMAL HIGH (ref 70–99)
HCT: 43 % (ref 36.0–46.0)
Hemoglobin: 14.6 g/dL (ref 12.0–15.0)
Potassium: 2.6 mmol/L — CL (ref 3.5–5.1)
Sodium: 139 mmol/L (ref 135–145)
TCO2: 26 mmol/L (ref 22–32)

## 2023-02-16 LAB — POTASSIUM: Potassium: 3 mmol/L — ABNORMAL LOW (ref 3.5–5.1)

## 2023-02-16 LAB — TROPONIN I (HIGH SENSITIVITY)
Troponin I (High Sensitivity): 27 ng/L — ABNORMAL HIGH (ref <18)
Troponin I (High Sensitivity): 26 ng/L — ABNORMAL HIGH (ref <18)

## 2023-02-16 LAB — CK: Total CK: 665 U/L — ABNORMAL HIGH (ref 38–234)

## 2023-02-16 LAB — MAGNESIUM: Magnesium: 1.6 mg/dL — ABNORMAL LOW (ref 1.7–2.4)

## 2023-02-16 MED ORDER — SODIUM CHLORIDE 0.9 % IV SOLN
1.0000 g | INTRAVENOUS | Status: AC
  Administered 2023-02-16 – 2023-02-18 (×3): 1 g via INTRAVENOUS
  Filled 2023-02-16 (×3): qty 10

## 2023-02-16 MED ORDER — POTASSIUM CHLORIDE 10 MEQ/100ML IV SOLN
10.0000 meq | INTRAVENOUS | Status: AC
  Administered 2023-02-16 (×5): 10 meq via INTRAVENOUS
  Filled 2023-02-16 (×5): qty 100

## 2023-02-16 MED ORDER — FENTANYL CITRATE PF 50 MCG/ML IJ SOSY
50.0000 ug | PREFILLED_SYRINGE | INTRAMUSCULAR | Status: DC | PRN
  Administered 2023-02-16 – 2023-02-19 (×4): 50 ug via INTRAVENOUS
  Filled 2023-02-16 (×4): qty 1

## 2023-02-16 MED ORDER — ACETAMINOPHEN 325 MG PO TABS: 650.0000 mg | ORAL_TABLET | Freq: Four times a day (QID) | ORAL | Status: DC | PRN

## 2023-02-16 MED ORDER — LACTATED RINGERS IV SOLN: INTRAVENOUS | Status: DC

## 2023-02-16 MED ORDER — OXYCODONE-ACETAMINOPHEN 5-325 MG PO TABS
1.0000 | ORAL_TABLET | Freq: Once | ORAL | Status: AC
  Administered 2023-02-16: 1 via ORAL
  Filled 2023-02-16: qty 1

## 2023-02-16 MED ORDER — CEFAZOLIN SODIUM-DEXTROSE 2-4 GM/100ML-% IV SOLN
2.0000 g | INTRAVENOUS | Status: DC
  Filled 2023-02-16: qty 100

## 2023-02-16 MED ORDER — POTASSIUM CHLORIDE 10 MEQ/100ML IV SOLN
INTRAVENOUS | Status: AC
  Filled 2023-02-16: qty 100

## 2023-02-16 MED ORDER — MAGNESIUM SULFATE 2 GM/50ML IV SOLN
2.0000 g | Freq: Once | INTRAVENOUS | Status: AC
  Administered 2023-02-16: 2 g via INTRAVENOUS
  Filled 2023-02-16 (×2): qty 50

## 2023-02-16 MED ORDER — SENNA 8.6 MG PO TABS
1.0000 | ORAL_TABLET | Freq: Two times a day (BID) | ORAL | Status: DC
  Administered 2023-02-17 – 2023-02-21 (×8): 8.6 mg via ORAL
  Filled 2023-02-16 (×9): qty 1

## 2023-02-16 MED ORDER — TRANEXAMIC ACID-NACL 1000-0.7 MG/100ML-% IV SOLN
1000.0000 mg | INTRAVENOUS | Status: AC
  Administered 2023-02-18: 1000 mg via INTRAVENOUS
  Filled 2023-02-16: qty 100

## 2023-02-16 MED ORDER — POTASSIUM CHLORIDE CRYS ER 20 MEQ PO TBCR
40.0000 meq | EXTENDED_RELEASE_TABLET | Freq: Once | ORAL | Status: AC
  Administered 2023-02-16: 40 meq via ORAL
  Filled 2023-02-16: qty 2

## 2023-02-16 MED ORDER — ONDANSETRON HCL 4 MG/2ML IJ SOLN: 4.0000 mg | Freq: Four times a day (QID) | INTRAMUSCULAR | Status: DC | PRN

## 2023-02-16 MED ORDER — LACTATED RINGERS IV BOLUS
1000.0000 mL | Freq: Once | INTRAVENOUS | Status: AC
  Administered 2023-02-16: 1000 mL via INTRAVENOUS

## 2023-02-16 MED ORDER — HYDROCODONE-ACETAMINOPHEN 5-325 MG PO TABS
1.0000 | ORAL_TABLET | Freq: Four times a day (QID) | ORAL | Status: DC | PRN
  Administered 2023-02-17 – 2023-02-19 (×4): 1 via ORAL
  Filled 2023-02-16 (×4): qty 1

## 2023-02-16 MED ORDER — HYDRALAZINE HCL 20 MG/ML IJ SOLN: 10.0000 mg | Freq: Four times a day (QID) | INTRAMUSCULAR | Status: DC | PRN

## 2023-02-16 MED ORDER — POTASSIUM CHLORIDE IN NACL 20-0.45 MEQ/L-% IV SOLN
INTRAVENOUS | Status: AC
  Filled 2023-02-16 (×3): qty 1000

## 2023-02-16 NOTE — Progress Notes (Signed)
Attempted to get report x1, gave my extension, was told nurse would call back.

## 2023-02-16 NOTE — ED Provider Notes (Signed)
[place] Provider Note   CSN: [id number] Arrival date & time: 02/16/23  1120     History  Chief Complaint  Patient presents with   Wanda [name] is a 89 y.o. female.   Fall  Patient presents for a fall.  Medical history includes anemia, GERD, GI bleed, sleep apnea.  She was recently diagnosed with UTI.  She was started on ciprofloxacin.  She lives alone.  This morning, family found her on the floor in the home.  Patient is complaining of left hip pain.  She denies any other current areas of discomfort.  She does not remember her fall last night.  She is not on a blood thinner.  History per son: Family has noticed intermittent confusion over the weekend.  Patient underwent dipstick urinalysis which showed evidence of infection.  She started on antibiotic yesterday.  Urine cultures from PCPs office are pending.  There was also concern of dehydration and patient was encouraged to drink plenty of fluids yesterday.     Home Medications Prior to Admission medications   Medication Sig Start Date End Date Taking? Authorizing Provider  calcium citrate-vitamin D 500-400 MG-UNIT chewable tablet Chew 2 tablets by mouth daily.   Yes [provider]  CIPRO 250 MG tablet Take 250 mg by mouth 2 (two) times daily. 02/15/23  Yes [provider]  cyanocobalamin (,VITAMIN B-12,) 1000 MCG/ML injection Inject 1,000 mcg into the muscle every 30 (thirty) days. 01/10/21  Yes [provider]  lisinopril-hydrochlorothiazide (ZESTORETIC) 20-12.5 MG tablet Take 1 tablet by mouth daily. 02/11/21  Yes [provider]  oxyCODONE-acetaminophen (PERCOCET) 5-325 MG tablet Take 1 tablet by mouth every 6 (six) hours as needed. Patient not taking: Reported on 04/28/2022 03/19/22   Noemi Chapel, MD      Allergies    Sulfa antibiotics and Naproxen sodium    Review of Systems   Review of Systems  Musculoskeletal:  Positive for  arthralgias.  All other systems reviewed and are negative.   Physical Exam Updated Vital Signs BP (!) 185/86 (BP Location: Right Arm)   Pulse 100   Temp 98.1 F (36.7 C) (Oral)   Resp 19   Ht 4\' 8"  (1.422 m)   Wt 54.4 kg   SpO2 98%   BMI 26.90 kg/m  Physical Exam Vitals and nursing note reviewed.  Constitutional:      General: She is not in acute distress.    Appearance: Normal appearance. She is well-developed. She is not ill-appearing, toxic-appearing or diaphoretic.  HENT:     Head: Normocephalic and atraumatic.     Right Ear: External ear normal.     Left Ear: External ear normal.     Nose: Nose normal.     Mouth/Throat:     Mouth: Mucous membranes are moist.  Eyes:     Extraocular Movements: Extraocular movements intact.     Conjunctiva/sclera: Conjunctivae normal.  Cardiovascular:     Rate and Rhythm: Normal rate and regular rhythm.     Heart sounds: No murmur heard. Pulmonary:     Effort: Pulmonary effort is normal. No respiratory distress.     Breath sounds: Normal breath sounds. No wheezing or rales.  Chest:     Chest wall: No tenderness.  Abdominal:     General: There is no distension.     Palpations: Abdomen is soft.     Tenderness: There is no abdominal tenderness.  Musculoskeletal:  General: No swelling.     Cervical back: Normal range of motion and neck supple.     Right lower leg: No edema.     Left lower leg: No edema.  Skin:    General: Skin is warm and dry.     Coloration: Skin is not jaundiced or pale.  Neurological:     General: No focal deficit present.     Mental Status: She is alert and oriented to person, place, and time.     Cranial Nerves: No cranial nerve deficit.     Sensory: No sensory deficit.     Motor: No weakness.  Psychiatric:        Mood and Affect: Mood normal.        Behavior: Behavior normal.        Thought Content: Thought content normal.        Judgment: Judgment normal.     ED Results / Procedures /  Treatments   Labs (all labs ordered are listed, but only abnormal results are displayed) Labs Reviewed  CBC WITH DIFFERENTIAL/PLATELET - Abnormal; Notable for the following components:      Result Value   WBC 16.7 (*)    Neutro Abs 15.4 (*)    Lymphs Abs 0.6 (*)    Abs Immature Granulocytes 0.09 (*)    All other components within normal limits  COMPREHENSIVE METABOLIC PANEL - Abnormal; Notable for the following components:   Potassium 2.5 (*)    Chloride 97 (*)    Glucose, Bld 156 (*)    AST 46 (*)    Alkaline Phosphatase 134 (*)    Total Bilirubin 2.3 (*)    All other components within normal limits  URINALYSIS, ROUTINE W REFLEX MICROSCOPIC - Abnormal; Notable for the following components:   Glucose, UA 50 (*)    Hgb urine dipstick MODERATE (*)    Ketones, ur 20 (*)    Protein, ur 30 (*)    All other components within normal limits  CK - Abnormal; Notable for the following components:   Total CK 665 (*)    All other components within normal limits  MAGNESIUM - Abnormal; Notable for the following components:   Magnesium 1.6 (*)    All other components within normal limits  I-STAT CHEM 8, ED - Abnormal; Notable for the following components:   Potassium 2.6 (*)    Chloride 97 (*)    Glucose, Bld 157 (*)    Calcium, Ion 1.08 (*)    All other components within normal limits  TROPONIN I (HIGH SENSITIVITY) - Abnormal; Notable for the following components:   Troponin I (High Sensitivity) 27 (*)    All other components within normal limits  TROPONIN I (HIGH SENSITIVITY) - Abnormal; Notable for the following components:   Troponin I (High Sensitivity) 26 (*)    All other components within normal limits  PROTIME-INR  POTASSIUM  CBC  BASIC METABOLIC PANEL  MAGNESIUM  CK    EKG EKG Interpretation  Date/Time:  Tuesday February 16 2023 12:09:57 EDT Ventricular Rate:  86 PR Interval:  205 QRS Duration: 127 QT Interval:  430 QTC Calculation: 515 R Axis:   5 Text  Interpretation: Sinus rhythm Multiform ventricular premature complexes Nonspecific intraventricular conduction delay Anteroseptal infarct, age indeterminate Artifact in lead(s) I II III aVR aVL aVF V2 V6 Confirmed by Godfrey Pick [phone]) on 02/16/2023 1:25:34 PM  Radiology CT HEAD WO CONTRAST  Result Date: 02/16/2023 CLINICAL DATA:  Found down.  Trauma.  EXAM: CT HEAD WITHOUT CONTRAST CT CERVICAL SPINE WITHOUT CONTRAST TECHNIQUE: Multidetector CT imaging of the head and cervical spine was performed following the standard protocol without intravenous contrast. Multiplanar CT image reconstructions of the cervical spine were also generated. RADIATION DOSE REDUCTION: This exam was performed according to the departmental dose-optimization program which includes automated exposure control, adjustment of the mA and/or kV according to patient size and/or use of iterative reconstruction technique. COMPARISON:  None Available. FINDINGS: CT HEAD FINDINGS Brain: No acute intracranial hemorrhage. Mild chronic small-vessel disease. Gray-white differentiation is preserved. No hydrocephalus or extra-axial collection. No mass effect or midline shift. Vascular: No hyperdense vessel or unexpected calcification. Skull: No calvarial fracture or suspicious bone lesion. Skull base is unremarkable. Sinuses/Orbits: Unremarkable. Other: None. CT CERVICAL SPINE FINDINGS Alignment: Normal. Skull base and vertebrae: No acute fracture. Normal craniocervical junction. No suspicious bone lesions. Soft tissues and spinal canal: No prevertebral fluid or swelling. No visible canal hematoma. Disc levels: Mild cervical spondylosis without high-grade spinal canal stenosis. Upper chest: Unremarkable. Other: Atherosclerotic calcifications of the carotid bulbs. IMPRESSION: 1. No acute intracranial process. 2. No acute cervical spine fracture or traumatic malalignment. Electronically Signed   By: Emmit Alexanders M.D.   On: 02/16/2023 13:03   CT CERVICAL  SPINE WO CONTRAST  Result Date: 02/16/2023 CLINICAL DATA:  Found down.  Trauma. EXAM: CT HEAD WITHOUT CONTRAST CT CERVICAL SPINE WITHOUT CONTRAST TECHNIQUE: Multidetector CT imaging of the head and cervical spine was performed following the standard protocol without intravenous contrast. Multiplanar CT image reconstructions of the cervical spine were also generated. RADIATION DOSE REDUCTION: This exam was performed according to the departmental dose-optimization program which includes automated exposure control, adjustment of the mA and/or kV according to patient size and/or use of iterative reconstruction technique. COMPARISON:  None Available. FINDINGS: CT HEAD FINDINGS Brain: No acute intracranial hemorrhage. Mild chronic small-vessel disease. Gray-white differentiation is preserved. No hydrocephalus or extra-axial collection. No mass effect or midline shift. Vascular: No hyperdense vessel or unexpected calcification. Skull: No calvarial fracture or suspicious bone lesion. Skull base is unremarkable. Sinuses/Orbits: Unremarkable. Other: None. CT CERVICAL SPINE FINDINGS Alignment: Normal. Skull base and vertebrae: No acute fracture. Normal craniocervical junction. No suspicious bone lesions. Soft tissues and spinal canal: No prevertebral fluid or swelling. No visible canal hematoma. Disc levels: Mild cervical spondylosis without high-grade spinal canal stenosis. Upper chest: Unremarkable. Other: Atherosclerotic calcifications of the carotid bulbs. IMPRESSION: 1. No acute intracranial process. 2. No acute cervical spine fracture or traumatic malalignment. Electronically Signed   By: Emmit Alexanders M.D.   On: 02/16/2023 13:03   DG Chest Portable 1 View  Result Date: 02/16/2023 CLINICAL DATA:  Unwitnessed fall EXAM: PORTABLE CHEST 1 VIEW COMPARISON:  Chest radiograph dated 04/28/2022 FINDINGS: Normal lung volumes. Left basilar patchy opacity. No pleural effusion or pneumothorax. The heart size and mediastinal  contours are within normal limits. No radiographic finding of acute displaced fracture. IMPRESSION: 1. Left basilar patchy opacity, likely atelectasis. 2. No radiographic finding of acute displaced fracture. Electronically Signed   By: Darrin Nipper M.D.   On: 02/16/2023 12:06   DG Hip Unilat With Pelvis 2-3 Views Left  Result Date: 02/16/2023 CLINICAL DATA:  Unwitnessed fall with left upper leg pain EXAM: DG HIP (WITH OR WITHOUT PELVIS) 2V LEFT COMPARISON:  Left hip radiographs dated 11/04/2018 FINDINGS: Mildly angulated intertrochanteric left femoral fracture. The femoroacetabular joint is maintained. Degenerative changes of the bilateral hips and partially imaged lumbar spine. Otherwise no acute displaced pelvic fracture.  Vascular calcifications. IMPRESSION: Mildly angulated intertrochanteric left femoral fracture. Electronically Signed   By: Darrin Nipper M.D.   On: 02/16/2023 12:05    Procedures Procedures    Medications Ordered in ED Medications  fentaNYL (SUBLIMAZE) injection 50 mcg (has no administration in time range)  ceFAZolin (ANCEF) IVPB 2g/100 mL premix (has no administration in time range)  tranexamic acid (CYKLOKAPRON) IVPB 1,000 mg (has no administration in time range)  potassium chloride 10 mEq in 100 mL IVPB (10 mEq Intravenous New Bag/Given 02/16/23 1721)  HYDROcodone-acetaminophen (NORCO/VICODIN) 5-325 MG per tablet 1-2 tablet (has no administration in time range)  hydrALAZINE (APRESOLINE) injection 10 mg (has no administration in time range)  senna (SENOKOT) tablet 8.6 mg (has no administration in time range)  0.45 % NaCl with KCl 20 mEq / L infusion ( Intravenous New Bag/Given 02/16/23 1613)  acetaminophen (TYLENOL) tablet 650 mg (has no administration in time range)  ondansetron (ZOFRAN) injection 4 mg (has no administration in time range)  cefTRIAXone (ROCEPHIN) 1 g in sodium chloride 0.9 % 100 mL IVPB (has no administration in time range)  oxyCODONE-acetaminophen  (PERCOCET/ROXICET) 5-325 MG per tablet 1 tablet (1 tablet Oral Given 02/16/23 1202)  potassium chloride SA (KLOR-CON M) CR tablet 40 mEq (40 mEq Oral Given 02/16/23 1431)  magnesium sulfate IVPB 2 g 50 mL (2 g Intravenous New Bag/Given 02/16/23 1504)  lactated ringers bolus 1,000 mL (1,000 mLs Intravenous New Bag/Given 02/16/23 1507)    ED Course/ Medical Decision Making/ A&P                             Medical Decision Making Amount and/or Complexity of Data Reviewed Labs: ordered. Radiology: ordered.  Risk Prescription drug management. Decision regarding hospitalization.   This patient presents to the ED for concern of fall, this involves an extensive number of treatment options, and is a complaint that carries with it a high risk of complications and morbidity.  The differential diagnosis includes acute injuries, syncope, dehydration, rhabdomyolysis, CVA, seizure   Co morbidities that complicate the patient evaluation  anemia, GERD, GI bleed, sleep apnea   Additional history obtained:  Additional history obtained from patient's son External records from outside source obtained and reviewed including EMR   Lab Tests:  I Ordered, and personally interpreted labs.  The pertinent results include: Hypokalemia and hypomagnesemia are present.  CK is mildly elevated.  A leukocytosis is present.  Urinalysis is not consistent with UTI.   Imaging Studies ordered:  I ordered imaging studies including x-ray of chest and left hip; CT imaging of head and cervical spine I independently visualized and interpreted imaging which showed left hip fracture.  No other acute findings I agree with the radiologist interpretation   Cardiac Monitoring: / EKG:  The patient was maintained on a cardiac monitor.  I personally viewed and interpreted the cardiac monitored which showed an underlying rhythm of: Sinus rhythm   Consultations Obtained:  I requested consultation with the orthopedic surgeon,  Dr. Amedeo Kinsman,  and discussed lab and imaging findings as well as pertinent plan - they recommend: Admission to medicine with plan for operative repair   Problem List / ED Course / Critical interventions / Medication management  Patient presents after being found on floor by family.  She was last seen well last night.  Vital signs on arrival notable for hypertension.  She does take blood pressure medications and has not taken today.  She is  alert and oriented.  She does have some swelling and mild erythema to the left side of face, likely from prolonged downtime on floor.  She endorses pain only in the area of her left hip.  Pain is worsened with range of motion.  She has bruising noted to left lateral pelvic area.  Percocet was ordered for analgesia.  Diagnostic workup was initiated.  X-ray of left hip confirms acute fracture.  Fentanyl was ordered for ongoing analgesia.  I spoke with orthopedic surgeon on-call, Dr. Amedeo Kinsman, who will plan on operative management.  He does request medicine admission.  Patient's lab work is notable for hypokalemia and hypomagnesemia.  Replacement electrolytes were ordered.  Patient was admitted to medicine for further management. I ordered medication including Percocet and fentanyl for Jesus; potassium chloride for hypokalemia; magnesium sulfate for hypomagnesemia Reevaluation of the patient after these medicines showed that the patient improved I have reviewed the patients home medicines and have made adjustments as needed   Social Determinants of Health:  Lives independently  CRITICAL CARE Performed by: Godfrey Pick   Total critical care time: 35 minutes  Critical care time was exclusive of separately billable procedures and treating other patients.  Critical care was necessary to treat or prevent imminent or life-threatening deterioration.  Critical care was time spent personally by me on the following activities: development of treatment plan with patient  and/or surrogate as well as nursing, discussions with consultants, evaluation of patient's response to treatment, examination of patient, obtaining history from patient or surrogate, ordering and performing treatments and interventions, ordering and review of laboratory studies, ordering and review of radiographic studies, pulse oximetry and re-evaluation of patient's condition.        Final Clinical Impression(s) / ED Diagnoses Final diagnoses:  Fall, initial encounter  Closed fracture of left hip, initial encounter  Hypokalemia  Hypomagnesemia    Rx / DC Orders ED Discharge Orders     None         Godfrey Pick, MD 02/16/23 1810

## 2023-02-16 NOTE — Progress Notes (Signed)
Pt arrived to [address] via stretcher by ED staff. Was told by transporting nurse that previous nurse would call and give report. Phone number given, but no report was called. Charge nurse Ladell Heads, RN notified.

## 2023-02-16 NOTE — H&P (Signed)
[facility] History and Physical  JANAIYA FISHELL [id number] DOB: Apr 12, 1933 DOA: 02/16/2023   PCP: Renee Rival, NP  Specialists: None  Chief Complaint: Pain in the left hip  HPI: Wanda [name] is a 89 y.o. female with a past medical history of essential hypertension, frequent UTIs, B12 deficiency who was in her usual state of health untill the last 1 to 2 weeks when family members noted that the patient was more fatigued than usual.  She was more lethargic at times.  She also appeared to be a little bit more confused.  The symptoms worsened and so family decided to do a telehealth visit with provider.  Patient has a history of frequent UTIs and so urinary tract infection was suspected.  Apparently a dipstick testing was done which showed UTI.  Healthcare provider called in prescription for ciprofloxacin.  She took 1 dose yesterday afternoon.  She was supposed to take another dose last night but the son is not sure if she took that or not.  As of yesterday afternoon patient was able to walk without any difficulty.  Sometime during the night she apparently had a fall.  She was found on the floor when checked on by family members this morning.  She was complaining of pain in her left lower extremity.  She was brought into the emergency department.  Patient is pleasantly confused at this time.  Does not appear to be in a lot of discomfort.  Son is at the bedside and provided most of the information.  Patient was not able to give much information due to her confusion.  In the emergency department imaging studies showed left hip fracture.  She was hospitalized for further management.  Home Medications: Prior to Admission medications   Medication Sig Start Date End Date Taking? Authorizing Provider  calcium citrate-vitamin D 500-400 MG-UNIT chewable tablet Chew 2 tablets by mouth daily.   Yes [provider]  CIPRO 250 MG tablet Take 250 mg by mouth 2 (two) times daily.  02/15/23  Yes [provider]  cyanocobalamin (,VITAMIN B-12,) 1000 MCG/ML injection Inject 1,000 mcg into the muscle every 30 (thirty) days. 01/10/21  Yes [provider]  lisinopril-hydrochlorothiazide (ZESTORETIC) 20-12.5 MG tablet Take 1 tablet by mouth daily. 02/11/21  Yes [provider]  oxyCODONE-acetaminophen (PERCOCET) 5-325 MG tablet Take 1 tablet by mouth every 6 (six) hours as needed. Patient not taking: Reported on 04/28/2022 03/19/22   Noemi Chapel, MD    Allergies:  Allergies  Allergen Reactions   Sulfa Antibiotics     unknown   Naproxen Sodium Rash and Other (See Comments)    Passed out    Past Medical History: Past Medical History:  Diagnosis Date   Anemia    Cystocele    Diverticulosis 10/20/2010   Left-sided on colonoscopy by Dr. Gala Romney 10/20/10   Gastric polyps 10/02/2013   Gastritis 12/08/2010   EGD by Dr. 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 Aline Brochure currently   Sleep apnea    STOP BANG SCORE 4   Urge incontinence    Vitamin D deficiency     Past Surgical History:  Procedure Laterality Date   ABDOMINAL HYSTERECTOMY  ANTERIOR AND POSTERIOR REPAIR  05/17/2012   Procedure: ANTERIOR (CYSTOCELE) AND POSTERIOR REPAIR (RECTOCELE);  Surgeon: Jonnie Kind, MD;  Location: [facility];  Service: Gynecology;  Laterality: N/A;   BIOPSY  11/22/2018   Procedure: BIOPSY;  Surgeon: Daneil Dolin, MD;  [facility];  Service: Endoscopy;;   BIOPSY  02/12/2020   Procedure: BIOPSY;  Surgeon: Daneil Dolin, MD;  [facility]: [facility];  Service: Endoscopy;;  gastric biopsy   CATARACT EXTRACTION, BILATERAL     COLONOSCOPY  02/29/2012   Dr. Gala Romney: colonic  diverticulosis, minimal internal hemorrhoids, benign polyp   ESOPHAGOGASTRODUODENOSCOPY  02/29/2012   Dr. Gala Romney: atonic esophagus, moderate-sized hiatal hernia, fundal gland polyps   ESOPHAGOGASTRODUODENOSCOPY N/A 10/02/2013   Dr. Gala Romney- multiple Wilhemena Durie gastric polyps- not manipulated   ESOPHAGOGASTRODUODENOSCOPY N/A 01/14/2015   Dr. Gala Romney: Large hiatal hernia, otherwise normal EGD   ESOPHAGOGASTRODUODENOSCOPY (EGD) WITH PROPOFOL N/A 11/22/2018   Dr. Gala Romney: normal esophagus, large hiatal hernia, erosive gastropathy s/p biopsy, normal duodenum. Reactive gastropathy   ESOPHAGOGASTRODUODENOSCOPY (EGD) WITH PROPOFOL N/A 02/12/2020   normal esophagus, large hiatal hernia, [name] lesions, benign gastric biopsy   ESOPHAGOGASTRODUODENOSCOPY (EGD) WITH PROPOFOL N/A 12/26/2020   Procedure: ESOPHAGOGASTRODUODENOSCOPY (EGD) WITH PROPOFOL;  Surgeon: Daneil Dolin, MD;  [facility];  Service: Endoscopy;  Laterality: N/A;   ESOPHAGOGASTRODUODENOSCOPY (EGD) WITH PROPOFOL N/A 03/23/2021   Procedure: ESOPHAGOGASTRODUODENOSCOPY (EGD) WITH PROPOFOL;  Surgeon: Harvel Quale, MD;  [facility]: [facility];  Service: Gastroenterology;  Laterality: N/A;   GIVENS CAPSULE STUDY  02/29/2012   No explanation for IDA. Possible extrinsic compression but negative CT.    GIVENS CAPSULE STUDY N/A 12/26/2020   Procedure: GIVENS CAPSULE STUDY;  Surgeon: Daneil Dolin, MD;  [facility];  Service: Endoscopy;  Laterality: N/A;   INSERTION OF MESH N/A 07/08/2021   Procedure: INSERTION OF MESH;  Surgeon: Ralene Ok, MD;  Location: [place];  Service: General;  Laterality: N/A;   SALPINGOOPHORECTOMY  05/17/2012   Procedure: SALPINGO OOPHERECTOMY;  Surgeon: Jonnie Kind, MD;  Location: [facility];  Service: Gynecology;  Laterality: Bilateral;   VAGINAL HYSTERECTOMY  05/17/2012   Procedure: HYSTERECTOMY VAGINAL;  Surgeon: Jonnie Kind, MD;  Location: [facility];  Service: Gynecology;   Laterality: N/A;   XI ROBOTIC ASSISTED HIATAL HERNIA REPAIR N/A 07/08/2021   Procedure: XI ROBOTIC ASSISTED HIATAL HERNIA REPAIR WITH MESH AND FUNDOPLICATION;  Surgeon: Ralene Ok, MD;  Location: [place];  Service: General;  Laterality: N/A;    Social History: Lives by herself.  Uses a cane to ambulate.  No history of smoking alcohol use or illicit drug use.  Family History:  Family History  Problem Relation Age of Onset   Hypertension Mother    Lymphoma Brother    Anesthesia problems Neg Hx    Hypotension Neg Hx    Malignant hyperthermia Neg Hx    Pseudochol deficiency Neg Hx    Colon cancer Neg Hx       Review of Systems -unable to do due to her confusion  Physical Examination  Vitals:   02/16/23 1137 02/16/23 1140 02/16/23 1200 02/16/23 1230  BP: (!) 193/105  (!) 165/86 (!) 180/84  Pulse: 89  85 86  Resp: 14  18 (!) 25  Temp: (!) 97.5 F (36.4 C)     TempSrc: Oral     SpO2: 100%  98% 98%  Weight:  54.4 kg    Height:  4\' 8"  (1.422 m)  BP (!) 180/84   Pulse 86   Temp (!) 97.5 F (36.4 C) (Oral)   Resp (!) 25   Ht 4\' 8"  (1.422 m)   Wt 54.4 kg   SpO2 98%   BMI 26.90 kg/m   General appearance: alert, cooperative, distracted, and no distress Head: Normocephalic, without obvious abnormality, atraumatic Eyes: conjunctivae/corneas clear. PERRL, EOM's intact.  Throat: Dry mucous membranes noted Neck: no adenopathy, no carotid bruit, no JVD, supple, symmetrical, trachea midline, and thyroid not enlarged, symmetric, no tenderness/mass/nodules Resp: clear to auscultation bilaterally Cardio: regular rate and rhythm, S1, S2 normal, no murmur, click, rub or gallop GI: soft, non-tender; bowel sounds normal; no masses,  no organomegaly Extremities: No extremity is externally rotated.  Able to wiggle toes.  Able to move her ankle on the left side.  No obvious swelling of the left knee noted. Pulses: 2+ and symmetric Skin: Skin color, texture, turgor normal. No rashes  or lesions Lymph nodes: Cervical, supraclavicular, and axillary nodes normal. Neurologic: She is alert.  Somewhat disoriented.  Did not know where she was but she knew the year or the month.  She knew her son.  No facial asymmetry.  Motor strength equal bilateral upper extremities.    Labs on Admission: I have personally reviewed following labs and imaging studies  CBC: Recent Labs  Lab 02/16/23 1206 02/16/23 1217  WBC 16.7*  --   NEUTROABS 15.4*  --   HGB 13.2 14.6  HCT 38.8 43.0  MCV 93.9  --   PLT 327  --    Basic Metabolic Panel: Recent Labs  Lab 02/16/23 1206 02/16/23 1217  NA 136 139  K 2.5* 2.6*  CL 97* 97*  CO2 25  --   GLUCOSE 156* 157*  BUN 13 13  CREATININE 0.66 0.50  CALCIUM 9.0  --   MG 1.6*  --    GFR: Estimated Creatinine Clearance: 34 mL/min (by C-G formula based on SCr of 0.5 mg/dL). Liver Function Tests: Recent Labs  Lab 02/16/23 1206  AST 46*  ALT 27  ALKPHOS 134*  BILITOT 2.3*  PROT 7.8  ALBUMIN 4.1   Coagulation Profile: Recent Labs  Lab 02/16/23 1206  INR 1.1   Cardiac Enzymes: Recent Labs  Lab 02/16/23 1206  CKTOTAL 665*      Radiological Exams on Admission: CT HEAD WO CONTRAST  Result Date: 02/16/2023 CLINICAL DATA:  Found down.  Trauma. EXAM: CT HEAD WITHOUT CONTRAST CT CERVICAL SPINE WITHOUT CONTRAST TECHNIQUE: Multidetector CT imaging of the head and cervical spine was performed following the standard protocol without intravenous contrast. Multiplanar CT image reconstructions of the cervical spine were also generated. RADIATION DOSE REDUCTION: This exam was performed according to the departmental dose-optimization program which includes automated exposure control, adjustment of the mA and/or kV according to patient size and/or use of iterative reconstruction technique. COMPARISON:  None Available. FINDINGS: CT HEAD FINDINGS Brain: No acute intracranial hemorrhage. Mild chronic small-vessel disease. Gray-white differentiation  is preserved. No hydrocephalus or extra-axial collection. No mass effect or midline shift. Vascular: No hyperdense vessel or unexpected calcification. Skull: No calvarial fracture or suspicious bone lesion. Skull base is unremarkable. Sinuses/Orbits: Unremarkable. Other: None. CT CERVICAL SPINE FINDINGS Alignment: Normal. Skull base and vertebrae: No acute fracture. Normal craniocervical junction. No suspicious bone lesions. Soft tissues and spinal canal: No prevertebral fluid or swelling. No visible canal hematoma. Disc levels: Mild cervical spondylosis without high-grade spinal canal stenosis. Upper chest: Unremarkable. Other: Atherosclerotic calcifications of the carotid bulbs.  IMPRESSION: 1. No acute intracranial process. 2. No acute cervical spine fracture or traumatic malalignment. Electronically Signed   By: Emmit Alexanders M.D.   On: 02/16/2023 13:03   CT CERVICAL SPINE WO CONTRAST  Result Date: 02/16/2023 CLINICAL DATA:  Found down.  Trauma. EXAM: CT HEAD WITHOUT CONTRAST CT CERVICAL SPINE WITHOUT CONTRAST TECHNIQUE: Multidetector CT imaging of the head and cervical spine was performed following the standard protocol without intravenous contrast. Multiplanar CT image reconstructions of the cervical spine were also generated. RADIATION DOSE REDUCTION: This exam was performed according to the departmental dose-optimization program which includes automated exposure control, adjustment of the mA and/or kV according to patient size and/or use of iterative reconstruction technique. COMPARISON:  None Available. FINDINGS: CT HEAD FINDINGS Brain: No acute intracranial hemorrhage. Mild chronic small-vessel disease. Gray-white differentiation is preserved. No hydrocephalus or extra-axial collection. No mass effect or midline shift. Vascular: No hyperdense vessel or unexpected calcification. Skull: No calvarial fracture or suspicious bone lesion. Skull base is unremarkable. Sinuses/Orbits: Unremarkable. Other: None.  CT CERVICAL SPINE FINDINGS Alignment: Normal. Skull base and vertebrae: No acute fracture. Normal craniocervical junction. No suspicious bone lesions. Soft tissues and spinal canal: No prevertebral fluid or swelling. No visible canal hematoma. Disc levels: Mild cervical spondylosis without high-grade spinal canal stenosis. Upper chest: Unremarkable. Other: Atherosclerotic calcifications of the carotid bulbs. IMPRESSION: 1. No acute intracranial process. 2. No acute cervical spine fracture or traumatic malalignment. Electronically Signed   By: Emmit Alexanders M.D.   On: 02/16/2023 13:03   DG Chest Portable 1 View  Result Date: 02/16/2023 CLINICAL DATA:  Unwitnessed fall EXAM: PORTABLE CHEST 1 VIEW COMPARISON:  Chest radiograph dated 04/28/2022 FINDINGS: Normal lung volumes. Left basilar patchy opacity. No pleural effusion or pneumothorax. The heart size and mediastinal contours are within normal limits. No radiographic finding of acute displaced fracture. IMPRESSION: 1. Left basilar patchy opacity, likely atelectasis. 2. No radiographic finding of acute displaced fracture. Electronically Signed   By: Darrin Nipper M.D.   On: 02/16/2023 12:06   DG Hip Unilat With Pelvis 2-3 Views Left  Result Date: 02/16/2023 CLINICAL DATA:  Unwitnessed fall with left upper leg pain EXAM: DG HIP (WITH OR WITHOUT PELVIS) 2V LEFT COMPARISON:  Left hip radiographs dated 11/04/2018 FINDINGS: Mildly angulated intertrochanteric left femoral fracture. The femoroacetabular joint is maintained. Degenerative changes of the bilateral hips and partially imaged lumbar spine. Otherwise no acute displaced pelvic fracture. Vascular calcifications. IMPRESSION: Mildly angulated intertrochanteric left femoral fracture. Electronically Signed   By: Darrin Nipper M.D.   On: 02/16/2023 12:05    My interpretation of Electrocardiogram: Poor quality EKG but suggests sinus rhythm with PACs.  Unable to check intervals due to poor quality EKG.  EKG will be  repeated.   Problem List  Principal Problem:   Closed left hip fracture Active Problems:   HTN (hypertension)   Hypokalemia   UTI (urinary tract infection)   Hypomagnesemia   Assessment: 89 year old Caucasian female with past medical history as stated earlier who comes in after she was found on the floor of her house by family members.  She is found to have left hip fracture.  She has had worsening confusion over the past 1 to 2 weeks which is thought to be as a result of urinary tract infection.  She is noted to be encephalopathic.  Does not have any focal neurological deficits.  Plan: #1. Left hip fracture: Likely secondary to fall.  Unclear if she had any loss of consciousness.  She does not have any focal neurological deficits currently.  Orthopedics has been consulted.  Pain control.  #2. Preoperative evaluation: Based on the [place] Perioperative Risk Index the patient's estimated risk probability for perioperative myocardial infarction or cardiac arrest is 0.46%. Patient's procedure is moderate risk. Patient's functional capacity is moderate. Based on the AHA/ACC algorithms patient may proceed to surgery without further testing.  #3. Acute encephalopathy: Most likely secondary to urinary tract infection.  CT head did not show any acute intracranial findings.  Does not have any focal neurological deficits on examination.  Hopefully her mentation will improve with treatment of infection and hydration.  #4. Urinary tract infection: UA is pending.  She was started on ciprofloxacin by outpatient providers.  She will be given ceftriaxone here in the hospital.  Follow-up on the UA.  #5. Dehydration with hypokalemia and hypomagnesemia/mild rhabdo: This is also in the setting of HCTZ at home.  Correct electrolytes.  Give IV fluids.  CK is noted to be mildly elevated.  We will recheck CK tomorrow.  Recheck her potassium this afternoon.  Recheck magnesium level tomorrow morning.  #5. Essential  hypertension: Patient was on lisinopril-HCTZ prior to admission. Blood pressure is poorly controlled.  May not have been compliant with her antihypertensives.  Hydralazine as needed for now.  #6. Leukocytosis: Could be due to UTI.  Could be reactive.  Will recheck labs tomorrow.   DVT Prophylaxis: SCDs for now Code Status: Full code Family Communication: Discussed with son Disposition: To be determined Consults called: Orthopedics Admission Status: Status is: Inpatient Remains inpatient appropriate because: Left hip fracture    Severity of Illness: The appropriate patient status for this patient is INPATIENT. Inpatient status is judged to be reasonable and necessary in order to provide the required intensity of service to ensure the patient's safety. The patient's presenting symptoms, physical exam findings, and initial radiographic and laboratory data in the context of their chronic comorbidities is felt to place them at high risk for further clinical deterioration. Furthermore, it is not anticipated that the patient will be medically stable for discharge from the hospital within 2 midnights of admission.   * I certify that at the point of admission it is my clinical judgment that the patient will require inpatient hospital care spanning beyond 2 midnights from the point of admission due to high intensity of service, high risk for further deterioration and high frequency of surveillance required.*   Further management decisions will depend on results of further testing and patient's response to treatment.  Jerritt Cardoza [employer]  [facility] operational officer on [email or web address]  02/16/2023, 2:24 PM

## 2023-02-16 NOTE — Progress Notes (Signed)
Patient has redness to left hip and left elbow.

## 2023-02-16 NOTE — ED Triage Notes (Signed)
Pt in from home via [facility], per report the pt had a recent dx of UTI, with x 1 Cipro po taken last night, pt lives alone and family checked on her last night at 9:30pm, pt appeared to be sleeping and was waken up by her family, this morning the pt was found on the floor with unknown downtime, pt does not take blood thinners, pt c/o L upper leg pain, L elbow pain and skin tear, & R pelvic pain per report, large contusion noted on L hip, no obvious deformity or shortening on L leg, A&O x4

## 2023-02-17 DIAGNOSIS — S72002A Fracture of unspecified part of neck of left femur, initial encounter for closed fracture: Secondary | ICD-10-CM | POA: Diagnosis not present

## 2023-02-17 LAB — URINALYSIS, W/ REFLEX TO CULTURE (INFECTION SUSPECTED)
Bacteria, UA: NONE SEEN
Bilirubin Urine: NEGATIVE
Glucose, UA: NEGATIVE mg/dL
Ketones, ur: NEGATIVE mg/dL
Leukocytes,Ua: NEGATIVE
Nitrite: NEGATIVE
Protein, ur: NEGATIVE mg/dL
Specific Gravity, Urine: 1.005 (ref 1.005–1.030)
pH: 7 (ref 5.0–8.0)

## 2023-02-17 LAB — BASIC METABOLIC PANEL
Anion gap: 9 (ref 5–15)
BUN: 10 mg/dL (ref 8–23)
CO2: 26 mmol/L (ref 22–32)
Calcium: 8.4 mg/dL — ABNORMAL LOW (ref 8.9–10.3)
Chloride: 103 mmol/L (ref 98–111)
Creatinine, Ser: 0.63 mg/dL (ref 0.44–1.00)
GFR, Estimated: >60 mL/min (ref >60)
Glucose, Bld: 110 mg/dL — ABNORMAL HIGH (ref 70–99)
Potassium: 3.6 mmol/L (ref 3.5–5.1)
Sodium: 138 mmol/L (ref 135–145)

## 2023-02-17 LAB — MAGNESIUM: Magnesium: 2.1 mg/dL (ref 1.7–2.4)

## 2023-02-17 LAB — CBC
HCT: 33.1 % — ABNORMAL LOW (ref 36.0–46.0)
Hemoglobin: 11 g/dL — ABNORMAL LOW (ref 12.0–15.0)
MCH: 32 pg (ref 26.0–34.0)
MCHC: 33.2 g/dL (ref 30.0–36.0)
MCV: 96.2 fL (ref 80.0–100.0)
Platelets: 286 10*3/uL (ref 150–400)
RBC: 3.44 MIL/uL — ABNORMAL LOW (ref 3.87–5.11)
RDW: 13.2 % (ref 11.5–15.5)
WBC: 15.2 10*3/uL — ABNORMAL HIGH (ref 4.0–10.5)
nRBC: 0 % (ref 0.0–0.2)

## 2023-02-17 LAB — CK: Total CK: 430 U/L — ABNORMAL HIGH (ref 38–234)

## 2023-02-17 MED ORDER — POLYETHYLENE GLYCOL 3350 17 G PO PACK
17.0000 g | PACK | Freq: Every day | ORAL | Status: DC
  Administered 2023-02-19 – 2023-02-21 (×3): 17 g via ORAL
  Filled 2023-02-17 (×4): qty 1

## 2023-02-17 MED ORDER — OCUVITE-LUTEIN PO CAPS
1.0000 | ORAL_CAPSULE | Freq: Every day | ORAL | Status: DC
  Administered 2023-02-17 – 2023-02-21 (×4): 1 via ORAL
  Filled 2023-02-17 (×4): qty 1

## 2023-02-17 MED ORDER — BOOST PLUS PO LIQD
237.0000 mL | Freq: Every day | ORAL | Status: DC
  Administered 2023-02-17 – 2023-02-18 (×2): 237 mL via ORAL

## 2023-02-17 NOTE — Progress Notes (Signed)
  Transition of Care [facility]) Screening Note   Patient Details  Name: Wanda [name] Date of Birth: 23-Jan-1933   Transition [facility]) CM/SW Contact:    Iona Beard, [place] Phone Number: 02/17/2023, 10:08 AM  Pt to have surgery tomorrow. PT/OT will evaluate pt. TOC to follow for PT/OT recommendations.   Transition of Care Department [facility]) has reviewed patient and no TOC needs have been identified at this time. We will continue to monitor patient advancement through interdisciplinary progression rounds. If new patient transition needs arise, please place a TOC consult.

## 2023-02-17 NOTE — Consult Note (Signed)
ORTHOPAEDIC CONSULTATION  REQUESTING PHYSICIAN: Shelly Coss, MD  ASSESSMENT AND PLAN: 89 y.o. female with the following: Left Hip Intertrochanteric femur fracture  This patient requires inpatient admission to the hospitalist, to include preoperative clearance and perioperative medical management  - Weight Bearing Status/Activity: NWB Left lower extremity  - Additional recommended labs/tests: Preop Labs: CBC, BMP, PT/INR, Chest XR, and EKG  -VTE Prophylaxis: Please hold prior to OR; to resume POD#1 at the discretion of the primary team  - Pain control: Recommend PO pain medications PRN; judicious use of narcotics  - Follow-up plan: F/u 10-14 days postop  -Procedures: Plan for OR once patient has been medically optimized; 02/18/2023, NPO at midnight  Plan for Left Hip Cephalomedullary nail     Chief Complaint: Left hip pain  HPI: Wanda [name] is a 89 y.o. female who presented to the ED for evaluation after sustaining a mechanical fall.  She states.  She was found down.  Unclear how long she was down.  She is complaining of pain in her left hip, and some discomfort in her left elbow.  She states she is fallen multiple times recently.  Denies numbness and tingling.  Becomes more painful when she moves.  She is comfortable when she is lying still.  She denied her head.  She is not taking an anticoagulant.  Past Medical History:  Diagnosis Date   Anemia    Cystocele    Diverticulosis 10/20/2010   Left-sided on colonoscopy by Dr. Gala Romney 10/20/10   Gastric polyps 10/02/2013   Gastritis 12/08/2010   EGD by Dr. 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 Aline Brochure currently   Sleep apnea    STOP  BANG SCORE 4   Urge incontinence    Vitamin D deficiency    Past Surgical History:  Procedure Laterality Date   ABDOMINAL HYSTERECTOMY     ANTERIOR AND POSTERIOR REPAIR  05/17/2012   Procedure: ANTERIOR (CYSTOCELE) AND POSTERIOR REPAIR (RECTOCELE);  Surgeon: Jonnie Kind, MD;  Location: [facility];  Service: Gynecology;  Laterality: N/A;   BIOPSY  11/22/2018   Procedure: BIOPSY;  Surgeon: Daneil Dolin, MD;  Location: [facility];  Service: Endoscopy;;   BIOPSY  02/12/2020   Procedure: BIOPSY;  Surgeon: Daneil Dolin, MD;  Location: [facility];  Service: Endoscopy;;  gastric biopsy   CATARACT EXTRACTION, BILATERAL     COLONOSCOPY  02/29/2012   Dr. Gala Romney: colonic diverticulosis, minimal internal hemorrhoids, benign polyp   ESOPHAGOGASTRODUODENOSCOPY  02/29/2012   Dr. Gala Romney: atonic esophagus, moderate-sized hiatal hernia, fundal gland polyps   ESOPHAGOGASTRODUODENOSCOPY N/A 10/02/2013   Dr. Gala Romney- multiple Wilhemena Durie gastric polyps- not manipulated   ESOPHAGOGASTRODUODENOSCOPY N/A 01/14/2015   Dr. Gala Romney: Large hiatal hernia, otherwise normal EGD   ESOPHAGOGASTRODUODENOSCOPY (EGD) WITH PROPOFOL N/A 11/22/2018   Dr. Gala Romney: normal esophagus, large hiatal hernia, erosive gastropathy s/p biopsy, normal duodenum. Reactive gastropathy   ESOPHAGOGASTRODUODENOSCOPY (EGD) WITH PROPOFOL N/A 02/12/2020   normal esophagus, large hiatal hernia, [name] lesions, benign gastric biopsy   ESOPHAGOGASTRODUODENOSCOPY (EGD) WITH PROPOFOL N/A 12/26/2020   Procedure: ESOPHAGOGASTRODUODENOSCOPY (EGD) WITH PROPOFOL;  Surgeon: Daneil Dolin, MD;  [facility]: [facility];  Service: Endoscopy;  Laterality: N/A;  ESOPHAGOGASTRODUODENOSCOPY (EGD) WITH PROPOFOL N/A 03/23/2021   Procedure: ESOPHAGOGASTRODUODENOSCOPY (EGD) WITH PROPOFOL;  Surgeon: Harvel Quale, MD;  Location: [facility];  Service: Gastroenterology;  Laterality: N/A;   GIVENS CAPSULE STUDY  02/29/2012   No explanation for IDA.  Possible extrinsic compression but negative CT.    GIVENS CAPSULE STUDY N/A 12/26/2020   Procedure: GIVENS CAPSULE STUDY;  Surgeon: Daneil Dolin, MD;  Location: [facility];  Service: Endoscopy;  Laterality: N/A;   INSERTION OF MESH N/A 07/08/2021   Procedure: INSERTION OF MESH;  Surgeon: Ralene Ok, MD;  Location: [facility];  Service: General;  Laterality: N/A;   SALPINGOOPHORECTOMY  05/17/2012   Procedure: SALPINGO OOPHERECTOMY;  Surgeon: Jonnie Kind, MD;  Location: [facility];  Service: Gynecology;  Laterality: Bilateral;   VAGINAL HYSTERECTOMY  05/17/2012   Procedure: HYSTERECTOMY VAGINAL;  Surgeon: Jonnie Kind, MD;  Location: [facility];  Service: Gynecology;  Laterality: N/A;   XI ROBOTIC ASSISTED HIATAL HERNIA REPAIR N/A 07/08/2021   Procedure: XI ROBOTIC ASSISTED HIATAL HERNIA REPAIR WITH MESH AND FUNDOPLICATION;  Surgeon: Ralene Ok, MD;  Location: [facility];  Service: General;  Laterality: N/A;   Social History   Socioeconomic History   Marital status: Widowed    Spouse name: Not on file   Number of children: Not on file   Years of education: Not on file   Highest education level: Not on file  Occupational History   Occupation: Retired    Fish farm manager: RETIRED  Tobacco Use   Smoking status: Never   Smokeless tobacco: Never   Tobacco comments:    Never smoked  Vaping Use   Vaping Use: Never used  Substance and Sexual Activity   Alcohol use: No    Alcohol/week: 0.0 standard drinks of alcohol   Drug use: No   Sexual activity: Not Currently    Birth control/protection: Surgical  Other Topics Concern   Not on file  Social History Narrative   Not on file   Social Determinants of Health   Financial Resource Strain: Not on file  Food Insecurity: Not on file  Transportation Needs: Not on file  Physical Activity: Not on file  Stress: Not on file  Social Connections: Not on file   Family History  Problem Relation Age of Onset   Hypertension Mother    Lymphoma Brother     Anesthesia problems Neg Hx    Hypotension Neg Hx    Malignant hyperthermia Neg Hx    Pseudochol deficiency Neg Hx    Colon cancer Neg Hx    Allergies  Allergen Reactions   Sulfa Antibiotics     unknown   Naproxen Sodium Rash and Other (See Comments)    Passed out   Prior to Admission medications   Medication Sig Start Date End Date Taking? Authorizing Provider  calcium citrate-vitamin D 500-400 MG-UNIT chewable tablet Chew 2 tablets by mouth daily.   Yes [provider]  CIPRO 250 MG tablet Take 250 mg by mouth 2 (two) times daily. 02/15/23  Yes [provider]  cyanocobalamin (,VITAMIN B-12,) 1000 MCG/ML injection Inject 1,000 mcg into the muscle every 30 (thirty) days. 01/10/21  Yes [provider]  lisinopril-hydrochlorothiazide (ZESTORETIC) 20-12.5 MG tablet Take 1 tablet by mouth daily. 02/11/21  Yes [provider]  oxyCODONE-acetaminophen (PERCOCET) 5-325 MG tablet Take 1 tablet by mouth every 6 (six) hours as needed. Patient not taking: Reported on 04/28/2022 03/19/22   Noemi Chapel, MD   CT HEAD  WO CONTRAST  Result Date: 02/16/2023 CLINICAL DATA:  Found down.  Trauma. EXAM: CT HEAD WITHOUT CONTRAST CT CERVICAL SPINE WITHOUT CONTRAST TECHNIQUE: Multidetector CT imaging of the head and cervical spine was performed following the standard protocol without intravenous contrast. Multiplanar CT image reconstructions of the cervical spine were also generated. RADIATION DOSE REDUCTION: This exam was performed according to the departmental dose-optimization program which includes automated exposure control, adjustment of the mA and/or kV according to patient size and/or use of iterative reconstruction technique. COMPARISON:  None Available. FINDINGS: CT HEAD FINDINGS Brain: No acute intracranial hemorrhage. Mild chronic small-vessel disease. Gray-white differentiation is preserved. No hydrocephalus or extra-axial collection. No mass effect or midline shift.  Vascular: No hyperdense vessel or unexpected calcification. Skull: No calvarial fracture or suspicious bone lesion. Skull base is unremarkable. Sinuses/Orbits: Unremarkable. Other: None. CT CERVICAL SPINE FINDINGS Alignment: Normal. Skull base and vertebrae: No acute fracture. Normal craniocervical junction. No suspicious bone lesions. Soft tissues and spinal canal: No prevertebral fluid or swelling. No visible canal hematoma. Disc levels: Mild cervical spondylosis without high-grade spinal canal stenosis. Upper chest: Unremarkable. Other: Atherosclerotic calcifications of the carotid bulbs. IMPRESSION: 1. No acute intracranial process. 2. No acute cervical spine fracture or traumatic malalignment. Electronically Signed   By: Emmit Alexanders M.D.   On: 02/16/2023 13:03   CT CERVICAL SPINE WO CONTRAST  Result Date: 02/16/2023 CLINICAL DATA:  Found down.  Trauma. EXAM: CT HEAD WITHOUT CONTRAST CT CERVICAL SPINE WITHOUT CONTRAST TECHNIQUE: Multidetector CT imaging of the head and cervical spine was performed following the standard protocol without intravenous contrast. Multiplanar CT image reconstructions of the cervical spine were also generated. RADIATION DOSE REDUCTION: This exam was performed according to the departmental dose-optimization program which includes automated exposure control, adjustment of the mA and/or kV according to patient size and/or use of iterative reconstruction technique. COMPARISON:  None Available. FINDINGS: CT HEAD FINDINGS Brain: No acute intracranial hemorrhage. Mild chronic small-vessel disease. Gray-white differentiation is preserved. No hydrocephalus or extra-axial collection. No mass effect or midline shift. Vascular: No hyperdense vessel or unexpected calcification. Skull: No calvarial fracture or suspicious bone lesion. Skull base is unremarkable. Sinuses/Orbits: Unremarkable. Other: None. CT CERVICAL SPINE FINDINGS Alignment: Normal. Skull base and vertebrae: No acute fracture.  Normal craniocervical junction. No suspicious bone lesions. Soft tissues and spinal canal: No prevertebral fluid or swelling. No visible canal hematoma. Disc levels: Mild cervical spondylosis without high-grade spinal canal stenosis. Upper chest: Unremarkable. Other: Atherosclerotic calcifications of the carotid bulbs. IMPRESSION: 1. No acute intracranial process. 2. No acute cervical spine fracture or traumatic malalignment. Electronically Signed   By: Emmit Alexanders M.D.   On: 02/16/2023 13:03   DG Chest Portable 1 View  Result Date: 02/16/2023 CLINICAL DATA:  Unwitnessed fall EXAM: PORTABLE CHEST 1 VIEW COMPARISON:  Chest radiograph dated 04/28/2022 FINDINGS: Normal lung volumes. Left basilar patchy opacity. No pleural effusion or pneumothorax. The heart size and mediastinal contours are within normal limits. No radiographic finding of acute displaced fracture. IMPRESSION: 1. Left basilar patchy opacity, likely atelectasis. 2. No radiographic finding of acute displaced fracture. Electronically Signed   By: Darrin Nipper M.D.   On: 02/16/2023 12:06   DG Hip Unilat With Pelvis 2-3 Views Left  Result Date: 02/16/2023 CLINICAL DATA:  Unwitnessed fall with left upper leg pain EXAM: DG HIP (WITH OR WITHOUT PELVIS) 2V LEFT COMPARISON:  Left hip radiographs dated 11/04/2018 FINDINGS: Mildly angulated intertrochanteric left femoral fracture. The femoroacetabular joint is maintained. Degenerative changes of the  bilateral hips and partially imaged lumbar spine. Otherwise no acute displaced pelvic fracture. Vascular calcifications. IMPRESSION: Mildly angulated intertrochanteric left femoral fracture. Electronically Signed   By: Darrin Nipper M.D.   On: 02/16/2023 12:05   Family History Reviewed and non-contributory, no pertinent history of problems with bleeding or anesthesia    Review of Systems No fevers or chills No numbness or tingling No chest pain No shortness of breath No bowel or bladder dysfunction No GI  distress No headaches    OBJECTIVE  Vitals:Patient Vitals for the past 8 hrs:  BP Temp Temp src Pulse Resp SpO2  02/17/23 0444 (!) 141/68 98.6 F (37 C) Oral 77 16 97 %   General: Alert, no acute distress Cardiovascular: Extremities are warm Respiratory: No cyanosis, no use of accessory musculature Skin: No lesions in the area of chief complaint  Neurologic: Sensation intact distally  Psychiatric: Patient is competent for consent with normal mood and affect Lymphatic: No swelling obvious and reported other than the area involved in the exam below Extremities  LLE: Extremity held in a fixed position.  ROM deferred due to known fracture.  Sensation is intact distally in the sural, saphenous, DP, SP, and plantar nerve distribution. 2+ DP pulse.  Toes are WWP.  Active motion intact in the TA/EHL/GS. RLE: Sensation is intact distally in the sural, saphenous, DP, SP, and plantar nerve distribution. 2+ DP pulse.  Toes are WWP.  Active motion intact in the TA/EHL/GS. Tolerates gentle ROM of the hip.  No pain with axial loading.    Bruising to the left elbow, but she is able to demonstrate full motion of the elbow.   Test Results Imaging XR of the Left hip demonstrates a comminuted Intertrochanteric femur fracture.  Labs cbc Recent Labs    02/16/23 1206 02/16/23 1217 02/17/23 0357  WBC 16.7*  --  15.2*  HGB 13.2 14.6 11.0*  HCT 38.8 43.0 33.1*  PLT 327  --  286    Labs coag Recent Labs    02/16/23 1206  INR 1.1    Recent Labs    02/16/23 1206 02/16/23 1217 02/16/23 1839 02/17/23 0357  NA 136 139  --  138  K 2.5* 2.6* 3.0* 3.6  CL 97* 97*  --  103  CO2 25  --   --  26  GLUCOSE 156* 157*  --  110*  BUN 13 13  --  10  CREATININE 0.66 0.50  --  0.63  CALCIUM 9.0  --   --  8.4*

## 2023-02-17 NOTE — Progress Notes (Signed)
PROGRESS NOTE  Wanda [name]  [id number] DOB: June 10, 1933 DOA: 02/16/2023 PCP: Renee Rival, NP   Brief Narrative:  Patient is a 89 year old female with history of hypertension, frequent UTI, vitamin B12 deficiency who was brought out by her family here after she was found on the floor, appeared to be lethargic/fatigued, more confused.  Patient was recently seen by a provider via televisit, suspected to have UTI and was given ciprofloxacin.  On presentation she was pleasantly confused.  History was taken from family.  She was hemodynamically stable.  Lab work showed potassium of 2.5, magnesium of 1.6, CK of 665, WBC of 16.7.  UA was not that reassuring for UTI. X-ray of the hip showed mildly angulated intertrochanteric left femoral fracture.  CT head/cervical spine did not show any fracture or dislocation.  Orthopedics consulted.  Also started on antibiotic for possible UTI, urine culture sent today    Assessment & Plan:  Principal Problem:   Closed left hip fracture Active Problems:   HTN (hypertension)   Hypokalemia   UTI (urinary tract infection)   Hypomagnesemia   Fall/left hip fracture: Found on the floor.X-ray of the hip showed mildly angulated intertrochanteric left femoral fracture.  Orthopedics consulted,plan for ORIF tomorrow.  Patient has minimal to moderate risks for operative procedure, may proceed with surgery without further testing.   Continue pain management, supportive care.  Needs PT/OT consultation after surgery.  Acute encephalopathy/UTI: Patient has history of recurrent UTI.  CT head did not show any acute findings.  No focal neurological deficits on examination.  Was recently started on antibiotic as an outpatient for suspected UTI.   UA was not reassuring, urine culture will be sent.  Continue ceftriaxone for now. Currently she is fully alert and oriented, denies any dysuria. Continue frequent reorientation, delirium precautions  Severe  hypokalemia/hypomagnesemia: Supplemented and corrected.  Takes hydrochlorothiazide at home.  Continue gentle IV fluids.  Elevated CK: Likely secondary to fall with mild rhabdo.  Started on IV fluids.  Hypertension: On lisinopril, hydrochlorothiazide at home.  Monitor blood pressure.  Continue  as needed medications for now  Leukocytosis: Could be from UTI or could be reactive.  Continue to monitor.  Afebrile          DVT prophylaxis:SCDs Start: 02/16/23 1420     Code Status: Full Code  Family Communication: Son at bedside  Patient status:Inpatient  Patient is from :Home  Anticipated discharge to:SNF  Estimated DC date:not sure   Consultants: Orthopedics  Procedures:None yet  Antimicrobials:  Anti-infectives (From admission, onward)    Start     Dose/Rate Route Frequency Ordered Stop   02/18/23 0600  ceFAZolin (ANCEF) IVPB 2g/100 mL premix        2 g 200 mL/hr over 30 Minutes Intravenous On call to O.R. 02/16/23 1313 02/19/23 0559   02/16/23 1430  cefTRIAXone (ROCEPHIN) 1 g in sodium chloride 0.9 % 100 mL IVPB        1 g 200 mL/hr over 30 Minutes Intravenous Every 24 hours 02/16/23 1423 02/21/23 1959       Subjective: Patient seen and examined at bedside today.  Hemodynamically stable.  Fully alert and oriented.  Obeys commands.  Son at the bedside.  Does not complain of any pain in the left hip while in bed.  Objective: Vitals:   02/16/23 1535 02/16/23 1953 02/17/23 0000 02/17/23 0444  BP: (!) 185/86 (!) 158/70 (!) 149/76 (!) 141/68  Pulse: 100 96 89 77  Resp: 19 18 18  16  Temp: 98.1 F (36.7 C) 98.6 F (37 C) 98.8 F (37.1 C) 98.6 F (37 C)  TempSrc: Oral Oral Oral Oral  SpO2: 98% 96% 97% 97%  Weight:      Height:        Intake/Output Summary (Last 24 hours) at 02/17/2023 0816 Last data filed at 02/17/2023 0450 Gross per 24 hour  Intake 2789.31 ml  Output 900 ml  Net 1889.31 ml   Filed Weights   02/16/23 1140  Weight: 54.4 kg     Examination:  General exam: Overall comfortable, not in distress HEENT: PERRL Respiratory system:  no wheezes or crackles  Cardiovascular system: S1 & S2 heard, RRR.  Gastrointestinal system: Abdomen is nondistended, soft and nontender. Central nervous system: Alert and oriented Extremities: No edema, no clubbing ,no cyanosis,tenderness on the left hip, decreased range of motion of left lower extremity Skin: No rashes, no ulcers,no icterus     Data Reviewed: I have personally reviewed following labs and imaging studies  CBC: Recent Labs  Lab 02/16/23 1206 02/16/23 1217 02/17/23 0357  WBC 16.7*  --  15.2*  NEUTROABS 15.4*  --   --   HGB 13.2 14.6 11.0*  HCT 38.8 43.0 33.1*  MCV 93.9  --  96.2  PLT 327  --  [id number]   Basic Metabolic Panel: Recent Labs  Lab 02/16/23 1206 02/16/23 1217 02/16/23 1839 02/17/23 0357  NA 136 139  --  138  K 2.5* 2.6* 3.0* 3.6  CL 97* 97*  --  103  CO2 25  --   --  26  GLUCOSE 156* 157*  --  110*  BUN 13 13  --  10  CREATININE 0.66 0.50  --  0.63  CALCIUM 9.0  --   --  8.4*  MG 1.6*  --   --  2.1     No results found for this or any previous visit (from the past 240 hour(s)).   Radiology Studies: CT HEAD WO CONTRAST  Result Date: 02/16/2023 CLINICAL DATA:  Found down.  Trauma. EXAM: CT HEAD WITHOUT CONTRAST CT CERVICAL SPINE WITHOUT CONTRAST TECHNIQUE: Multidetector CT imaging of the head and cervical spine was performed following the standard protocol without intravenous contrast. Multiplanar CT image reconstructions of the cervical spine were also generated. RADIATION DOSE REDUCTION: This exam was performed according to the departmental dose-optimization program which includes automated exposure control, adjustment of the mA and/or kV according to patient size and/or use of iterative reconstruction technique. COMPARISON:  None Available. FINDINGS: CT HEAD FINDINGS Brain: No acute intracranial hemorrhage. Mild chronic small-vessel disease.  Gray-white differentiation is preserved. No hydrocephalus or extra-axial collection. No mass effect or midline shift. Vascular: No hyperdense vessel or unexpected calcification. Skull: No calvarial fracture or suspicious bone lesion. Skull base is unremarkable. Sinuses/Orbits: Unremarkable. Other: None. CT CERVICAL SPINE FINDINGS Alignment: Normal. Skull base and vertebrae: No acute fracture. Normal craniocervical junction. No suspicious bone lesions. Soft tissues and spinal canal: No prevertebral fluid or swelling. No visible canal hematoma. Disc levels: Mild cervical spondylosis without high-grade spinal canal stenosis. Upper chest: Unremarkable. Other: Atherosclerotic calcifications of the carotid bulbs. IMPRESSION: 1. No acute intracranial process. 2. No acute cervical spine fracture or traumatic malalignment. Electronically Signed   By: Emmit Alexanders M.D.   On: 02/16/2023 13:03   CT CERVICAL SPINE WO CONTRAST  Result Date: 02/16/2023 CLINICAL DATA:  Found down.  Trauma. EXAM: CT HEAD WITHOUT CONTRAST CT CERVICAL SPINE WITHOUT CONTRAST TECHNIQUE: Multidetector CT imaging  of the head and cervical spine was performed following the standard protocol without intravenous contrast. Multiplanar CT image reconstructions of the cervical spine were also generated. RADIATION DOSE REDUCTION: This exam was performed according to the departmental dose-optimization program which includes automated exposure control, adjustment of the mA and/or kV according to patient size and/or use of iterative reconstruction technique. COMPARISON:  None Available. FINDINGS: CT HEAD FINDINGS Brain: No acute intracranial hemorrhage. Mild chronic small-vessel disease. Gray-white differentiation is preserved. No hydrocephalus or extra-axial collection. No mass effect or midline shift. Vascular: No hyperdense vessel or unexpected calcification. Skull: No calvarial fracture or suspicious bone lesion. Skull base is unremarkable. Sinuses/Orbits:  Unremarkable. Other: None. CT CERVICAL SPINE FINDINGS Alignment: Normal. Skull base and vertebrae: No acute fracture. Normal craniocervical junction. No suspicious bone lesions. Soft tissues and spinal canal: No prevertebral fluid or swelling. No visible canal hematoma. Disc levels: Mild cervical spondylosis without high-grade spinal canal stenosis. Upper chest: Unremarkable. Other: Atherosclerotic calcifications of the carotid bulbs. IMPRESSION: 1. No acute intracranial process. 2. No acute cervical spine fracture or traumatic malalignment. Electronically Signed   By: Emmit Alexanders M.D.   On: 02/16/2023 13:03   DG Chest Portable 1 View  Result Date: 02/16/2023 CLINICAL DATA:  Unwitnessed fall EXAM: PORTABLE CHEST 1 VIEW COMPARISON:  Chest radiograph dated 04/28/2022 FINDINGS: Normal lung volumes. Left basilar patchy opacity. No pleural effusion or pneumothorax. The heart size and mediastinal contours are within normal limits. No radiographic finding of acute displaced fracture. IMPRESSION: 1. Left basilar patchy opacity, likely atelectasis. 2. No radiographic finding of acute displaced fracture. Electronically Signed   By: Darrin Nipper M.D.   On: 02/16/2023 12:06   DG Hip Unilat With Pelvis 2-3 Views Left  Result Date: 02/16/2023 CLINICAL DATA:  Unwitnessed fall with left upper leg pain EXAM: DG HIP (WITH OR WITHOUT PELVIS) 2V LEFT COMPARISON:  Left hip radiographs dated 11/04/2018 FINDINGS: Mildly angulated intertrochanteric left femoral fracture. The femoroacetabular joint is maintained. Degenerative changes of the bilateral hips and partially imaged lumbar spine. Otherwise no acute displaced pelvic fracture. Vascular calcifications. IMPRESSION: Mildly angulated intertrochanteric left femoral fracture. Electronically Signed   By: Darrin Nipper M.D.   On: 02/16/2023 12:05    Scheduled Meds:  senna  1 tablet Oral BID   Continuous Infusions:  0.45 % NaCl with KCl 20 mEq / L 100 mL/hr at 02/17/23 0450    [START ON 02/18/2023]  ceFAZolin (ANCEF) IV     cefTRIAXone (ROCEPHIN)  IV Stopped (02/16/23 2105)   [START ON 02/18/2023] tranexamic acid       LOS: 1 day   Shelly Coss, MD [facility] P4/01/2023, 8:16 AM

## 2023-02-17 NOTE — Plan of Care (Signed)
  Problem: Health Behavior/Discharge Planning: Goal: Ability to manage health-related needs will improve Outcome: Not Progressing   

## 2023-02-17 NOTE — Progress Notes (Signed)
Initial Nutrition Assessment  DOCUMENTATION CODES:      INTERVENTION:  Boost Plus daily  [place] yogurt with breakfast   Multivitamin daily  NUTRITION DIAGNOSIS:   Increased nutrient needs related to post-op healing as evidenced by estimated needs.   GOAL:  Patient will meet greater than or equal to 90% of their needs  MONITOR:  PO intake, Labs, Skin  REASON FOR ASSESSMENT:   Consult Assessment of nutrition requirement/status  ASSESSMENT: Patient presents following a fall. PMH: GERD, GIB and recent UTI.  Patient has left hip intertrochanteric femur fracture and is scheduled for left hip cephalomedullary nail in the morning per MD  Multiple family members bedside and nursing working with patient to place IV.   Patient ate >75% of lunch today. Able to feed herself. Home diet regular. She also receives Meals On Wheels (Mon->Fri.). She supplements other meals with soups, sandwiches and frozen meals and says she mainly uses the microwave to heat foods vs the stove due to safety.   Patient affirms stable weight and denies changes in appetite. Currently 54.4 kg last year in May she weighed 53.5 kg.   Medications reviewed and include: Potassium replaced.      Latest Ref Rng & Units 02/17/2023    3:57 AM 02/16/2023    6:39 PM 02/16/2023   12:17 PM  BMP  Glucose 70 - 99 mg/dL 110   157   BUN 8 - 23 mg/dL 10   13   Creatinine 0.44 - 1.00 mg/dL 0.63   0.50   Sodium 135 - 145 mmol/L 138   139   Potassium 3.5 - 5.1 mmol/L 3.6  3.0  2.6   Chloride 98 - 111 mmol/L 103   97   CO2 22 - 32 mmol/L 26     Calcium 8.9 - 10.3 mg/dL 8.4         NUTRITION - FOCUSED PHYSICAL EXAM: Unable to perform at time of initial visit.   Diet Order:   Diet Order             Diet Heart Room service appropriate? Yes; Fluid consistency: Thin  Diet effective now                   EDUCATION NEEDS:  Education needs have been addressed  Skin:  Skin Assessment: Skin Integrity Issues: Skin  Integrity Issues:: Other (Comment) Other: elbow abrasion  Last BM:  4/2  Height:   Ht Readings from Last 1 Encounters:  02/16/23 4\' 8"  (1.422 m)    Weight:   Wt Readings from Last 1 Encounters:  02/16/23 54.4 kg    Ideal Body Weight:   45 kg  BMI:  Body mass index is 26.9 kg/m.  Estimated Nutritional Needs:   Kcal:  1500-1700  Protein:  75-80 gr  Fluid:  1500 ml daily   Colman Cater MS,RD,CSG,LDN Contact: Shea Evans

## 2023-02-18 ENCOUNTER — Inpatient Hospital Stay (HOSPITAL_COMMUNITY): Payer: Medicare Other

## 2023-02-18 ENCOUNTER — Encounter (HOSPITAL_COMMUNITY): Payer: Self-pay | Admitting: Internal Medicine

## 2023-02-18 ENCOUNTER — Encounter (HOSPITAL_COMMUNITY)
Admission: EM | Disposition: A | Discharge status: Skilled Nursing Facility | Payer: Self-pay | Source: Home / Self Care | Attending: Internal Medicine

## 2023-02-18 ENCOUNTER — Other Ambulatory Visit: Payer: Self-pay

## 2023-02-18 DIAGNOSIS — S72142A Displaced intertrochanteric fracture of left femur, initial encounter for closed fracture: Secondary | ICD-10-CM

## 2023-02-18 DIAGNOSIS — S72002A Fracture of unspecified part of neck of left femur, initial encounter for closed fracture: Secondary | ICD-10-CM | POA: Diagnosis not present

## 2023-02-18 HISTORY — PX: INTRAMEDULLARY (IM) NAIL INTERTROCHANTERIC: SHX5875

## 2023-02-18 LAB — BASIC METABOLIC PANEL
Anion gap: 9 (ref 5–15)
BUN: 15 mg/dL (ref 8–23)
CO2: 24 mmol/L (ref 22–32)
Calcium: 8.1 mg/dL — ABNORMAL LOW (ref 8.9–10.3)
Chloride: 104 mmol/L (ref 98–111)
Creatinine, Ser: 0.61 mg/dL (ref 0.44–1.00)
GFR, Estimated: >60 mL/min (ref >60)
Glucose, Bld: 110 mg/dL — ABNORMAL HIGH (ref 70–99)
Potassium: 3.2 mmol/L — ABNORMAL LOW (ref 3.5–5.1)
Sodium: 137 mmol/L (ref 135–145)

## 2023-02-18 LAB — CBC
HCT: 32.2 % — ABNORMAL LOW (ref 36.0–46.0)
Hemoglobin: 10.4 g/dL — ABNORMAL LOW (ref 12.0–15.0)
MCH: 31.8 pg (ref 26.0–34.0)
MCHC: 32.3 g/dL (ref 30.0–36.0)
MCV: 98.5 fL (ref 80.0–100.0)
Platelets: 247 K/uL (ref 150–400)
RBC: 3.27 MIL/uL — ABNORMAL LOW (ref 3.87–5.11)
RDW: 13.1 % (ref 11.5–15.5)
WBC: 12.6 K/uL — ABNORMAL HIGH (ref 4.0–10.5)
nRBC: 0 % (ref 0.0–0.2)

## 2023-02-18 LAB — URINE CULTURE: Culture: NO GROWTH

## 2023-02-18 LAB — MRSA NEXT GEN BY PCR, NASAL: MRSA by PCR Next Gen: NOT DETECTED

## 2023-02-18 SURGERY — FIXATION, FRACTURE, INTERTROCHANTERIC, WITH INTRAMEDULLARY ROD
Anesthesia: General | Site: Hip | Laterality: Left

## 2023-02-18 MED ORDER — PHENYLEPHRINE HCL-NACL 20-0.9 MG/250ML-% IV SOLN
INTRAVENOUS | Status: AC
  Filled 2023-02-18: qty 250

## 2023-02-18 MED ORDER — ORAL CARE MOUTH RINSE: 15.0000 mL | Freq: Once | OROMUCOSAL | Status: AC

## 2023-02-18 MED ORDER — SODIUM CHLORIDE 0.9 % IV SOLN: INTRAVENOUS | Status: DC | PRN

## 2023-02-18 MED ORDER — KETAMINE HCL 50 MG/5ML IJ SOSY
PREFILLED_SYRINGE | INTRAMUSCULAR | Status: AC
  Filled 2023-02-18: qty 5

## 2023-02-18 MED ORDER — POTASSIUM CHLORIDE 10 MEQ/100ML IV SOLN
10.0000 meq | INTRAVENOUS | Status: AC
  Administered 2023-02-18 (×3): 10 meq via INTRAVENOUS
  Filled 2023-02-18 (×2): qty 100

## 2023-02-18 MED ORDER — BUPIVACAINE HCL (PF) 0.5 % IJ SOLN
INTRAMUSCULAR | Status: DC | PRN
  Administered 2023-02-18: 30 mL

## 2023-02-18 MED ORDER — DEXAMETHASONE SODIUM PHOSPHATE 10 MG/ML IJ SOLN
INTRAMUSCULAR | Status: AC
  Filled 2023-02-18: qty 1

## 2023-02-18 MED ORDER — PROPOFOL 10 MG/ML IV BOLUS
INTRAVENOUS | Status: DC | PRN
  Administered 2023-02-18: 40 mg via INTRAVENOUS
  Administered 2023-02-18 (×3): 30 mg via INTRAVENOUS
  Administered 2023-02-18: 50 mg via INTRAVENOUS

## 2023-02-18 MED ORDER — DEXAMETHASONE SODIUM PHOSPHATE 10 MG/ML IJ SOLN
INTRAMUSCULAR | Status: DC | PRN
  Administered 2023-02-18: 5 mg via INTRAVENOUS

## 2023-02-18 MED ORDER — PHENYLEPHRINE 80 MCG/ML (10ML) SYRINGE FOR IV PUSH (FOR BLOOD PRESSURE SUPPORT)
PREFILLED_SYRINGE | INTRAVENOUS | Status: AC
  Filled 2023-02-18: qty 10

## 2023-02-18 MED ORDER — LACTATED RINGERS IV SOLN: INTRAVENOUS | Status: DC

## 2023-02-18 MED ORDER — CHLORHEXIDINE GLUCONATE 0.12 % MT SOLN: 15.0000 mL | Freq: Once | OROMUCOSAL | Status: AC

## 2023-02-18 MED ORDER — FENTANYL CITRATE (PF) 100 MCG/2ML IJ SOLN
INTRAMUSCULAR | Status: DC | PRN
  Administered 2023-02-18: 25 ug via INTRAVENOUS
  Administered 2023-02-18: 50 ug via INTRAVENOUS
  Administered 2023-02-18: 25 ug via INTRAVENOUS
  Administered 2023-02-18 (×2): 50 ug via INTRAVENOUS

## 2023-02-18 MED ORDER — ONDANSETRON HCL 4 MG/2ML IJ SOLN
INTRAMUSCULAR | Status: AC
  Filled 2023-02-18: qty 2

## 2023-02-18 MED ORDER — CEFAZOLIN SODIUM-DEXTROSE 2-4 GM/100ML-% IV SOLN
2.0000 g | INTRAVENOUS | Status: AC
  Administered 2023-02-18: 2 g via INTRAVENOUS
  Filled 2023-02-18: qty 100

## 2023-02-18 MED ORDER — EPHEDRINE SULFATE-NACL 50-0.9 MG/10ML-% IV SOSY
PREFILLED_SYRINGE | INTRAVENOUS | Status: DC | PRN
  Administered 2023-02-18 (×2): 10 mg via INTRAVENOUS

## 2023-02-18 MED ORDER — ONDANSETRON HCL 4 MG/2ML IJ SOLN
INTRAMUSCULAR | Status: DC | PRN
  Administered 2023-02-18: 4 mg via INTRAVENOUS

## 2023-02-18 MED ORDER — PHENYLEPHRINE 80 MCG/ML (10ML) SYRINGE FOR IV PUSH (FOR BLOOD PRESSURE SUPPORT)
PREFILLED_SYRINGE | INTRAVENOUS | Status: DC | PRN
  Administered 2023-02-18: 160 ug via INTRAVENOUS
  Administered 2023-02-18 (×2): 80 ug via INTRAVENOUS
  Administered 2023-02-18: 160 ug via INTRAVENOUS
  Administered 2023-02-18: 80 ug via INTRAVENOUS
  Administered 2023-02-18: 40 ug via INTRAVENOUS
  Administered 2023-02-18: 160 ug via INTRAVENOUS

## 2023-02-18 MED ORDER — CALCIUM GLUCONATE-NACL 1-0.675 GM/50ML-% IV SOLN
1.0000 g | Freq: Once | INTRAVENOUS | Status: AC
  Administered 2023-02-18: 1000 mg via INTRAVENOUS
  Filled 2023-02-18: qty 50

## 2023-02-18 MED ORDER — PHENYLEPHRINE HCL-NACL 20-0.9 MG/250ML-% IV SOLN
INTRAVENOUS | Status: DC | PRN
  Administered 2023-02-18: 40 ug/min via INTRAVENOUS

## 2023-02-18 MED ORDER — EPHEDRINE 5 MG/ML INJ
INTRAVENOUS | Status: AC
  Filled 2023-02-18: qty 5

## 2023-02-18 MED ORDER — FENTANYL CITRATE (PF) 100 MCG/2ML IJ SOLN
INTRAMUSCULAR | Status: AC
  Filled 2023-02-18: qty 2

## 2023-02-18 MED ORDER — CHLORHEXIDINE GLUCONATE 0.12 % MT SOLN
OROMUCOSAL | Status: AC
  Administered 2023-02-18: 15 mL via OROMUCOSAL
  Filled 2023-02-18: qty 15

## 2023-02-18 MED ORDER — KETAMINE HCL 10 MG/ML IJ SOLN
INTRAMUSCULAR | Status: DC | PRN
  Administered 2023-02-18: 20 mg via INTRAVENOUS
  Administered 2023-02-18: 10 mg via INTRAVENOUS
  Administered 2023-02-18: 20 mg via INTRAVENOUS

## 2023-02-18 MED ORDER — LIDOCAINE HCL (CARDIAC) PF 100 MG/5ML IV SOSY
PREFILLED_SYRINGE | INTRAVENOUS | Status: DC | PRN
  Administered 2023-02-18: 50 mg via INTRATRACHEAL

## 2023-02-18 MED ORDER — BUPIVACAINE HCL (PF) 0.5 % IJ SOLN
INTRAMUSCULAR | Status: AC
  Filled 2023-02-18: qty 30

## 2023-02-18 MED ORDER — SODIUM CHLORIDE 0.9 % IR SOLN
Status: DC | PRN
  Administered 2023-02-18: 1000 mL

## 2023-02-18 SURGICAL SUPPLY — 56 items
APL PRP STRL LF DISP 70% ISPRP (MISCELLANEOUS) ×1
BIT DRILL 4.0X280 (BIT) IMPLANT
BLADE SURG SZ10 CARB STEEL (BLADE) ×1 IMPLANT
BNDG GAUZE ELAST 4 BULKY (GAUZE/BANDAGES/DRESSINGS) ×1 IMPLANT
BRUSH SCRUB EZ W/ULTRADEX 3%PC (MISCELLANEOUS) IMPLANT
CHLORAPREP W/TINT 26 (MISCELLANEOUS) ×1 IMPLANT
CLOTH BEACON ORANGE TIMEOUT ST (SAFETY) ×1 IMPLANT
COVER LIGHT HANDLE STERIS (MISCELLANEOUS) ×2 IMPLANT
COVER MAYO STAND XLG (MISCELLANEOUS) ×1 IMPLANT
COVER PERINEAL POST (MISCELLANEOUS) ×1 IMPLANT
DECANTER SPIKE VIAL GLASS SM (MISCELLANEOUS) ×1 IMPLANT
DRAPE STERI IOBAN 125X83 (DRAPES) ×1 IMPLANT
DRSG MEPILEX SACRM 8.7X9.8 (GAUZE/BANDAGES/DRESSINGS) ×1 IMPLANT
DRSG PAD ABDOMINAL 8X10 ST (GAUZE/BANDAGES/DRESSINGS) ×1 IMPLANT
DRSG TEGADERM 4X4.75 (GAUZE/BANDAGES/DRESSINGS) ×4 IMPLANT
ELECT REM PT RETURN 9FT ADLT (ELECTROSURGICAL) ×1
ELECTRODE REM PT RTRN 9FT ADLT (ELECTROSURGICAL) ×1 IMPLANT
GAUZE SPONGE 4X4 12PLY STRL (GAUZE/BANDAGES/DRESSINGS) ×1 IMPLANT
GAUZE XEROFORM 1X8 LF (GAUZE/BANDAGES/DRESSINGS) ×2 IMPLANT
GLOVE BIO SURGEON STRL SZ8 (GLOVE) ×2 IMPLANT
GLOVE BIOGEL PI IND STRL 7.0 (GLOVE) ×2 IMPLANT
GLOVE SRG 8 PF TXTR STRL LF DI (GLOVE) ×1 IMPLANT
GLOVE SURG UNDER POLY LF SZ8 (GLOVE) ×1
GOWN STRL REUS W/ TWL XL LVL3 (GOWN DISPOSABLE) ×1 IMPLANT
GOWN STRL REUS W/TWL LRG LVL3 (GOWN DISPOSABLE) ×1 IMPLANT
GOWN STRL REUS W/TWL XL LVL3 (GOWN DISPOSABLE) ×1
GUIDEWIRE BALL NOSE 3.0X900 (WIRE) ×1
GUIDEWIRE ORTH 900X3XBALL NOSE (WIRE) IMPLANT
INST SET MAJOR BONE (KITS) ×1 IMPLANT
KIT BLADEGUARD II DBL (SET/KITS/TRAYS/PACK) ×1 IMPLANT
KIT TURNOVER CYSTO (KITS) ×1 IMPLANT
MANIFOLD NEPTUNE II (INSTRUMENTS) ×1 IMPLANT
MARKER SKIN DUAL TIP RULER LAB (MISCELLANEOUS) ×1 IMPLANT
NAIL LEFT 10X125X30 ES (Nail) IMPLANT
NDL HYPO 21X1.5 SAFETY (NEEDLE) ×1 IMPLANT
NEEDLE HYPO 21X1.5 SAFETY (NEEDLE) ×1 IMPLANT
NS IRRIG 1000ML POUR BTL (IV SOLUTION) ×1 IMPLANT
PACK BASIC III (CUSTOM PROCEDURE TRAY) ×1
PACK SRG BSC III STRL LF ECLPS (CUSTOM PROCEDURE TRAY) ×1 IMPLANT
PAD ARMBOARD 7.5X6 YLW CONV (MISCELLANEOUS) ×1 IMPLANT
PENCIL SMOKE EVACUATOR COATED (MISCELLANEOUS) ×1 IMPLANT
PIN GUIDE THRD AR 3.2X330 (PIN) IMPLANT
SCREW LOCK CORT 5X34 (Screw) IMPLANT
SCREW LOCK LAG FEM 10.5X90 (Screw) IMPLANT
SET BASIN LINEN APH (SET/KITS/TRAYS/PACK) ×1 IMPLANT
SPONGE T-LAP 18X18 ~~LOC~~+RFID (SPONGE) ×2 IMPLANT
STAPLER VISISTAT (STAPLE) IMPLANT
SUT MNCRL AB 4-0 PS2 18 (SUTURE) IMPLANT
SUT MON AB 2-0 CT1 36 (SUTURE) ×1 IMPLANT
SUT VIC AB 0 CT1 27 (SUTURE) ×1
SUT VIC AB 0 CT1 27XBRD ANTBC (SUTURE) ×1 IMPLANT
SYR 30ML LL (SYRINGE) ×1 IMPLANT
SYR BULB IRRIG 60ML STRL (SYRINGE) ×2 IMPLANT
TOOL ACTIVATION (INSTRUMENTS) IMPLANT
TRAY FOLEY MTR SLVR 16FR STAT (SET/KITS/TRAYS/PACK) ×1 IMPLANT
YANKAUER SUCT BULB TIP 10FT TU (MISCELLANEOUS) ×1 IMPLANT

## 2023-02-18 NOTE — Anesthesia Preprocedure Evaluation (Signed)
Anesthesia Evaluation  Patient identified by MRN, date of birth, ID band Patient awake    Reviewed: Allergy & Precautions, H&P , NPO status , Patient's Chart, lab work & pertinent test results, reviewed documented beta blocker date and time   Airway Mallampati: II  TM Distance: >3 FB Neck ROM: full    Dental no notable dental hx.    Pulmonary neg pulmonary ROS, sleep apnea    Pulmonary exam normal breath sounds clear to auscultation       Cardiovascular Exercise Tolerance: Good hypertension, negative cardio ROS + Valvular Problems/Murmurs  Rhythm:regular Rate:Normal     Neuro/Psych  Neuromuscular disease negative neurological ROS  negative psych ROS   GI/Hepatic negative GI ROS, Neg liver ROS, hiatal hernia, PUD,GERD  ,,  Endo/Other  negative endocrine ROS    Renal/GU negative Renal ROS  negative genitourinary   Musculoskeletal   Abdominal   Peds  Hematology negative hematology ROS (+) Blood dyscrasia, anemia   Anesthesia Other Findings   Reproductive/Obstetrics negative OB ROS                             Anesthesia Physical Anesthesia Plan  ASA: 2  Anesthesia Plan: Spinal   Post-op Pain Management:    Induction:   PONV Risk Score and Plan: Propofol infusion  Airway Management Planned:   Additional Equipment:   Intra-op Plan:   Post-operative Plan:   Informed Consent: I have reviewed the patients History and Physical, chart, labs and discussed the procedure including the risks, benefits and alternatives for the proposed anesthesia with the patient or authorized representative who has indicated his/her understanding and acceptance.     Dental Advisory Given  Plan Discussed with: CRNA  Anesthesia Plan Comments:        Anesthesia Quick Evaluation

## 2023-02-18 NOTE — Transfer of Care (Signed)
Immediate Anesthesia Transfer of Care Note  Patient: Wanda [name]  Procedure(s) Performed: LEFT INTRAMEDULLARY (IM) NAIL INTERTROCHANTERIC (Left: Hip)  Patient Location: PACU  Anesthesia Type:General  Level of Consciousness: drowsy  Airway & Oxygen Therapy: Patient Spontanous Breathing and Patient connected to face mask oxygen  Post-op Assessment: Report given to RN and Post -op Vital signs reviewed and stable  Post vital signs: Reviewed and stable  Last Vitals:  Vitals Value Taken Time  BP    Temp    Pulse 78 02/18/23 1311  Resp 22 02/18/23 1311  SpO2 95 % 02/18/23 1311  Vitals shown include unvalidated device data.  Last Pain:  Vitals:   02/18/23 1043  TempSrc:   PainSc: 0-No pain      Patients Stated Pain Goal: 5 ([id number])  Complications: No notable events documented.

## 2023-02-18 NOTE — Progress Notes (Signed)
   ORTHOPAEDIC PROGRESS NOTE  Scheduled for Left Hip Cephalomedullary nail  DOS: 02/18/23  SUBJECTIVE: No issues over night.  Pain in the leg with movement.  All questions answered.   OBJECTIVE: PE:  Alert and oriented, no acute distress  Left hip without bruising. Left leg held in external rotation position Toes are warm and well-perfused. Sensation intact over the dorsum of the foot Active motion intact in EHL/TA.  Vitals:   02/18/23 0439 02/18/23 1043  BP: (!) 135/55 (!) 149/80  Pulse: 80 74  Resp: 18 13  Temp: 98.4 F (36.9 C) 98.5 F (36.9 C)  SpO2: 100% 99%      Latest Ref Rng & Units 02/18/2023    3:39 AM 02/17/2023    3:57 AM 02/16/2023   12:17 PM  CBC  WBC 4.0 - 10.5 K/uL 12.6  15.2    Hemoglobin 12.0 - 15.0 g/dL 10.4  11.0  14.6   Hematocrit 36.0 - 46.0 % 32.2  33.1  43.0   Platelets 150 - 400 K/uL 247  286       ASSESSMENT: Wanda [name] is a 89 y.o. female doing well.  Ready for OR.  NPO since midnight.   PLAN: Weightbearing: NWB LLE Insicional and dressing care: Reinforce dressings as needed; none currently Orthopedic device(s): None VTE prophylaxis: Recommend Aspirin 81mg  BID; to begin POD#1 Pain control: PO pain medications as needed; judicious use of narcotics Follow - up plan: 2 weeks   Contact information:     Zakkiyya Barno A. Amedeo Kinsman, MD [facility] [address] [place],  [place]  [address] Phone: [phone] Fax: [phone]

## 2023-02-18 NOTE — Progress Notes (Signed)
PROGRESS NOTE  ARIE DESJARLAIS  [id number] DOB: [birth year] DOA: 02/16/2023 PCP: Renee Rival, NP   Brief Narrative:  Patient is a 89 year old female with history of hypertension, frequent UTI, vitamin B12 deficiency who was brought out by her family here after she was found on the floor, appeared to be lethargic/fatigued, more confused.  Patient was recently seen by a provider via televisit, suspected to have UTI and was given ciprofloxacin.  On presentation she was pleasantly confused.  History was taken from family.  She was hemodynamically stable.  Lab work showed potassium of 2.5, magnesium of 1.6, CK of 665, WBC of 16.7.  UA was not that reassuring for UTI. X-ray of the hip showed mildly angulated intertrochanteric left femoral fracture.  CT head/cervical spine did not show any fracture or dislocation.  Orthopedics consulted,plan for ORiF    Assessment & Plan:  Principal Problem:   Closed left hip fracture Active Problems:   HTN (hypertension)   Hypokalemia   UTI (urinary tract infection)   Hypomagnesemia   Fall/left hip fracture: Found on the floor.X-ray of the hip showed mildly angulated intertrochanteric left femoral fracture.  Orthopedics consulted,plan for ORIF today.  Patient has minimal to moderate risks for operative procedure, may proceed with surgery without further testing.   Continue pain management, supportive care.  Needs PT/OT consultation after surgery.  Acute encephalopathy/UTI: Patient has history of recurrent UTI.  CT head did not show any acute findings.  No focal neurological deficits on examination.  Was recently started on antibiotic as an outpatient for suspected UTI.   UA was not reassuring, urine culture  sent.  Completed 3 days course of ceftriaxone. Currently she is fully alert and oriented, denies any dysuria. Continue frequent reorientation, delirium precautions  Severe hypokalemia/hypomagnesemia: Being supplemented and monitored. takes  hydrochlorothiazide at home.   Elevated CK: Likely secondary to fall with mild rhabdo.  Started on IV fluids.  Hypertension: On lisinopril, hydrochlorothiazide at home.  Monitor blood pressure.  Continue  as needed medications for now  Leukocytosis: Could be from UTI or could be reactive.  Continue to monitor.  Afebrile.Improving     Nutrition Problem: Increased nutrient needs Etiology: post-op healing    DVT prophylaxis:SCDs Start: 02/16/23 1420     Code Status: Full Code  Family Communication: Son at bedside  Patient status:Inpatient  Patient is from :Home  Anticipated discharge to:SNF vs HH  Estimated DC date:not sure   Consultants: Orthopedics  Procedures:None yet  Antimicrobials:  Anti-infectives (From admission, onward)    Start     Dose/Rate Route Frequency Ordered Stop   02/18/23 0900  [MAR Hold]  ceFAZolin (ANCEF) IVPB 2g/100 mL premix        (MAR Hold since Thu 02/18/2023 at 1006.Hold Reason: Transfer to a Procedural area)   2 g 200 mL/hr over 30 Minutes Intravenous On call to O.R. 02/18/23 0507 02/18/23 1135   02/18/23 0600  ceFAZolin (ANCEF) IVPB 2g/100 mL premix  Status:  Discontinued        2 g 200 mL/hr over 30 Minutes Intravenous On call to O.R. 02/16/23 1313 02/18/23 0507   02/16/23 1430  [MAR Hold]  cefTRIAXone (ROCEPHIN) 1 g in sodium chloride 0.9 % 100 mL IVPB        (MAR Hold since Thu 02/18/2023 at 1006.Hold Reason: Transfer to a Procedural area)   1 g 200 mL/hr over 30 Minutes Intravenous Every 24 hours 02/16/23 1423 02/18/23 2359       Subjective: Patient seen  and examined at the bedside today.  Waiting for ORIF today.  Alert and oriented.  Complains of left hip pain when she moves her left lower extremity  Objective: Vitals:   02/17/23 1256 02/17/23 2044 02/18/23 0439 02/18/23 1043  BP: (!) 144/75 (!) 148/72 (!) 135/55 (!) 149/80  Pulse: 96 84 80 74  Resp:  18 18 13   Temp: 98.6 F (37 C) 98.1 F (36.7 C) 98.4 F (36.9 C) 98.5 F  (36.9 C)  TempSrc: Oral Oral Oral   SpO2: 100% 99% 100% 99%  Weight:    54.4 kg  Height:    4\' 8"  (1.422 m)    Intake/Output Summary (Last 24 hours) at 02/18/2023 1304 Last data filed at 02/18/2023 1257 Gross per 24 hour  Intake 1554.82 ml  Output 1500 ml  Net 54.82 ml   Filed Weights   02/16/23 1140 02/18/23 1043  Weight: 54.4 kg 54.4 kg    Examination:  General exam: Overall comfortable, not in distress HEENT: PERRL Respiratory system:  no wheezes or crackles  Cardiovascular system: S1 & S2 heard, RRR.  Gastrointestinal system: Abdomen is nondistended, soft and nontender. Central nervous system: Alert and oriented Extremities: No edema, no clubbing ,no cyanosis,tenderness of left hip,decreased range of motion of left lower extremity Skin: No rashes, no ulcers,no icterus    Data Reviewed: I have personally reviewed following labs and imaging studies  CBC: Recent Labs  Lab 02/16/23 1206 02/16/23 1217 02/17/23 0357 02/18/23 0339  WBC 16.7*  --  15.2* 12.6*  NEUTROABS 15.4*  --   --   --   HGB 13.2 14.6 11.0* 10.4*  HCT 38.8 43.0 33.1* 32.2*  MCV 93.9  --  96.2 98.5  PLT 327  --  286 [id number]   Basic Metabolic Panel: Recent Labs  Lab 02/16/23 1206 02/16/23 1217 02/16/23 1839 02/17/23 0357 02/18/23 0339  NA 136 139  --  138 137  K 2.5* 2.6* 3.0* 3.6 3.2*  CL 97* 97*  --  103 104  CO2 25  --   --  26 24  GLUCOSE 156* 157*  --  110* 110*  BUN 13 13  --  10 15  CREATININE 0.66 0.50  --  0.63 0.61  CALCIUM 9.0  --   --  8.4* 8.1*  MG 1.6*  --   --  2.1  --      Recent Results (from the past 240 hour(s))  MRSA Next Gen by PCR, Nasal     Status: None   Collection Time: 02/18/23  2:45 AM   Specimen: Nasal Mucosa; Nasal Swab  Result Value Ref Range Status   MRSA by PCR Next Gen NOT DETECTED NOT DETECTED Final    Comment: (NOTE) The GeneXpert MRSA Assay (FDA approved for NASAL specimens only), is one component of a comprehensive MRSA colonization  surveillance program. It is not intended to diagnose MRSA infection nor to guide or monitor treatment for MRSA infections. Test performance is not FDA approved in patients less than 72 years old. Performed at [facility], [address], [place], [place] [address]      Radiology Studies: No results found.  Scheduled Meds:  [MAR Hold] lactose free nutrition  237 mL Oral Q2000   [MAR Hold] multivitamin-lutein  1 capsule Oral Daily   [MAR Hold] polyethylene glycol  17 g Oral Daily   [MAR Hold] senna  1 tablet Oral BID   Continuous Infusions:  [MAR Hold] cefTRIAXone (ROCEPHIN)  IV 1 g (  02/17/23 2029)   lactated ringers 10 mL/hr at 02/18/23 1110   [MAR Hold] potassium chloride 0 mEq (02/18/23 1011)     LOS: 2 days   Shelly Coss, MD [facility] P4/02/2023, 1:04 PM

## 2023-02-18 NOTE — TOC Initial Note (Signed)
Transition of Care [facility]) - Initial/Assessment Note    Patient Details  Name: Wanda [name] MRN: [id number] Date of Birth: 04-09-1933  [facility]) CM/SW Contact:    Shade Flood, LCSW Phone Number: 02/18/2023, 11:13 AM  Clinical Narrative:                  Pt admitted with hip fx and is having repair in OR today. PT will eval pt tomorrow. Anticipating she will need SNF rehab at dc. TOC will follow up after PT evaluation and assist with any referrals/placement as needed.  Expected Discharge Plan: Skilled Nursing Facility Barriers to Discharge: Continued Medical Work up   Patient Goals and CMS Choice            Expected Discharge Plan and Services In-house Referral: Clinical Social Work     Living arrangements for the past 2 months: Single Family Home                                      Prior Living Arrangements/Services Living arrangements for the past 2 months: Single Family Home Lives with:: Self Patient language and need for interpreter reviewed:: Yes        Need for Family Participation in Patient Care: No (Comment)     Criminal Activity/Legal Involvement Pertinent to Current Situation/Hospitalization: No - Comment as needed  Activities of Daily Living Home Assistive Devices/Equipment: Cane (specify quad or straight) ADL Screening (condition at time of admission) Patient's cognitive ability adequate to safely complete daily activities?: Yes Is the patient deaf or have difficulty hearing?: No Does the patient have difficulty seeing, even when wearing glasses/contacts?: No Does the patient have difficulty concentrating, remembering, or making decisions?: Yes Patient able to express need for assistance with ADLs?: Yes Does the patient have difficulty dressing or bathing?: Yes Independently performs ADLs?: No Communication: Independent Dressing (OT): Needs assistance Is this a change from baseline?: Change from baseline, expected to  last >3 days Grooming: Needs assistance Is this a change from baseline?: Change from baseline, expected to last >3 days Feeding: Independent Bathing: Needs assistance Is this a change from baseline?: Change from baseline, expected to last >3 days Toileting: Dependent Is this a change from baseline?: Change from baseline, expected to last >3days In/Out Bed: Dependent Is this a change from baseline?: Change from baseline, expected to last >3 days Walks in Home: Independent with device (comment) Does the patient have difficulty walking or climbing stairs?: Yes Weakness of Legs: Both Weakness of Arms/Hands: None  Permission Sought/Granted                  Emotional Assessment       Orientation: : Oriented to Self, Oriented to Place, Oriented to  Time, Oriented to Situation Alcohol / Substance Use: Not Applicable Psych Involvement: No (comment)  Admission diagnosis:  Closed left hip fracture [S72.002A] Patient Active Problem List   Diagnosis Date Noted   Closed left hip fracture 02/16/2023   UTI (urinary tract infection) 02/16/2023   Hypomagnesemia 02/16/2023   S/P repair of paraesophageal hernia 07/08/2021   UGIB (upper gastrointestinal bleed) 03/23/2021   Upper GI bleed 03/22/2021   Acute GI bleeding 12/26/2020   Acute upper GI bleed 02/11/2020   Loss of weight 06/28/2019   Sleep apnea 11/20/2018   Hypokalemia 11/20/2018   Leukocytosis 11/20/2018   Symptomatic anemia 11/04/2018   Diarrhea, functional 03/15/2015  Fatigue 12/28/2014   Upper abdominal pain 12/28/2014   B12 deficiency 12/28/2014    Class: History of   Acute blood loss anemia 10/02/2013   GI bleed 10/02/2013   Melena 10/02/2013   Hematemesis 10/02/2013   Sinus tachycardia 10/02/2013   Multiple gastric erosions 10/02/2013   Hiatal hernia 10/02/2013   Gastric polyps 10/02/2013   HTN (hypertension)    IDA (iron deficiency anemia)    Adhesive capsulitis of right shoulder 03/01/2012   Iron  deficiency anemia 02/02/2012   Distal radius fracture 11/30/2011   Diverticulosis 10/20/2010   Normocytic anemia 10/01/2010   GERD 10/01/2010   FECAL OCCULT BLOOD 10/01/2010   PCP:  Renee Rival, NP Pharmacy:   [place], [place] - [address] [place] [address] Phone: [phone] Fax: [phone]     Social Determinants of Health (SDOH) Social History: SDOH Screenings   Tobacco Use: Low Risk  (02/18/2023)   SDOH Interventions:     Readmission Risk Interventions     No data to display

## 2023-02-18 NOTE — Op Note (Signed)
Orthopaedic Surgery Operative Note (CSN: [id number])  Wanda [name]  07-10-1933 Date of Surgery: 02/18/2023   Diagnoses:  Left intertrochanteric femur fracture  Procedure: Cephalomedullary nail for Left intertrochanteric femur fracture   Operative Finding Successful completion of the planned procedure.  Placement of 125 degree, 10 mm x 300 mm nail with 90 mm cephalomedullary lag screw and a single distal interlocking screw.    Post-Op Diagnosis: Same Surgeons:Primary: Mordecai Rasmussen, MD Assistants:  None Location: [facility] OR ROOM 4 Anesthesia: General with local anesthesia Antibiotics: Ancef 2 g Tourniquet time: N/A Estimated Blood Loss: [id number] cc Complications: None Specimens: None  Implants: Implant Name Type Inv. Item Serial No. Manufacturer Lot No. LRB No. Used Action  SCREW LOCK LAG FEM 10.5X90 - [id number] Screw SCREW LOCK LAG FEM 10.5X90  [employer] STERILE ON FIELD Left 1 Implanted  SCREW LOCK CORT 5X34 - [id number] Screw SCREW LOCK CORT 5X34  [employer] STERILE ON SET Left 1 Implanted  NAIL LEFT [id number] - SSTERILE ON SET Nail NAIL LEFT [id number] ES STERILE ON SET [employer]  Left 1 Implanted    Indications for Surgery:   Wanda [name] is a 89 y.o. female who had a mechanical fall and sustained a Left intertrochanteric femur fracture.  I recommended operative fixation to restore stability and allow the patient to ambulate immediately postop.  Benefits and risks of operative and nonoperative management were discussed prior to surgery with patient/guardian(s) and informed consent form was completed.  Specific risks including infection, need for additional surgery, persistent pain, bleeding, malunion, nonunion and more severe complications associated with anesthesia.  The patient/guardian elected to proceed and surgical consent was obtained.    Procedure:   The patient was identified properly. Informed consent was obtained and the surgical site was marked. The patient  was taken to the OR where general anesthesia was induced.   The patient was placed supine on a fracture table and appropriate reduction was obtained and visualized on fluoroscopy prior to the beginning of the procedure.  Timeout was performed before the beginning of the case.  Ancef 2 g dosing was confirmed prior to making incision.    The patient received TXA prior to the start of surgery.   We made an incision proximal to the greater trochanter and dissected down through the fascia.  We then carefully placed a guidepin, localizing under fluoroscopy.  Once satisfied with the starting point, the entry reamer was used to gain entry into the intramedullary canal.  A ball tip guidewire was then introduced and passed to an appropriate level at the physeal scar of the distal femur and measurement was obtained proximally using fluoroscopy.  We selected the appropriate length of nail, as noted above.  Entry reamer was used needed but the nail was unreamed.  At this point we placed our nail localizing under fluoroscopy, and confirmed that it was at the appropriate level.  Next we used the outrigger device to pass a wire into the femoral neck, and then the cephalomedullary lag screw.  The screw was locked proximally to avoid over collapse.  We then turned our attention to the distal interlocking screw.  Once again, we used the outrigger device to place a single interlocking screw in the midshaft area.  The outrigger device was removed and final fluoroscopic images were obtained.  The wounds were thoroughly irrigated closed in a multilayer fashion with 2-0 monocryl and staples.  Sterile dressings were placed.  The patient was awoken from general anesthesia  and taken to the PACU in stable condition without complication.     Post-operative plan:  Weightbearing: The patient will be WBAT on the operative extremity.   DVT prophylaxis per primary team, no orthopedic contraindications.  Recommend 81 mg Aspirin BID,  unless patient cannot tolerate or was previously on anticoagulation.  Prefer to start Ppx POD#1 Pain control with PRN pain medication preferring oral medicines.   Dressing can be reinforced as needed, will change on POD#2/3 if needed.  Patient does not need to remain hospitalized for dressing change Follow up plan: approximately 2 weeks postop for incision check and XR.  If the patient will be returning to a nursing facility, staples can be removed around this time and a follow up appointment can be scheduled for 6 weeks after surgery. XR at next visit:  please obtain [facility] pelvis, and 2 views of the Left hip

## 2023-02-18 NOTE — Anesthesia Procedure Notes (Signed)
Procedure Name: LMA Insertion Date/Time: 02/18/2023 11:44 AM  Performed by: Mammie Russian, RNPre-anesthesia Checklist: Patient identified, Emergency Drugs available, Suction available and Patient being monitored Patient Re-evaluated:Patient Re-evaluated prior to induction Oxygen Delivery Method: Circle system utilized Preoxygenation: Pre-oxygenation with 100% oxygen Induction Type: IV induction Ventilation: Mask ventilation without difficulty LMA: LMA with gastric port inserted LMA Size: 4.0 Tube secured with: Tape Dental Injury: Teeth and Oropharynx as per pre-operative assessment  Comments: LMA placement confirmed with positive ETCO2, bilateral chest rise and equal breath sounds

## 2023-02-19 DIAGNOSIS — S72002A Fracture of unspecified part of neck of left femur, initial encounter for closed fracture: Secondary | ICD-10-CM | POA: Diagnosis not present

## 2023-02-19 LAB — BASIC METABOLIC PANEL
Anion gap: 9 (ref 5–15)
BUN: 21 mg/dL (ref 8–23)
CO2: 23 mmol/L (ref 22–32)
Calcium: 8 mg/dL — ABNORMAL LOW (ref 8.9–10.3)
Chloride: 104 mmol/L (ref 98–111)
Creatinine, Ser: 0.67 mg/dL (ref 0.44–1.00)
GFR, Estimated: >60 mL/min (ref >60)
Glucose, Bld: 120 mg/dL — ABNORMAL HIGH (ref 70–99)
Potassium: 4.3 mmol/L (ref 3.5–5.1)
Sodium: 136 mmol/L (ref 135–145)

## 2023-02-19 LAB — CBC
HCT: 29.8 % — ABNORMAL LOW (ref 36.0–46.0)
Hemoglobin: 9.9 g/dL — ABNORMAL LOW (ref 12.0–15.0)
MCH: 32.5 pg (ref 26.0–34.0)
MCHC: 33.2 g/dL (ref 30.0–36.0)
MCV: 97.7 fL (ref 80.0–100.0)
Platelets: 261 10*3/uL (ref 150–400)
RBC: 3.05 MIL/uL — ABNORMAL LOW (ref 3.87–5.11)
RDW: 13.1 % (ref 11.5–15.5)
WBC: 14.2 10*3/uL — ABNORMAL HIGH (ref 4.0–10.5)
nRBC: 0 % (ref 0.0–0.2)

## 2023-02-19 MED ORDER — ASPIRIN 81 MG PO CHEW
81.0000 mg | CHEWABLE_TABLET | Freq: Two times a day (BID) | ORAL | Status: DC
  Administered 2023-02-19 – 2023-02-21 (×5): 81 mg via ORAL
  Filled 2023-02-19 (×4): qty 1

## 2023-02-19 NOTE — Progress Notes (Signed)
Patient has slept through the night. Patient is alert and talking more this morning. Mentation is at baseline. Prn medication given once during this shift. Plan of care ongoing.

## 2023-02-19 NOTE — [place] FL2 (Signed)
[place] MEDICAID FL2 LEVEL OF CARE FORM     IDENTIFICATION  Patient Name: Wanda [name] Birthdate: [birth year] Sex: female Admission Date (Current Location): 02/16/2023  [facility] and [place] Number:  [employer] and Address:  [facility],  [address] [place] [address]      Provider Number: [id number]  Attending Physician Name and Address:  Burnadette Pop, MD  Relative Name and Phone Number:       Current Level of Care: Hospital Recommended Level of Care: Skilled Nursing Facility Prior Approval Number:    Date Approved/Denied:   PASRR Number: [id number]  Discharge Plan: SNF    Current Diagnoses: Patient Active Problem List   Diagnosis Date Noted   Closed left hip fracture 02/16/2023   UTI (urinary tract infection) 02/16/2023   Hypomagnesemia 02/16/2023   S/P repair of paraesophageal hernia 07/08/2021   UGIB (upper gastrointestinal bleed) 03/23/2021   Upper GI bleed 03/22/2021   Acute GI bleeding 12/26/2020   Acute upper GI bleed 02/11/2020   Loss of weight 06/28/2019   Sleep apnea 11/20/2018   Hypokalemia 11/20/2018   Leukocytosis 11/20/2018   Symptomatic anemia 11/04/2018   Diarrhea, functional 03/15/2015   Fatigue 12/28/2014   Upper abdominal pain 12/28/2014   B12 deficiency 12/28/2014   Acute blood loss anemia 10/02/2013   GI bleed 10/02/2013   Melena 10/02/2013   Hematemesis 10/02/2013   Sinus tachycardia 10/02/2013   Multiple gastric erosions 10/02/2013   Hiatal hernia 10/02/2013   Gastric polyps 10/02/2013   HTN (hypertension)    IDA (iron deficiency anemia)    Adhesive capsulitis of right shoulder 03/01/2012   Iron deficiency anemia 02/02/2012   Distal radius fracture 11/30/2011   Diverticulosis 10/20/2010   Normocytic anemia 10/01/2010   GERD 10/01/2010   FECAL OCCULT BLOOD 10/01/2010    Orientation RESPIRATION BLADDER Height & Weight     Self, Time, Situation, Place  Normal Continent Weight: 119 lb 14.9  oz (54.4 kg) Height:  4\' 8"  (142.2 cm)  BEHAVIORAL SYMPTOMS/MOOD NEUROLOGICAL BOWEL NUTRITION STATUS      Continent Diet (see dc summary)  AMBULATORY STATUS COMMUNICATION OF NEEDS Skin   Extensive Assist Verbally Surgical wounds, Skin abrasions                       Personal Care Assistance Level of Assistance  Bathing, Feeding, Dressing Bathing Assistance: Limited assistance Feeding assistance: Independent Dressing Assistance: Limited assistance     Functional Limitations Info  Sight, Hearing, Speech Sight Info: Adequate Hearing Info: Adequate Speech Info: Adequate    SPECIAL CARE FACTORS FREQUENCY  PT (By licensed PT), OT (By licensed OT)     PT Frequency: 5x week OT Frequency: 3x week            Contractures Contractures Info: Not present    Additional Factors Info  Code Status, Allergies Code Status Info: Full Allergies Info: Sulfa Antibiotics, Naproxen Sodium           Current Medications (02/19/2023):  This is the current hospital active medication list Current Facility-Administered Medications  Medication Dose Route Frequency Provider Last Rate Last Admin   acetaminophen (TYLENOL) tablet 650 mg  650 mg Oral Q6H PRN Oliver Barre, MD       aspirin chewable tablet 81 mg  81 mg Oral BID Burnadette Pop, MD   81 mg at 02/19/23 0852   fentaNYL (SUBLIMAZE) injection 50 mcg  50 mcg Intravenous Q1H PRN Oliver Barre,  MD   50 mcg at 02/19/23 0850   hydrALAZINE (APRESOLINE) injection 10 mg  10 mg Intravenous Q6H PRN Oliver Barreairns, Mark A, MD       HYDROcodone-acetaminophen (NORCO/VICODIN) 5-325 MG per tablet 1-2 tablet  1-2 tablet Oral Q6H PRN Oliver Barreairns, Mark A, MD   1 tablet at 02/19/23 0526   lactose free nutrition (BOOST PLUS) liquid 237 mL  237 mL Oral Q2000 Oliver Barreairns, Mark A, MD   237 mL at 02/18/23 1950   multivitamin-lutein (OCUVITE-LUTEIN) capsule 1 capsule  1 capsule Oral Daily Oliver Barreairns, Mark A, MD   1 capsule at 02/19/23 0843   ondansetron (ZOFRAN) injection 4 mg  4  mg Intravenous Q6H PRN Oliver Barreairns, Mark A, MD       polyethylene glycol (MIRALAX / GLYCOLAX) packet 17 g  17 g Oral Daily Oliver Barreairns, Mark A, MD   17 g at 02/19/23 [id number]   senna (SENOKOT) tablet 8.6 mg  1 tablet Oral BID Oliver Barreairns, Mark A, MD   8.6 mg at 02/19/23 [id number]     Discharge Medications: Please see discharge summary for a list of discharge medications.  Relevant Imaging Results:  Relevant Lab Results:   Additional Information SSN: [id number]  Wanda [name], [place]

## 2023-02-19 NOTE — Plan of Care (Signed)
  Problem: Acute Rehab OT Goals (only OT should resolve) Goal: Pt. Will Perform Grooming Flowsheets (Taken 02/19/2023 1101) Pt Will Perform Grooming:  with modified independence  sitting Goal: Pt. Will Perform Lower Body Bathing Flowsheets (Taken 02/19/2023 1101) Pt Will Perform Lower Body Bathing:  with min assist  sitting/lateral leans  with adaptive equipment Goal: Pt. Will Perform Upper Body Dressing Flowsheets (Taken 02/19/2023 1101) Pt Will Perform Upper Body Dressing:  with modified independence  sitting Goal: Pt. Will Perform Lower Body Dressing Flowsheets (Taken 02/19/2023 1101) Pt Will Perform Lower Body Dressing:  with min assist  with adaptive equipment  sitting/lateral leans Goal: Pt. Will Transfer To Toilet Flowsheets (Taken 02/19/2023 1101) Pt Will Transfer to Toilet:  with min guard assist  stand pivot transfer Goal: Pt. Will Perform Toileting-Clothing Manipulation Flowsheets (Taken 02/19/2023 1101) Pt Will Perform Toileting - Clothing Manipulation and hygiene:  with min guard assist  sitting/lateral leans Goal: Pt/Caregiver Will Perform Home Exercise Program Flowsheets (Taken 02/19/2023 1101) Pt/caregiver will Perform Home Exercise Program:  Increased strength  Both right and left upper extremity  Independently  Mayline Dragon OT, MOT

## 2023-02-19 NOTE — Evaluation (Signed)
Physical Therapy Evaluation Patient Details Name: Wanda [name] MRN: [id number] DOB: 04-16-1933 Today's Date: 02/19/2023  History of Present Illness  Wanda [name] is a 89 y.o. female with a past medical history of essential hypertension, frequent UTIs, B12 deficiency who was in her usual state of health untill the last 1 to 2 weeks when family members noted that the patient was more fatigued than usual.  She was more lethargic at times.  She also appeared to be a little bit more confused.  The symptoms worsened and so family decided to do a telehealth visit with provider.  Patient has a history of frequent UTIs and so urinary tract infection was suspected.  Apparently a dipstick testing was done which showed UTI.  Healthcare provider called in prescription for ciprofloxacin.  She took 1 dose yesterday afternoon.  She was supposed to take another dose last night but the son is not sure if she took that or not.  As of yesterday afternoon patient was able to walk without any difficulty.  Sometime during the night she apparently had a fall.  She was found on the floor when checked on by family members this morning.  She was complaining of pain in her left lower extremity.  She was brought into the emergency department.  Patient is pleasantly confused at this time.  Does not appear to be in a lot of discomfort.  Son is at the bedside and provided most of the information.  Patient was not able to give much information due to her confusion.   Clinical Impression  Patient limited for functional mobility as stated below secondary to L hip pain, BLE weakness, fatigue and impaired standing balance. Patient with slow, labored bed mobility requiring assist for LLE to transition to seated EOB. She demonstrates good sitting balance and sitting tolerance at bedside. She requires mod assist, cueing, and RW to transfer to standing. She completes standing weight shifts and is limited to very small scooting steps at  bedside to chair. Patient will benefit from continued physical therapy in hospital and recommended venue below to increase strength, balance, endurance for safe ADLs and gait.        Recommendations for follow up therapy are one component of a multi-disciplinary discharge planning process, led by the attending physician.  Recommendations may be updated based on patient status, additional functional criteria and insurance authorization.  Follow Up Recommendations       Assistance Recommended at Discharge Intermittent Supervision/Assistance  Patient can return home with the following  A lot of help with walking and/or transfers;A lot of help with bathing/dressing/bathroom;Assistance with cooking/housework    Equipment Recommendations None recommended by PT  Recommendations for Other Services       Functional Status Assessment Patient has had a recent decline in their functional status and demonstrates the ability to make significant improvements in function in a reasonable and predictable amount of time.     Precautions / Restrictions Precautions Precautions: Fall Restrictions Weight Bearing Restrictions: Yes LLE Weight Bearing: Weight bearing as tolerated      Mobility  Bed Mobility Overal bed mobility: Needs Assistance Bed Mobility: Supine to Sit     Supine to sit: Mod assist     General bed mobility comments: Slow labored movement.    Transfers Overall transfer level: Needs assistance Equipment used: Rolling walker (2 wheels) Transfers: Sit to/from Stand, Bed to chair/wheelchair/BSC Sit to Stand: Mod assist   Step pivot transfers: Mod assist  General transfer comment: Labord movement; extended time for EOB to chair transfers. Pt mostly scooting B LE rather than taking steps.    Ambulation/Gait Ambulation/Gait assistance: Mod assist Gait Distance (Feet): 3 Feet Assistive device: Rolling walker (2 wheels)   Gait velocity: decreased     General Gait  Details: very slow, lateral foot scooting at bedside to ambulate to chair with frequent cueing  Stairs            Wheelchair Mobility    Modified Rankin (Stroke Patients Only)       Balance Overall balance assessment: Needs assistance Sitting-balance support: No upper extremity supported, Feet supported Sitting balance-Leahy Scale: Good Sitting balance - Comments: fair to good at EOB   Standing balance support: Bilateral upper extremity supported, During functional activity, Reliant on assistive device for balance Standing balance-Leahy Scale: Poor Standing balance comment: using RW                             Pertinent Vitals/Pain Pain Assessment Pain Assessment: Faces Faces Pain Scale: Hurts even more Pain Location: L hip Pain Descriptors / Indicators: Grimacing, Guarding Pain Intervention(s): Limited activity within patient's tolerance, Monitored during session, Repositioned    Home Living Family/patient expects to be discharged to:: Private residence Living Arrangements: Alone Available Help at Discharge: Family;Available PRN/intermittently Type of Home: House Home Access: Stairs to enter Entrance Stairs-Rails: Lawyer of Steps: 3   Home Layout: One level Home Equipment: Agricultural consultant (2 wheels);Wheelchair - manual;BSC/3in1;Grab bars - toilet      Prior Function Prior Level of Function : Needs assist       Physical Assist : ADLs (physical)   ADLs (physical): IADLs Mobility Comments: Houshold ambulator without AD. Gilmer Mor used for community ambulation. ADLs Comments: Assist for IADL's; indepndent ADL's.     Hand Dominance   Dominant Hand: Left    Extremity/Trunk Assessment   Upper Extremity Assessment Upper Extremity Assessment: Defer to OT evaluation    Lower Extremity Assessment Lower Extremity Assessment: Generalized weakness;LLE deficits/detail LLE: Unable to fully assess due to pain    Cervical /  Trunk Assessment Cervical / Trunk Assessment: Kyphotic  Communication   Communication: No difficulties  Cognition Arousal/Alertness: Awake/alert Behavior During Therapy: WFL for tasks assessed/performed Overall Cognitive Status: Within Functional Limits for tasks assessed                                          General Comments      Exercises     Assessment/Plan    PT Assessment Patient needs continued PT services  PT Problem List Decreased strength;Decreased activity tolerance;Decreased balance;Decreased mobility;Pain       PT Treatment Interventions DME instruction;Therapeutic exercise;Gait training;Balance training;Neuromuscular re-education;Stair training;Functional mobility training;Therapeutic activities;Patient/family education    PT Goals (Current goals can be found in the Care Plan section)  Acute Rehab PT Goals Patient Stated Goal: get better and go home PT Goal Formulation: With patient Time For Goal Achievement: 03/05/23 Potential to Achieve Goals: Good    Frequency Min 3X/week     Co-evaluation PT/OT/SLP Co-Evaluation/Treatment: Yes Reason for Co-Treatment: To address functional/ADL transfers PT goals addressed during session: Mobility/safety with mobility;Balance;Proper use of DME;Strengthening/ROM OT goals addressed during session: ADL's and self-care       AM-PAC PT "6 Clicks" Mobility  Outcome Measure Help needed turning from your  back to your side while in a flat bed without using bedrails?: A Little Help needed moving from lying on your back to sitting on the side of a flat bed without using bedrails?: A Lot Help needed moving to and from a bed to a chair (including a wheelchair)?: A Lot Help needed standing up from a chair using your arms (e.g., wheelchair or bedside chair)?: A Lot Help needed to walk in hospital room?: A Lot Help needed climbing 3-5 steps with a railing? : A Lot 6 Click Score: 13    End of Session Equipment  Utilized During Treatment: Gait belt Activity Tolerance: Patient limited by pain;Patient limited by fatigue Patient left: in chair;with call bell/phone within reach;with family/visitor present;with chair alarm set Nurse Communication: Mobility status PT Visit Diagnosis: Unsteadiness on feet (R26.81);Other abnormalities of gait and mobility (R26.89);Muscle weakness (generalized) (M62.81);Pain Pain - Right/Left: Left Pain - part of body: Hip    Time: [id number] PT Time Calculation (min) (ACUTE ONLY): 25 min   Charges:   PT Evaluation $PT Eval Low Complexity: 1 Low PT Treatments $Therapeutic Activity: 8-22 mins        11:50 AM, 02/19/23 Wyman SongsterAndrew S. Caidence Higashi PT, DPT [facility] The[facility]ist [facility]

## 2023-02-19 NOTE — Evaluation (Signed)
Occupational Therapy Evaluation Patient Details Name: Wanda [name] Chunn MRN: [id number] DOB: 29-Nov-1932 Today's Date: 02/19/2023   History of Present Illness Wanda [name] Higashi is a 89 y.o. female with a past medical history of essential hypertension, frequent UTIs, B12 deficiency who was in her usual state of health untill the last 1 to 2 weeks when family members noted that the patient was more fatigued than usual.  She was more lethargic at times.  She also appeared to be a little bit more confused.  The symptoms worsened and so family decided to do a telehealth visit with provider.  Patient has a history of frequent UTIs and so urinary tract infection was suspected.  Apparently a dipstick testing was done which showed UTI.  Healthcare provider called in prescription for ciprofloxacin.  She took 1 dose yesterday afternoon.  She was supposed to take another dose last night but the son is not sure if she took that or not.  As of yesterday afternoon patient was able to walk without any difficulty.  Sometime during the night she apparently had a fall.  She was found on the floor when checked on by family members this morning.  She was complaining of pain in her left lower extremity.  She was brought into the emergency department.  Patient is pleasantly confused at this time.  Does not appear to be in a lot of discomfort.  Son is at the bedside and provided most of the information.  Patient was not able to give much information due to her confusion. (per MD)   Clinical Impression   Pt agreeable to OT and PT co-evaluation. Pt is independent at baseline for ADL's and was ambulating without AD within the home. Today pt requires much assist and time to sit up in bed and transfer to chair with RW. Pt is generally weak as well. Max A needed for lower body dressing at this time. Pt left in chair with chair alarm set and family present. Pt will benefit from continued OT in the hospital and recommended venue below to  increase strength, balance, and endurance for safe ADL's.        Recommendations for follow up therapy are one component of a multi-disciplinary discharge planning process, led by the attending physician.  Recommendations may be updated based on patient status, additional functional criteria and insurance authorization.   Assistance Recommended at Discharge Intermittent Supervision/Assistance  Patient can return home with the following A lot of help with walking and/or transfers;A lot of help with bathing/dressing/bathroom;Assistance with cooking/housework;Assist for transportation;Help with stairs or ramp for entrance    Functional Status Assessment  Patient has had a recent decline in their functional status and demonstrates the ability to make significant improvements in function in a reasonable and predictable amount of time.  Equipment Recommendations  None recommended by OT           Precautions / Restrictions Precautions Precautions: Fall Restrictions Weight Bearing Restrictions: Yes LLE Weight Bearing: Weight bearing as tolerated      Mobility Bed Mobility Overal bed mobility: Needs Assistance Bed Mobility: Supine to Sit     Supine to sit: Mod assist     General bed mobility comments: Slow labored movement.    Transfers Overall transfer level: Needs assistance Equipment used: Rolling walker (2 wheels) Transfers: Sit to/from Stand, Bed to chair/wheelchair/BSC Sit to Stand: Mod assist     Step pivot transfers: Mod assist     General transfer comment: Labord movement; extended time for  EOB to chair transfers. Pt mostly scooting B LE rather than taking steps.      Balance Overall balance assessment: Needs assistance Sitting-balance support: No upper extremity supported, Feet supported Sitting balance-Leahy Scale: Good Sitting balance - Comments: fair to good at EOB   Standing balance support: Bilateral upper extremity supported, During functional  activity, Reliant on assistive device for balance Standing balance-Leahy Scale: Poor Standing balance comment: using RW                           ADL either performed or assessed with clinical judgement   ADL Overall ADL's : Needs assistance/impaired     Grooming: Min guard;Minimal assistance;Sitting   Upper Body Bathing: Min guard;Sitting   Lower Body Bathing: Maximal assistance;Total assistance;Sitting/lateral leans   Upper Body Dressing : Min guard;Minimal assistance;Sitting   Lower Body Dressing: Maximal assistance;Total assistance;Sitting/lateral leans   Toilet Transfer: Moderate assistance;Stand-pivot;Rolling walker (2 wheels) Toilet Transfer Details (indicate cue type and reason): Simulated EOB to chair transfer. Toileting- Clothing Manipulation and Hygiene: Maximal assistance;Bed level       Functional mobility during ADLs: Moderate assistance;Maximal assistance;Rolling walker (2 wheels)       Vision Baseline Vision/History: 0 No visual deficits Ability to See in Adequate Light: 0 Adequate Patient Visual Report: No change from baseline Vision Assessment?: No apparent visual deficits                Pertinent Vitals/Pain Pain Assessment Pain Assessment: Faces Faces Pain Scale: Hurts even more Pain Location: L hip Pain Descriptors / Indicators: Grimacing, Guarding Pain Intervention(s): Limited activity within patient's tolerance, Monitored during session, Repositioned     Hand Dominance Left   Extremity/Trunk Assessment Upper Extremity Assessment Upper Extremity Assessment: Generalized weakness   Lower Extremity Assessment Lower Extremity Assessment: Defer to PT evaluation   Cervical / Trunk Assessment Cervical / Trunk Assessment: Kyphotic   Communication Communication Communication: No difficulties   Cognition Arousal/Alertness: Awake/alert Behavior During Therapy: WFL for tasks assessed/performed Overall Cognitive Status: History of  cognitive impairments - at baseline                                                        Home Living Family/patient expects to be discharged to:: Private residence Living Arrangements: Alone Available Help at Discharge: Family;Available PRN/intermittently Type of Home: House Home Access: Stairs to enter Enter[employer] CorporationEntrance Stairs-Number of Steps: 3 Entrance Stairs-Rails: Left;Right Home Layout: One level     Bathroom Shower/Tub: Chief Strategy OfficerTub/shower unit   Bathroom Toilet: Standard Bathroom Accessibility: Yes How Accessible: Other (comment) Theme park manager(Transport chair) Home Equipment: Agricultural consultantolling Walker (2 wheels);Wheelchair - manual;BSC/3in1;Grab bars - toilet          Prior Functioning/Environment Prior Level of Function : Needs assist       Physical Assist : ADLs (physical)   ADLs (physical): IADLs Mobility Comments: Houshold ambulator without AD. Gilmer MorCane used for community ambulation. ADLs Comments: Assist for IADL's; indepndent ADL's.        OT Problem List: Decreased strength;Decreased activity tolerance;Impaired balance (sitting and/or standing);Decreased safety awareness      OT Treatment/Interventions: Self-care/ADL training;Therapeutic exercise;Therapeutic activities;Patient/family education;Balance training    OT Goals(Current goals can be found in the care plan section) Acute Rehab OT Goals Patient Stated Goal: Open to rehab to get stronger. OT Goal  Formulation: With patient/family Time For Goal Achievement: 03/05/23 Potential to Achieve Goals: Good  OT Frequency: Min 2X/week    Co-evaluation PT/OT/SLP Co-Evaluation/Treatment: Yes Reason for Co-Treatment: To address functional/ADL transfers   OT goals addressed during session: ADL's and self-care                       End of Session Equipment Utilized During Treatment: Rolling walker (2 wheels);Gait belt  Activity Tolerance: Patient tolerated treatment well Patient left: in chair;with call  bell/phone within reach;with chair alarm set;with family/visitor present  OT Visit Diagnosis: Unsteadiness on feet (R26.81);Other abnormalities of gait and mobility (R26.89);Muscle weakness (generalized) (M62.81);History of falling (Z91.81)                Time: 1017-5102 OT Time Calculation (min): 26 min Charges:  OT General Charges $OT Visit: 1 Visit OT Evaluation $OT Eval Low Complexity: 1 Low  Kairen Hallinan OT, MOT  Danie Chandler 02/19/2023, 10:58 AM

## 2023-02-19 NOTE — TOC Progression Note (Signed)
Transition of Care [facility]) - Progression Note    Patient Details  Name: Wanda [name] MRN: [id number] Date of Birth: June 12, 1933  [facility] CM/SW Contact  [place], [place] Phone Number: 02/19/2023, 11:57 AM  Clinical Narrative:    CSW spoke to pts son who is in room with pt to inquire about interest in SNF at D/C. Pts first choice is [facility] and second choice is [facility]. CSW left VM for Skylar in admission at [facility] requesting call back. TOC to follow.   Expected Discharge Plan: Skilled Nursing Facility Barriers to Discharge: Continued Medical Work up  Expected Discharge Plan and Services In-house Referral: Clinical Social Work     Living arrangements for the past 2 months: Single Family Home                                       Social Determinants of Health (SDOH) Interventions SDOH Screenings   Tobacco Use: Low Risk  (02/18/2023)    Readmission Risk Interventions     No data to display

## 2023-02-19 NOTE — Care Management Important Message (Signed)
Important Message  Patient Details Verbal IM given to the Patient. Name: Wanda [name] MRN: [id number] Date of Birth: Sep 16, 1933   Medicare Important Message Given:  Yes     Caren Macadam 02/19/2023, 8:37 AM

## 2023-02-19 NOTE — Progress Notes (Signed)
   ORTHOPAEDIC PROGRESS NOTE  Scheduled for Left Hip Cephalomedullary nail  DOS: 02/18/23  SUBJECTIVE: No acute problems.  No pain in the left leg when not moving.  Good appetite.  Waiting to work with PT  OBJECTIVE: PE:  Alert and oriented, no acute distress  Left hip without bruising. Lateral hip dressings are clean, dry and intact Toes are warm and well-perfused. Sensation intact over the dorsum of the foot Active motion intact in EHL/TA.  Vitals:   02/18/23 2155 02/19/23 0521  BP: (!) 122/59 139/66  Pulse: 100 89  Resp: 19 16  Temp: 98.5 F (36.9 C) 98.5 F (36.9 C)  SpO2: 95% 98%      Latest Ref Rng & Units 02/19/2023    3:33 AM 02/18/2023    3:39 AM 02/17/2023    3:57 AM  CBC  WBC 4.0 - 10.5 K/uL 14.2  12.6  15.2   Hemoglobin 12.0 - 15.0 g/dL 9.9  87.5  64.3   Hematocrit 36.0 - 46.0 % 29.8  32.2  33.1   Platelets 150 - 400 K/uL 261  247  286      ASSESSMENT: Wanda [name] is a 89 y.o. female doing well.  NO issues. Dispo Pending.  Awaiting PT/OT eval  PLAN: Weightbearing: WBAT LLE Insicional and dressing care: Reinforce dressings as needed Orthopedic device(s): None VTE prophylaxis: Recommend Aspirin 81mg  BID; to begin POD#1 Pain control: PO pain medications as needed; judicious use of narcotics Follow - up plan: 2 weeks   Contact information:     Dewey Neukam A. Dallas Schimke, MD MS [facility] [address] [place],  [place]  [address] Phone: [phone] Fax: [phone]

## 2023-02-19 NOTE — Plan of Care (Signed)
  Problem: Acute Rehab PT Goals(only PT should resolve) Goal: Pt Will Go Supine/Side To Sit Outcome: Progressing Flowsheets (Taken 02/19/2023 1151) Pt will go Supine/Side to Sit:  with min guard assist  with minimal assist Goal: Pt Will Go Sit To Supine/Side Outcome: Progressing Flowsheets (Taken 02/19/2023 1151) Pt will go Sit to Supine/Side:  with min guard assist  with minimal assist Goal: Patient Will Transfer Sit To/From Stand Outcome: Progressing Flowsheets (Taken 02/19/2023 1151) Patient will transfer sit to/from stand:  with min guard assist  with minimal assist Goal: Pt Will Transfer Bed To Chair/Chair To Bed Outcome: Progressing Flowsheets (Taken 02/19/2023 1151) Pt will Transfer Bed to Chair/Chair to Bed:  min guard assist  with min assist Goal: Pt Will Ambulate Outcome: Progressing Flowsheets (Taken 02/19/2023 1151) Pt will Ambulate:  15 feet  with rolling walker  with minimal assist  with min guard assist Goal: Pt/caregiver will Perform Home Exercise Program Outcome: Progressing Flowsheets (Taken 02/19/2023 1151) Pt/caregiver will Perform Home Exercise Program:  For increased strengthening  For improved balance  With Supervision, verbal cues required/provided  11:51 AM, 02/19/23 Wyman Songster PT, DPT Physical Therapist at [facility]

## 2023-02-19 NOTE — Progress Notes (Signed)
PROGRESS NOTE  Wanda [name]  ZOX:[id number] DOB: [birth year] DOA: 02/16/2023 PCP: Erasmo DownerStrader, Lindsey F, NP   Brief Narrative:  Patient is a 89 year old female with history of hypertension, frequent UTI, vitamin B12 deficiency who was brought out by her family here after she was found on the floor, appeared to be lethargic/fatigued, more confused.  Patient was recently seen by a provider via televisit, suspected to have UTI and was given ciprofloxacin.  On presentation she was pleasantly confused.  History was taken from family.  She was hemodynamically stable.  Lab work showed potassium of 2.5, magnesium of 1.6, CK of 665, WBC of 16.7.  UA was not that reassuring for UTI. X-ray of the hip showed mildly angulated intertrochanteric left femoral fracture.  CT head/cervical spine did not show any fracture or dislocation.  Orthopedics consulted,underwent ORIF/left hip cephalomedullary nail placement on 4/4.  Waiting for PT/OT evaluation    Assessment & Plan:  Principal Problem:   Closed left hip fracture Active Problems:   HTN (hypertension)   Hypokalemia   UTI (urinary tract infection)   Hypomagnesemia   Fall/left hip fracture: Found on the floor.X-ray of the hip showed mildly angulated intertrochanteric left femoral fracture.   CT head/cervical spine did not show any fracture or dislocation.  Orthopedics consulted,underwent ORIF/left hip cephalomedullary nail placement on 4/4.  Waiting for PT/OT evaluation.  Started on aspirin for DVT prophylaxis. Continue bowel regimen  Acute encephalopathy/UTI: Patient has history of recurrent UTI.  CT head did not show any acute findings.  No focal neurological deficits on examination.  Was recently started on antibiotic as an outpatient for suspected UTI.   UA was not reassuring, urine culture  sent.  Completed 3 days course of ceftriaxone. Currently she is fully alert and oriented, denies any dysuria.  Severe hypokalemia/hypomagnesemia: Being  supplemented and monitored. takes hydrochlorothiazide at home.  Currently stable  Elevated CK: Likely secondary to fall with mild rhabdo.  Given IV fluids  Hypertension: On lisinopril, hydrochlorothiazide at home.  On hold.Monitor blood pressure.  Stable  Leukocytosis: Could be from UTI or could be reactive.  Continue to monitor.  Afebrile.Improving     Nutrition Problem: Increased nutrient needs Etiology: post-op healing    DVT prophylaxis:SCDs Start: 02/16/23 1420     Code Status: Full Code  Family Communication: Son at bedside  Patient status:Inpatient  Patient is from :Home  Anticipated discharge to:SNF vs HH  Estimated DC date:not sure, waiting for PT evaluation   Consultants: Orthopedics  Procedures: ORIF  Antimicrobials:  Anti-infectives (From admission, onward)    Start     Dose/Rate Route Frequency Ordered Stop   02/18/23 0900  ceFAZolin (ANCEF) IVPB 2g/100 mL premix        2 g 200 mL/hr over 30 Minutes Intravenous On call to O.R. 02/18/23 0507 02/18/23 1135   02/18/23 0600  ceFAZolin (ANCEF) IVPB 2g/100 mL premix  Status:  Discontinued        2 g 200 mL/hr over 30 Minutes Intravenous On call to O.R. 02/16/23 1313 02/18/23 0507   02/16/23 1430  cefTRIAXone (ROCEPHIN) 1 g in sodium chloride 0.9 % 100 mL IVPB        1 g 200 mL/hr over 30 Minutes Intravenous Every 24 hours 02/16/23 1423 02/18/23 2020       Subjective: Patient seen and examined at bedside today.  Hemodynamically stable.  Pain well-controlled.  Does not complain of any pain while at rest.  No bowel movement for last few days, no  abdominal pain or distention.  Objective: Vitals:   02/18/23 1448 02/18/23 1700 02/18/23 2155 02/19/23 0521  BP: (!) 154/81 135/70 (!) 122/59 139/66  Pulse: 86 (!) 103 100 89  Resp: 16 18 19 16   Temp:  98.5 F (36.9 C) 98.5 F (36.9 C) 98.5 F (36.9 C)  TempSrc:  Oral Oral Oral  SpO2: 95% 95% 95% 98%  Weight:      Height:        Intake/Output Summary  (Last 24 hours) at 02/19/2023 1046 Last data filed at 02/19/2023 0500 Gross per 24 hour  Intake 1100 ml  Output 2250 ml  Net -1150 ml   Filed Weights   02/16/23 1140 02/18/23 1043  Weight: 54.4 kg 54.4 kg    Examination:  General exam: Overall comfortable, not in distress HEENT: PERRL Respiratory system:  no wheezes or crackles  Cardiovascular system: S1 & S2 heard, RRR.  Gastrointestinal system: Abdomen is nondistended, soft and nontender. Central nervous system: Alert and oriented Extremities: No edema, no clubbing ,no cyanosis, surgical wound on the left hip Skin: No rashes, no ulcers,no icterus    Data Reviewed: I have personally reviewed following labs and imaging studies  CBC: Recent Labs  Lab 02/16/23 1206 02/16/23 1217 02/17/23 0357 02/18/23 0339 02/19/23 0333  WBC 16.7*  --  15.2* 12.6* 14.2*  NEUTROABS 15.4*  --   --   --   --   HGB 13.2 14.6 11.0* 10.4* 9.9*  HCT 38.8 43.0 33.1* 32.2* 29.8*  MCV 93.9  --  96.2 98.5 97.7  PLT 327  --  286 247 261   Basic Metabolic Panel: Recent Labs  Lab 02/16/23 1206 02/16/23 1217 02/16/23 1839 02/17/23 0357 02/18/23 0339 02/19/23 0333  NA 136 139  --  138 137 136  K 2.5* 2.6* 3.0* 3.6 3.2* 4.3  CL 97* 97*  --  103 104 104  CO2 25  --   --  26 24 23   GLUCOSE 156* 157*  --  110* 110* 120*  BUN 13 13  --  10 15 21   CREATININE 0.66 0.50  --  0.63 0.61 0.67  CALCIUM 9.0  --   --  8.4* 8.1* 8.0*  MG 1.6*  --   --  2.1  --   --      Recent Results (from the past 240 hour(s))  Urine Culture (for pregnant, neutropenic or urologic patients or patients with an indwelling urinary catheter)     Status: None   Collection Time: 02/17/23  9:07 AM   Specimen: Urine, Clean Catch  Result Value Ref Range Status   Specimen Description   Final    URINE, CLEAN CATCH Performed at [facility], [address], [place], [place] [address]    Special Requests   Final    NONE Performed at [facility], [address],  [place], [place] [address]    Culture   Final    NO GROWTH Performed at [facility], [address], [place], [place] [address]    Report Status 02/18/2023 FINAL  Final  MRSA Next Gen by PCR, Nasal     Status: None   Collection Time: 02/18/23  2:45 AM   Specimen: Nasal Mucosa; Nasal Swab  Result Value Ref Range Status   MRSA by PCR Next Gen NOT DETECTED NOT DETECTED Final    Comment: (NOTE) The GeneXpert MRSA Assay (FDA approved for NASAL specimens only), is one component of a comprehensive MRSA colonization surveillance  program. It is not intended to diagnose MRSA infection nor to guide or monitor treatment for MRSA infections. Test performance is not FDA approved in patients less than 87 years old. Performed at [facility], [address] [place], [place] [address]      Radiology Studies: DG Pelvis Portable  Result Date: 02/18/2023 CLINICAL DATA:  Fixation of intertrochanteric fracture EXAM: PORTABLE PELVIS 1-2 VIEWS COMPARISON:  Earlier films, 02/16/2023 FINDINGS: Intramedullary gamma nail with a proximal dynamic hip screw and a mid interlocking screw of. Good position and alignment of the intertrochanteric fracture and no complicating features associated with the hardware. The bony pelvis is intact. IMPRESSION: Good position and alignment of the intertrochanteric fracture following internal fixation. Electronically Signed   By: Rudie MeyerP.  Gallerani M.D.   On: 02/18/2023 13:34   DG FEMUR MIN 2 VIEWS LEFT  Result Date: 02/18/2023 CLINICAL DATA:  Intertrochanteric fracture LEFT femur EXAM: LEFT FEMUR 2 VIEWS COMPARISON:  Intraoperative images 4424 FINDINGS: IM nail with compression screw and locking screws in LEFT femur post ORIF of reduced intertrochanteric fracture. No dislocation. Diffuse osseous demineralization. Visualized pelvis intact. IMPRESSION: Post nailing of intertrochanteric fracture LEFT femur. Electronically Signed   By: Ulyses SouthwardMark  Boles M.D.   On: 02/18/2023 13:33   DG FEMUR MIN 2  VIEWS LEFT  Result Date: 02/18/2023 CLINICAL DATA:  Left femur nailing.  Intraoperative fluoroscopy. EXAM: LEFT FEMUR 2 VIEWS COMPARISON:  Left hip radiographs 02/16/2023 FINDINGS: Images were performed intraoperatively without the presence of a radiologist. The patient is undergoing long cephalomedullary nail fixation of the previously seen intertrochanteric fracture. Improved alignment. Total fluoroscopy images: 4 Total fluoroscopy time: 0.46 Total dose: Radiation Exposure Index (as provided by the fluoroscopic device): 9.28 mGy air Kerma Please see intraoperative findings for further detail. IMPRESSION: Fluoroscopic guidance provided for ORIF of left intertrochanteric fracture. Electronically Signed   By: Neita Garnetonald  Viola M.D.   On: 02/18/2023 13:32   DG C-Arm 1-60 Min-No Report  Result Date: 02/18/2023 Fluoroscopy was utilized by the requesting physician.  No radiographic interpretation.    Scheduled Meds:  aspirin  81 mg Oral BID   lactose free nutrition  237 mL Oral Q2000   multivitamin-lutein  1 capsule Oral Daily   polyethylene glycol  17 g Oral Daily   senna  1 tablet Oral BID   Continuous Infusions:     LOS: 3 days   Burnadette PopAmrit Dadrian Ballantine, MD [facility] P4/03/2023, 10:46 AM

## 2023-02-19 NOTE — Anesthesia Postprocedure Evaluation (Signed)
Anesthesia Post Note  Patient: Wanda [name]  Procedure(s) Performed: LEFT INTRAMEDULLARY (IM) NAIL INTERTROCHANTERIC (Left: Hip)  Patient location during evaluation: Phase II Anesthesia Type: General Level of consciousness: awake Pain management: pain level controlled Vital Signs Assessment: post-procedure vital signs reviewed and stable Respiratory status: spontaneous breathing and respiratory function stable Cardiovascular status: blood pressure returned to baseline and stable Postop Assessment: no headache and no apparent nausea or vomiting Anesthetic complications: no Comments: Late entry   No notable events documented.   Last Vitals:  Vitals:   02/18/23 2155 02/19/23 0521  BP: (!) 122/59 139/66  Pulse: 100 89  Resp: 19 16  Temp: 36.9 C 36.9 C  SpO2: 95% 98%    Last Pain:  Vitals:   02/19/23 0908  TempSrc:   PainSc: 4                  Windell Norfolk

## 2023-02-19 NOTE — Progress Notes (Signed)
Foley catheter removed. Patient sat up on the side of the bed with mod/max assist. Tolerated fair. Plan of care ongoing.

## 2023-02-19 NOTE — TOC Progression Note (Signed)
Transition of Care [facility]) - Progression Note    Patient Details  Name: Wanda [name] MRN: [id number] Date of Birth: Jan 17, 1933  Transition of Care [facility]) CM/SW Contact  Elliot Gault, LCSW Phone Number: 02/19/2023, 3:10 PM  Clinical Narrative:     TOC following. MD anticipating pt will be medically stable for dc tomorrow. Spoke with pt and son re bed offer from [facility] and they would like to accept. Updated Insurance risk surveyor at [facility]. They can admit pt tomorrow.  Weekend Fax is [phone]. Pt will go to room 111. Number for report is [phone] ask for Rehab 2.  Pt will need to be at the building before 9pm. Family anticipating need for w/c [facility] transport.  TOC will follow up in AM.  Expected Discharge Plan: Skilled Nursing Facility Barriers to Discharge: Continued Medical Work up  Expected Discharge Plan and Services In-house Referral: Clinical Social Work     Living arrangements for the past 2 months: Single Family Home                                       Social Determinants of Health (SDOH) Interventions SDOH Screenings   Tobacco Use: Low Risk  (02/18/2023)    Readmission Risk Interventions     No data to display

## 2023-02-20 DIAGNOSIS — S72002A Fracture of unspecified part of neck of left femur, initial encounter for closed fracture: Secondary | ICD-10-CM | POA: Diagnosis not present

## 2023-02-20 LAB — BASIC METABOLIC PANEL
Anion gap: 7 (ref 5–15)
BUN: 25 mg/dL — ABNORMAL HIGH (ref 8–23)
CO2: 25 mmol/L (ref 22–32)
Calcium: 8.2 mg/dL — ABNORMAL LOW (ref 8.9–10.3)
Chloride: 105 mmol/L (ref 98–111)
Creatinine, Ser: 0.61 mg/dL (ref 0.44–1.00)
GFR, Estimated: >60 mL/min (ref >60)
Glucose, Bld: 96 mg/dL (ref 70–99)
Potassium: 4 mmol/L (ref 3.5–5.1)
Sodium: 137 mmol/L (ref 135–145)

## 2023-02-20 LAB — CBC
HCT: 32.1 % — ABNORMAL LOW (ref 36.0–46.0)
Hemoglobin: 10.2 g/dL — ABNORMAL LOW (ref 12.0–15.0)
MCH: 31.6 pg (ref 26.0–34.0)
MCHC: 31.8 g/dL (ref 30.0–36.0)
MCV: 99.4 fL (ref 80.0–100.0)
Platelets: 280 10*3/uL (ref 150–400)
RBC: 3.23 MIL/uL — ABNORMAL LOW (ref 3.87–5.11)
RDW: 13.2 % (ref 11.5–15.5)
WBC: 11.3 10*3/uL — ABNORMAL HIGH (ref 4.0–10.5)
nRBC: 0 % (ref 0.0–0.2)

## 2023-02-20 MED ORDER — ASPIRIN 81 MG PO CHEW: 81.0000 mg | CHEWABLE_TABLET | Freq: Two times a day (BID) | ORAL | 0 refills | Status: AC

## 2023-02-20 MED ORDER — BISACODYL 10 MG RE SUPP
10.0000 mg | Freq: Once | RECTAL | Status: AC
  Administered 2023-02-20: 10 mg via RECTAL
  Filled 2023-02-20: qty 1

## 2023-02-20 MED ORDER — SENNA 8.6 MG PO TABS: 1.0000 | ORAL_TABLET | Freq: Two times a day (BID) | ORAL | 0 refills | Status: AC

## 2023-02-20 MED ORDER — OXYCODONE-ACETAMINOPHEN 5-325 MG PO TABS: 1.0000 | ORAL_TABLET | Freq: Four times a day (QID) | ORAL | 0 refills | Status: DC | PRN

## 2023-02-20 MED ORDER — MELATONIN 3 MG PO TABS
6.0000 mg | ORAL_TABLET | Freq: Once | ORAL | Status: AC
  Administered 2023-02-20: 6 mg via ORAL
  Filled 2023-02-20: qty 2

## 2023-02-20 MED ORDER — POLYETHYLENE GLYCOL 3350 17 G PO PACK: 17.0000 g | PACK | Freq: Every day | ORAL | 0 refills | Status: DC

## 2023-02-20 NOTE — Progress Notes (Signed)
Patient slept most of the night. Patient had two episodes of yelling out because " I thought my son was out there to pick me up". Patient reoriented.  One prn medication given during this shift. Plan of care ongoing.

## 2023-02-20 NOTE — TOC Transition Note (Signed)
Transition of Care [facility]) - CM/SW Discharge Note   Patient Details  Name: Wanda [name] MRN: [id number] Date of Birth: [birth year]  Transition of Care (TOC) CM/SW Contact:  Catalina Gravel, LCSW Phone Number: 02/20/2023, 11:21 AM   Clinical Narrative:    CSW contacted son to advise that plan is to D/C today. Nurse recommended EMS transport and not [facility] for safety concern.  Faxed script and DC summary to [facility]. CSW called EMS to arrange transport to facility.    Final next level of care: Skilled Nursing Facility Barriers to Discharge: No Barriers Identified   Patient Goals and CMS Choice      Discharge Placement                Patient chooses bed at: Other - please specify in the comment section below: [facility]) Patient to be transferred to facility by: EMS Name of family member notified: Son Deller Lippens Patient and family notified of of transfer: 02/20/23  Discharge Plan and Services Additional resources added to the After Visit Summary for   In-house Referral: Clinical Social Work                                   Social Determinants of Health (SDOH) Interventions SDOH Screenings   Tobacco Use: Low Risk  (02/18/2023)     Readmission Risk Interventions     No data to display

## 2023-02-20 NOTE — Discharge Summary (Signed)
Physician Discharge Summary  Wanda [name] ZOX:[id number] DOB: 1932-12-03 DOA: 02/16/2023  PCP: Erasmo DownerStrader, Lindsey F, NP  Admit date: 02/16/2023 Discharge date: 02/21/2023  Admitted From: Home Disposition:  SNF  Discharge Condition:Stable CODE STATUS:FULL, Diet recommendation: Regular  Brief/Interim Summary: Patient is a 89 year old female with history of hypertension, frequent UTI, vitamin B12 deficiency who was brought out by her family here after she was found on the floor, appeared to be lethargic/fatigued, more confused.  Patient was recently seen by a provider via televisit, suspected to have UTI and was given ciprofloxacin.  On presentation she was pleasantly confused.  History was taken from family.  She was hemodynamically stable.  Lab work showed potassium of 2.5, magnesium of 1.6, CK of 665, WBC of 16.7.  UA was not that reassuring for UTI. X-ray of the hip showed mildly angulated intertrochanteric left femoral fracture.  CT head/cervical spine did not show any fracture or dislocation.  Orthopedics consulted,underwent ORIF/left hip cephalomedullary nail placement on 4/4.  PT/OT recommending SNF on discharge.  Medically stable for discharge to SNF today.  Following problems were addressed during the hospitalization:  Fall/left hip fracture: Found on the floor.X-ray of the hip showed mildly angulated intertrochanteric left femoral fracture.   CT head/cervical spine did not show any fracture or dislocation.  Orthopedics consulted,underwent ORIF/left hip cephalomedullary nail placement on 4/4. PT/OT recommending SNF on DC .Started on aspirin for DVT prophylaxis.  Constipation:Continue bowel regimen.  MiraLAX and Senokot   Acute encephalopathy/UTI: Patient has history of recurrent UTI.  CT head did not show any acute findings.  No focal neurological deficits on examination.  Was recently started on antibiotic as an outpatient for suspected UTI.   UA was not reassuring, urine culture  sent.   Completed 3 days course of ceftriaxone. Currently she is fully alert and oriented, denies any dysuria.   Severe hypokalemia/hypomagnesemia: Supplemented ,now stable .Takes hydrochlorothiazide at home.  Currently stable   Elevated CK: Likely secondary to fall with mild rhabdo.  Given IV fluids   Hypertension: On lisinopril, hydrochlorothiazide at home.  Blood pressure stable without any medications now.  Currently antihypertensives on hold  Leukocytosis: Could be from UTI or could be reactive.  Significantly improved    Discharge Diagnoses:  Principal Problem:   Closed left hip fracture Active Problems:   HTN (hypertension)   Hypokalemia   UTI (urinary tract infection)   Hypomagnesemia    Discharge Instructions  Discharge Instructions     Diet general   Complete by: As directed    Discharge instructions   Complete by: As directed    1)Please take prescribed medications as instructed 2)Follow up with orthopedics as an outpatient in 2 weeks.  Name and number the provider has been attached 3)Do a CBC and BMP tests in 5 days 4)Monitor your blood pressure   Increase activity slowly   Complete by: As directed    No wound care   Complete by: As directed       Allergies as of 02/21/2023       Reactions   Sulfa Antibiotics    unknown   Naproxen Sodium Rash, Other (See Comments)   Passed out        Medication List     STOP taking these medications    Cipro 250 MG tablet Generic drug: ciprofloxacin   lisinopril-hydrochlorothiazide 20-12.5 MG tablet Commonly known as: ZESTORETIC       TAKE these medications    aspirin 81 MG chewable tablet Chew 1  tablet (81 mg total) by mouth 2 (two) times daily for 28 days.   calcium citrate-vitamin D 500-400 MG-UNIT chewable tablet Chew 2 tablets by mouth daily.   cyanocobalamin 1000 MCG/ML injection Commonly known as: VITAMIN B12 Inject 1,000 mcg into the muscle every 30 (thirty) days.   oxyCODONE-acetaminophen 5-325  MG tablet Commonly known as: Percocet Take 1 tablet by mouth every 6 (six) hours as needed.   polyethylene glycol 17 g packet Commonly known as: MIRALAX / GLYCOLAX Take 17 g by mouth daily.   senna 8.6 MG Tabs tablet Commonly known as: SENOKOT Take 1 tablet (8.6 mg total) by mouth 2 (two) times daily for 14 days.        Follow-up Information     Oliver Barre, MD. Schedule an appointment as soon as possible for a visit in 2 week(s).   Specialties: Orthopedic Surgery, Sports Medicine Contact information: [address] [place] [address] [phone]                Allergies  Allergen Reactions   Sulfa Antibiotics     unknown   Naproxen Sodium Rash and Other (See Comments)    Passed out    Consultations: Orthopedics   Procedures/Studies: DG Pelvis Portable  Result Date: 02/18/2023 CLINICAL DATA:  Fixation of intertrochanteric fracture EXAM: PORTABLE PELVIS 1-2 VIEWS COMPARISON:  Earlier films, 02/16/2023 FINDINGS: Intramedullary gamma nail with a proximal dynamic hip screw and a mid interlocking screw of. Good position and alignment of the intertrochanteric fracture and no complicating features associated with the hardware. The bony pelvis is intact. IMPRESSION: Good position and alignment of the intertrochanteric fracture following internal fixation. Electronically Signed   By: Rudie Meyer M.D.   On: 02/18/2023 13:34   DG FEMUR MIN 2 VIEWS LEFT  Result Date: 02/18/2023 CLINICAL DATA:  Intertrochanteric fracture LEFT femur EXAM: LEFT FEMUR 2 VIEWS COMPARISON:  Intraoperative images 4424 FINDINGS: IM nail with compression screw and locking screws in LEFT femur post ORIF of reduced intertrochanteric fracture. No dislocation. Diffuse osseous demineralization. Visualized pelvis intact. IMPRESSION: Post nailing of intertrochanteric fracture LEFT femur. Electronically Signed   By: Ulyses Southward M.D.   On: 02/18/2023 13:33   DG FEMUR MIN 2 VIEWS LEFT  Result Date:  02/18/2023 CLINICAL DATA:  Left femur nailing.  Intraoperative fluoroscopy. EXAM: LEFT FEMUR 2 VIEWS COMPARISON:  Left hip radiographs 02/16/2023 FINDINGS: Images were performed intraoperatively without the presence of a radiologist. The patient is undergoing long cephalomedullary nail fixation of the previously seen intertrochanteric fracture. Improved alignment. Total fluoroscopy images: 4 Total fluoroscopy time: 0.46 Total dose: Radiation Exposure Index (as provided by the fluoroscopic device): 9.28 mGy air Kerma Please see intraoperative findings for further detail. IMPRESSION: Fluoroscopic guidance provided for ORIF of left intertrochanteric fracture. Electronically Signed   By: Neita Garnet M.D.   On: 02/18/2023 13:32   DG C-Arm 1-60 Min-No Report  Result Date: 02/18/2023 Fluoroscopy was utilized by the requesting physician.  No radiographic interpretation.   CT HEAD WO CONTRAST  Result Date: 02/16/2023 CLINICAL DATA:  Found down.  Trauma. EXAM: CT HEAD WITHOUT CONTRAST CT CERVICAL SPINE WITHOUT CONTRAST TECHNIQUE: Multidetector CT imaging of the head and cervical spine was performed following the standard protocol without intravenous contrast. Multiplanar CT image reconstructions of the cervical spine were also generated. RADIATION DOSE REDUCTION: This exam was performed according to the departmental dose-optimization program which includes automated exposure control, adjustment of the mA and/or kV according to patient size and/or use  of iterative reconstruction technique. COMPARISON:  None Available. FINDINGS: CT HEAD FINDINGS Brain: No acute intracranial hemorrhage. Mild chronic small-vessel disease. Gray-white differentiation is preserved. No hydrocephalus or extra-axial collection. No mass effect or midline shift. Vascular: No hyperdense vessel or unexpected calcification. Skull: No calvarial fracture or suspicious bone lesion. Skull base is unremarkable. Sinuses/Orbits: Unremarkable. Other: None.  CT CERVICAL SPINE FINDINGS Alignment: Normal. Skull base and vertebrae: No acute fracture. Normal craniocervical junction. No suspicious bone lesions. Soft tissues and spinal canal: No prevertebral fluid or swelling. No visible canal hematoma. Disc levels: Mild cervical spondylosis without high-grade spinal canal stenosis. Upper chest: Unremarkable. Other: Atherosclerotic calcifications of the carotid bulbs. IMPRESSION: 1. No acute intracranial process. 2. No acute cervical spine fracture or traumatic malalignment. Electronically Signed   By: Orvan FalconerWalter  Wiggins M.D.   On: 02/16/2023 13:03   CT CERVICAL SPINE WO CONTRAST  Result Date: 02/16/2023 CLINICAL DATA:  Found down.  Trauma. EXAM: CT HEAD WITHOUT CONTRAST CT CERVICAL SPINE WITHOUT CONTRAST TECHNIQUE: Multidetector CT imaging of the head and cervical spine was performed following the standard protocol without intravenous contrast. Multiplanar CT image reconstructions of the cervical spine were also generated. RADIATION DOSE REDUCTION: This exam was performed according to the departmental dose-optimization program which includes automated exposure control, adjustment of the mA and/or kV according to patient size and/or use of iterative reconstruction technique. COMPARISON:  None Available. FINDINGS: CT HEAD FINDINGS Brain: No acute intracranial hemorrhage. Mild chronic small-vessel disease. Gray-white differentiation is preserved. No hydrocephalus or extra-axial collection. No mass effect or midline shift. Vascular: No hyperdense vessel or unexpected calcification. Skull: No calvarial fracture or suspicious bone lesion. Skull base is unremarkable. Sinuses/Orbits: Unremarkable. Other: None. CT CERVICAL SPINE FINDINGS Alignment: Normal. Skull base and vertebrae: No acute fracture. Normal craniocervical junction. No suspicious bone lesions. Soft tissues and spinal canal: No prevertebral fluid or swelling. No visible canal hematoma. Disc levels: Mild cervical  spondylosis without high-grade spinal canal stenosis. Upper chest: Unremarkable. Other: Atherosclerotic calcifications of the carotid bulbs. IMPRESSION: 1. No acute intracranial process. 2. No acute cervical spine fracture or traumatic malalignment. Electronically Signed   By: Orvan FalconerWalter  Wiggins M.D.   On: 02/16/2023 13:03   DG Chest Portable 1 View  Result Date: 02/16/2023 CLINICAL DATA:  Unwitnessed fall EXAM: PORTABLE CHEST 1 VIEW COMPARISON:  Chest radiograph dated 04/28/2022 FINDINGS: Normal lung volumes. Left basilar patchy opacity. No pleural effusion or pneumothorax. The heart size and mediastinal contours are within normal limits. No radiographic finding of acute displaced fracture. IMPRESSION: 1. Left basilar patchy opacity, likely atelectasis. 2. No radiographic finding of acute displaced fracture. Electronically Signed   By: Agustin CreeLimin  Xu M.D.   On: 02/16/2023 12:06   DG Hip Unilat With Pelvis 2-3 Views Left  Result Date: 02/16/2023 CLINICAL DATA:  Unwitnessed fall with left upper leg pain EXAM: DG HIP (WITH OR WITHOUT PELVIS) 2V LEFT COMPARISON:  Left hip radiographs dated 11/04/2018 FINDINGS: Mildly angulated intertrochanteric left femoral fracture. The femoroacetabular joint is maintained. Degenerative changes of the bilateral hips and partially imaged lumbar spine. Otherwise no acute displaced pelvic fracture. Vascular calcifications. IMPRESSION: Mildly angulated intertrochanteric left femoral fracture. Electronically Signed   By: Agustin CreeLimin  Xu M.D.   On: 02/16/2023 12:05      Subjective: Patient seen and examined at bedside today.  Hemodynamically stable.  Son at bedside.  She was trying to have a bowel movement. Ordered Dulcolax suppository.  Denies any abdomen pain.  Abdomen is soft and nondistended and nontender.  We  discussed about discharge planning to skilled nursing facility today.  Discharge Exam: Vitals:   02/20/23 1930 02/21/23 0403  BP: (!) 157/67 137/70  Pulse: 83 89  Resp: 16 18   Temp: 99.1 F (37.3 C) 98.2 F (36.8 C)  SpO2: 96% 98%   Vitals:   02/20/23 0417 02/20/23 1554 02/20/23 1930 02/21/23 0403  BP: 115/83 (!) 146/78 (!) 157/67 137/70  Pulse: 78 88 83 89  Resp: 18 16 16 18   Temp: 98.2 F (36.8 C) 98.4 F (36.9 C) 99.1 F (37.3 C) 98.2 F (36.8 C)  TempSrc: Oral Oral    SpO2: 99% 98% 96% 98%  Weight:      Height:        General: Pt is alert, awake, not in acute distress Cardiovascular: RRR, S1/S2 +, no rubs, no gallops Respiratory: CTA bilaterally, no wheezing, no rhonchi Abdominal: Soft, NT, ND, bowel sounds + Extremities: no edema, no cyanosis, clean surgical wound on the left hip    The results of significant diagnostics from this hospitalization (including imaging, microbiology, ancillary and laboratory) are listed below for reference.     Microbiology: Recent Results (from the past 240 hour(s))  Urine Culture (for pregnant, neutropenic or urologic patients or patients with an indwelling urinary catheter)     Status: None   Collection Time: 02/17/23  9:07 AM   Specimen: Urine, Clean Catch  Result Value Ref Range Status   Specimen Description   Final    URINE, CLEAN CATCH Performed at [facility], [address] [place], [place] [address]    Special Requests   Final    NONE Performed at [facility], [address], [place], [place] [address]    Culture   Final    NO GROWTH Performed at [facility], [address] [place], [place] [address]    Report Status 02/18/2023 FINAL  Final  MRSA Next Gen by PCR, Nasal     Status: None   Collection Time: 02/18/23  2:45 AM   Specimen: Nasal Mucosa; Nasal Swab  Result Value Ref Range Status   MRSA by PCR Next Gen NOT DETECTED NOT DETECTED Final    Comment: (NOTE) The GeneXpert MRSA Assay (FDA approved for NASAL specimens only), is one component of a comprehensive MRSA colonization surveillance program. It is not intended to diagnose MRSA infection nor to guide or monitor  treatment for MRSA infections. Test performance is not FDA approved in patients less than 47 years old. Performed at [facility], [address] [place], [place] [address]      Labs: BNP (last 3 results) No results for input(s): "BNP" in the last 8760 hours. Basic Metabolic Panel: Recent Labs  Lab 02/16/23 1206 02/16/23 1217 02/16/23 1839 02/17/23 0357 02/18/23 0339 02/19/23 0333 02/20/23 0359  NA 136 139  --  138 137 136 137  K 2.5* 2.6* 3.0* 3.6 3.2* 4.3 4.0  CL 97* 97*  --  103 104 104 105  CO2 25  --   --  26 24 23 25   GLUCOSE 156* 157*  --  110* 110* 120* 96  BUN 13 13  --  10 15 21  25*  CREATININE 0.66 0.50  --  0.63 0.61 0.67 0.61  CALCIUM 9.0  --   --  8.4* 8.1* 8.0* 8.2*  MG 1.6*  --   --  2.1  --   --   --    Liver Function Tests: Recent Labs  Lab 02/16/23 1206  AST 46*  ALT 27  ALKPHOS 134*  BILITOT 2.3*  PROT 7.8  ALBUMIN 4.1   No results for input(s): "LIPASE", "AMYLASE" in the last 168 hours. No results for input(s): "AMMONIA" in the last 168 hours. CBC: Recent Labs  Lab 02/16/23 1206 02/16/23 1217 02/17/23 0357 02/18/23 0339 02/19/23 0333 02/20/23 0359  WBC 16.7*  --  15.2* 12.6* 14.2* 11.3*  NEUTROABS 15.4*  --   --   --   --   --   HGB 13.2 14.6 11.0* 10.4* 9.9* 10.2*  HCT 38.8 43.0 33.1* 32.2* 29.8* 32.1*  MCV 93.9  --  96.2 98.5 97.7 99.4  PLT 327  --  286 247 261 280   Cardiac Enzymes: Recent Labs  Lab 02/16/23 1206 02/17/23 0357  CKTOTAL 665* 430*   BNP: Invalid input(s): "POCBNP" CBG: No results for input(s): "GLUCAP" in the last 168 hours. D-Dimer No results for input(s): "DDIMER" in the last 72 hours. Hgb A1c No results for input(s): "HGBA1C" in the last 72 hours. Lipid Profile No results for input(s): "CHOL", "HDL", "LDLCALC", "TRIG", "CHOLHDL", "LDLDIRECT" in the last 72 hours. Thyroid function studies No results for input(s): "TSH", "T4TOTAL", "T3FREE", "THYROIDAB" in the last 72 hours.  Invalid input(s):  "FREET3" Anemia work up No results for input(s): "VITAMINB12", "FOLATE", "FERRITIN", "TIBC", "IRON", "RETICCTPCT" in the last 72 hours. Urinalysis    Component Value Date/Time   COLORURINE STRAW (A) 02/17/2023 0907   APPEARANCEUR CLEAR 02/17/2023 0907   LABSPEC 1.005 02/17/2023 0907   PHURINE 7.0 02/17/2023 0907   GLUCOSEU NEGATIVE 02/17/2023 0907   HGBUR MODERATE (A) 02/17/2023 0907   BILIRUBINUR NEGATIVE 02/17/2023 0907   KETONESUR NEGATIVE 02/17/2023 0907   PROTEINUR NEGATIVE 02/17/2023 0907   UROBILINOGEN 0.2 05/11/2012 1000   NITRITE NEGATIVE 02/17/2023 0907   LEUKOCYTESUR NEGATIVE 02/17/2023 0907   Sepsis Labs Recent Labs  Lab 02/17/23 0357 02/18/23 0339 02/19/23 0333 02/20/23 0359  WBC 15.2* 12.6* 14.2* 11.3*   Microbiology Recent Results (from the past 240 hour(s))  Urine Culture (for pregnant, neutropenic or urologic patients or patients with an indwelling urinary catheter)     Status: None   Collection Time: 02/17/23  9:07 AM   Specimen: Urine, Clean Catch  Result Value Ref Range Status   Specimen Description   Final    URINE, CLEAN CATCH Performed at [facility], [address], [place], [place] [address]    Special Requests   Final    NONE Performed at [facility], [address] [place], [place] [address]    Culture   Final    NO GROWTH Performed at [facility], [address] [place], [place] [address]    Report Status 02/18/2023 FINAL  Final  MRSA Next Gen by PCR, Nasal     Status: None   Collection Time: 02/18/23  2:45 AM   Specimen: Nasal Mucosa; Nasal Swab  Result Value Ref Range Status   MRSA by PCR Next Gen NOT DETECTED NOT DETECTED Final    Comment: (NOTE) The GeneXpert MRSA Assay (FDA approved for NASAL specimens only), is one component of a comprehensive MRSA colonization surveillance program. It is not intended to diagnose MRSA infection nor to guide or monitor treatment for MRSA infections. Test performance is not FDA approved  in patients less than 77 years old. Performed at [facility], [address], [place], [place] [address]     Please note: You were cared for by a hospitalist during your hospital stay. Once  you are discharged, your primary care physician will handle any further medical issues. Please note that NO REFILLS for any discharge medications will be authorized once you are discharged, as it is imperative that you return to your primary care physician (or establish a relationship with a primary care physician if you do not have one) for your post hospital discharge needs so that they can reassess your need for medications and monitor your lab values.    Time coordinating discharge: 40 minutes  SIGNED:   Burnadette Pop, MD  [facility] 02/21/2023, 9:20 AM Pager [phone]  If 7PM-7AM, please contact night-coverage [email or web address] Password [email or web address]

## 2023-02-21 NOTE — Progress Notes (Signed)
Wanda [name]  Admit Date:  02/16/2023 Discharge date: 02/21/2023   Remus Loffler to be D/C'd Skilled nursing facility per MD order.  AVS completed. Patient/caregiver able to verbalize understanding.  Discharge Medication: Allergies as of 02/21/2023       Reactions   Sulfa Antibiotics    unknown   Naproxen Sodium Rash, Other (See Comments)   Passed out        Medication List     STOP taking these medications    Cipro 250 MG tablet Generic drug: ciprofloxacin   lisinopril-hydrochlorothiazide 20-12.5 MG tablet Commonly known as: ZESTORETIC       TAKE these medications    aspirin 81 MG chewable tablet Chew 1 tablet (81 mg total) by mouth 2 (two) times daily for 28 days.   calcium citrate-vitamin D 500-400 MG-UNIT chewable tablet Chew 2 tablets by mouth daily.   cyanocobalamin 1000 MCG/ML injection Commonly known as: VITAMIN B12 Inject 1,000 mcg into the muscle every 30 (thirty) days.   oxyCODONE-acetaminophen 5-325 MG tablet Commonly known as: Percocet Take 1 tablet by mouth every 6 (six) hours as needed.   polyethylene glycol 17 g packet Commonly known as: MIRALAX / GLYCOLAX Take 17 g by mouth daily.   senna 8.6 MG Tabs tablet Commonly known as: SENOKOT Take 1 tablet (8.6 mg total) by mouth 2 (two) times daily for 14 days.        Discharge Assessment: Vitals:   02/20/23 1930 02/21/23 0403  BP: (!) 157/67 137/70  Pulse: 83 89  Resp: 16 18  Temp: 99.1 F (37.3 C) 98.2 F (36.8 C)  SpO2: 96% 98%   Skin clean, dry and intact without evidence of skin break down, no evidence of skin tears noted. IV catheter discontinued intact. Site without signs and symptoms of complications - no redness or edema noted at insertion site, patient denies c/o pain - only slight tenderness at site.  Dressing with slight pressure applied.  D/c Instructions-Education: Discharge instructions given to patient/family with verbalized understanding. D/c education completed  with patient/family including follow up instructions, medication list, d/c activities limitations if indicated, with other d/c instructions as indicated by MD - patient able to verbalize understanding, all questions fully answered. Patient instructed to return to ED, call 911, or call MD for any changes in condition.  Patient escorted via stretcher to SNF by EMS.  Cristal Ford, LPN 07/22/4382 8:18 AM

## 2023-02-21 NOTE — Progress Notes (Signed)
Patient seen and examined at the bedside today.  Yesterday she was not discharged be due to transportation issues.  She is medically stable for discharge today.  No new complaints.  She had a bowel movement.  No change in the medical management.

## 2023-02-21 NOTE — TOC Progression Note (Signed)
Transition of Care [facility]) - Progression Note    Patient Details  Name: Wanda [name] MRN: [id number] Date of Birth: Jun 11, 1933  [facility] of [facility]) CM/SW Contact  Elliot Gault, LCSW Phone Number: 02/21/2023, 9:44 AM  Clinical Narrative:     EMS was not able to pick up pt yesterday. Spoke with SNF today and pt can admit to them today. Updated DC summary faxed. RN will call report.   EMS here this AM to transport. Son here and aware.   No other TOC needs.  Expected Discharge Plan: Skilled Nursing Facility Barriers to Discharge: No Barriers Identified  Expected Discharge Plan and Services In-house Referral: Clinical Social Work     Living arrangements for the past 2 months: Single Family Home Expected Discharge Date: 02/20/23                                     Social Determinants of Health (SDOH) Interventions SDOH Screenings   Tobacco Use: Low Risk  (02/18/2023)    Readmission Risk Interventions     No data to display

## 2023-02-22 ENCOUNTER — Telehealth: Payer: Self-pay | Admitting: Orthopedic Surgery

## 2023-02-22 NOTE — Telephone Encounter (Signed)
Called [facility], spoke with Darius, he works with [employer], read Dr. Dallas Schimke instructions and he verbalized that he understood them.

## 2023-02-22 NOTE — Telephone Encounter (Signed)
Spoke w/Jennifer w/[facility] [phone] ext [phone], explained since the patient is in a nursing facility that they can remove the staples in two weeks from surgery, 03/04/23 per the OP note and I scheduled her an appointment for 04/02/23, six weeks from surgery.  She is requesting wound care instructions, she would like to know if they need to keep the wound dry and if they need to change the dressing daily.

## 2023-02-26 ENCOUNTER — Encounter (HOSPITAL_COMMUNITY): Payer: Self-pay | Admitting: Orthopedic Surgery

## 2023-03-05 ENCOUNTER — Encounter: Payer: Medicare Other | Admitting: Orthopedic Surgery

## 2023-03-29 ENCOUNTER — Encounter: Payer: Self-pay | Admitting: Physician Assistant

## 2023-04-02 ENCOUNTER — Other Ambulatory Visit (INDEPENDENT_AMBULATORY_CARE_PROVIDER_SITE_OTHER): Payer: Medicare Other

## 2023-04-02 ENCOUNTER — Encounter: Payer: Self-pay | Admitting: Orthopedic Surgery

## 2023-04-02 ENCOUNTER — Ambulatory Visit (INDEPENDENT_AMBULATORY_CARE_PROVIDER_SITE_OTHER): Payer: Medicare Other | Admitting: Orthopedic Surgery

## 2023-04-02 VITALS — Ht <= 58 in | Wt 115.0 lb

## 2023-04-02 DIAGNOSIS — Z9889 Other specified postprocedural states: Secondary | ICD-10-CM

## 2023-04-02 NOTE — Progress Notes (Signed)
Orthopaedic Postop Note  Assessment: Wanda [name] is a 89 y.o. female s/p cephalomedullary nail for Left intertrochanteric femur fracture  DOS: 02/18/2023  Plan: Surgical incision is healing well. Continue with protective WBAT Continue to work with home health physical therapy. Walking is excellent therapy Follow up in 8 weeks; call with any issues   Follow-up: Return in about 6 weeks (around 05/14/2023). XR at next visit: AP pelvis and Left femur  Subjective:  Chief Complaint  Patient presents with   Routine Post Op    L hip DOS 02/18/23    History of Present Illness: Wanda [name] is a 89 y.o. female who presents following the above stated procedure.  He was approximately 6 weeks ago.  She was discharged to a facility, after dressings were removed, and staples removed around 2 weeks after surgery.  She has just recently been discharged from the facility.  She denies pain.  She is ambulating well with a walker.  No issues with her incisions.  She is pleased with her improvements thus far.  Review of Systems: No fevers or chills No numbness or tingling No Chest Pain No shortness of breath   Objective: Ht 4\' 8"  (1.422 m)   Wt 115 lb (52.2 kg)   BMI 25.78 kg/m   Physical Exam:  Alert and oriented.  No acute distress.  Ambulates well using a walker.  Surgical incisions are healing well.  No surrounding erythema or drainage.  Able to maintain a straight leg raise.  Active motion intact in the TA/EHL.  Toes are warm and well-perfused.   IMAGING: I personally ordered and reviewed the following images:  XR of the Left femur and AP pelvis demonstrates a well positioned cephalomedullary nail.   The intertrochanteric femur fracture remains in stable position.  There is no evidence of implant subsidence.  No acute fractures are noted.  Impression: Left intertrochanteric femur fracture in stable position without evidence of subsidence.   Oliver Barre,  MD 04/02/2023 12:06 PM

## 2023-04-06 ENCOUNTER — Ambulatory Visit (INDEPENDENT_AMBULATORY_CARE_PROVIDER_SITE_OTHER): Payer: Medicare Other | Admitting: Gastroenterology

## 2023-04-06 ENCOUNTER — Encounter: Payer: Self-pay | Admitting: Gastroenterology

## 2023-04-06 VITALS — BP 139/78 | HR 102 | Temp 98.6°F | Ht <= 58 in | Wt 115.2 lb

## 2023-04-06 DIAGNOSIS — D509 Iron deficiency anemia, unspecified: Secondary | ICD-10-CM

## 2023-04-06 DIAGNOSIS — A09 Infectious gastroenteritis and colitis, unspecified: Secondary | ICD-10-CM | POA: Diagnosis not present

## 2023-04-06 NOTE — Progress Notes (Signed)
GI Office Note    Referring Provider: Erasmo Downer, NP Primary Care Physician:  Erasmo Downer, NP  Primary Gastroenterologist: Roetta Sessions, MD   Chief Complaint   Chief Complaint  Patient presents with   Diarrhea    Been having diarrhea.     History of Present Illness   Wanda [name] is a 89 y.o. female presenting today for further evaluation of intermittent diarrhea.  Patient last seen in October 2022.  She has a history of large hiatal hernia with Sheria Lang lesions producing intermittent GI bleeding.  She underwent a robotic assisted repair of a hiatal hernia and toupee antireflux procedure.  Patient with recent admission last month after she was found on the floor, lethargic, confused.  X-ray showed mildly angulated intertrochanteric left femoral fracture.  She underwent ORIF/left hip surgery.  Completed rehabilitation at SNF, discharged last week.  Acute encephalopathy felt to be due to recurrent UTI.  While in rehab, she had issues with diarrhea and fecal incontinence. Happened several times. Determined she was getting stool softeners and laxatives at SNF. Medications were stopped but continued to have some episodes. She has had couple of episodes since she was discharged to home. She reports that last couple of BMs however have been more normal, and actually had to sit awhile to have a stool.   She has had issues with diarrhea and fecal incontinence even before the hip fractures. Part of the reason she wears depends. Family worries this may be contributing to her recurrent UTIs. At one point they were concerned Aricept may be causing, but tried being off medication and it made no difference. Can have episodes every other day at times. Occurring for several months.   No abdominal pain, vomiting, melena, brbpr.     Medications   Current Outpatient Medications  Medication Sig Dispense Refill   Calcium Carb-Cholecalciferol (CALCIUM 500/D PO) Take 1,000 mg by  mouth daily.     calcium citrate-vitamin D 500-400 MG-UNIT chewable tablet Chew 2 tablets by mouth daily.     cyanocobalamin (,VITAMIN B-12,) 1000 MCG/ML injection Inject 1,000 mcg into the muscle every 30 (thirty) days.     donepezil (ARICEPT) 5 MG tablet Take 5 mg by mouth at bedtime.     No current facility-administered medications for this visit.    Allergies   Allergies as of 04/06/2023 - Review Complete 04/06/2023  Allergen Reaction Noted   Sulfa antibiotics  06/23/2016   Naproxen sodium Rash and Other (See Comments)     Review of Systems   General: Negative for anorexia, weight loss, fever, chills, fatigue, weakness. ENT: Negative for hoarseness, difficulty swallowing , nasal congestion. CV: Negative for chest pain, angina, palpitations, dyspnea on exertion, peripheral edema.  Respiratory: Negative for dyspnea at rest, dyspnea on exertion, cough, sputum, wheezing.  GI: See history of present illness. GU:  Negative for dysuria, hematuria, urinary incontinence, urinary frequency, nocturnal urination.  Endo: Negative for unusual weight change.     Physical Exam   BP 139/78 (BP Location: Right Arm, Patient Position: Sitting, Cuff Size: Normal)   Pulse (!) 102   Temp 98.6 F (37 C) (Temporal)   Ht 4\' 10"  (1.473 m)   Wt 115 lb 3.2 oz (52.3 kg)   SpO2 97%   BMI 24.08 kg/m    General: Well-nourished, well-developed in no acute distress. Son present Eyes: No icterus. Mouth: Oropharyngeal mucosa moist and pink   Lungs: Clear to auscultation bilaterally.  Heart: Regular rate and  rhythm, no murmurs rubs or gallops.  Abdomen: Bowel sounds are normal, nontender, nondistended, no hepatosplenomegaly or masses,  no abdominal bruits or hernia , no rebound or guarding.  Rectal: not performed Extremities: No lower extremity edema. No clubbing or deformities. Neuro: Alert and oriented x 4   Skin: Warm and dry, no jaundice.   Psych: Alert and cooperative, normal mood and  affect.  Labs   Lab Results  Component Value Date   CREATININE 0.61 02/20/2023   BUN 25 (H) 02/20/2023   NA 137 02/20/2023   K 4.0 02/20/2023   CL 105 02/20/2023   CO2 25 02/20/2023   Lab Results  Component Value Date   ALT 27 02/16/2023   AST 46 (H) 02/16/2023   ALKPHOS 134 (H) 02/16/2023   BILITOT 2.3 (H) 02/16/2023   Lab Results  Component Value Date   WBC 11.3 (H) 02/20/2023   HGB 10.2 (L) 02/20/2023   HCT 32.1 (L) 02/20/2023   MCV 99.4 02/20/2023   PLT 280 02/20/2023      Imaging Studies   DG FEMUR MIN 2 VIEWS LEFT  Result Date: 04/04/2023 XR of the Left femur and AP pelvis demonstrates a well positioned cephalomedullary nail.   The intertrochanteric femur fracture remains in stable position.  There is no evidence of implant subsidence.  No acute fractures are noted.  Impression: Left intertrochanteric femur fracture in stable position without evidence of subsidence.   DG Pelvis 1-2 Views  Result Date: 04/04/2023 XR of the Left femur and AP pelvis demonstrates a well positioned cephalomedullary nail.   The intertrochanteric femur fracture remains in stable position.  There is no evidence of implant subsidence.  No acute fractures are noted.  Impression: Left intertrochanteric femur fracture in stable position without evidence of subsidence.    Assessment   Diarrhea: recommend ruling out Cdiff as well as other infectious etiology given recent hospital and SNF stay and history of recurrent antibiotic use.   Anemia: update labs at this time. Denies overt GI bleeding.   PLAN   Complete labs/stools. Start fiber supplement, 3-4 grams per day.    Leanna Battles. Melvyn Neth, MHS, PA-C [facility]

## 2023-04-06 NOTE — Patient Instructions (Signed)
Please complete labs and stool tests. Start a fiber supplement daily. Try to get 3-4 grams per day. Benefiber powder or chewable, Fiberchoice chewable are good options. Avoid fiber pills as they do not have much fiber in them.

## 2023-04-15 ENCOUNTER — Emergency Department (HOSPITAL_COMMUNITY)
Admission: EM | Admit: 2023-04-15 | Discharge: 2023-04-15 | Disposition: A | Discharge status: Home / Self Care | Payer: Medicare Other | Attending: Emergency Medicine | Admitting: Emergency Medicine

## 2023-04-15 ENCOUNTER — Other Ambulatory Visit: Payer: Self-pay

## 2023-04-15 ENCOUNTER — Encounter (HOSPITAL_COMMUNITY): Payer: Self-pay | Admitting: *Deleted

## 2023-04-15 DIAGNOSIS — Z79899 Other long term (current) drug therapy: Secondary | ICD-10-CM | POA: Diagnosis not present

## 2023-04-15 DIAGNOSIS — I10 Essential (primary) hypertension: Secondary | ICD-10-CM

## 2023-04-15 LAB — BASIC METABOLIC PANEL
Anion gap: 8 (ref 5–15)
BUN: 17 mg/dL (ref 8–23)
CO2: 25 mmol/L (ref 22–32)
Calcium: 9.2 mg/dL (ref 8.9–10.3)
Chloride: 105 mmol/L (ref 98–111)
Creatinine, Ser: 0.64 mg/dL (ref 0.44–1.00)
GFR, Estimated: >60 mL/min (ref >60)
Glucose, Bld: 104 mg/dL — ABNORMAL HIGH (ref 70–99)
Potassium: 4 mmol/L (ref 3.5–5.1)
Sodium: 138 mmol/L (ref 135–145)

## 2023-04-15 LAB — CBC
HCT: 37 % (ref 36.0–46.0)
Hemoglobin: 11.8 g/dL — ABNORMAL LOW (ref 12.0–15.0)
MCH: 30.6 pg (ref 26.0–34.0)
MCHC: 31.9 g/dL (ref 30.0–36.0)
MCV: 96.1 fL (ref 80.0–100.0)
Platelets: 348 10*3/uL (ref 150–400)
RBC: 3.85 MIL/uL — ABNORMAL LOW (ref 3.87–5.11)
RDW: 13.4 % (ref 11.5–15.5)
WBC: 9.5 10*3/uL (ref 4.0–10.5)
nRBC: 0 % (ref 0.0–0.2)

## 2023-04-15 LAB — TROPONIN I (HIGH SENSITIVITY): Troponin I (High Sensitivity): 8 ng/L (ref <18)

## 2023-04-15 LAB — MAGNESIUM: Magnesium: 1.9 mg/dL (ref 1.7–2.4)

## 2023-04-15 MED ORDER — AMLODIPINE BESYLATE 5 MG PO TABS
5.0000 mg | ORAL_TABLET | Freq: Once | ORAL | Status: AC
  Administered 2023-04-15: 5 mg via ORAL
  Filled 2023-04-15: qty 1

## 2023-04-15 MED ORDER — AMLODIPINE BESYLATE 5 MG PO TABS: 5.0000 mg | ORAL_TABLET | Freq: Every day | ORAL | 0 refills | Status: AC

## 2023-04-15 NOTE — ED Provider Notes (Signed)
[place] EMERGENCY DEPARTMENT AT [facility] Provider Note   CSN: [id number] Arrival date & time: 04/15/23  1726     History  Chief Complaint  Patient presents with   Hypertension    Wanda [name] is a 89 y.o. female.  Pt is an 89 yo female with pmhx significant for anemia, gerd, gastritis, and htn.  Pt was admitted for a hip fx in April.  While admitted, she was taken off her bp meds b/c bp was good.  Bp stayed ok at the rehab facility, so she was not d/c with bp meds.  Since being home for the past week, she has been eating frozen meals and processed foods.  Bp readings have been high this week.  She feels fine.  Pt was on lisinopril-hctz, but k was very low when she was admitted.  After stopping the hctz and supplementing, her k normalized.         Home Medications Prior to Admission medications   Medication Sig Start Date End Date Taking? Authorizing Provider  amLODipine (NORVASC) 5 MG tablet Take 1 tablet (5 mg total) by mouth daily. 04/15/23  Yes Jacalyn Lefevre, MD  amoxicillin-clavulanate (AUGMENTIN) 875-125 MG tablet Take 1 tablet by mouth 2 (two) times daily. 04/13/23  Yes [provider]  Calcium Carb-Cholecalciferol (CALCIUM 500/D PO) Take 1,000 mg by mouth daily.   Yes [provider]  Cholecalciferol (VITAMIN D-3 PO) Take 1 tablet by mouth daily.   Yes [provider]  cyanocobalamin (,VITAMIN B-12,) 1000 MCG/ML injection Inject 1,000 mcg into the muscle every 30 (thirty) days. 01/10/21  Yes [provider]  donepezil (ARICEPT) 5 MG tablet Take 5 mg by mouth at bedtime.   Yes [provider]  FIBER PO Take 4 mg by mouth daily.   Yes [provider]  nitrofurantoin (MACRODANTIN) 100 MG capsule Take 100 mg by mouth 2 (two) times daily. Patient not taking: Reported on 04/15/2023 04/07/23   [provider]      Allergies    Sulfa antibiotics and Naproxen sodium    Review of Systems   Review of  Systems  All other systems reviewed and are negative.   Physical Exam Updated Vital Signs BP (!) 163/78   Pulse 84   Temp 97.9 F (36.6 C) (Oral)   Resp (!) 22   Ht 4\' 10"  (1.473 m)   Wt 52.2 kg   SpO2 99%   BMI 24.04 kg/m  Physical Exam Vitals and nursing note reviewed.  Constitutional:      Appearance: Normal appearance.  HENT:     Head: Normocephalic and atraumatic.     Right Ear: External ear normal.     Left Ear: External ear normal.     Nose: Nose normal.     Mouth/Throat:     Mouth: Mucous membranes are moist.     Pharynx: Oropharynx is clear.  Eyes:     Extraocular Movements: Extraocular movements intact.     Pupils: Pupils are equal, round, and reactive to light.  Cardiovascular:     Rate and Rhythm: Normal rate and regular rhythm.     Pulses: Normal pulses.     Heart sounds: Normal heart sounds.  Pulmonary:     Effort: Pulmonary effort is normal.     Breath sounds: Normal breath sounds.  Abdominal:     General: Abdomen is flat. Bowel sounds are normal.     Palpations: Abdomen is soft.  Musculoskeletal:  General: Normal range of motion.     Cervical back: Normal range of motion and neck supple.  Skin:    General: Skin is warm.     Capillary Refill: Capillary refill takes less than 2 seconds.  Neurological:     General: No focal deficit present.     Mental Status: She is alert and oriented to person, place, and time.  Psychiatric:        Mood and Affect: Mood normal.        Behavior: Behavior normal.     ED Results / Procedures / Treatments   Labs (all labs ordered are listed, but only abnormal results are displayed) Labs Reviewed  BASIC METABOLIC PANEL - Abnormal; Notable for the following components:      Result Value   Glucose, Bld 104 (*)    All other components within normal limits  CBC - Abnormal; Notable for the following components:   RBC 3.85 (*)    Hemoglobin 11.8 (*)    All other components within normal limits  MAGNESIUM   TROPONIN I (HIGH SENSITIVITY)  TROPONIN I (HIGH SENSITIVITY)    EKG EKG Interpretation  Date/Time:  Thursday Apr 15 2023 17:42:57 EDT Ventricular Rate:  79 PR Interval:  163 QRS Duration: 90 QT Interval:  422 QTC Calculation: 484 R Axis:   -3 Text Interpretation: Sinus rhythm Nonspecific T abnormalities, inferior leads No significant change since last tracing Confirmed by Jacalyn Lefevre [id number]) on 04/15/2023 6:07:36 PM  Radiology No results found.  Procedures Procedures    Medications Ordered in ED Medications  amLODipine (NORVASC) tablet 5 mg (5 mg Oral Given 04/15/23 1813)    ED Course/ Medical Decision Making/ A&P                             Medical Decision Making Amount and/or Complexity of Data Reviewed Labs: ordered.  Risk Prescription drug management.   This patient presents to the ED for concern of htn, this involves an extensive number of treatment options, and is a complaint that carries with it a high risk of complications and morbidity.  The differential diagnosis includes htn, electrolyte abn   Co morbidities that complicate the patient evaluation  nemia, gerd, gastritis, and htn   Additional history obtained:  Additional history obtained from epic chart review External records from outside source obtained and reviewed including son   Lab Tests:  I Ordered, and personally interpreted labs.  The pertinent results include:  bmp nl, mg nl, cbc with chronic anemia, trop nl   Cardiac Monitoring:  The patient was maintained on a cardiac monitor.  I personally viewed and interpreted the cardiac monitored which showed an underlying rhythm of: nsr   Medicines ordered and prescription drug management:  I ordered medication including amlodipine  for htn  Reevaluation of the patient after these medicines showed that the patient improved I have reviewed the patients home medicines and have made adjustments as needed  Problem List / ED  Course:  Htn:  pt has been off her bp meds and has been eating processed foods.  I am going to put her on amlodipine 5 mg and have her eat a DASH diet.  Pt is to f/u with pcp for repeat bp.  Return if worse.     Reevaluation:  After the interventions noted above, I reevaluated the patient and found that they have :improved   Social Determinants of Health:  Lives at  home   Dispostion:  After consideration of the diagnostic results and the patients response to treatment, I feel that the patent would benefit from discharge with outpatient f/u.          Final Clinical Impression(s) / ED Diagnoses Final diagnoses:  Hypertension, unspecified type    Rx / DC Orders ED Discharge Orders          Ordered    amLODipine (NORVASC) 5 MG tablet  Daily        04/15/23 1934              Jacalyn Lefevre, MD 04/15/23 [phone]

## 2023-04-15 NOTE — ED Triage Notes (Signed)
Pt noted with elevated BP at home. Pt denies any CP or HA.

## 2023-04-23 ENCOUNTER — Ambulatory Visit (INDEPENDENT_AMBULATORY_CARE_PROVIDER_SITE_OTHER): Payer: Medicare Other | Admitting: Physician Assistant

## 2023-04-23 ENCOUNTER — Encounter: Payer: Self-pay | Admitting: Physician Assistant

## 2023-04-23 ENCOUNTER — Ambulatory Visit: Payer: Medicare Other

## 2023-04-23 VITALS — BP 110/70 | HR 78 | Resp 18 | Ht <= 58 in | Wt 115.0 lb

## 2023-04-23 DIAGNOSIS — F0280 Dementia in other diseases classified elsewhere without behavioral disturbance: Secondary | ICD-10-CM | POA: Diagnosis not present

## 2023-04-23 DIAGNOSIS — G309 Alzheimer's disease, unspecified: Secondary | ICD-10-CM

## 2023-04-23 NOTE — Progress Notes (Signed)
Assessment/Plan:    The patient is seen in neurologic consultation at the request of Erasmo Downer, NP for the evaluation of memory.  Wanda [name] is a very pleasant 89 y.o. year old RH female with a history of hypertension, hyperlipidemia, seen today for evaluation of memory loss. MoCA today is 15/30. Patient is on donepezil 5 mg daily by PCP, although would not recommend at this time to increase it due to ongoing issues with diarrhea, until this were to be controlled . Patient has a recent hospitalization for a fall  hip fracture and UTI (02/2023), requiring Rehab NH . She is now out of Rehab. However, now at baseline, there is noticeable memory decline. Recent CT head personally reviewed is remarkable for chronic microvascular changes without acute findings. Global cerebral atrophy is noted. It is felt that she needs 24/7 monitoring but patient became very tearful, does not wish to go to ALF. She lives at home with 24/7 rotating caregivers. She wasn't to live alone, however this is not an option for her as she is at fall risk  and she needs assistance with ADLs.  Family to decide on other testing including MRI brain, Neuropsych, etc.  Dementia likely due to Alzheimer's Disease, late onset    Family to decide on MRI brain without contrast to assess for underlying structural abnormality and assess vascular load  Recommend good control of cardiovascular risk factors.  Continue donepezil 5 mg daily. Side effects were discussed   Continue to control mood as per PCP Folllow up in  6 months  Subjective:    The patient is accompanied by her son and her daughter in law  who supplements the history.    How long did patient have memory difficulties? A few years, worse over the last 6 months following a fall. . Patient has some difficulty remembering recent conversations and people's names, and instructions. "I asked her to put the meds and wallet in one place before leaving, and she did not  remember the instructions". She has also been having trouble naming objects.  She could not remember her room after she returned from her nursing home. "She does sundown  at times".I" think I had a 3rd child but I don't know what happened to him. (This is his son, who is alive) repeats oneself?  Endorsed Leaving objects in unusual places?   denies   Wandering behavior? denies   Any personality changes ? denies   Any history of depression?: denies   Hallucinations or paranoia? " She was worried about people taking money from her"-when she was in the hospital.  Seizures? denies    Any sleep changes?  Denies "although she did have vivid dreams when hospitalized for UTI" , Denies REM behavior or sleepwalking   Sleep apnea? denies   Any hygiene concerns?  denies   Independent of bathing and dressing?  She needs to be reminded, needs assistance Does the patient need help with medications? Son and 3 sitters rotating throughout the day is in charge   Who is in charge of the finances?  Son is in charge     Any changes in appetite? Forgets she ate and eats again     Patient have trouble swallowing?  denies   Does the patient cook?  Any kitchen accidents such as leaving the stove on? Patient denies   Any headaches?  denies   Chronic back pain?  denies   Ambulates with difficulty?  She uses a walker  for safety to prevent falls.    Recent falls or head injuries? denies     Vision changes? Unilateral weakness, numbness or tingling?  denies   Any tremors?  denies   Any anosmia?  denies   Any incontinence of urine? denies   Any bowel dysfunction? denies      Patient lives by herself with sitters      History of heavy alcohol intake? denies   History of heavy tobacco use? denies   Family history of dementia?  Does not know     Does patient drive? No   Retired [employer] operational officer  Allergies  Allergen Reactions   Sulfa Antibiotics     unknown   Naproxen Sodium Rash and Other (See Comments)    Passed out     Current Outpatient Medications  Medication Instructions   amLODipine (NORVASC) 5 mg, Oral, Daily   amoxicillin-clavulanate (AUGMENTIN) 875-125 MG tablet 1 tablet, Oral, 2 times daily   Calcium Carb-Cholecalciferol (CALCIUM 500/D PO) 1,000 mg, Oral, Daily   Cholecalciferol (VITAMIN D-3 PO) 1 tablet, Oral, Daily   cyanocobalamin (VITAMIN B12) 1,000 mcg, Intramuscular, Every 30 days   donepezil (ARICEPT) 5 mg, Oral, Daily at bedtime   FIBER PO 4 mg, Oral, Daily   Misc Natural Products (FIBER 7 PO) Fiber   nitrofurantoin (MACRODANTIN) 100 mg, 2 times daily     VITALS:   Vitals:   04/23/23 1310  BP: 110/70  Pulse: 78  Resp: 18  SpO2: 98%  Weight: 115 lb (52.2 kg)  Height: 4\' 10"  (1.473 m)       No data to display          PHYSICAL EXAM   HEENT:  Normocephalic, atraumatic. The mucous membranes are moist. The superficial temporal arteries are without ropiness or tenderness. Cardiovascular: Regular rate and rhythm. Lungs: Clear to auscultation bilaterally. Neck: There are no carotid bruits noted bilaterally.  NEUROLOGICAL:    04/23/2023    5:00 PM  Montreal Cognitive Assessment   Visuospatial/ Executive (0/5) 0  Naming (0/3) 2  Attention: Read list of digits (0/2) 2  Attention: Read list of letters (0/1) 1  Attention: Serial 7 subtraction starting at 100 (0/3) 1  Language: Repeat phrase (0/2) 1  Language : Fluency (0/1) 1  Abstraction (0/2) 2  Delayed Recall (0/5) 0  Orientation (0/6) 4  Total 14  Adjusted Score (based on education) 15        No data to display           Orientation:  Alert and oriented to person, not to place and time. No aphasia or dysarthria. Fund of knowledge is appropriate. Recent and remote memory impaired. Attention and concentration are reduced.  Able to name objects 2/3 and repeat phrases. Delayed recall  0/ 5 Cranial nerves: There is good facial symmetry. Extraocular muscles are intact and visual fields are full to confrontational  testing. Speech is fluent and clear. no tongue deviation. Hearing is intact to conversational tone.  Tone: Tone is good throughout. Sensation: Sensation is intact to light touch and pinprick throughout. Vibration is intact  Coordination: The patient has no difficulty with RAM's or FNF bilaterally. Normal finger to nose  Motor: Strength is 5/5 in the bilateral upper and lower extremities. There is no pronator drift. There are no fasciculations noted. DTR's: Deep tendon reflexes are 2/4 at the bilateral biceps, triceps, brachioradialis, patella and achilles.    Gait and Station: The patient is able to ambulate with some difficulty. Uses  the walker. Gait is cautious and narrow.     Thank you for allowing [place] the opportunity to participate in the care of this nice patient. Please do not hesitate to contact us for any questions or concerns.   Total time spent on today's visit was 60 minutes dedicated to this patient today, preparing to see patient, examining the patient, ordering tests and/or medications and counseling the patient, documenting clinical information in the EHR or other health record, independently interpreting results and communicating results to the patient/family, discussing treatment and goals, answering patient's questions and coordinating care.  Cc:  Erasmo Downer, NP  Wanda [name] 04/23/2023 5:44 PM

## 2023-04-23 NOTE — Patient Instructions (Signed)
It was a pleasure to see you today at our office.   Recommendation   Follow up in 6 months Recommend 24/7 monitoring Continue donepezil 5 mg daily  For guidance regarding [facility] and if placement were needed at the facility, contact Sidney Ace, Social Worker tel: [phone]  For assessment of decision of mental capacity and competency:  Call Dr. Erick Blinks, geriatric psychiatrist at [phone] Counseling regarding caregiver distress, including caregiver depression, anxiety and issues regarding community resources, adult day care programs, adult living facilities, or memory care questions:  please contact your  Primary Doctor's Social Worker  Whom to call: Memory  decline, memory medications: Call our office [phone]  For psychiatric meds, mood meds: Please have your primary care physician manage these medications.  If you have any severe symptoms of a stroke, or other severe issues such as confusion,severe chills or fever, etc call 911 or go to the ER as you may need to be evaluated further    RECOMMENDATIONS FOR ALL PATIENTS WITH MEMORY PROBLEMS: 1. Continue to exercise (Recommend 30 minutes of walking everyday, or 3 hours every week) 2. Increase social interactions - continue going to [place] and enjoy social gatherings with friends and family 3. Eat healthy, avoid fried foods and eat more fruits and vegetables 4. Maintain adequate blood pressure, blood sugar, and blood cholesterol level. Reducing the risk of stroke and cardiovascular disease also helps promoting better memory. 5. Avoid stressful situations. Live a simple life and avoid aggravations. Organize your time and prepare for the next day in anticipation. 6. Sleep well, avoid any interruptions of sleep and avoid any distractions in the bedroom that may interfere with adequate sleep quality 7. Avoid sugar, avoid sweets as there is a strong link between excessive sugar intake, diabetes, and  cognitive impairment We discussed the Mediterranean diet, which has been shown to help patients reduce the risk of progressive memory disorders and reduces cardiovascular risk. This includes eating fish, eat fruits and green leafy vegetables, nuts like almonds and hazelnuts, walnuts, and also use olive oil. Avoid fast foods and fried foods as much as possible. Avoid sweets and sugar as sugar use has been linked to worsening of memory function.  There is always a concern of gradual progression of memory problems. If this is the case, then we may need to adjust level of care according to patient needs. Support, both to the patient and caregiver, should then be put into place.      You have been referred for a neuropsychological evaluation (i.e., evaluation of memory and thinking abilities). Please bring someone with you to this appointment if possible, as it is helpful for the doctor to hear from both you and another adult who knows you well. Please bring eyeglasses and hearing aids if you wear them.    The evaluation will take approximately 3 hours and has two parts:   The first part is a clinical interview with the neuropsychologist (Dr. Milbert Coulter or Dr. Roseanne Reno). During the interview, the neuropsychologist will speak with you and the individual you brought to the appointment.    The second part of the evaluation is testing with the doctor's technician Annabelle Harman or Selena Batten). During the testing, the technician will ask you to remember different types of material, solve problems, and answer some questionnaires. Your family member will not be present for this portion of the evaluation.   Please note: We must reserve several hours of the neuropsychologist's time and the psychometrician's time for your  evaluation appointment. As such, there is a No-Show fee of $100. If you are unable to attend any of your appointments, please contact our office as soon as possible to reschedule.    FALL PRECAUTIONS: Be cautious  when walking. Scan the area for obstacles that may increase the risk of trips and falls. When getting up in the mornings, sit up at the edge of the bed for a few minutes before getting out of bed. Consider elevating the bed at the head end to avoid drop of blood pressure when getting up. Walk always in a well-lit room (use night lights in the walls). Avoid area rugs or power cords from appliances in the middle of the walkways. Use a walker or a cane if necessary and consider physical therapy for balance exercise. Get your eyesight checked regularly.  FINANCIAL OVERSIGHT: Supervision, especially oversight when making financial decisions or transactions is also recommended.  HOME SAFETY: Consider the safety of the kitchen when operating appliances like stoves, microwave oven, and blender. Consider having supervision and share cooking responsibilities until no longer able to participate in those. Accidents with firearms and other hazards in the house should be identified and addressed as well.   ABILITY TO BE LEFT ALONE: If patient is unable to contact 911 operator, consider using [facility], or when the need is there, arrange for someone to stay with patients. Smoking is a fire hazard, consider supervision or cessation. Risk of wandering should be assessed by caregiver and if detected at any point, supervision and safe proof recommendations should be instituted.  MEDICATION SUPERVISION: Inability to self-administer medication needs to be constantly addressed. Implement a mechanism to ensure safe administration of the medications.   DRIVING: Regarding driving, in patients with progressive memory problems, driving will be impaired. We advise to have someone else do the driving if trouble finding directions or if minor accidents are reported. Independent driving assessment is available to determine safety of driving.   If you are interested in the driving assessment, you can contact the following:  [facility]  [employer] in [place] [phone]  [facility] [phone]  [facility] [phone] or [phone]    Mediterranean Diet A Mediterranean diet refers to food and lifestyle choices that are based on the traditions of countries located on the [employer] [place]. This way of eating has been shown to help prevent certain conditions and improve outcomes for people who have chronic diseases, like kidney disease and heart disease. What are tips for following this plan? Lifestyle  Cook and eat meals together with your family, when possible. Drink enough fluid to keep your urine clear or pale yellow. Be physically active every day. This includes: Aerobic exercise like running or swimming. Leisure activities like gardening, walking, or housework. Get 7-8 hours of sleep each night. If recommended by your health care provider, drink red wine in moderation. This means 1 glass a day for nonpregnant women and 2 glasses a day for men. A glass of wine equals 5 oz (150 mL). Reading food labels  Check the serving size of packaged foods. For foods such as rice and pasta, the serving size refers to the amount of cooked product, not dry. Check the total fat in packaged foods. Avoid foods that have saturated fat or trans fats. Check the ingredients list for added sugars, such as corn syrup. Shopping  At the grocery store, buy most of your food from the areas near the walls of the store. This includes:  Fresh fruits and vegetables (produce). Grains, beans, nuts, and seeds. Some of these may be available in unpackaged forms or large amounts (in bulk). Fresh seafood. Poultry and eggs. Low-fat dairy products. Buy whole ingredients instead of prepackaged foods. Buy fresh fruits and vegetables in-season from local farmers markets. Buy frozen fruits and vegetables in resealable bags. If you do not have access to quality fresh seafood, buy  precooked frozen shrimp or canned fish, such as tuna, salmon, or sardines. Buy small amounts of raw or cooked vegetables, salads, or olives from the deli or salad bar at your store. Stock your pantry so you always have certain foods on hand, such as olive oil, canned tuna, canned tomatoes, rice, pasta, and beans. Cooking  Cook foods with extra-virgin olive oil instead of using butter or other vegetable oils. Have meat as a side dish, and have vegetables or grains as your main dish. This means having meat in small portions or adding small amounts of meat to foods like pasta or stew. Use beans or vegetables instead of meat in common dishes like chili or lasagna. Experiment with different cooking methods. Try roasting or broiling vegetables instead of steaming or sauteing them. Add frozen vegetables to soups, stews, pasta, or rice. Add nuts or seeds for added healthy fat at each meal. You can add these to yogurt, salads, or vegetable dishes. Marinate fish or vegetables using olive oil, lemon juice, garlic, and fresh herbs. Meal planning  Plan to eat 1 vegetarian meal one day each week. Try to work up to 2 vegetarian meals, if possible. Eat seafood 2 or more times a week. Have healthy snacks readily available, such as: Vegetable sticks with hummus. Greek yogurt. Fruit and nut trail mix. Eat balanced meals throughout the week. This includes: Fruit: 2-3 servings a day Vegetables: 4-5 servings a day Low-fat dairy: 2 servings a day Fish, poultry, or lean meat: 1 serving a day Beans and legumes: 2 or more servings a week Nuts and seeds: 1-2 servings a day Whole grains: 6-8 servings a day Extra-virgin olive oil: 3-4 servings a day Limit red meat and sweets to only a few servings a month What are my food choices? Mediterranean diet Recommended Grains: Whole-grain pasta. Brown rice. Bulgar wheat. Polenta. Couscous. Whole-wheat bread. Orpah Cobb. Vegetables: Artichokes. Beets. Broccoli.  Cabbage. Carrots. Eggplant. Green beans. Chard. Kale. Spinach. Onions. Leeks. Peas. Squash. Tomatoes. Peppers. Radishes. Fruits: Apples. Apricots. Avocado. Berries. Bananas. Cherries. Dates. Figs. Grapes. Lemons. Melon. Oranges. Peaches. Plums. Pomegranate. Meats and other protein foods: Beans. Almonds. Sunflower seeds. Pine nuts. Peanuts. Cod. Salmon. Scallops. Shrimp. Tuna. Tilapia. Clams. Oysters. Eggs. Dairy: Low-fat milk. Cheese. Greek yogurt. Beverages: Water. Red wine. Herbal tea. Fats and oils: Extra virgin olive oil. Avocado oil. Grape seed oil. Sweets and desserts: [place] yogurt with honey. Baked apples. Poached pears. Trail mix. Seasoning and other foods: Basil. Cilantro. Coriander. Cumin. Mint. Parsley. Sage. Rosemary. Tarragon. Garlic. Oregano. Thyme. Pepper. Balsalmic vinegar. Tahini. Hummus. Tomato sauce. Olives. Mushrooms. Limit these Grains: Prepackaged pasta or rice dishes. Prepackaged cereal with added sugar. Vegetables: Deep fried potatoes (french fries). Fruits: Fruit canned in syrup. Meats and other protein foods: Beef. Pork. Lamb. Poultry with skin. Hot dogs. Tomasa Blase. Dairy: Ice cream. Sour cream. Whole milk. Beverages: Juice. Sugar-sweetened soft drinks. Beer. Liquor and spirits. Fats and oils: Butter. Canola oil. Vegetable oil. Beef fat (tallow). Lard. Sweets and desserts: Cookies. Cakes. Pies. Candy. Seasoning and other foods: Mayonnaise. Premade sauces and marinades. The items listed may not be a complete  list. Talk with your dietitian about what dietary choices are right for you. Summary The Mediterranean diet includes both food and lifestyle choices. Eat a variety of fresh fruits and vegetables, beans, nuts, seeds, and whole grains. Limit the amount of red meat and sweets that you eat. Talk with your health care provider about whether it is safe for you to drink red wine in moderation. This means 1 glass a day for nonpregnant women and 2 glasses a day for men. A glass  of wine equals 5 oz (150 mL). This information is not intended to replace advice given to you by your health care provider. Make sure you discuss any questions you have with your health care provider. Document Released: 06/25/2016 Document Revised: 07/28/2016 Document Reviewed: 06/25/2016 Elsevier Interactive Patient Education  2017 [employer].

## 2023-04-26 ENCOUNTER — Encounter: Payer: Self-pay | Admitting: Physician Assistant

## 2023-04-27 ENCOUNTER — Encounter (HOSPITAL_COMMUNITY): Payer: Self-pay | Admitting: Hematology

## 2023-04-30 ENCOUNTER — Ambulatory Visit (INDEPENDENT_AMBULATORY_CARE_PROVIDER_SITE_OTHER): Payer: Medicare Other | Admitting: Orthopedic Surgery

## 2023-04-30 ENCOUNTER — Encounter: Payer: Self-pay | Admitting: Orthopedic Surgery

## 2023-04-30 ENCOUNTER — Other Ambulatory Visit (INDEPENDENT_AMBULATORY_CARE_PROVIDER_SITE_OTHER): Payer: Medicare Other

## 2023-04-30 DIAGNOSIS — Z9889 Other specified postprocedural states: Secondary | ICD-10-CM | POA: Diagnosis not present

## 2023-04-30 DIAGNOSIS — S72142D Displaced intertrochanteric fracture of left femur, subsequent encounter for closed fracture with routine healing: Secondary | ICD-10-CM

## 2023-04-30 NOTE — Progress Notes (Signed)
Orthopaedic Postop Note  Assessment: Wanda [name] is a 89 y.o. female s/p cephalomedullary nail for Left intertrochanteric femur fracture  DOS: 02/18/2023  Plan: Wanda [name] is doing very well.  She has no pain.  Her motion and strength is improving.  She is ambulating with a walker, but doing well.  No concerns at this time.  Radiographs are stable.  Urged her to continue walking in order to improve her strength, and continue to heal.  She states her understanding.  I would like to see her back in approximately 3 months for repeat evaluation.  Follow-up: Return in about 3 months (around 07/31/2023). XR at next visit: AP pelvis and Left femur  Subjective:  Chief Complaint  Patient presents with   Routine Post Op    L hip post op DOS 02/18/23    History of Present Illness: Wanda [name] is a 89 y.o. female who presents following the above stated procedure.  He was approximately 10 weeks ago.  She is doing well.  She is at home, and working with home health.  She is ambulating well with the assistance of a walker.  She has no pain.  She is not taking medicines.  No issues with her incisions.   Review of Systems: No fevers or chills No numbness or tingling No Chest Pain No shortness of breath   Objective: There were no vitals taken for this visit.  Physical Exam:  Alert and oriented.  No acute distress.  Ambulates well using a walker. Surgical incisions have healed.  No surrounding erythema or drainage.  No tenderness to palpation along the lateral hip.  She is able to maintain a straight leg raise, and no pain with axial loading.  No pain with gentle range of motion of the left hip.  She is good strength throughout the left lower extremity.  Sensation is intact distally.   IMAGING: I personally ordered and reviewed the following images:  X-rays of the left femur, as well as an AP pelvis were obtained in clinic today.  These compared to prior x-rays.  Fracture remains  in stable position.  There is interval consolidation of the fracture site.  No evidence of hardware failure.  No screw cut out.  Overall positioning of the cephalomedullary nail remains unchanged.  Impression: Healed left intertrochanteric femur fracture without hardware failure   Oliver Barre, MD 04/30/2023 10:45 AM

## 2023-06-18 ENCOUNTER — Telehealth: Payer: Self-pay | Admitting: Orthopedic Surgery

## 2023-06-18 NOTE — Telephone Encounter (Signed)
DR. Dallas Schimke   Patient son called and states his mother is getting her teeth cleaned and they asked if she was on antibiotics to get her teeth cleaned.  Please call Aneta Mins back at [phone] and advise him

## 2023-06-18 NOTE — Telephone Encounter (Signed)
Called and spoke with pt son who states pt was getting her teeth cleaned at that time. Son states they let him know that dentist made comment antibiotics are usually not needed in pt case, but he wanted to call and make sure. Let pt son know Dr. Dallas Schimke' is out of the office but if there's any information that needs to be passed on we will definitely give them a call when he responds.

## 2023-07-30 ENCOUNTER — Ambulatory Visit: Payer: Medicare Other | Admitting: Orthopedic Surgery

## 2023-09-13 ENCOUNTER — Telehealth: Payer: Self-pay | Admitting: Physician Assistant

## 2023-09-13 NOTE — Telephone Encounter (Signed)
Patient son wants a call back, he is wanting to speak to someone about starting medication for his mother. She has a upcoming appt in Dec with Huntley Dec.

## 2023-09-13 NOTE — Telephone Encounter (Signed)
More anxiety and memory is getting worse, wanted to see if you could send anything for anxiety. I advised I would call back tomorrow once she was in clinic, currently out of the office today

## 2023-09-14 NOTE — Telephone Encounter (Signed)
Yes, would like Rx for Lexapro, please send in thank you. Pharmacy is correct

## 2023-09-15 ENCOUNTER — Encounter: Payer: Self-pay | Admitting: Physician Assistant

## 2023-09-15 ENCOUNTER — Other Ambulatory Visit: Payer: Self-pay | Admitting: Physician Assistant

## 2023-09-15 MED ORDER — ESCITALOPRAM OXALATE 5 MG PO TABS: 5.0000 mg | ORAL_TABLET | Freq: Every day | ORAL | 3 refills | Status: DC

## 2023-10-25 ENCOUNTER — Encounter: Payer: Self-pay | Admitting: Physician Assistant

## 2023-10-26 ENCOUNTER — Ambulatory Visit (INDEPENDENT_AMBULATORY_CARE_PROVIDER_SITE_OTHER): Payer: Medicare Other | Admitting: Physician Assistant

## 2023-10-26 ENCOUNTER — Encounter: Payer: Self-pay | Admitting: Physician Assistant

## 2023-10-26 VITALS — BP 138/80 | HR 70 | Resp 18 | Ht <= 58 in | Wt 108.0 lb

## 2023-10-26 DIAGNOSIS — G309 Alzheimer's disease, unspecified: Secondary | ICD-10-CM

## 2023-10-26 DIAGNOSIS — F0280 Dementia in other diseases classified elsewhere without behavioral disturbance: Secondary | ICD-10-CM | POA: Diagnosis not present

## 2023-10-26 NOTE — Progress Notes (Signed)
Assessment/Plan:   Dementia likely due to Alzheimer's disease, late onset with behavioral disturbance (anxiety)   Wanda [name] is a delightful 89 y.o. RH female with a history of hypertension, hyperlipidemia and a history of dementia likely due to Alzheimer's disease seen today in follow up for memory loss. Patient is currently on memantine 7 mg XR per PCP, tolerating well..  She needs assistance with her ADLs 24/7 monitoring with HHN. Overall her memory is stable.    follow up in 6  months. Continue memantine XR 7 mg daily. Side effects discussed  Recommend good control of her cardiovascular risk factors Continue 24/7 monitoring with HHN rotating caregivers Continue to control mood with Lexapro 5 mg daily      Subjective:    This patient is accompanied in the office by her son  who supplements the history.  Previous records as well as any outside records available were reviewed prior to todays visit. Patient was last seen on 04/26/2023 with a MoCA of 15/30    Any changes in memory since last visit? "It is better"-son mentions it may be not better  She has some difficulty remembering recent conversations and names and instructions.  She also has some trouble naming objects.  She may have trouble remembering which room to go.  Sometimes she thinks that she has a third child repeats oneself?  Endorsed Disoriented when walking into a room?  Patient denies  Leaving objects?  May misplace things but not in unusual places   Wandering behavior?  denies   Any personality changes since last visit?  denies   Any worsening depression?:  Denies.   Hallucinations or paranoia?  No hallucinations, but she is worried about people taking money from her or people coming into the house .  Seizures? denies    Any sleep changes? Sleeps well.   Denies vivid dreams, REM behavior or sleepwalking   Sleep apnea?   Denies.   Any hygiene concerns?  As before, she needs to be reminded to shower.    Independent of bathing and dressing?  She needs assistance to get dressed to shower. Does the patient needs help with medications?  Family and sitters are in charge  Who is in charge of the finances?  Son is in charge    Any changes in appetite?  Maintaining her weight, has meals on wheels She forgets that she eats and she eats again    Patient have trouble swallowing? Denies.   Does the patient cook? No  Any headaches?   denies .  Chronic back pain  denies .  Ambulates with difficulty?  Uses a walker to prevent falls.  Recent falls or head injuries? Denies.     Unilateral weakness, numbness or tingling? Denies.   Any tremors?  Denies   Any anosmia?  Denies   Any incontinence of urine?  Endorsed , wears diapers if she has to go out  Any bowel dysfunction?   Denies      Patient lives alone, with 24-hour care, rotating HHN  Does the patient drive? No longer drives      Initial visit 04/26/2023 How long did patient have memory difficulties? A few years, worse over the last 6 months following a fall. . Patient has some difficulty remembering recent conversations and people's names, and instructions. "I asked her to put the meds and wallet in one place before leaving, and she did not remember the instructions". She has also been having trouble naming objects.  She could not remember her room after she returned from her nursing home. "She does sundown  at times".I" think I had a 3rd child but I don't know what happened to him. (This is his son, who is alive) repeats oneself?  Endorsed Leaving objects in unusual places?   denies   Wandering behavior? denies   Any personality changes ? denies   Any history of depression?: denies   Hallucinations or paranoia? " She was worried about people taking money from her"-when she was in the hospital.  Seizures? denies    Any sleep changes?  Denies "although she did have vivid dreams when hospitalized for UTI" , Denies REM behavior or sleepwalking   Sleep  apnea? denies   Any hygiene concerns?  denies   Independent of bathing and dressing?  She needs to be reminded, needs assistance Does the patient need help with medications? Son and 3 sitters rotating throughout the day is in charge   Who is in charge of the finances?  Son is in charge     Any changes in appetite? Forgets she ate and eats again     Patient have trouble swallowing?  denies   Does the patient cook?  Any kitchen accidents such as leaving the stove on? Patient denies   Any headaches?  denies   Chronic back pain?  denies   Ambulates with difficulty?  She uses a walker for safety to prevent falls.    Recent falls or head injuries? denies     Vision changes? Unilateral weakness, numbness or tingling?  denies   Any tremors?  denies   Any anosmia?  denies   Any incontinence of urine? denies   Any bowel dysfunction? denies      Patient lives by herself with sitters      History of heavy alcohol intake? denies   History of heavy tobacco use? denies   Family history of dementia?  Does not know     Does patient drive? No   Retired [employer] operational officer  PREVIOUS MEDICATIONS: donepezil, unable to tolerate .   CURRENT MEDICATIONS:  Outpatient Encounter Medications as of 10/26/2023  Medication Sig   amLODipine (NORVASC) 5 MG tablet Take 1 tablet (5 mg total) by mouth daily.   Calcium Carb-Cholecalciferol (CALCIUM 500/D PO) Take 1,000 mg by mouth daily.   Cholecalciferol (VITAMIN D-3 PO) Take 1 tablet by mouth daily.   cyanocobalamin (,VITAMIN B-12,) 1000 MCG/ML injection Inject 1,000 mcg into the muscle every 30 (thirty) days.   donepezil (ARICEPT) 5 MG tablet Take 5 mg by mouth at bedtime.   escitalopram (LEXAPRO) 5 MG tablet Take 1 tablet (5 mg total) by mouth daily.   FIBER PO Take 4 mg by mouth daily.   hydrochlorothiazide (HYDRODIURIL) 12.5 MG tablet Take 12.5 mg by mouth every morning.   memantine (NAMENDA XR) 7 MG CP24 24 hr capsule Take 7 mg by mouth daily.   Misc Natural Products  (FIBER 7 PO) Fiber   amoxicillin-clavulanate (AUGMENTIN) 875-125 MG tablet Take 1 tablet by mouth 2 (two) times daily. (Patient not taking: Reported on 10/26/2023)   nitrofurantoin (MACRODANTIN) 100 MG capsule Take 100 mg by mouth 2 (two) times daily. (Patient not taking: Reported on 10/26/2023)   No facility-administered encounter medications on file as of 10/26/2023.        No data to display            04/23/2023    5:00 PM  [facility] Cognitive Assessment   Visuospatial/ Executive (  0/5) 0  Naming (0/3) 2  Attention: Read list of digits (0/2) 2  Attention: Read list of letters (0/1) 1  Attention: Serial 7 subtraction starting at 100 (0/3) 1  Language: Repeat phrase (0/2) 1  Language : Fluency (0/1) 1  Abstraction (0/2) 2  Delayed Recall (0/5) 0  Orientation (0/6) 4  Total 14  Adjusted Score (based on education) 15    Objective:     PHYSICAL EXAMINATION:    VITALS:   Vitals:   10/26/23 1247  BP: 138/80  Pulse: 70  Resp: 18  SpO2: 98%  Weight: 108 lb (49 kg)  Height: 4\' 10"  (1.473 m)    GEN:  The patient appears stated age and is in NAD. HEENT:  Normocephalic, atraumatic.   Neurological examination:  General: NAD, well-groomed, appears stated age. Orientation: The patient is alert. Oriented to person, not to place and date Cranial nerves: There is good facial symmetry.The speech is fluent and clear. No aphasia or dysarthria. Fund of knowledge is reduced. Recent and remote memory are impaired. Attention and concentration are reduced.  Able to name objects and repeat phrases.  Hearing is intact to conversational tone.  Sensation: Sensation is intact to light touch throughout Motor: Strength is at least antigravity x4. DTR's 2/4 in UE/LE     Movement examination: Tone: There is normal tone in the UE/LE Abnormal movements:  no tremor.  No myoclonus.  No asterixis.   Coordination:  There is no decremation with RAM's. Normal finger to nose  Gait and Station: The  patient has some difficulty arising out of a deep-seated chair without the use of the hands.  Needs a walker to ambulate.  The patient's stride length is good.  Gait is cautious and narrow.    Thank you for allowing [place] the opportunity to participate in the care of this nice patient. Please do not hesitate to contact us for any questions or concerns.   Total time spent on today's visit was 34 minutes dedicated to this patient today, preparing to see patient, examining the patient, ordering tests and/or medications and counseling the patient, documenting clinical information in the EHR or other health record, independently interpreting results and communicating results to the patient/family, discussing treatment and goals, answering patient's questions and coordinating care.  Cc:  Erasmo Downer, NP  Marlowe Kays 10/26/2023 5:35 PM

## 2023-10-26 NOTE — Patient Instructions (Signed)
It was a pleasure to see you today at our office.   Recommendation   Follow up in 6 months Recommend 24/7 monitoring Continue  memantine XR 7 mg nightly  Continue Lexapro for mood   For guidance regarding [facility] and if placement were needed at the facility, contact Sidney Ace, Social Worker tel: [phone]  For assessment of decision of mental capacity and competency:  Call Dr. Erick Blinks, geriatric psychiatrist at [phone] Counseling regarding caregiver distress, including caregiver depression, anxiety and issues regarding community resources, adult day care programs, adult living facilities, or memory care questions:  please contact your  Primary Doctor's Social Worker  Whom to call: Memory  decline, memory medications: Call our office [phone]  For psychiatric meds, mood meds: Please have your primary care physician manage these medications.  If you have any severe symptoms of a stroke, or other severe issues such as confusion,severe chills or fever, etc call 911 or go to the ER as you may need to be evaluated further    RECOMMENDATIONS FOR ALL PATIENTS WITH MEMORY PROBLEMS: 1. Continue to exercise (Recommend 30 minutes of walking everyday, or 3 hours every week) 2. Increase social interactions - continue going to [place] and enjoy social gatherings with friends and family 3. Eat healthy, avoid fried foods and eat more fruits and vegetables 4. Maintain adequate blood pressure, blood sugar, and blood cholesterol level. Reducing the risk of stroke and cardiovascular disease also helps promoting better memory. 5. Avoid stressful situations. Live a simple life and avoid aggravations. Organize your time and prepare for the next day in anticipation. 6. Sleep well, avoid any interruptions of sleep and avoid any distractions in the bedroom that may interfere with adequate sleep quality 7. Avoid sugar, avoid sweets as there is a strong link between  excessive sugar intake, diabetes, and cognitive impairment We discussed the Mediterranean diet, which has been shown to help patients reduce the risk of progressive memory disorders and reduces cardiovascular risk. This includes eating fish, eat fruits and green leafy vegetables, nuts like almonds and hazelnuts, walnuts, and also use olive oil. Avoid fast foods and fried foods as much as possible. Avoid sweets and sugar as sugar use has been linked to worsening of memory function.  There is always a concern of gradual progression of memory problems. If this is the case, then we may need to adjust level of care according to patient needs. Support, both to the patient and caregiver, should then be put into place.       FALL PRECAUTIONS: Be cautious when walking. Scan the area for obstacles that may increase the risk of trips and falls. When getting up in the mornings, sit up at the edge of the bed for a few minutes before getting out of bed. Consider elevating the bed at the head end to avoid drop of blood pressure when getting up. Walk always in a well-lit room (use night lights in the walls). Avoid area rugs or power cords from appliances in the middle of the walkways. Use a walker or a cane if necessary and consider physical therapy for balance exercise. Get your eyesight checked regularly.  FINANCIAL OVERSIGHT: Supervision, especially oversight when making financial decisions or transactions is also recommended.  HOME SAFETY: Consider the safety of the kitchen when operating appliances like stoves, microwave oven, and blender. Consider having supervision and share cooking responsibilities until no longer able to participate in those. Accidents with firearms and other hazards in the house  should be identified and addressed as well.   ABILITY TO BE LEFT ALONE: If patient is unable to contact 911 operator, consider using LifeLine, or when the need is there, arrange for someone to stay with patients.  Smoking is a fire hazard, consider supervision or cessation. Risk of wandering should be assessed by caregiver and if detected at any point, supervision and safe proof recommendations should be instituted.  MEDICATION SUPERVISION: Inability to self-administer medication needs to be constantly addressed. Implement a mechanism to ensure safe administration of the medications.   DRIVING: Regarding driving, in patients with progressive memory problems, driving will be impaired. We advise to have someone else do the driving if trouble finding directions or if minor accidents are reported. Independent driving assessment is available to determine safety of driving.   If you are interested in the driving assessment, you can contact the following:  [facility] [employer] [facility] in [place] [phone]  [facility] [phone]  [facility] [phone] or [phone]    Mediterranean Diet A Mediterranean diet refers to food and lifestyle choices that are based on the traditions of countries located on the [place]. This way of eating has been shown to help prevent certain conditions and improve outcomes for people who have chronic diseases, like kidney disease and heart disease. What are tips for following this plan? Lifestyle  Cook and eat meals together with your family, when possible. Drink enough fluid to keep your urine clear or pale yellow. Be physically active every day. This includes: Aerobic exercise like running or swimming. Leisure activities like gardening, walking, or housework. Get 7-8 hours of sleep each night. If recommended by your health care provider, drink red wine in moderation. This means 1 glass a day for nonpregnant women and 2 glasses a day for men. A glass of wine equals 5 oz (150 mL). Reading food labels  Check the serving size of packaged foods. For foods such as rice and pasta, the serving size  refers to the amount of cooked product, not dry. Check the total fat in packaged foods. Avoid foods that have saturated fat or trans fats. Check the ingredients list for added sugars, such as corn syrup. Shopping  At the grocery store, buy most of your food from the areas near the walls of the store. This includes: Fresh fruits and vegetables (produce). Grains, beans, nuts, and seeds. Some of these may be available in unpackaged forms or large amounts (in bulk). Fresh seafood. Poultry and eggs. Low-fat dairy products. Buy whole ingredients instead of prepackaged foods. Buy fresh fruits and vegetables in-season from local farmers markets. Buy frozen fruits and vegetables in resealable bags. If you do not have access to quality fresh seafood, buy precooked frozen shrimp or canned fish, such as tuna, salmon, or sardines. Buy small amounts of raw or cooked vegetables, salads, or olives from the deli or salad bar at your store. Stock your pantry so you always have certain foods on hand, such as olive oil, canned tuna, canned tomatoes, rice, pasta, and beans. Cooking  Cook foods with extra-virgin olive oil instead of using butter or other vegetable oils. Have meat as a side dish, and have vegetables or grains as your main dish. This means having meat in small portions or adding small amounts of meat to foods like pasta or stew. Use beans or vegetables instead of meat in common dishes like chili or lasagna. Experiment with different cooking methods. Try roasting or broiling vegetables  instead of steaming or sauteing them. Add frozen vegetables to soups, stews, pasta, or rice. Add nuts or seeds for added healthy fat at each meal. You can add these to yogurt, salads, or vegetable dishes. Marinate fish or vegetables using olive oil, lemon juice, garlic, and fresh herbs. Meal planning  Plan to eat 1 vegetarian meal one day each week. Try to work up to 2 vegetarian meals, if possible. Eat seafood 2 or  more times a week. Have healthy snacks readily available, such as: Vegetable sticks with hummus. Greek yogurt. Fruit and nut trail mix. Eat balanced meals throughout the week. This includes: Fruit: 2-3 servings a day Vegetables: 4-5 servings a day Low-fat dairy: 2 servings a day Fish, poultry, or lean meat: 1 serving a day Beans and legumes: 2 or more servings a week Nuts and seeds: 1-2 servings a day Whole grains: 6-8 servings a day Extra-virgin olive oil: 3-4 servings a day Limit red meat and sweets to only a few servings a month What are my food choices? Mediterranean diet Recommended Grains: Whole-grain pasta. Brown rice. Bulgar wheat. Polenta. Couscous. Whole-wheat bread. Orpah Cobb. Vegetables: Artichokes. Beets. Broccoli. Cabbage. Carrots. Eggplant. Green beans. Chard. Kale. Spinach. Onions. Leeks. Peas. Squash. Tomatoes. Peppers. Radishes. Fruits: Apples. Apricots. Avocado. Berries. Bananas. Cherries. Dates. Figs. Grapes. Lemons. Melon. Oranges. Peaches. Plums. Pomegranate. Meats and other protein foods: Beans. Almonds. Sunflower seeds. Pine nuts. Peanuts. Cod. Salmon. Scallops. Shrimp. Tuna. Tilapia. Clams. Oysters. Eggs. Dairy: Low-fat milk. Cheese. Greek yogurt. Beverages: Water. Red wine. Herbal tea. Fats and oils: Extra virgin olive oil. Avocado oil. Grape seed oil. Sweets and desserts: [place] yogurt with honey. Baked apples. Poached pears. Trail mix. Seasoning and other foods: Basil. Cilantro. Coriander. Cumin. Mint. Parsley. Sage. Rosemary. Tarragon. Garlic. Oregano. Thyme. Pepper. Balsalmic vinegar. Tahini. Hummus. Tomato sauce. Olives. Mushrooms. Limit these Grains: Prepackaged pasta or rice dishes. Prepackaged cereal with added sugar. Vegetables: Deep fried potatoes (french fries). Fruits: Fruit canned in syrup. Meats and other protein foods: Beef. Pork. Lamb. Poultry with skin. Hot dogs. Tomasa Blase. Dairy: Ice cream. Sour cream. Whole milk. Beverages: Juice.  Sugar-sweetened soft drinks. Beer. Liquor and spirits. Fats and oils: Butter. Canola oil. Vegetable oil. Beef fat (tallow). Lard. Sweets and desserts: Cookies. Cakes. Pies. Candy. Seasoning and other foods: Mayonnaise. Premade sauces and marinades. The items listed may not be a complete list. Talk with your dietitian about what dietary choices are right for you. Summary The Mediterranean diet includes both food and lifestyle choices. Eat a variety of fresh fruits and vegetables, beans, nuts, seeds, and whole grains. Limit the amount of red meat and sweets that you eat. Talk with your health care provider about whether it is safe for you to drink red wine in moderation. This means 1 glass a day for nonpregnant women and 2 glasses a day for men. A glass of wine equals 5 oz (150 mL). This information is not intended to replace advice given to you by your health care provider. Make sure you discuss any questions you have with your health care provider. Document Released: 06/25/2016 Document Revised: 07/28/2016 Document Reviewed: 06/25/2016 Elsevier Interactive Patient Education  2017 [employer].

## 2023-12-30 ENCOUNTER — Other Ambulatory Visit: Payer: Self-pay | Admitting: Physician Assistant

## 2024-01-15 ENCOUNTER — Emergency Department (HOSPITAL_COMMUNITY)

## 2024-01-15 ENCOUNTER — Encounter (HOSPITAL_COMMUNITY): Payer: Self-pay | Admitting: *Deleted

## 2024-01-15 ENCOUNTER — Emergency Department (HOSPITAL_COMMUNITY)
Admission: EM | Admit: 2024-01-15 | Discharge: 2024-01-15 | Disposition: A | Discharge status: Home / Self Care | Attending: Emergency Medicine | Admitting: Emergency Medicine

## 2024-01-15 ENCOUNTER — Encounter (HOSPITAL_COMMUNITY): Payer: Self-pay | Admitting: Hematology

## 2024-01-15 ENCOUNTER — Other Ambulatory Visit: Payer: Self-pay

## 2024-01-15 DIAGNOSIS — W19XXXA Unspecified fall, initial encounter: Secondary | ICD-10-CM

## 2024-01-15 DIAGNOSIS — S0990XA Unspecified injury of head, initial encounter: Secondary | ICD-10-CM | POA: Diagnosis present

## 2024-01-15 DIAGNOSIS — W01198A Fall on same level from slipping, tripping and stumbling with subsequent striking against other object, initial encounter: Secondary | ICD-10-CM | POA: Insufficient documentation

## 2024-01-15 DIAGNOSIS — Z79899 Other long term (current) drug therapy: Secondary | ICD-10-CM | POA: Diagnosis not present

## 2024-01-15 HISTORY — DX: Unspecified dementia, unspecified severity, without behavioral disturbance, psychotic disturbance, mood disturbance, and anxiety: F03.90

## 2024-01-15 NOTE — ED Notes (Signed)
 Pt ambulated out of room assisted by walker smiling and laughing. Pt and family verbalized understanding of discharge instructions.

## 2024-01-15 NOTE — ED Triage Notes (Addendum)
 Pt fell and hit back of head on hard floor this morning. Pt has dementia. Denies LOC.  Denies blood thinners. States she has a knot and tender with touch per pt. Pt ambulating without difficultly with her walker.  Grandson with pt and denies any changes in pt's baseline.

## 2024-01-15 NOTE — Discharge Instructions (Signed)
Take Tylenol for pain and follow-up with your doctor if any problem ?

## 2024-01-15 NOTE — ED Provider Notes (Signed)
 [place] [facility]MENT AT [facility] Provider Note   CSN: [id number] Arrival date & time: 01/15/24  1118     History {Add pertinent medical, surgical, social history, OB history to HPI:1} Chief Complaint  Patient presents with   Wanda [name] is a 89 y.o. female.  Patient has a history of dementia.  She fell today and hit the back of her head.  No loss of consciousness   Fall       Home Medications Prior to Admission medications   Medication Sig Start Date End Date Taking? Authorizing Provider  amLODipine (NORVASC) 5 MG tablet Take 1 tablet (5 mg total) by mouth daily. 04/15/23   Jacalyn Lefevre, MD  amoxicillin-clavulanate (AUGMENTIN) 875-125 MG tablet Take 1 tablet by mouth 2 (two) times daily. Patient not taking: Reported on 10/26/2023 04/13/23   [provider]  Calcium Carb-Cholecalciferol (CALCIUM 500/D PO) Take 1,000 mg by mouth daily.    [provider]  Cholecalciferol (VITAMIN D-3 PO) Take 1 tablet by mouth daily.    [provider]  cyanocobalamin (,VITAMIN B-12,) 1000 MCG/ML injection Inject 1,000 mcg into the muscle every 30 (thirty) days. 01/10/21   [provider]  donepezil (ARICEPT) 5 MG tablet Take 5 mg by mouth at bedtime.    [provider]  escitalopram (LEXAPRO) 5 MG tablet TAKE ONE TABLET BY MOUTH ONCE DAILY 12/30/23   Gwynneth Munson Sung Amabile, PA-C  FIBER PO Take 4 mg by mouth daily.    [provider]  hydrochlorothiazide (HYDRODIURIL) 12.5 MG tablet Take 12.5 mg by mouth every morning. 10/08/23   [provider]  memantine (NAMENDA XR) 7 MG CP24 24 hr capsule Take 7 mg by mouth daily. 10/11/23   [provider]  Misc Natural Products (FIBER 7 PO) Fiber    [provider]  nitrofurantoin (MACRODANTIN) 100 MG capsule Take 100 mg by mouth 2 (two) times daily. Patient not taking: Reported on 10/26/2023 04/07/23   [provider]      Allergies     Sulfa antibiotics and Naproxen sodium    Review of Systems   Review of Systems  Physical Exam Updated Vital Signs BP 125/64   Pulse 65   Temp 98.2 F (36.8 C) (Oral)   Resp 20   Ht 4\' 10"  (1.473 m)   Wt 49.9 kg   SpO2 97%   BMI 22.99 kg/m  Physical Exam  ED Results / Procedures / Treatments   Labs (all labs ordered are listed, but only abnormal results are displayed) Labs Reviewed - No data to display  EKG None  Radiology CT Cervical Spine Wo Contrast Result Date: 01/15/2024 CLINICAL DATA:  Neck trauma (Age >= 65y) Trip and fall striking back of head. EXAM: CT CERVICAL SPINE WITHOUT CONTRAST TECHNIQUE: Multidetector CT imaging of the cervical spine was performed without intravenous contrast. Multiplanar CT image reconstructions were also generated. RADIATION DOSE REDUCTION: This exam was performed according to the departmental dose-optimization program which includes automated exposure control, adjustment of the mA and/or kV according to patient size and/or use of iterative reconstruction technique. COMPARISON:  02/16/2023 FINDINGS: Alignment: Normal. Skull base and vertebrae: No acute fracture. Vertebral body heights are maintained. The dens and skull base are intact. Soft tissues and spinal canal: No prevertebral fluid or swelling. No visible canal hematoma. Disc levels: Mild for age multilevel degenerative disc disease. Moderate multilevel facet hypertrophy. Stable degenerative change from prior. Upper chest: No acute or unexpected  findings. Other: Carotid calcifications. IMPRESSION: Degenerative change in the cervical spine. No fracture or traumatic subluxation. Electronically Signed   By: Narda Rutherford M.D.   On: 01/15/2024 13:00   CT Head Wo Contrast Result Date: 01/15/2024 CLINICAL DATA:  Head trauma, intracranial arterial injury suspected Slip and fall this morning.  Hit back of head. EXAM: CT HEAD WITHOUT CONTRAST TECHNIQUE: Contiguous axial images were obtained from the  base of the skull through the vertex without intravenous contrast. RADIATION DOSE REDUCTION: This exam was performed according to the departmental dose-optimization program which includes automated exposure control, adjustment of the mA and/or kV according to patient size and/or use of iterative reconstruction technique. COMPARISON:  Head CT 02/16/2023 FINDINGS: Brain: No intracranial hemorrhage, mass effect, or midline shift. Stable atrophy and chronic small vessel ischemia. No hydrocephalus. The basilar cisterns are patent. No evidence of territorial infarct or acute ischemia. No extra-axial or intracranial fluid collection. Vascular: Atherosclerosis of skullbase vasculature without hyperdense vessel or abnormal calcification. Skull: No fracture or focal lesion. Sinuses/Orbits: No acute finding.  Bilateral cataract resection Other: Left parietal scalp hematoma. IMPRESSION: 1. Left parietal scalp hematoma. No acute intracranial abnormality. No skull fracture. 2. Stable atrophy and chronic small vessel ischemia. Electronically Signed   By: Narda Rutherford M.D.   On: 01/15/2024 12:57    Procedures Procedures  {Document cardiac monitor, telemetry assessment procedure when appropriate:1}  Medications Ordered in ED Medications - No data to display  ED Course/ Medical Decision Making/ A&P   {   Click here for ABCD2, HEART and other calculatorsREFRESH Note before signing :1}                              Medical Decision Making Amount and/or Complexity of Data Reviewed Radiology: ordered.   Patient with occipital contusion.  She will take Tylenol for pain and follow-up as needed  {Document critical care time when appropriate:1} {Document review of labs and clinical decision tools ie heart score, Chads2Vasc2 etc:1}  {Document your independent review of radiology images, and any outside records:1} {Document your discussion with family members, caretakers, and with consultants:1} {Document social  determinants of health affecting pt's care:1} {Document your decision making why or why not admission, treatments were needed:1} Final Clinical Impression(s) / ED Diagnoses Final diagnoses:  Fall, initial encounter  Injury of head, initial encounter    Rx / DC Orders ED Discharge Orders     None

## 2024-01-15 NOTE — ED Notes (Signed)
 Pt ambulated to ED room with walker. A&O x4.  Pt stated she slipped and fell this morning hitting back of head and bottom denies passing out. Lump on posterior mid portion of head, pt stated it is tender to touch. No bleeding or open wound seen on pt. Denies dizziness, headaches, or N/V.   Family stated pt has had several falls and this is the 2nd time she has hit her head.

## 2024-02-09 ENCOUNTER — Encounter: Payer: Self-pay | Admitting: Physician Assistant

## 2024-02-09 ENCOUNTER — Other Ambulatory Visit: Payer: Self-pay

## 2024-02-09 MED ORDER — ESCITALOPRAM OXALATE 5 MG PO TABS: 5.0000 mg | ORAL_TABLET | Freq: Every day | ORAL | 1 refills | Status: AC

## 2024-04-17 ENCOUNTER — Encounter (HOSPITAL_COMMUNITY): Payer: Self-pay | Admitting: Hematology

## 2024-04-24 ENCOUNTER — Ambulatory Visit: Admitting: Physician Assistant

## 2024-04-26 ENCOUNTER — Ambulatory Visit: Payer: Self-pay | Admitting: Physician Assistant

## 2024-06-28 ENCOUNTER — Encounter (HOSPITAL_COMMUNITY): Payer: Self-pay | Admitting: Oncology

## 2024-06-28 ENCOUNTER — Emergency Department (HOSPITAL_COMMUNITY)

## 2024-06-28 ENCOUNTER — Other Ambulatory Visit: Payer: Self-pay

## 2024-06-28 ENCOUNTER — Encounter (HOSPITAL_COMMUNITY): Payer: Self-pay

## 2024-06-28 ENCOUNTER — Inpatient Hospital Stay (HOSPITAL_COMMUNITY)
Admission: EM | Admit: 2024-06-28 | Discharge: 2024-07-03 | DRG: 481 | Disposition: A | Discharge status: Skilled Nursing Facility | Attending: Internal Medicine | Admitting: Internal Medicine

## 2024-06-28 DIAGNOSIS — D649 Anemia, unspecified: Secondary | ICD-10-CM | POA: Diagnosis present

## 2024-06-28 DIAGNOSIS — E440 Moderate protein-calorie malnutrition: Secondary | ICD-10-CM | POA: Diagnosis present

## 2024-06-28 DIAGNOSIS — S72001D Fracture of unspecified part of neck of right femur, subsequent encounter for closed fracture with routine healing: Secondary | ICD-10-CM | POA: Diagnosis not present

## 2024-06-28 DIAGNOSIS — Z8249 Family history of ischemic heart disease and other diseases of the circulatory system: Secondary | ICD-10-CM

## 2024-06-28 DIAGNOSIS — Y92000 Kitchen of unspecified non-institutional (private) residence as  the place of occurrence of the external cause: Secondary | ICD-10-CM

## 2024-06-28 DIAGNOSIS — Z807 Family history of other malignant neoplasms of lymphoid, hematopoietic and related tissues: Secondary | ICD-10-CM | POA: Diagnosis not present

## 2024-06-28 DIAGNOSIS — N3941 Urge incontinence: Secondary | ICD-10-CM | POA: Diagnosis present

## 2024-06-28 DIAGNOSIS — S72001G Fracture of unspecified part of neck of right femur, subsequent encounter for closed fracture with delayed healing: Secondary | ICD-10-CM | POA: Diagnosis not present

## 2024-06-28 DIAGNOSIS — D509 Iron deficiency anemia, unspecified: Secondary | ICD-10-CM | POA: Diagnosis present

## 2024-06-28 DIAGNOSIS — Z882 Allergy status to sulfonamides status: Secondary | ICD-10-CM

## 2024-06-28 DIAGNOSIS — S72141A Displaced intertrochanteric fracture of right femur, initial encounter for closed fracture: Principal | ICD-10-CM | POA: Diagnosis present

## 2024-06-28 DIAGNOSIS — G309 Alzheimer's disease, unspecified: Secondary | ICD-10-CM | POA: Diagnosis present

## 2024-06-28 DIAGNOSIS — F0280 Dementia in other diseases classified elsewhere without behavioral disturbance: Secondary | ICD-10-CM | POA: Diagnosis present

## 2024-06-28 DIAGNOSIS — K219 Gastro-esophageal reflux disease without esophagitis: Secondary | ICD-10-CM | POA: Diagnosis present

## 2024-06-28 DIAGNOSIS — Z888 Allergy status to other drugs, medicaments and biological substances status: Secondary | ICD-10-CM

## 2024-06-28 DIAGNOSIS — E876 Hypokalemia: Secondary | ICD-10-CM | POA: Diagnosis present

## 2024-06-28 DIAGNOSIS — R54 Age-related physical debility: Secondary | ICD-10-CM | POA: Diagnosis present

## 2024-06-28 DIAGNOSIS — Z90710 Acquired absence of both cervix and uterus: Secondary | ICD-10-CM

## 2024-06-28 DIAGNOSIS — S72001A Fracture of unspecified part of neck of right femur, initial encounter for closed fracture: Principal | ICD-10-CM | POA: Diagnosis present

## 2024-06-28 DIAGNOSIS — Z8781 Personal history of (healed) traumatic fracture: Secondary | ICD-10-CM

## 2024-06-28 DIAGNOSIS — D72829 Elevated white blood cell count, unspecified: Secondary | ICD-10-CM | POA: Diagnosis present

## 2024-06-28 DIAGNOSIS — T796XXA Traumatic ischemia of muscle, initial encounter: Secondary | ICD-10-CM | POA: Diagnosis present

## 2024-06-28 DIAGNOSIS — W010XXA Fall on same level from slipping, tripping and stumbling without subsequent striking against object, initial encounter: Secondary | ICD-10-CM | POA: Diagnosis present

## 2024-06-28 DIAGNOSIS — Z7401 Bed confinement status: Secondary | ICD-10-CM | POA: Diagnosis not present

## 2024-06-28 DIAGNOSIS — Z79899 Other long term (current) drug therapy: Secondary | ICD-10-CM | POA: Diagnosis not present

## 2024-06-28 DIAGNOSIS — T502X5A Adverse effect of carbonic-anhydrase inhibitors, benzothiadiazides and other diuretics, initial encounter: Secondary | ICD-10-CM | POA: Diagnosis present

## 2024-06-28 DIAGNOSIS — E538 Deficiency of other specified B group vitamins: Secondary | ICD-10-CM | POA: Diagnosis present

## 2024-06-28 DIAGNOSIS — M25551 Pain in right hip: Secondary | ICD-10-CM | POA: Diagnosis present

## 2024-06-28 DIAGNOSIS — E559 Vitamin D deficiency, unspecified: Secondary | ICD-10-CM | POA: Diagnosis present

## 2024-06-28 DIAGNOSIS — G4730 Sleep apnea, unspecified: Secondary | ICD-10-CM | POA: Diagnosis not present

## 2024-06-28 DIAGNOSIS — M6282 Rhabdomyolysis: Secondary | ICD-10-CM | POA: Diagnosis present

## 2024-06-28 DIAGNOSIS — E860 Dehydration: Secondary | ICD-10-CM | POA: Diagnosis present

## 2024-06-28 DIAGNOSIS — I10 Essential (primary) hypertension: Secondary | ICD-10-CM | POA: Diagnosis present

## 2024-06-28 DIAGNOSIS — R296 Repeated falls: Secondary | ICD-10-CM | POA: Diagnosis present

## 2024-06-28 LAB — CBC WITH DIFFERENTIAL/PLATELET
Abs Granulocyte: 16.4 K/uL — ABNORMAL HIGH (ref 1.5–6.5)
Abs Immature Granulocytes: 0.11 K/uL — ABNORMAL HIGH (ref 0.00–0.07)
Basophils Absolute: 0 K/uL (ref 0.0–0.1)
Basophils Relative: 0 %
Eosinophils Absolute: 0 K/uL (ref 0.0–0.5)
Eosinophils Relative: 0 %
HCT: 37 % (ref 36.0–46.0)
Hemoglobin: 12.8 g/dL (ref 12.0–15.0)
Immature Granulocytes: 1 %
Lymphocytes Relative: 5 %
Lymphs Abs: 0.8 K/uL (ref 0.7–4.0)
MCH: 31.5 pg (ref 26.0–34.0)
MCHC: 34.6 g/dL (ref 30.0–36.0)
MCV: 91.1 fL (ref 80.0–100.0)
Monocytes Absolute: 0.6 K/uL (ref 0.1–1.0)
Monocytes Relative: 4 %
Neutro Abs: 16.4 K/uL — ABNORMAL HIGH (ref 1.7–7.7)
Neutrophils Relative %: 90 %
Platelets: UNDETERMINED K/uL (ref 150–400)
RBC: 4.06 MIL/uL (ref 3.87–5.11)
RDW: 13.5 % (ref 11.5–15.5)
WBC: 18 K/uL — ABNORMAL HIGH (ref 4.0–10.5)
nRBC: 0 % (ref 0.0–0.2)

## 2024-06-28 LAB — BASIC METABOLIC PANEL WITH GFR
Anion gap: 18 — ABNORMAL HIGH (ref 5–15)
BUN: 19 mg/dL (ref 8–23)
CO2: 24 mmol/L (ref 22–32)
Calcium: 9.5 mg/dL (ref 8.9–10.3)
Chloride: 94 mmol/L — ABNORMAL LOW (ref 98–111)
Creatinine, Ser: 0.63 mg/dL (ref 0.44–1.00)
GFR, Estimated: >60 mL/min (ref >60)
Glucose, Bld: 162 mg/dL — ABNORMAL HIGH (ref 70–99)
Potassium: 2.7 mmol/L — CL (ref 3.5–5.1)
Sodium: 136 mmol/L (ref 135–145)

## 2024-06-28 LAB — TYPE AND SCREEN
ABO/RH(D): O POS
Antibody Screen: NEGATIVE

## 2024-06-28 LAB — MAGNESIUM: Magnesium: 1.6 mg/dL — ABNORMAL LOW (ref 1.7–2.4)

## 2024-06-28 LAB — CK: Total CK: 1428 U/L — ABNORMAL HIGH (ref 38–234)

## 2024-06-28 MED ORDER — PANTOPRAZOLE SODIUM 40 MG PO TBEC
40.0000 mg | DELAYED_RELEASE_TABLET | Freq: Every day | ORAL | Status: DC
  Administered 2024-06-29 – 2024-07-03 (×5): 40 mg via ORAL
  Filled 2024-06-28 (×5): qty 1

## 2024-06-28 MED ORDER — BISACODYL 5 MG PO TBEC: 5.0000 mg | DELAYED_RELEASE_TABLET | Freq: Every day | ORAL | Status: DC | PRN

## 2024-06-28 MED ORDER — HEPARIN SODIUM (PORCINE) 5000 UNIT/ML IJ SOLN
5000.0000 [IU] | Freq: Three times a day (TID) | INTRAMUSCULAR | Status: DC
  Administered 2024-06-28 – 2024-06-29 (×3): 5000 [IU] via SUBCUTANEOUS
  Filled 2024-06-28 (×2): qty 1

## 2024-06-28 MED ORDER — SODIUM CHLORIDE 0.9 % IV BOLUS
1000.0000 mL | Freq: Once | INTRAVENOUS | Status: AC
  Administered 2024-06-28 (×2): 1000 mL via INTRAVENOUS

## 2024-06-28 MED ORDER — ESCITALOPRAM OXALATE 10 MG PO TABS
5.0000 mg | ORAL_TABLET | Freq: Every day | ORAL | Status: DC
  Administered 2024-06-29 – 2024-07-03 (×5): 5 mg via ORAL
  Filled 2024-06-28 (×5): qty 1

## 2024-06-28 MED ORDER — ACETAMINOPHEN 325 MG PO TABS
650.0000 mg | ORAL_TABLET | Freq: Four times a day (QID) | ORAL | Status: DC
  Administered 2024-06-28 – 2024-07-03 (×18): 650 mg via ORAL
  Filled 2024-06-28 (×18): qty 2

## 2024-06-28 MED ORDER — TRAZODONE HCL 50 MG PO TABS
25.0000 mg | ORAL_TABLET | Freq: Every evening | ORAL | Status: DC | PRN
  Administered 2024-06-28 – 2024-07-02 (×5): 25 mg via ORAL
  Filled 2024-06-28 (×4): qty 1

## 2024-06-28 MED ORDER — MEMANTINE HCL ER 7 MG PO CP24
7.0000 mg | ORAL_CAPSULE | Freq: Every day | ORAL | Status: DC
  Administered 2024-06-29 – 2024-07-03 (×5): 7 mg via ORAL
  Filled 2024-06-28 (×7): qty 1

## 2024-06-28 MED ORDER — METHOCARBAMOL 500 MG PO TABS
500.0000 mg | ORAL_TABLET | Freq: Four times a day (QID) | ORAL | Status: DC | PRN
  Administered 2024-06-28 – 2024-07-01 (×6): 500 mg via ORAL
  Filled 2024-06-28 (×5): qty 1

## 2024-06-28 MED ORDER — POTASSIUM CHLORIDE 10 MEQ/100ML IV SOLN
10.0000 meq | INTRAVENOUS | Status: AC
  Administered 2024-06-28 (×8): 10 meq via INTRAVENOUS
  Filled 2024-06-28 (×4): qty 100

## 2024-06-28 MED ORDER — MAGNESIUM SULFATE 4 GM/100ML IV SOLN
4.0000 g | Freq: Once | INTRAVENOUS | Status: AC
  Administered 2024-06-28 (×2): 4 g via INTRAVENOUS
  Filled 2024-06-28 (×2): qty 100

## 2024-06-28 MED ORDER — METHOCARBAMOL 1000 MG/10ML IJ SOLN: 500.0000 mg | Freq: Four times a day (QID) | INTRAMUSCULAR | Status: DC | PRN

## 2024-06-28 MED ORDER — VITAMIN D 25 MCG (1000 UNIT) PO TABS
1000.0000 [IU] | ORAL_TABLET | Freq: Every day | ORAL | Status: DC
  Administered 2024-06-29 – 2024-07-03 (×5): 1000 [IU] via ORAL
  Filled 2024-06-28 (×5): qty 1

## 2024-06-28 MED ORDER — LACTATED RINGERS IV SOLN: INTRAVENOUS | Status: AC

## 2024-06-28 MED ORDER — HYDROMORPHONE HCL 1 MG/ML IJ SOLN
0.5000 mg | Freq: Once | INTRAMUSCULAR | Status: AC
  Administered 2024-06-28 (×2): 0.5 mg via INTRAVENOUS
  Filled 2024-06-28: qty 0.5

## 2024-06-28 MED ORDER — ACETAMINOPHEN 500 MG PO TABS
1000.0000 mg | ORAL_TABLET | Freq: Once | ORAL | Status: AC
  Administered 2024-06-28 (×2): 1000 mg via ORAL
  Filled 2024-06-28: qty 2

## 2024-06-28 MED ORDER — MORPHINE SULFATE (PF) 2 MG/ML IV SOLN: 0.5000 mg | INTRAVENOUS | Status: DC | PRN

## 2024-06-28 MED ORDER — POTASSIUM CHLORIDE CRYS ER 20 MEQ PO TBCR
40.0000 meq | EXTENDED_RELEASE_TABLET | Freq: Once | ORAL | Status: AC
  Administered 2024-06-28 (×2): 40 meq via ORAL
  Filled 2024-06-28: qty 2

## 2024-06-28 MED ORDER — OXYCODONE HCL 5 MG PO TABS
5.0000 mg | ORAL_TABLET | ORAL | Status: DC | PRN
  Administered 2024-06-30 – 2024-07-01 (×3): 5 mg via ORAL
  Filled 2024-06-28 (×3): qty 1

## 2024-06-28 MED ORDER — HYDRALAZINE HCL 20 MG/ML IJ SOLN: 10.0000 mg | INTRAMUSCULAR | Status: DC | PRN

## 2024-06-28 MED ORDER — ACETAMINOPHEN 325 MG PO TABS: 650.0000 mg | ORAL_TABLET | Freq: Four times a day (QID) | ORAL | Status: DC

## 2024-06-28 MED ORDER — AMLODIPINE BESYLATE 5 MG PO TABS
5.0000 mg | ORAL_TABLET | Freq: Every day | ORAL | Status: DC
  Administered 2024-06-29 – 2024-07-03 (×5): 5 mg via ORAL
  Filled 2024-06-28 (×5): qty 1

## 2024-06-28 NOTE — ED Notes (Signed)
 Pt did pee in the bed pan but the specimen was contaminated with a BM.

## 2024-06-28 NOTE — TOC CM/SW Note (Signed)
 Transition of Care [facility]) - Inpatient Brief Assessment   Patient Details  Name: Wanda [name] MRN: [id number] Date of Birth: [birth year]  [facility] CM/SW Contact:    Lucie Lunger, LCSWA Phone Number: 06/28/2024, 11:08 AM   Clinical Narrative: CSW notes TOC consult for SNF placement. CSW also notes per chart review that pt is transferring to [facility] for ortho. TOC will follow pt upon arrival to [facility].  Transition of Care Asessment: Insurance and Status: Insurance coverage has been reviewed Patient has primary care physician: Yes Home environment has been reviewed: From home Prior level of function:: Independent Prior/Current Home Services: No current home services Social Drivers of Health Review: SDOH reviewed no interventions necessary Readmission risk has been reviewed: Yes Transition of care needs: transition of care needs identified, TOC will continue to follow

## 2024-06-28 NOTE — ED Notes (Signed)
 [place] placed per nurse

## 2024-06-28 NOTE — ED Triage Notes (Signed)
 Pt from home with fall after tripping last night. Pt has right hip pain with some shortening. Pt is not on blood thinners. Hx of dementia.

## 2024-06-28 NOTE — ED Notes (Signed)
[facility] called to transport patient. Nurse notified 

## 2024-06-28 NOTE — ED Notes (Signed)
 Nasal cannula applied d/t declining oxygen  after pain meds

## 2024-06-28 NOTE — ED Notes (Signed)
 Patient transported to CT

## 2024-06-28 NOTE — H&P (Signed)
 History and Physical  Wanda [name]  Wanda [name] FMW:[id number] DOB: 1933/06/29 DOA: 06/28/2024  PCP: Wanda Neace Morna FALCON, NP  Patient coming from: Home by [facility]  Level of care: Med-Surg  I have personally briefly reviewed patient's old medical records in [facility]  Chief Complaint: fall at home, right hip pain  HPI: Wanda [name] is a 89 year old female with Alzheimer's dementia, falling frequently at home, frequent UTIs, hypertension, B12 deficiency, hypokalemia from diuretics, GERD, gastritis, IDA, sleep apnea, urge incontinence and vitamin D  deficiency, history of left hip fracture who still lives alone.  She apparently had a fall at 8:30 PM yesterday evening as her son reports he was able to see on a ring camera.  She apparently tripped and fell.  She apparently lay on the floor for several hours before being found by a relative this morning.  EMS was called.  Patient was very confused and complained of right hip pain.  Patient not able to give more history.  X-rays confirm severely displaced right hip fracture.  Workup notable for leukocytosis, hypokalemia and elevated CK.  Orthopedics not available at [facility].  Dr. Sharl was consulted at [facility] who requested patient transfer to [name] for operative management.   Past Medical History:  Diagnosis Date   Anemia    Cystocele    Dementia (HCC)    Diverticulosis 10/20/2010   Left-sided on colonoscopy by Dr. Shaaron 10/20/10   Gastric polyps 10/02/2013   Gastritis 12/08/2010   EGD by Dr. Shirlean hiatal hernia, duodenal diverticulum, chronic gastritis   GERD (gastroesophageal reflux disease)    Heart murmur    Hiatal hernia 10/02/2013   HTN (hypertension)    IDA (iron deficiency anemia)    Multiple gastric erosions 10/02/2013   Osteoarthritis    Radial fracture    (right) Undergoing treatment by Dr. Margrette currently   Sleep apnea    STOP BANG SCORE 4   Urge incontinence    Vitamin D  deficiency     Past  Surgical History:  Procedure Laterality Date   ABDOMINAL HYSTERECTOMY     ANTERIOR AND POSTERIOR REPAIR  05/17/2012   Procedure: ANTERIOR (CYSTOCELE) AND POSTERIOR REPAIR (RECTOCELE);  Surgeon: Norleen LULLA Server, MD;  Location: [facility];  Service: Gynecology;  Laterality: N/A;   BIOPSY  11/22/2018   Procedure: BIOPSY;  Surgeon: Shaaron Lamar HERO, MD;  Location: [facility];  Service: Endoscopy;;   BIOPSY  02/12/2020   Procedure: BIOPSY;  Surgeon: Shaaron Lamar HERO, MD;  Location: [facility];  Service: Endoscopy;;  gastric biopsy   CATARACT EXTRACTION, BILATERAL     COLONOSCOPY  02/29/2012   Dr. Shaaron: colonic diverticulosis, minimal internal hemorrhoids, benign polyp   ESOPHAGOGASTRODUODENOSCOPY  02/29/2012   Dr. Shaaron: atonic esophagus, moderate-sized hiatal hernia, fundal gland polyps   ESOPHAGOGASTRODUODENOSCOPY N/A 10/02/2013   Dr. Shaaron- multiple Ole evelene regal gastric polyps- not manipulated   ESOPHAGOGASTRODUODENOSCOPY N/A 01/14/2015   Dr. Shaaron: Large hiatal hernia, otherwise normal EGD   ESOPHAGOGASTRODUODENOSCOPY (EGD) WITH PROPOFOL  N/A 11/22/2018   Dr. Shaaron: normal esophagus, large hiatal hernia, erosive gastropathy s/p biopsy, normal duodenum. Reactive gastropathy   ESOPHAGOGASTRODUODENOSCOPY (EGD) WITH PROPOFOL  N/A 02/12/2020   normal esophagus, large hiatal hernia, [name] lesions, benign gastric biopsy   ESOPHAGOGASTRODUODENOSCOPY (EGD) WITH PROPOFOL  N/A 12/26/2020   Procedure: ESOPHAGOGASTRODUODENOSCOPY (EGD) WITH PROPOFOL ;  Surgeon: Shaaron Lamar HERO, MD;  [facility];  Service: Endoscopy;  Laterality: N/A;   ESOPHAGOGASTRODUODENOSCOPY (EGD) WITH PROPOFOL  N/A 03/23/2021   Procedure:  ESOPHAGOGASTRODUODENOSCOPY (EGD) WITH PROPOFOL ;  Surgeon: Eartha Flavors, Toribio, MD;  Location: [facility];  Service: Gastroenterology;  Laterality: N/A;   GIVENS CAPSULE STUDY  02/29/2012   No explanation for IDA. Possible extrinsic compression but negative CT.    GIVENS CAPSULE STUDY  N/A 12/26/2020   Procedure: GIVENS CAPSULE STUDY;  Surgeon: Shaaron Lamar HERO, MD;  [facility]: [facility];  Service: Endoscopy;  Laterality: N/A;   INSERTION OF MESH N/A 07/08/2021   Procedure: INSERTION OF MESH;  Surgeon: Rubin Calamity, MD;  Location: [facility] OR;  Service: General;  Laterality: N/A;   INTRAMEDULLARY (IM) NAIL INTERTROCHANTERIC Left 02/18/2023   Procedure: LEFT INTRAMEDULLARY (IM) NAIL INTERTROCHANTERIC;  Surgeon: Onesimo Oneil LABOR, MD;  Location: [facility];  Service: Orthopedics;  Laterality: Left;   SALPINGOOPHORECTOMY  05/17/2012   Procedure: SALPINGO OOPHERECTOMY;  Surgeon: Norleen LULLA Server, MD;  Location: [facility];  Service: Gynecology;  Laterality: Bilateral;   VAGINAL HYSTERECTOMY  05/17/2012   Procedure: HYSTERECTOMY VAGINAL;  Surgeon: Norleen LULLA Server, MD;  Location: [facility];  Service: Gynecology;  Laterality: N/A;   XI ROBOTIC ASSISTED HIATAL HERNIA REPAIR N/A 07/08/2021   Procedure: XI ROBOTIC ASSISTED HIATAL HERNIA REPAIR WITH MESH AND FUNDOPLICATION;  Surgeon: Rubin Calamity, MD;  Location: [facility] OR;  Service: General;  Laterality: N/A;     reports that she has never smoked. She has never used smokeless tobacco. She reports that she does not drink alcohol and does not use drugs.  Allergies  Allergen Reactions   Sulfa Antibiotics     unknown   Naproxen Sodium Rash and Other (See Comments)    Passed out    Family History  Problem Relation Age of Onset   Hypertension Mother    Lymphoma Brother    Anesthesia problems Neg Hx    Hypotension Neg Hx    Malignant hyperthermia Neg Hx    Pseudochol deficiency Neg Hx    Colon cancer Neg Hx     Prior to Admission medications   Medication Sig Start Date End Date Taking? Authorizing Provider  acetaminophen  (TYLENOL ) 500 MG tablet Take 500-1,000 mg by mouth every 6 (six) hours as needed for moderate pain (pain score 4-6).   Yes [provider]  amLODipine  (NORVASC ) 5 MG tablet Take 1 tablet (5 mg total) by mouth daily. 04/15/23   Yes Dean Clarity, MD  Cholecalciferol  (VITAMIN D -3 PO) Take 1 tablet by mouth daily.   Yes [provider]  cyanocobalamin  (,VITAMIN B-12,) 1000 MCG/ML injection Inject 1,000 mcg into the muscle every 30 (thirty) days. 01/10/21  Yes [provider]  escitalopram  (LEXAPRO ) 5 MG tablet Take 1 tablet (5 mg total) by mouth daily. 02/09/24  Yes Wertman, Sara E, PA-C  hydrochlorothiazide  (HYDRODIURIL ) 12.5 MG tablet Take 12.5 mg by mouth every morning. 10/08/23  Yes [provider]  memantine  (NAMENDA  XR) 7 MG CP24 24 hr capsule Take 7 mg by mouth daily. 10/11/23  Yes [provider]  omeprazole  (PRILOSEC ) 20 MG capsule Take 20 mg by mouth daily.   Yes [provider]  Misc Natural Products (FIBER 7 PO) Fiber    [provider]    Physical Exam: Vitals:   06/28/24 1000 06/28/24 1030 06/28/24 1041 06/28/24 1051  BP: (!) 149/83 (!) 165/86    Pulse: 80 82 80 78  Resp:      Temp:      TempSrc:      SpO2: 97% 97% (!) 88% 99%   Constitutional: frail, elderly female,  awake, NAD, calm, obviously uncomfortable Eyes: PERRL, lids and conjunctivae normal ENMT: Mucous membranes are dry. Posterior pharynx clear of any exudate or lesions. Neck: normal, supple, no masses, no thyromegaly Respiratory: clear to auscultation bilaterally, no wheezing, no crackles. Normal respiratory effort. No accessory muscle use.  Cardiovascular: normal s1, s2 sounds, no murmurs / rubs / gallops. No extremity edema. 2+ pedal pulses. No carotid bruits.  Abdomen: no tenderness, no masses palpated. No hepatosplenomegaly. Bowel sounds positive.  Musculoskeletal: right leg feels warm and distal pulses palpable, shortened right leg, externally rotated.  Skin: age related changes No induration Neurologic: CN 2-12 grossly intact. Sensation intact, DTR normal. Strength 5/5 in all 4.  Psychiatric: poor judgment and insight with dementia. Alert and oriented x 1. Normal mood.   Labs  on Admission: I have personally reviewed following labs and imaging studies  CBC: Recent Labs  Lab 06/28/24 0835  WBC 18.0*  NEUTROABS 16.4*  HGB 12.8  HCT 37.0  MCV 91.1  PLT PLATELET CLUMPS NOTED ON SMEAR, UNABLE TO ESTIMATE   Basic Metabolic Panel: Recent Labs  Lab 06/28/24 0835  NA 136  K 2.7*  CL 94*  CO2 24  GLUCOSE 162*  BUN 19  CREATININE 0.63  CALCIUM  9.5   GFR: CrCl cannot be calculated (Unknown ideal weight.). Liver Function Tests: No results for input(s): AST, ALT, ALKPHOS, BILITOT, PROT, ALBUMIN in the last 168 hours. No results for input(s): LIPASE, AMYLASE in the last 168 hours. No results for input(s): AMMONIA in the last 168 hours. Coagulation Profile: No results for input(s): INR, PROTIME in the last 168 hours. Cardiac Enzymes: Recent Labs  Lab 06/28/24 0835  CKTOTAL 1,428*   BNP (last 3 results) No results for input(s): PROBNP in the last 8760 hours. HbA1C: No results for input(s): HGBA1C in the last 72 hours. CBG: No results for input(s): GLUCAP in the last 168 hours. Lipid Profile: No results for input(s): CHOL, HDL, LDLCALC, TRIG, CHOLHDL, LDLDIRECT in the last 72 hours. Thyroid Function Tests: No results for input(s): TSH, T4TOTAL, FREET4, T3FREE, THYROIDAB in the last 72 hours. Anemia Panel: No results for input(s): VITAMINB12, FOLATE, FERRITIN, TIBC, IRON, RETICCTPCT in the last 72 hours. Urine analysis:    Component Value Date/Time   COLORURINE STRAW (A) 02/17/2023 0907   APPEARANCEUR CLEAR 02/17/2023 0907   LABSPEC 1.005 02/17/2023 0907   PHURINE 7.0 02/17/2023 0907   GLUCOSEU NEGATIVE 02/17/2023 0907   HGBUR MODERATE (A) 02/17/2023 0907   BILIRUBINUR NEGATIVE 02/17/2023 0907   KETONESUR NEGATIVE 02/17/2023 0907   PROTEINUR NEGATIVE 02/17/2023 0907   UROBILINOGEN 0.2 05/11/2012 1000   NITRITE NEGATIVE 02/17/2023 0907   LEUKOCYTESUR NEGATIVE 02/17/2023 9092     Radiological Exams on Admission: DG Chest Portable 1 View Result Date: 06/28/2024 CLINICAL DATA:  Fall.  Right hip fracture. EXAM: PORTABLE CHEST 1 VIEW COMPARISON:  February 16, 2023. FINDINGS: Stable cardiomediastinal silhouette. Both lungs are clear. The visualized skeletal structures are unremarkable. IMPRESSION: No active disease. Electronically Signed   By: Lynwood Landy Raddle M.D.   On: 06/28/2024 08:39   DG Hip Unilat With Pelvis 2-3 Views Right Result Date: 06/28/2024 CLINICAL DATA:  Right hip pain after fall last night. EXAM: DG HIP (WITH OR WITHOUT PELVIS) 2-3V RIGHT COMPARISON:  April 30, 2023. FINDINGS: Severely displaced and comminuted intertrochanteric fracture of proximal right femur is noted. Status post surgical internal fixation of old proximal left femoral fracture. IMPRESSION: Severely displaced and comminuted intertrochanteric fracture of proximal right femur. Electronically Signed  By: Lynwood Landy Raddle M.D.   On: 06/28/2024 08:38   CT Cervical Spine Wo Contrast Result Date: 06/28/2024 CLINICAL DATA:  89 year old female status post trip and fall last night. Hip pain. EXAM: CT CERVICAL SPINE WITHOUT CONTRAST TECHNIQUE: Multidetector CT imaging of the cervical spine was performed without intravenous contrast. Multiplanar CT image reconstructions were also generated. RADIATION DOSE REDUCTION: This exam was performed according to the departmental dose-optimization program which includes automated exposure control, adjustment of the mA and/or kV according to patient size and/or use of iterative reconstruction technique. COMPARISON:  Head CT today.  Cervical spine CT 01/15/2024. FINDINGS: Alignment: Stable cervical lordosis. Stable mild degenerative anterolisthesis of C7 on T1, with bilateral facet ankylosis. Bilateral posterior element alignment is within normal limits. Skull base and vertebrae: Chronic osteopenia. Visualized skull base is intact. No atlanto-occipital dissociation. C1 and C2  appear intact and aligned. No acute osseous abnormality identified. Soft tissues and spinal canal: No prevertebral fluid or swelling. No visible canal hematoma. Some negative visible noncontrast neck soft tissues aside from calcified carotid artery atherosclerosis. Disc levels: Mild for age cervical spine degeneration at many levels. Chronic degenerative facet ankylosis at C7-T1. Right greater than left facet hypertrophy in general. Advanced chronic disc and endplate degeneration at C6-C7. Upper chest: Visible upper thoracic levels appear stable. Stable lung apices. IMPRESSION: 1. No acute traumatic injury identified in the cervical spine. 2. Chronic osteopenia and cervical spine degeneration. Electronically Signed   By: VEAR Hurst M.D.   On: 06/28/2024 08:25   CT Head Wo Contrast Result Date: 06/28/2024 CLINICAL DATA:  89 year old female status post trip and fall last night. Hip pain. EXAM: CT HEAD WITHOUT CONTRAST TECHNIQUE: Contiguous axial images were obtained from the base of the skull through the vertex without intravenous contrast. RADIATION DOSE REDUCTION: This exam was performed according to the departmental dose-optimization program which includes automated exposure control, adjustment of the mA and/or kV according to patient size and/or use of iterative reconstruction technique. COMPARISON:  Head CT 01/15/2024. FINDINGS: Brain: Stable cerebral volume. Disproportionate mesial and anterior temporal lobe atrophy (coronal image 39). No midline shift, ventriculomegaly, mass effect, evidence of mass lesion, intracranial hemorrhage or evidence of cortically based acute infarction. Patchy mild to moderate for age cerebral white matter hypodensity is stable. Vascular: From Calcified atherosclerosis at the skull base. No suspicious intracranial vascular hyperdensity. Skull: Stable and intact.  Asymmetric left TMJ degeneration. Sinuses/Orbits: Visualized paranasal sinuses and mastoids are stable and well aerated.  Other: No acute orbit or scalp soft tissue injury identified. Calcified scalp vessel atherosclerosis. IMPRESSION: 1. No acute intracranial abnormality or acute traumatic injury identified. 2. Temporal lobe atrophy, Cerebral Atrophy (ICD10-G31.9). Chronic cerebral white matter changes. Electronically Signed   By: VEAR Hurst M.D.   On: 06/28/2024 08:23   EKG: Independently reviewed.   Assessment/Plan Principal Problem:   Closed right hip fracture (HCC) Active Problems:   Normocytic anemia   GERD   HTN (hypertension)   IDA (iron deficiency anemia)   Hypokalemia   Leukocytosis   Dementia due to Alzheimer's disease (HCC)   History of fracture of left hip   Traumatic rhabdomyolysis (HCC)   Dehydration   Severely displaced right hip fracture -- no ortho available at [facility] -- reached out to Dr. Sharl at [facility] who agreed to consult at [name] for operative management -- hip fracture orderset started -- type and cross -- check vitamin D  -- pain management regimen started -- care order placed to notify Dr. Sharl on arrival  to [name] -- continue NPO status until ortho evaluation -- Fall precautions -- bed rest for now  Mild Rhabdomyolysis -- hydrating with IV fluid -- monitoring CK level  Leukocytosis -- likely reactive  -- no s/s of infection found -- repeat CBC with diff in AM   Dehydration -- continue IV fluid as ordered -- NPO pending surgical consultation   Hypokalemia -- IV replacement ordered in ED -- follow BMP -- holding hydrochlorothiazide  for now -- check Magnesium    GERD -- resume pantoprazole  for GI protection  IDA -- anticipating Hg will trend down some with IV fluid hydration -- monitor CBC daily for now   DVT prophylaxis: sq heparin    Code Status: Full   Family Communication: son at bedside   Disposition Plan: admit to [facility] for orthopedics consultation (Dr. Sharl)   Consults called: orthopedics [place])  Admission status: INP Time spent: 63 mins  Level of care:  Med-Surg Afton Louder MD [facility] How to contact the [facility] Attending or Consulting provider 7A - 7P or covering provider during after hours 7P -7A, for this patient?  Check the care team in [name] and look for a) attending/consulting [facility] provider listed and b) the [facility] team listed Log into [email or web address] and use [place]'s universal password to access. If you do not have the password, please contact the hospital operator. Locate the [facility] provider you are looking for under [facility] and page to a number that you can be directly reached. If you still have difficulty reaching the provider, please page the [facility] (Director on Call) for the Hospitalists listed on amion for assistance.   If 7PM-7AM, please contact night-coverage [email or web address] Password [name]  06/28/2024, 11:16 AM

## 2024-06-28 NOTE — ED Notes (Signed)
 Pt does better with crushed meds,

## 2024-06-28 NOTE — ED Notes (Signed)
 [facility] left with pt and family has belongings.

## 2024-06-28 NOTE — ED Provider Notes (Signed)
 [place] EMERGENCY DEPARTMENT AT [facility] Provider Note  CSN: [id number] Arrival date & time: 06/28/24 9357  Chief Complaint(s) No chief complaint on file.  HPI Wanda HOPPEL is a 89 y.o. female history of dementia, hypertension presenting to the emergency department with fall.  Patient apparently had a fall last night.  Patient does not really know what happened.  Per EMS family saw fall on a ring camera.  Patient reports some pain in the right hip but otherwise denies pain.  Patient otherwise reports she feels normal.  She is not quite sure why she is in the hospital.  History limited due to dementia.   Past Medical History Past Medical History:  Diagnosis Date   Anemia    Cystocele    Dementia (HCC)    Diverticulosis 10/20/2010   Left-sided on colonoscopy by Dr. Shaaron 10/20/10   Gastric polyps 10/02/2013   Gastritis 12/08/2010   EGD by Dr. Shirlean hiatal hernia, duodenal diverticulum, chronic gastritis   GERD (gastroesophageal reflux disease)    Heart murmur    Hiatal hernia 10/02/2013   HTN (hypertension)    IDA (iron deficiency anemia)    Multiple gastric erosions 10/02/2013   Osteoarthritis    Radial fracture    (right) Undergoing treatment by Dr. Margrette currently   Sleep apnea    STOP BANG SCORE 4   Urge incontinence    Vitamin D  deficiency    Patient Active Problem List   Diagnosis Date Noted   Closed right hip fracture (HCC) 06/28/2024   History of fracture of left hip 06/28/2024   Traumatic rhabdomyolysis (HCC) 06/28/2024   Dehydration 06/28/2024   Dementia due to Alzheimer's disease (HCC) 04/23/2023   Closed left hip fracture (HCC) 02/16/2023   UTI (urinary tract infection) 02/16/2023   Hypomagnesemia 02/16/2023   S/P repair of paraesophageal hernia 07/08/2021   UGIB (upper gastrointestinal bleed) 03/23/2021   Upper GI bleed 03/22/2021   Acute GI bleeding 12/26/2020   Acute upper GI bleed 02/11/2020   Loss of weight 06/28/2019    Sleep apnea 11/20/2018   Hypokalemia 11/20/2018   Leukocytosis 11/20/2018   Symptomatic anemia 11/04/2018   Diarrhea, functional 03/15/2015   Fatigue 12/28/2014   Upper abdominal pain 12/28/2014   B12 deficiency 12/28/2014    Class: History of   Acute blood loss anemia 10/02/2013   GI bleed 10/02/2013   Melena 10/02/2013   Hematemesis 10/02/2013   Sinus tachycardia 10/02/2013   Multiple gastric erosions 10/02/2013   Hiatal hernia 10/02/2013   Gastric polyps 10/02/2013   HTN (hypertension)    IDA (iron deficiency anemia)    Adhesive capsulitis of right shoulder 03/01/2012   Iron deficiency anemia 02/02/2012   Distal radius fracture 11/30/2011   Diverticulosis 10/20/2010   Normocytic anemia 10/01/2010   GERD 10/01/2010   FECAL OCCULT BLOOD 10/01/2010   Home Medication(s) Prior to Admission medications   Medication Sig Start Date End Date Taking? Authorizing Provider  acetaminophen  (TYLENOL ) 500 MG tablet Take 500-1,000 mg by mouth every 6 (six) hours as needed for moderate pain (pain score 4-6).   Yes [provider]  amLODipine  (NORVASC ) 5 MG tablet Take 1 tablet (5 mg total) by mouth daily. 04/15/23  Yes Dean Clarity, MD  Cholecalciferol  (VITAMIN D -3 PO) Take 1 tablet by mouth daily.   Yes [provider]  cyanocobalamin  (,VITAMIN B-12,) 1000 MCG/ML injection Inject 1,000 mcg into the muscle every 30 (thirty) days. 01/10/21  Yes [provider]  escitalopram  (LEXAPRO )  5 MG tablet Take 1 tablet (5 mg total) by mouth daily. 02/09/24  Yes Wertman, Sara E, PA-C  hydrochlorothiazide  (HYDRODIURIL ) 12.5 MG tablet Take 12.5 mg by mouth every morning. 10/08/23  Yes [provider]  memantine  (NAMENDA  XR) 7 MG CP24 24 hr capsule Take 7 mg by mouth daily. 10/11/23  Yes [provider]  omeprazole  (PRILOSEC ) 20 MG capsule Take 20 mg by mouth daily.   Yes [provider]  Misc Natural Products (FIBER 7 PO) Fiber    [provider]                                                                                                                                    Past Surgical History Past Surgical History:  Procedure Laterality Date   ABDOMINAL HYSTERECTOMY     ANTERIOR AND POSTERIOR REPAIR  05/17/2012   Procedure: ANTERIOR (CYSTOCELE) AND POSTERIOR REPAIR (RECTOCELE);  Surgeon: Norleen LULLA Server, MD;  Location: [facility];  Service: Gynecology;  Laterality: N/A;   BIOPSY  11/22/2018   Procedure: BIOPSY;  Surgeon: Shaaron Lamar HERO, MD;  Location: [facility];  Service: Endoscopy;;   BIOPSY  02/12/2020   Procedure: BIOPSY;  Surgeon: Shaaron Lamar HERO, MD;  Location: [facility];  Service: Endoscopy;;  gastric biopsy   CATARACT EXTRACTION, BILATERAL     COLONOSCOPY  02/29/2012   Dr. Shaaron: colonic diverticulosis, minimal internal hemorrhoids, benign polyp   ESOPHAGOGASTRODUODENOSCOPY  02/29/2012   Dr. Shaaron: atonic esophagus, moderate-sized hiatal hernia, fundal gland polyps   ESOPHAGOGASTRODUODENOSCOPY N/A 10/02/2013   Dr. Shaaron- multiple Ole evelene regal gastric polyps- not manipulated   ESOPHAGOGASTRODUODENOSCOPY N/A 01/14/2015   Dr. Shaaron: Large hiatal hernia, otherwise normal EGD   ESOPHAGOGASTRODUODENOSCOPY (EGD) WITH PROPOFOL  N/A 11/22/2018   Dr. Shaaron: normal esophagus, large hiatal hernia, erosive gastropathy s/p biopsy, normal duodenum. Reactive gastropathy   ESOPHAGOGASTRODUODENOSCOPY (EGD) WITH PROPOFOL  N/A 02/12/2020   normal esophagus, large hiatal hernia, [name] lesions, benign gastric biopsy   ESOPHAGOGASTRODUODENOSCOPY (EGD) WITH PROPOFOL  N/A 12/26/2020   Procedure: ESOPHAGOGASTRODUODENOSCOPY (EGD) WITH PROPOFOL ;  Surgeon: Shaaron Lamar HERO, MD;  [facility]: [facility];  Service: Endoscopy;  Laterality: N/A;   ESOPHAGOGASTRODUODENOSCOPY (EGD) WITH PROPOFOL  N/A 03/23/2021   Procedure: ESOPHAGOGASTRODUODENOSCOPY (EGD) WITH PROPOFOL ;  Surgeon: Eartha Angelia Sieving, MD;  Location: [facility];   Service: Gastroenterology;  Laterality: N/A;   GIVENS CAPSULE STUDY  02/29/2012   No explanation for IDA. Possible extrinsic compression but negative CT.    GIVENS CAPSULE STUDY N/A 12/26/2020   Procedure: GIVENS CAPSULE STUDY;  Surgeon: Shaaron Lamar HERO, MD;  Location: [facility];  Service: Endoscopy;  Laterality: N/A;   INSERTION OF MESH N/A 07/08/2021   Procedure: INSERTION OF MESH;  Surgeon: Rubin Calamity, MD;  Location: [facility] OR;  Service: General;  Laterality: N/A;   INTRAMEDULLARY (IM) NAIL INTERTROCHANTERIC Left 02/18/2023   Procedure: LEFT INTRAMEDULLARY (IM) NAIL INTERTROCHANTERIC;  Surgeon: Onesimo,  Oneil LABOR, MD;  Location: [facility];  Service: Orthopedics;  Laterality: Left;   SALPINGOOPHORECTOMY  05/17/2012   Procedure: SALPINGO OOPHERECTOMY;  Surgeon: Norleen LULLA Server, MD;  Location: [facility];  Service: Gynecology;  Laterality: Bilateral;   VAGINAL HYSTERECTOMY  05/17/2012   Procedure: HYSTERECTOMY VAGINAL;  Surgeon: Norleen LULLA Server, MD;  Location: [facility];  Service: Gynecology;  Laterality: N/A;   XI ROBOTIC ASSISTED HIATAL HERNIA REPAIR N/A 07/08/2021   Procedure: XI ROBOTIC ASSISTED HIATAL HERNIA REPAIR WITH MESH AND FUNDOPLICATION;  Surgeon: Rubin Calamity, MD;  Location: [facility] OR;  Service: General;  Laterality: N/A;   Family History Family History  Problem Relation Age of Onset   Hypertension Mother    Lymphoma Brother    Anesthesia problems Neg Hx    Hypotension Neg Hx    Malignant hyperthermia Neg Hx    Pseudochol deficiency Neg Hx    Colon cancer Neg Hx     Social History Social History   Tobacco Use   Smoking status: Never   Smokeless tobacco: Never   Tobacco comments:    Never smoked  Vaping Use   Vaping status: Never Used  Substance Use Topics   Alcohol use: No    Alcohol/week: 0.0 standard drinks of alcohol   Drug use: No   Allergies Sulfa antibiotics and Naproxen sodium  Review of Systems Review of Systems  All other systems reviewed and are  negative.   Physical Exam Vital Signs  I have reviewed the triage vital signs BP 133/82   Pulse 81   Temp 98.3 F (36.8 C) (Oral)   Resp 16   SpO2 96%  Physical Exam Vitals and nursing note reviewed.  Constitutional:      General: She is not in acute distress.    Appearance: She is well-developed.  HENT:     Head: Normocephalic and atraumatic.     Mouth/Throat:     Mouth: Mucous membranes are moist.  Eyes:     Pupils: Pupils are equal, round, and reactive to light.  Cardiovascular:     Rate and Rhythm: Normal rate and regular rhythm.     Pulses:          Dorsalis pedis pulses are 2+ on the right side and 2+ on the left side.     Heart sounds: No murmur heard. Pulmonary:     Effort: Pulmonary effort is normal. No respiratory distress.     Breath sounds: Normal breath sounds.  Abdominal:     General: Abdomen is flat.     Palpations: Abdomen is soft.     Tenderness: There is no abdominal tenderness.  Musculoskeletal:        General: No tenderness.     Right lower leg: No edema.     Left lower leg: No edema.     Comments: No midline C, T, L-spine tenderness.  Full active range of motion of bilateral upper extremities with no focal tenderness.  No chest wall tenderness or crepitus.  Left lower extremity with full range of motion throughout, no focal tenderness.  Tenderness over the right hip with limitation of range of motion of the hip but no tenderness over the right knee or ankle, foot.  Skin:    General: Skin is warm and dry.  Neurological:     General: No focal deficit present.     Mental Status: She is alert. Mental status is at baseline.     Comments: Moves all 4 extremities.  Patient oriented  to self, knows she is in the hospital, knows the year but does not remember events of fall or exactly why she is here.  Psychiatric:        Mood and Affect: Mood normal.        Behavior: Behavior normal.     ED Results and Treatments Labs (all labs ordered are listed, but  only abnormal results are displayed) Labs Reviewed  BASIC METABOLIC PANEL WITH GFR - Abnormal; Notable for the following components:      Result Value   Potassium 2.7 (*)    Chloride 94 (*)    Glucose, Bld 162 (*)    Anion gap 18 (*)    All other components within normal limits  CBC WITH DIFFERENTIAL/PLATELET - Abnormal; Notable for the following components:   WBC 18.0 (*)    Neutro Abs 16.4 (*)    Abs Immature Granulocytes 0.11 (*)    Abs Granulocyte 16.4 (*)    All other components within normal limits  CK - Abnormal; Notable for the following components:   Total CK 1,428 (*)    All other components within normal limits  URINALYSIS, W/ REFLEX TO CULTURE (INFECTION SUSPECTED)                                                                                                                          Radiology DG Chest Portable 1 View Result Date: 06/28/2024 CLINICAL DATA:  Fall.  Right hip fracture. EXAM: PORTABLE CHEST 1 VIEW COMPARISON:  February 16, 2023. FINDINGS: Stable cardiomediastinal silhouette. Both lungs are clear. The visualized skeletal structures are unremarkable. IMPRESSION: No active disease. Electronically Signed   By: Lynwood Landy Raddle M.D.   On: 06/28/2024 08:39   DG Hip Unilat With Pelvis 2-3 Views Right Result Date: 06/28/2024 CLINICAL DATA:  Right hip pain after fall last night. EXAM: DG HIP (WITH OR WITHOUT PELVIS) 2-3V RIGHT COMPARISON:  April 30, 2023. FINDINGS: Severely displaced and comminuted intertrochanteric fracture of proximal right femur is noted. Status post surgical internal fixation of old proximal left femoral fracture. IMPRESSION: Severely displaced and comminuted intertrochanteric fracture of proximal right femur. Electronically Signed   By: Lynwood Landy Raddle M.D.   On: 06/28/2024 08:38   CT Cervical Spine Wo Contrast Result Date: 06/28/2024 CLINICAL DATA:  89 year old female status post trip and fall last night. Hip pain. EXAM: CT CERVICAL SPINE WITHOUT  CONTRAST TECHNIQUE: Multidetector CT imaging of the cervical spine was performed without intravenous contrast. Multiplanar CT image reconstructions were also generated. RADIATION DOSE REDUCTION: This exam was performed according to the departmental dose-optimization program which includes automated exposure control, adjustment of the mA and/or kV according to patient size and/or use of iterative reconstruction technique. COMPARISON:  Head CT today.  Cervical spine CT 01/15/2024. FINDINGS: Alignment: Stable cervical lordosis. Stable mild degenerative anterolisthesis of C7 on T1, with bilateral facet ankylosis. Bilateral posterior element alignment is within normal limits. Skull base and vertebrae: Chronic osteopenia. Visualized skull  base is intact. No atlanto-occipital dissociation. C1 and C2 appear intact and aligned. No acute osseous abnormality identified. Soft tissues and spinal canal: No prevertebral fluid or swelling. No visible canal hematoma. Some negative visible noncontrast neck soft tissues aside from calcified carotid artery atherosclerosis. Disc levels: Mild for age cervical spine degeneration at many levels. Chronic degenerative facet ankylosis at C7-T1. Right greater than left facet hypertrophy in general. Advanced chronic disc and endplate degeneration at C6-C7. Upper chest: Visible upper thoracic levels appear stable. Stable lung apices. IMPRESSION: 1. No acute traumatic injury identified in the cervical spine. 2. Chronic osteopenia and cervical spine degeneration. Electronically Signed   By: VEAR Hurst M.D.   On: 06/28/2024 08:25   CT Head Wo Contrast Result Date: 06/28/2024 CLINICAL DATA:  89 year old female status post trip and fall last night. Hip pain. EXAM: CT HEAD WITHOUT CONTRAST TECHNIQUE: Contiguous axial images were obtained from the base of the skull through the vertex without intravenous contrast. RADIATION DOSE REDUCTION: This exam was performed according to the departmental  dose-optimization program which includes automated exposure control, adjustment of the mA and/or kV according to patient size and/or use of iterative reconstruction technique. COMPARISON:  Head CT 01/15/2024. FINDINGS: Brain: Stable cerebral volume. Disproportionate mesial and anterior temporal lobe atrophy (coronal image 39). No midline shift, ventriculomegaly, mass effect, evidence of mass lesion, intracranial hemorrhage or evidence of cortically based acute infarction. Patchy mild to moderate for age cerebral white matter hypodensity is stable. Vascular: From Calcified atherosclerosis at the skull base. No suspicious intracranial vascular hyperdensity. Skull: Stable and intact.  Asymmetric left TMJ degeneration. Sinuses/Orbits: Visualized paranasal sinuses and mastoids are stable and well aerated. Other: No acute orbit or scalp soft tissue injury identified. Calcified scalp vessel atherosclerosis. IMPRESSION: 1. No acute intracranial abnormality or acute traumatic injury identified. 2. Temporal lobe atrophy, Cerebral Atrophy (ICD10-G31.9). Chronic cerebral white matter changes. Electronically Signed   By: VEAR Hurst M.D.   On: 06/28/2024 08:23    Pertinent labs & imaging results that were available during my care of the patient were reviewed by me and considered in my medical decision making (see MDM for details).  Medications Ordered in ED Medications  potassium chloride  10 mEq in 100 mL IVPB (10 mEq Intravenous New Bag/Given 06/28/24 1029)  sodium chloride  0.9 % bolus 1,000 mL (1,000 mLs Intravenous New Bag/Given 06/28/24 0921)  potassium chloride  SA (KLOR-CON  M) CR tablet 40 mEq (40 mEq Oral Given 06/28/24 9077)  acetaminophen  (TYLENOL ) tablet 1,000 mg (1,000 mg Oral Given 06/28/24 1029)  HYDROmorphone  (DILAUDID ) injection 0.5 mg (0.5 mg Intravenous Given 06/28/24 1035)                                                                                                                                      Procedures Procedures  (including critical care time)  Medical Decision Making / ED Course   MDM:  89 year old presenting to  the emergency department with fall.  Patient overall well-appearing, vital signs reassuring.  Circumstances of the fall are unclear given her dementia.  No signs of head trauma or extremity trauma other than right hip.  Will check basic labs, right hip x-ray.  Unclear if patient hit head and she does not remember so we will check CT head and cervical spine.  Will reassess.  Patient reports she does not want any medicine for pain right now.  Clinical Course as of 06/28/24 1044  Wed Jun 28, 2024  0825 Spoke with son who reports that patient fell around 8:30 PM seen on ring camera.  Patient lives alone.  Found by cousin on floor this morning [WS]  1041 Patient follows with Dr. Onesimo, spoke with him but he is on paternity leave.  Also did page Dr. Margrette but did not reply, though he is not on-call today.  Paged Dr. Sharl with orthopedics who is on-call today, he states that they can perform hip repair and request patient transferred to [facility].  Discussed with Dr. Vicci who has accepted patient for admission and will admit to [facility]. Labs otherwise with mild CK elevation concerning for rhabdo. Also with leukocytosis, no infectious symptoms. Also with hypokalemia, repleted.  [WS]    Clinical Course User Index [WS] Francesca Elsie CROME, MD     Additional history obtained: -Additional history obtained from family and ems -External records from outside source obtained and reviewed including: Chart review including previous notes, labs, imaging, consultation notes including prior notes    Lab Tests: -I ordered, reviewed, and interpreted labs.   The pertinent results include:   Labs Reviewed  BASIC METABOLIC PANEL WITH GFR - Abnormal; Notable for the following components:      Result Value   Potassium 2.7 (*)    Chloride 94 (*)    Glucose, Bld 162 (*)    Anion  gap 18 (*)    All other components within normal limits  CBC WITH DIFFERENTIAL/PLATELET - Abnormal; Notable for the following components:   WBC 18.0 (*)    Neutro Abs 16.4 (*)    Abs Immature Granulocytes 0.11 (*)    Abs Granulocyte 16.4 (*)    All other components within normal limits  CK - Abnormal; Notable for the following components:   Total CK 1,428 (*)    All other components within normal limits  URINALYSIS, W/ REFLEX TO CULTURE (INFECTION SUSPECTED)    Notable for leukocytosis, hypokalemia, elevated CK  Imaging Studies ordered: I ordered imaging studies including CT head, neck, XR chest, XR hip On my interpretation imaging demonstrates hip fx  I independently visualized and interpreted imaging. I agree with the radiologist interpretation   Medicines ordered and prescription drug management: Meds ordered this encounter  Medications   sodium chloride  0.9 % bolus 1,000 mL   potassium chloride  SA (KLOR-CON  M) CR tablet 40 mEq   potassium chloride  10 mEq in 100 mL IVPB   acetaminophen  (TYLENOL ) tablet 1,000 mg   HYDROmorphone  (DILAUDID ) injection 0.5 mg    -I have reviewed the patients home medicines and have made adjustments as needed   Consultations Obtained: I requested consultation with the orthopedic surgery (dr. Sharl) ,  and discussed lab and imaging findings as well as pertinent plan - they recommend[facility]    Social Determinants of Health:  Diagnosis or treatment significantly limited by social determinants of health: lives alone   Reevaluation: After the interventions noted above, I reevaluated  the patient and found that their symptoms have improved  Co morbidities that complicate the patient evaluation  Past Medical History:  Diagnosis Date   Anemia    Cystocele    Dementia (HCC)    Diverticulosis 10/20/2010   Left-sided on colonoscopy by Dr. Shaaron 10/20/10   Gastric polyps 10/02/2013   Gastritis 12/08/2010   EGD by Dr.  Shirlean hiatal hernia, duodenal diverticulum, chronic gastritis   GERD (gastroesophageal reflux disease)    Heart murmur    Hiatal hernia 10/02/2013   HTN (hypertension)    IDA (iron deficiency anemia)    Multiple gastric erosions 10/02/2013   Osteoarthritis    Radial fracture    (right) Undergoing treatment by Dr. Margrette currently   Sleep apnea    STOP BANG SCORE 4   Urge incontinence    Vitamin D  deficiency       Dispostion: Disposition decision including need for hospitalization was considered, and patient admitted to the hospital.    Final Clinical Impression(s) / ED Diagnoses Final diagnoses:  Closed fracture of right hip, initial encounter [facility])  Non-traumatic rhabdomyolysis     This chart was dictated using voice recognition software.  Despite best efforts to proofread,  errors can occur which can change the documentation meaning.    Francesca Elsie CROME, MD 06/28/24 1044

## 2024-06-28 NOTE — Hospital Course (Addendum)
 Brief Narrative:  89 year old female with Alzheimer's dementia, falling frequently at home, frequent UTIs, hypertension, B12 deficiency, hypokalemia from diuretics, GERD, gastritis, IDA, sleep apnea, urge incontinence and vitamin D  deficiency, history of left hip fracture who still lives alone. She apparently had a fall at 8:30 PM yesterday evening as her son reports he was able to see on a ring camera.  She was found to have severely displaced right hip fracture therefore transferred from [facility] to [facility] for further surgical evaluation.  Patient underwent ORIF on 8/14.  Routine postop management per orthopedic  SNF placement.    Assessment & Plan:  Principal Problem:   Closed right hip fracture (HCC) Active Problems:   Normocytic anemia   GERD   HTN (hypertension)   IDA (iron deficiency anemia)   Hypokalemia   Leukocytosis   Dementia due to Alzheimer's disease (HCC)   History of fracture of left hip   Traumatic rhabdomyolysis (HCC)   Dehydration   Malnutrition of moderate degree    Severely displaced right hip fracture - Status post ORIF 8/14.  Routine postop management per their service including pain meds and scripts.  PT/OT = SNF  Post op recs: WB: WBAT right LE Abx: ancef  x23 hours post op Dressing: keep intact until follow up, change PRN if soiled or saturated. DVT prophylaxis: lovenox  starting POD1 x4 weeks Follow up: 2 weeks after surgery for a wound check with Dr. Edna at [facility].    Mild Rhabdomyolysis - Resolved with IV fluids   Leukocytosis, stable -Reactive in nature.   Dehydration - Improved   Hypokalemia, improved - Repeat as needed   GERD -PPI  Essential hypertension - Norvasc    IDA -Hemoglobin stable continue to closely monitor  Dementia likely from Alzheimer's - Follows outpatient neurology.  Currently on memantine  and Lexapro    DVT prophylaxis: Lovenox     Code Status: Full Code Family  Communication: Family periodically up-to-date Status is: Inpatient Remains inpatient appropriate because: On going management for hip fx, SNF placement  Subjective: Doing ok no complaints.   Examination:  General exam: Appears calm and comfortable  Respiratory system: Clear to auscultation. Respiratory effort normal. Cardiovascular system: S1 & S2 heard, RRR. No JVD, murmurs, rubs, gallops or clicks. No pedal edema. Gastrointestinal system: Abdomen is nondistended, soft and nontender. No organomegaly or masses felt. Normal bowel sounds heard. Central nervous system: Alert and oriented x 2. No focal neurological deficits. Extremities: Symmetric 5 x 5 power. Skin: No rashes, lesions or ulcers.  Surgical site looks okay Psychiatry: Judgement and insight appear normal. Mood & affect appropriate.

## 2024-06-29 ENCOUNTER — Encounter (HOSPITAL_COMMUNITY): Payer: Self-pay | Admitting: Family Medicine

## 2024-06-29 ENCOUNTER — Inpatient Hospital Stay (HOSPITAL_COMMUNITY): Admitting: Certified Registered"

## 2024-06-29 ENCOUNTER — Inpatient Hospital Stay (HOSPITAL_COMMUNITY): Admitting: Anesthesiology

## 2024-06-29 ENCOUNTER — Inpatient Hospital Stay (HOSPITAL_COMMUNITY)

## 2024-06-29 ENCOUNTER — Encounter (HOSPITAL_COMMUNITY)
Admission: EM | Disposition: A | Discharge status: Skilled Nursing Facility | Payer: Self-pay | Source: Home / Self Care | Attending: Internal Medicine

## 2024-06-29 ENCOUNTER — Encounter (HOSPITAL_COMMUNITY): Payer: Self-pay | Admitting: Anesthesiology

## 2024-06-29 DIAGNOSIS — S72001D Fracture of unspecified part of neck of right femur, subsequent encounter for closed fracture with routine healing: Secondary | ICD-10-CM

## 2024-06-29 DIAGNOSIS — S72001A Fracture of unspecified part of neck of right femur, initial encounter for closed fracture: Secondary | ICD-10-CM

## 2024-06-29 DIAGNOSIS — I10 Essential (primary) hypertension: Secondary | ICD-10-CM | POA: Diagnosis not present

## 2024-06-29 DIAGNOSIS — G4730 Sleep apnea, unspecified: Secondary | ICD-10-CM | POA: Diagnosis not present

## 2024-06-29 HISTORY — PX: INTRAMEDULLARY (IM) NAIL INTERTROCHANTERIC: SHX5875

## 2024-06-29 LAB — CBC
HCT: 33.7 % — ABNORMAL LOW (ref 36.0–46.0)
Hemoglobin: 11.5 g/dL — ABNORMAL LOW (ref 12.0–15.0)
MCH: 31.5 pg (ref 26.0–34.0)
MCHC: 34.1 g/dL (ref 30.0–36.0)
MCV: 92.3 fL (ref 80.0–100.0)
Platelets: 266 K/uL (ref 150–400)
RBC: 3.65 MIL/uL — ABNORMAL LOW (ref 3.87–5.11)
RDW: 13.8 % (ref 11.5–15.5)
WBC: 15.8 K/uL — ABNORMAL HIGH (ref 4.0–10.5)
nRBC: 0 % (ref 0.0–0.2)

## 2024-06-29 LAB — BASIC METABOLIC PANEL WITH GFR
Anion gap: 10 (ref 5–15)
BUN: 10 mg/dL (ref 8–23)
CO2: 26 mmol/L (ref 22–32)
Calcium: 8.4 mg/dL — ABNORMAL LOW (ref 8.9–10.3)
Chloride: 101 mmol/L (ref 98–111)
Creatinine, Ser: 0.64 mg/dL (ref 0.44–1.00)
GFR, Estimated: >60 mL/min (ref >60)
Glucose, Bld: 113 mg/dL — ABNORMAL HIGH (ref 70–99)
Potassium: 2.8 mmol/L — ABNORMAL LOW (ref 3.5–5.1)
Sodium: 137 mmol/L (ref 135–145)

## 2024-06-29 LAB — PHOSPHORUS: Phosphorus: 2.6 mg/dL (ref 2.5–4.6)

## 2024-06-29 LAB — MAGNESIUM: Magnesium: 2.4 mg/dL (ref 1.7–2.4)

## 2024-06-29 LAB — CK: Total CK: 514 U/L — ABNORMAL HIGH (ref 38–234)

## 2024-06-29 LAB — POTASSIUM: Potassium: 3.3 mmol/L — ABNORMAL LOW (ref 3.5–5.1)

## 2024-06-29 SURGERY — FIXATION, FRACTURE, INTERTROCHANTERIC, WITH INTRAMEDULLARY ROD
Anesthesia: General | Laterality: Right

## 2024-06-29 MED ORDER — PHENYLEPHRINE HCL-NACL 20-0.9 MG/250ML-% IV SOLN
INTRAVENOUS | Status: DC | PRN
  Administered 2024-06-29: 30 ug/min via INTRAVENOUS

## 2024-06-29 MED ORDER — IPRATROPIUM-ALBUTEROL 0.5-2.5 (3) MG/3ML IN SOLN: 3.0000 mL | RESPIRATORY_TRACT | Status: DC | PRN

## 2024-06-29 MED ORDER — GLUCAGON HCL RDNA (DIAGNOSTIC) 1 MG IJ SOLR: 1.0000 mg | INTRAMUSCULAR | Status: DC | PRN

## 2024-06-29 MED ORDER — ORAL CARE MOUTH RINSE: 15.0000 mL | Freq: Once | OROMUCOSAL | Status: DC

## 2024-06-29 MED ORDER — OXYCODONE HCL 5 MG PO TABS: 5.0000 mg | ORAL_TABLET | Freq: Once | ORAL | Status: DC | PRN

## 2024-06-29 MED ORDER — CHLORHEXIDINE GLUCONATE 0.12 % MT SOLN
OROMUCOSAL | Status: AC
  Filled 2024-06-29: qty 15

## 2024-06-29 MED ORDER — 0.9 % SODIUM CHLORIDE (POUR BTL) OPTIME
TOPICAL | Status: DC | PRN
  Administered 2024-06-29: 1000 mL

## 2024-06-29 MED ORDER — FENTANYL CITRATE (PF) 250 MCG/5ML IJ SOLN
INTRAMUSCULAR | Status: DC | PRN
  Administered 2024-06-29: 25 ug via INTRAVENOUS

## 2024-06-29 MED ORDER — PHENYLEPHRINE 80 MCG/ML (10ML) SYRINGE FOR IV PUSH (FOR BLOOD PRESSURE SUPPORT)
PREFILLED_SYRINGE | INTRAVENOUS | Status: AC
  Filled 2024-06-29: qty 10

## 2024-06-29 MED ORDER — LIDOCAINE 2% (20 MG/ML) 5 ML SYRINGE
INTRAMUSCULAR | Status: AC
Start: 2024-06-29 — End: 2024-06-29
  Filled 2024-06-29: qty 5

## 2024-06-29 MED ORDER — DEXAMETHASONE SODIUM PHOSPHATE 10 MG/ML IJ SOLN
INTRAMUSCULAR | Status: AC
Start: 2024-06-29 — End: 2024-06-29
  Filled 2024-06-29: qty 1

## 2024-06-29 MED ORDER — CEFAZOLIN SODIUM-DEXTROSE 2-4 GM/100ML-% IV SOLN
2.0000 g | Freq: Three times a day (TID) | INTRAVENOUS | Status: AC
  Administered 2024-06-29 – 2024-06-30 (×2): 2 g via INTRAVENOUS
  Filled 2024-06-29 (×2): qty 100

## 2024-06-29 MED ORDER — CHLORHEXIDINE GLUCONATE 0.12 % MT SOLN: 15.0000 mL | Freq: Once | OROMUCOSAL | Status: DC

## 2024-06-29 MED ORDER — CHLORHEXIDINE GLUCONATE 4 % EX SOLN: 60.0000 mL | Freq: Once | CUTANEOUS | Status: DC

## 2024-06-29 MED ORDER — ENOXAPARIN SODIUM 40 MG/0.4ML IJ SOSY
40.0000 mg | PREFILLED_SYRINGE | INTRAMUSCULAR | Status: DC
  Administered 2024-06-30 – 2024-07-03 (×4): 40 mg via SUBCUTANEOUS
  Filled 2024-06-29 (×4): qty 0.4

## 2024-06-29 MED ORDER — POTASSIUM CHLORIDE 10 MEQ/100ML IV SOLN
10.0000 meq | INTRAVENOUS | Status: AC
  Administered 2024-06-29 (×6): 10 meq via INTRAVENOUS
  Filled 2024-06-29 (×6): qty 100

## 2024-06-29 MED ORDER — CEFAZOLIN SODIUM-DEXTROSE 2-4 GM/100ML-% IV SOLN
2.0000 g | INTRAVENOUS | Status: AC
  Administered 2024-06-29: 2 g via INTRAVENOUS

## 2024-06-29 MED ORDER — OXYCODONE HCL 5 MG/5ML PO SOLN: 5.0000 mg | Freq: Once | ORAL | Status: DC | PRN

## 2024-06-29 MED ORDER — CEFAZOLIN SODIUM-DEXTROSE 2-4 GM/100ML-% IV SOLN
INTRAVENOUS | Status: AC
  Filled 2024-06-29: qty 100

## 2024-06-29 MED ORDER — LACTATED RINGERS IV SOLN: INTRAVENOUS | Status: DC

## 2024-06-29 MED ORDER — BUPIVACAINE-EPINEPHRINE (PF) 0.5% -1:200000 IJ SOLN
INTRAMUSCULAR | Status: DC | PRN
  Administered 2024-06-29: 30 mL via PERINEURAL

## 2024-06-29 MED ORDER — ONDANSETRON HCL 4 MG/2ML IJ SOLN
INTRAMUSCULAR | Status: DC | PRN
  Administered 2024-06-29: 4 mg via INTRAVENOUS

## 2024-06-29 MED ORDER — CLONIDINE HCL (ANALGESIA) 100 MCG/ML EP SOLN
EPIDURAL | Status: DC | PRN
Start: 2024-06-29 — End: 2024-06-29
  Administered 2024-06-29: 60 ug

## 2024-06-29 MED ORDER — HYDRALAZINE HCL 20 MG/ML IJ SOLN: 10.0000 mg | INTRAMUSCULAR | Status: DC | PRN

## 2024-06-29 MED ORDER — FENTANYL CITRATE (PF) 100 MCG/2ML IJ SOLN: 25.0000 ug | INTRAMUSCULAR | Status: DC | PRN

## 2024-06-29 MED ORDER — EPHEDRINE SULFATE-NACL 50-0.9 MG/10ML-% IV SOSY
PREFILLED_SYRINGE | INTRAVENOUS | Status: DC | PRN
  Administered 2024-06-29: 5 mg via INTRAVENOUS

## 2024-06-29 MED ORDER — POVIDONE-IODINE 10 % EX SWAB
2.0000 | Freq: Once | CUTANEOUS | Status: AC
  Administered 2024-06-29: 2 via TOPICAL

## 2024-06-29 MED ORDER — EPHEDRINE 5 MG/ML INJ
INTRAVENOUS | Status: AC
  Filled 2024-06-29: qty 5

## 2024-06-29 MED ORDER — AMISULPRIDE (ANTIEMETIC) 5 MG/2ML IV SOLN: 10.0000 mg | Freq: Once | INTRAVENOUS | Status: DC | PRN

## 2024-06-29 MED ORDER — FENTANYL CITRATE (PF) 250 MCG/5ML IJ SOLN
INTRAMUSCULAR | Status: AC
  Filled 2024-06-29: qty 5

## 2024-06-29 MED ORDER — LIDOCAINE 2% (20 MG/ML) 5 ML SYRINGE
INTRAMUSCULAR | Status: DC | PRN
  Administered 2024-06-29: 40 mg via INTRAVENOUS

## 2024-06-29 MED ORDER — SUGAMMADEX SODIUM 200 MG/2ML IV SOLN
INTRAVENOUS | Status: DC | PRN
  Administered 2024-06-29 (×2): 100 mg via INTRAVENOUS

## 2024-06-29 MED ORDER — DEXAMETHASONE SODIUM PHOSPHATE 4 MG/ML IJ SOLN
INTRAMUSCULAR | Status: DC | PRN
Start: 2024-06-29 — End: 2024-06-29
  Administered 2024-06-29: 8 mg via PERINEURAL

## 2024-06-29 MED ORDER — METOPROLOL TARTRATE 5 MG/5ML IV SOLN: 5.0000 mg | INTRAVENOUS | Status: DC | PRN

## 2024-06-29 MED ORDER — PROPOFOL 10 MG/ML IV BOLUS
INTRAVENOUS | Status: DC | PRN
  Administered 2024-06-29: 70 mg via INTRAVENOUS
  Administered 2024-06-29: 100 ug/kg/min via INTRAVENOUS
  Administered 2024-06-29: 30 mg via INTRAVENOUS

## 2024-06-29 MED ORDER — ONDANSETRON HCL 4 MG/2ML IJ SOLN
INTRAMUSCULAR | Status: AC
Start: 2024-06-29 — End: 2024-06-29
  Filled 2024-06-29: qty 2

## 2024-06-29 MED ORDER — PROPOFOL 1000 MG/100ML IV EMUL
INTRAVENOUS | Status: AC
  Filled 2024-06-29: qty 100

## 2024-06-29 MED ORDER — PROPOFOL 10 MG/ML IV BOLUS
INTRAVENOUS | Status: AC
  Filled 2024-06-29: qty 20

## 2024-06-29 MED ORDER — ROCURONIUM BROMIDE 10 MG/ML (PF) SYRINGE
PREFILLED_SYRINGE | INTRAVENOUS | Status: DC | PRN
  Administered 2024-06-29: 50 mg via INTRAVENOUS

## 2024-06-29 MED ORDER — ROCURONIUM BROMIDE 10 MG/ML (PF) SYRINGE
PREFILLED_SYRINGE | INTRAVENOUS | Status: AC
Start: 2024-06-29 — End: 2024-06-29
  Filled 2024-06-29: qty 10

## 2024-06-29 SURGICAL SUPPLY — 46 items
BAG COUNTER SPONGE SURGICOUNT (BAG) ×1 IMPLANT
BIT DRILL 4.3MMS DISTAL GRDTED (BIT) IMPLANT
BNDG COHESIVE 6X5 TAN ST LF (GAUZE/BANDAGES/DRESSINGS) IMPLANT
BRUSH SCRUB EZ PLAIN DRY (MISCELLANEOUS) ×2 IMPLANT
COVER PERINEAL POST (MISCELLANEOUS) ×1 IMPLANT
COVER SURGICAL LIGHT HANDLE (MISCELLANEOUS) ×2 IMPLANT
DRAPE C-ARM 42X72 X-RAY (DRAPES) ×1 IMPLANT
DRAPE C-ARMOR (DRAPES) ×1 IMPLANT
DRAPE HALF SHEET 40X57 (DRAPES) IMPLANT
DRAPE INCISE IOBAN 66X45 STRL (DRAPES) ×1 IMPLANT
DRAPE STERI IOBAN 125X83 (DRAPES) IMPLANT
DRAPE SURG ORHT 6 SPLT 77X108 (DRAPES) IMPLANT
DRAPE U-SHAPE 47X51 STRL (DRAPES) ×1 IMPLANT
DRESSING MEPILEX FLEX 4X4 (GAUZE/BANDAGES/DRESSINGS) ×1 IMPLANT
DRSG AQUACEL AG ADV 3.5X 4 (GAUZE/BANDAGES/DRESSINGS) IMPLANT
DRSG EMULSION OIL 3X3 NADH (GAUZE/BANDAGES/DRESSINGS) ×1 IMPLANT
DRSG MEPILEX POST OP 4X8 (GAUZE/BANDAGES/DRESSINGS) ×1 IMPLANT
ELECTRODE REM PT RTRN 9FT ADLT (ELECTROSURGICAL) ×1 IMPLANT
GLOVE BIO SURGEON STRL SZ7.5 (GLOVE) ×1 IMPLANT
GLOVE BIO SURGEON STRL SZ8 (GLOVE) ×1 IMPLANT
GLOVE BIOGEL PI IND STRL 7.5 (GLOVE) ×1 IMPLANT
GLOVE BIOGEL PI IND STRL 8 (GLOVE) ×1 IMPLANT
GLOVE SURG ORTHO LTX SZ7.5 (GLOVE) ×2 IMPLANT
GOWN STRL REUS W/ TWL LRG LVL3 (GOWN DISPOSABLE) ×2 IMPLANT
GOWN STRL REUS W/ TWL XL LVL3 (GOWN DISPOSABLE) ×1 IMPLANT
GUIDEPIN VERSANAIL DSP 3.2X444 (ORTHOPEDIC DISPOSABLE SUPPLIES) IMPLANT
GUIDEWIRE BALL NOSE 100CM (WIRE) IMPLANT
KIT BASIN OR (CUSTOM PROCEDURE TRAY) ×1 IMPLANT
KIT TURNOVER KIT B (KITS) ×1 IMPLANT
MANIFOLD NEPTUNE II (INSTRUMENTS) ×1 IMPLANT
NAIL IM AFFIX HIP 11X340 125D (Nail) IMPLANT
NS IRRIG 1000ML POUR BTL (IV SOLUTION) ×1 IMPLANT
PACK GENERAL/GYN (CUSTOM PROCEDURE TRAY) ×1 IMPLANT
PAD ARMBOARD POSITIONER FOAM (MISCELLANEOUS) ×2 IMPLANT
SCREW BONE CORTICAL 5.0X36 (Screw) IMPLANT
SCREW LAG HIP FRA NAIL 10.5X90 (Orthopedic Implant) IMPLANT
STAPLER SKIN PROX 35W (STAPLE) IMPLANT
SUT ETHILON 2 0 PSLX (SUTURE) ×1 IMPLANT
SUT VIC AB 0 CT1 27XBRD ANBCTR (SUTURE) ×1 IMPLANT
SUT VIC AB 1 CT1 27XBRD ANBCTR (SUTURE) ×1 IMPLANT
SUT VIC AB 1 CTX36XBRD ANBCTRL (SUTURE) IMPLANT
SUT VIC AB 2-0 CT1 TAPERPNT 27 (SUTURE) ×1 IMPLANT
SUT VIC AB 3-0 SH 27XBRD (SUTURE) IMPLANT
TOWEL GREEN STERILE (TOWEL DISPOSABLE) ×2 IMPLANT
TOWEL GREEN STERILE FF (TOWEL DISPOSABLE) ×1 IMPLANT
WATER STERILE IRR 1000ML POUR (IV SOLUTION) ×1 IMPLANT

## 2024-06-29 NOTE — Discharge Instructions (Signed)
 Orthopedic Discharge Instructions  Diet: As you were doing prior to hospitalization   Shower:  May shower but keep the wounds dry, use an occlusive plastic wrap, NO SOAKING IN TUB.  If the bandage gets wet, change with a clean dry gauze.  If you have a splint on, leave the splint in place and keep the splint dry with a plastic bag.  Dressing:  You may change your dressing 3-5 days after surgery, unless you have a splint.  If the dressing remains clean and dry it can also be left on until follow up. If you change the dressing replace with clean gauze and tape or ace wrap. If you have a splint, then just leave the splint in place and we will change your bandages during your first follow-up appointment.  If water  gets in the splint or the splint gets saturated please call the clinic and we can see you to change your splint.  If you had hand or foot surgery, we will plan to remove your stitches in about 2 weeks in the office.  For all other surgeries, there are sticky tapes (steri-strips) on your wounds and all the stitches are absorbable.  Leave the steri-strips in place when changing your dressings, they will peel off with time, usually 2-3 weeks.  Activity:  Increase activity slowly as tolerated, but follow the weight bearing instructions below.  The rules on driving is that you can not be taking narcotics while you drive, and you must feel in control of the vehicle.    Weight Bearing:   weight bearing as tolerated.    Blood clot prevention (DVT Prophylaxis): After surgery you are at an increased risk for a blood clot. you were prescribed a blood thinner, lovenox  40mg , to be taken once daily for a total of 4 weeks from surgery to help reduce your risk of getting a blood clot. This will help prevent a blood clot. Signs of a pulmonary embolus (blood clot in the lungs) include sudden short of breath, feeling lightheaded or dizzy, chest pain with a deep breath, rapid pulse rapid breathing. Signs of a blood  clot in your arms or legs include new unexplained swelling and cramping, warm, red or darkened skin around the painful area. Please call the office or 911 right away if these signs or symptoms develop. To prevent constipation: you may use a stool softener such as -  Colace (over the counter) 100 mg by mouth twice a day  Drink plenty of fluids (prune juice may be helpful) and high fiber foods Miralax  (over the counter) for constipation as needed.    Itching:  If you experience itching with your medications, try taking only a single pain pill, or even half a pain pill at a time.  You may take up to 10 pain pills per day, and you can also use benadryl  over the counter for itching or also to help with sleep.   Precautions:  If you experience chest pain or shortness of breath - call 911 immediately for transfer to the hospital emergency department!!   Call office [phone]) for the following: Temperature greater than 101F Persistent nausea and vomiting Severe uncontrolled pain Redness, tenderness, or signs of infection (pain, swelling, redness, odor or green/yellow discharge around the site) Difficulty breathing, headache or visual disturbances Hives Persistent dizziness or light-headedness Extreme fatigue Any other questions or concerns you may have after discharge  In an emergency, call 911 or go to an Emergency Department at a nearby hospital  Follow- Up Appointment:  Please call for an appointment to be seen approximately 2-3 week after surgery in [facility] with your surgeon Dr. Cathlyn Rhein- [phone] Address: [address], [place], [place] [address]

## 2024-06-29 NOTE — Anesthesia Procedure Notes (Signed)
 Procedure Name: Intubation Date/Time: 06/29/2024 5:07 PM  Performed by: Antoinette Haskett A, CRNAPre-anesthesia Checklist: Patient identified, Emergency Drugs available, Suction available and Patient being monitored Patient Re-evaluated:Patient Re-evaluated prior to induction Oxygen  Delivery Method: Circle System Utilized Preoxygenation: Pre-oxygenation with 100% oxygen  Induction Type: IV induction Ventilation: Mask ventilation without difficulty Laryngoscope Size: Mac and 3 Grade View: Grade I Tube type: Oral Tube size: 7.0 mm Number of attempts: 1 Airway Equipment and Method: Stylet and Oral airway Placement Confirmation: ETT inserted through vocal cords under direct vision, positive ETCO2 and breath sounds checked- equal and bilateral Secured at: 22 cm Tube secured with: Tape Dental Injury: Teeth and Oropharynx as per pre-operative assessment

## 2024-06-29 NOTE — Addendum Note (Signed)
 Addendum  created 06/29/24 1138 by Darlyn Rush, MD   Child order released for a procedure order, Clinical Note Signed, Intraprocedure Blocks edited, SmartForm saved

## 2024-06-29 NOTE — Anesthesia Preprocedure Evaluation (Addendum)
 Anesthesia Evaluation  Patient identified by MRN, date of birth, ID band Patient awake    Reviewed: Allergy & Precautions, H&P , Patient's Chart, lab work & pertinent test results  Airway Mallampati: III  TM Distance: >3 FB Neck ROM: Full    Dental no notable dental hx.    Pulmonary neg pulmonary ROS, sleep apnea    Pulmonary exam normal breath sounds clear to auscultation       Cardiovascular Exercise Tolerance: Good hypertension, Pt. on medications + Valvular Problems/Murmurs  Rhythm:Regular Rate:Normal     Neuro/Psych  PSYCHIATRIC DISORDERS     Dementia  Neuromuscular disease negative neurological ROS     GI/Hepatic negative GI ROS, Neg liver ROS, hiatal hernia, PUD,GERD  ,,  Endo/Other  negative endocrine ROS    Renal/GU negative Renal ROS     Musculoskeletal  (+) Arthritis ,    Abdominal   Peds  Hematology negative hematology ROS (+) Blood dyscrasia, anemia   Anesthesia Other Findings   Reproductive/Obstetrics                              Anesthesia Physical Anesthesia Plan  ASA: 3  Anesthesia Plan: Regional   Post-op Pain Management:    Induction:   PONV Risk Score and Plan:   Airway Management Planned:   Additional Equipment:   Intra-op Plan:   Post-operative Plan:   Informed Consent: I have reviewed the patients History and Physical, chart, labs and discussed the procedure including the risks, benefits and alternatives for the proposed anesthesia with the patient or authorized representative who has indicated his/her understanding and acceptance.     History available from chart only and Consent reviewed with POA  Plan Discussed with: CRNA  Anesthesia Plan Comments:         Anesthesia Quick Evaluation

## 2024-06-29 NOTE — Anesthesia Procedure Notes (Signed)
 Anesthesia Regional Block: Femoral nerve block   Pre-Anesthetic Checklist: , timeout performed,  Correct Patient, Correct Site, Correct Laterality,  Correct Procedure, Correct Position, site marked,  Risks and benefits discussed,  Surgical consent,  Pre-op evaluation,  At surgeon's request and post-op pain management  Laterality: Lower and Right  Prep: chloraprep       Needles:  Injection technique: Single-shot  Needle Type: Stimulator Needle - 80     Needle Length: 9cm  Needle Gauge: 22   Needle insertion depth: 6 cm   Additional Needles:   Procedures:, nerve stimulator,,, ultrasound used (permanent image in chart),,     Nerve Stimulator or Paresthesia:  Response: Patellar snap, 0.5 mA  Additional Responses:   Narrative:  Start time: 06/29/2024 11:09 AM End time: 06/29/2024 11:29 AM Injection made incrementally with aspirations every 5 mL.  Performed by: Personally  Anesthesiologist: Darlyn Rush, MD  Additional Notes: BP cuff, EKG monitors applied. Sedation begun. Femoral artery palpated for location of nerve. After nerve location verified with U/S, anesthetic injected incrementally, slowly, and after negative aspirations under direct u/s guidance. Good perineural spread. Patient tolerated well.

## 2024-06-29 NOTE — Interval H&P Note (Signed)
 History and Physical Interval Note:  06/29/2024 4:44 PM  Wanda [name]  has presented today for surgery, with the diagnosis of right hip fracture.  The various methods of treatment have been discussed with the patient and family. After consideration of risks, benefits and other options for treatment, the patient and her son were consented to  Procedure(s): FIXATION, FRACTURE, INTERTROCHANTERIC, WITH INTRAMEDULLARY ROD, RIGHT (Right) as a surgical intervention.  The patient's history has been reviewed, patient examined, no change in status, stable for surgery.  I have reviewed the patient's chart and labs.  Questions were answered to the patient's satisfaction.     Cordella SHAUNNA Rhein

## 2024-06-29 NOTE — Progress Notes (Signed)
 PROGRESS NOTE    Wanda [name]  FMW:[id number] DOB: 14-Oct-1933 DOA: 06/28/2024 PCP: Johnson Morna FALCON, NP    Brief Narrative:  89 year old female with Alzheimer's dementia, falling frequently at home, frequent UTIs, hypertension, B12 deficiency, hypokalemia from diuretics, GERD, gastritis, IDA, sleep apnea, urge incontinence and vitamin D  deficiency, history of left hip fracture who still lives alone. She apparently had a fall at 8:30 PM yesterday evening as her son reports he was able to see on a ring camera.  She was found to have severely displaced right hip fracture therefore transferred from [facility] to [facility] for further surgical evaluation.   Assessment & Plan:  Principal Problem:   Closed right hip fracture (HCC) Active Problems:   Normocytic anemia   GERD   HTN (hypertension)   IDA (iron deficiency anemia)   Hypokalemia   Leukocytosis   Dementia due to Alzheimer's disease (HCC)   History of fracture of left hip   Traumatic rhabdomyolysis (HCC)   Dehydration    Severely displaced right hip fracture -Plans for ORIF by orthopedic.  Routine postop management per their service  Surgery likely delayed secondary to severe hypokalemia   Mild Rhabdomyolysis -Improving with IV fluids   Leukocytosis -Reactive in nature.   Dehydration -IV fluids   Severe hypokalemia -Replete as needed.  Magnesium  levels okay   GERD -PPI  Essential hypertension - Norvasc    IDA -Hemoglobin stable continue to closely monitor  Dementia likely from Alzheimer's - Follows outpatient neurology.  Currently on memantine  and Lexapro    DVT prophylaxis: heparin  injection 5,000 Units Start: 06/28/24 2200    Code Status: Full Code Family Communication:  Called son, Gerlene.  Status is: Inpatient Remains inpatient appropriate because: On going management for hip fx    Subjective:  Doing ok no new complaints.   Examination:  General exam: Appears calm and  comfortable  Respiratory system: Clear to auscultation. Respiratory effort normal. Cardiovascular system: S1 & S2 heard, RRR. No JVD, murmurs, rubs, gallops or clicks. No pedal edema. Gastrointestinal system: Abdomen is nondistended, soft and nontender. No organomegaly or masses felt. Normal bowel sounds heard. Central nervous system: Alert and oriented. No focal neurological deficits. Extremities: Symmetric 5 x 5 power. Skin: No rashes, lesions or ulcers Psychiatry: Judgement and insight appear normal. Mood & affect appropriate.                Diet Orders (From admission, onward)     Start     Ordered   06/29/24 1044  Diet NPO time specified Except for: Sips with Meds, Ice Chips  Diet effective now       Question Answer Comment  Except for Sips with Meds   Except for Ice Chips      06/29/24 1044            Objective: Vitals:   06/28/24 1940 06/29/24 0323 06/29/24 1111 06/29/24 1130  BP: 123/61 (!) 156/75 (!) 150/77 (!) 129/59  Pulse: 74 81 81 92  Resp: 18 18 16 18   Temp: 98.6 F (37 C) 98.1 F (36.7 C)    TempSrc: Oral Oral    SpO2: 94% 98% 98% 99%    Intake/Output Summary (Last 24 hours) at 06/29/2024 1236 Last data filed at 06/29/2024 0300 Gross per 24 hour  Intake 761.9 ml  Output 700 ml  Net 61.9 ml   There were no vitals filed for this visit.  Scheduled Meds:  acetaminophen   650 mg Oral Q6H   amLODipine   5 mg Oral Daily   cholecalciferol   1,000 Units Oral Daily   escitalopram   5 mg Oral Daily   heparin   5,000 Units Subcutaneous Q8H   memantine   7 mg Oral Daily   pantoprazole   40 mg Oral Daily   Continuous Infusions:  potassium chloride  10 mEq (06/29/24 1152)    Nutritional status     There is no height or weight on file to calculate BMI.  Data Reviewed:   CBC: Recent Labs  Lab 06/28/24 0835 06/29/24 0531  WBC 18.0* 15.8*  NEUTROABS 16.4*  --   HGB 12.8 11.5*  HCT 37.0 33.7*  MCV 91.1 92.3  PLT PLATELET CLUMPS NOTED ON SMEAR,  UNABLE TO ESTIMATE 266   Basic Metabolic Panel: Recent Labs  Lab 06/28/24 0835 06/29/24 0531  NA 136 137  K 2.7* 2.8*  CL 94* 101  CO2 24 26  GLUCOSE 162* 113*  BUN 19 10  CREATININE 0.63 0.64  CALCIUM  9.5 8.4*  MG 1.6* 2.4   GFR: CrCl cannot be calculated (Unknown ideal weight.). Liver Function Tests: No results for input(s): AST, ALT, ALKPHOS, BILITOT, PROT, ALBUMIN in the last 168 hours. No results for input(s): LIPASE, AMYLASE in the last 168 hours. No results for input(s): AMMONIA in the last 168 hours. Coagulation Profile: No results for input(s): INR, PROTIME in the last 168 hours. Cardiac Enzymes: Recent Labs  Lab 06/28/24 0835 06/29/24 0531  CKTOTAL 1,428* 514*   BNP (last 3 results) No results for input(s): PROBNP in the last 8760 hours. HbA1C: No results for input(s): HGBA1C in the last 72 hours. CBG: No results for input(s): GLUCAP in the last 168 hours. Lipid Profile: No results for input(s): CHOL, HDL, LDLCALC, TRIG, CHOLHDL, LDLDIRECT in the last 72 hours. Thyroid Function Tests: No results for input(s): TSH, T4TOTAL, FREET4, T3FREE, THYROIDAB in the last 72 hours. Anemia Panel: No results for input(s): VITAMINB12, FOLATE, FERRITIN, TIBC, IRON, RETICCTPCT in the last 72 hours. Sepsis Labs: No results for input(s): PROCALCITON, LATICACIDVEN in the last 168 hours.  No results found for this or any previous visit (from the past 240 hours).       Radiology Studies: DG Chest Portable 1 View Result Date: 06/28/2024 CLINICAL DATA:  Fall.  Right hip fracture. EXAM: PORTABLE CHEST 1 VIEW COMPARISON:  February 16, 2023. FINDINGS: Stable cardiomediastinal silhouette. Both lungs are clear. The visualized skeletal structures are unremarkable. IMPRESSION: No active disease. Electronically Signed   By: Lynwood Landy Raddle M.D.   On: 06/28/2024 08:39   DG Hip Unilat With Pelvis 2-3 Views Right Result  Date: 06/28/2024 CLINICAL DATA:  Right hip pain after fall last night. EXAM: DG HIP (WITH OR WITHOUT PELVIS) 2-3V RIGHT COMPARISON:  April 30, 2023. FINDINGS: Severely displaced and comminuted intertrochanteric fracture of proximal right femur is noted. Status post surgical internal fixation of old proximal left femoral fracture. IMPRESSION: Severely displaced and comminuted intertrochanteric fracture of proximal right femur. Electronically Signed   By: Lynwood Landy Raddle M.D.   On: 06/28/2024 08:38   CT Cervical Spine Wo Contrast Result Date: 06/28/2024 CLINICAL DATA:  89 year old female status post trip and fall last night. Hip pain. EXAM: CT CERVICAL SPINE WITHOUT CONTRAST TECHNIQUE: Multidetector CT imaging of the cervical spine was performed without intravenous contrast. Multiplanar CT image reconstructions were also generated. RADIATION DOSE REDUCTION: This exam was performed according to the departmental dose-optimization program which includes automated exposure control, adjustment of the mA and/or kV according to patient size and/or use  of iterative reconstruction technique. COMPARISON:  Head CT today.  Cervical spine CT 01/15/2024. FINDINGS: Alignment: Stable cervical lordosis. Stable mild degenerative anterolisthesis of C7 on T1, with bilateral facet ankylosis. Bilateral posterior element alignment is within normal limits. Skull base and vertebrae: Chronic osteopenia. Visualized skull base is intact. No atlanto-occipital dissociation. C1 and C2 appear intact and aligned. No acute osseous abnormality identified. Soft tissues and spinal canal: No prevertebral fluid or swelling. No visible canal hematoma. Some negative visible noncontrast neck soft tissues aside from calcified carotid artery atherosclerosis. Disc levels: Mild for age cervical spine degeneration at many levels. Chronic degenerative facet ankylosis at C7-T1. Right greater than left facet hypertrophy in general. Advanced chronic disc and  endplate degeneration at C6-C7. Upper chest: Visible upper thoracic levels appear stable. Stable lung apices. IMPRESSION: 1. No acute traumatic injury identified in the cervical spine. 2. Chronic osteopenia and cervical spine degeneration. Electronically Signed   By: VEAR Hurst M.D.   On: 06/28/2024 08:25   CT Head Wo Contrast Result Date: 06/28/2024 CLINICAL DATA:  89 year old female status post trip and fall last night. Hip pain. EXAM: CT HEAD WITHOUT CONTRAST TECHNIQUE: Contiguous axial images were obtained from the base of the skull through the vertex without intravenous contrast. RADIATION DOSE REDUCTION: This exam was performed according to the departmental dose-optimization program which includes automated exposure control, adjustment of the mA and/or kV according to patient size and/or use of iterative reconstruction technique. COMPARISON:  Head CT 01/15/2024. FINDINGS: Brain: Stable cerebral volume. Disproportionate mesial and anterior temporal lobe atrophy (coronal image 39). No midline shift, ventriculomegaly, mass effect, evidence of mass lesion, intracranial hemorrhage or evidence of cortically based acute infarction. Patchy mild to moderate for age cerebral white matter hypodensity is stable. Vascular: From Calcified atherosclerosis at the skull base. No suspicious intracranial vascular hyperdensity. Skull: Stable and intact.  Asymmetric left TMJ degeneration. Sinuses/Orbits: Visualized paranasal sinuses and mastoids are stable and well aerated. Other: No acute orbit or scalp soft tissue injury identified. Calcified scalp vessel atherosclerosis. IMPRESSION: 1. No acute intracranial abnormality or acute traumatic injury identified. 2. Temporal lobe atrophy, Cerebral Atrophy (ICD10-G31.9). Chronic cerebral white matter changes. Electronically Signed   By: VEAR Hurst M.D.   On: 06/28/2024 08:23           LOS: 1 day   Time spent= 35 mins    Burgess JAYSON Dare, MD [facility]  If 7PM-7AM,  please contact night-coverage  06/29/2024, 12:36 PM

## 2024-06-29 NOTE — Anesthesia Postprocedure Evaluation (Signed)
 Anesthesia Post Note  Patient: Hargis SHAUNNA Macadam  Procedure(s) Performed: FIXATION, FRACTURE, INTERTROCHANTERIC, WITH INTRAMEDULLARY ROD, RIGHT (Right)     Patient location during evaluation: PACU Anesthesia Type: General Level of consciousness: awake and alert Pain management: pain level controlled Vital Signs Assessment: post-procedure vital signs reviewed and stable Respiratory status: spontaneous breathing, nonlabored ventilation, respiratory function stable and patient connected to nasal cannula oxygen  Cardiovascular status: blood pressure returned to baseline and stable Postop Assessment: no apparent nausea or vomiting Anesthetic complications: no   No notable events documented.  Last Vitals:  Vitals:   06/29/24 1845 06/29/24 1900  BP: (!) 158/75 (!) 144/84  Pulse: 82 85  Resp: 16 17  Temp:    SpO2: 100% 99%    Last Pain:  Vitals:   06/29/24 1830  TempSrc:   PainSc: 0-No pain                 Debby FORBES Like

## 2024-06-29 NOTE — Progress Notes (Signed)
 Initial Nutrition Assessment  DOCUMENTATION CODES:   Non-severe (moderate) malnutrition in context of chronic illness (dementia)  INTERVENTION:  After pt's surgery, recommend: Liberalized diet to regular to promote adequate PO intake that meets pt's increased calorie and protein needs Ensure Plus High Protein po BID, each supplement provides 350 kcal and 20 grams of protein. Continued vitamin d  supplementation daily Pt needs updated wt in chart  NUTRITION DIAGNOSIS:   Moderate Malnutrition related to chronic illness (dementia) as evidenced by moderate fat depletion, severe muscle depletion.  GOAL:   Patient will meet greater than or equal to 90% of their needs  MONITOR:   PO intake, Supplement acceptance  REASON FOR ASSESSMENT:   Consult Assessment of nutrition requirement/status, Hip fracture protocol  ASSESSMENT:   Pt with hx of dementia, frequent UTIs, frequent falls, HTN, hypokalemia, GERD, vitamin D  and vitamin B12 deficiency, and IDA. Hx of L hip fx s/p IM 02/2023. Admitted after fall at home with R hip fx.  Spoke with pt who was awake and alert at time of assessment. Pt not well oriented at the time but could answer some questions. Pt kept saying surgery was scheduled for tomorrow, but was actually going to surgery today. When asked about diet hx, pt reports she had been told she eats a lot but then reported she really does not eat much. Unable to obtain full diet hx at this time.   Pt reported she uses walker for mobility at baseline and that she did not have walker with her when she fell. Pt reported she lives with other people in the home, but unsure if that is accurate as other provider notes discuss pt living alone.  No recent wt for pt charted, but per chart review pt weighed 118# at last PCP visit in June. Pt unable to endorse any recent wt changes but nutrition focused physical exam shows moderate fat depletions and severe muscle depletions, suspect malnutrition in  the context of progressive dementia.   Discussed ordering Ensure supplements for after surgery and recommend a liberalized regular diet post op.  Medications reviewed and include:  Vitamin D3 1000 units daily Protonix  Potassium chloride  10mEq IVPB  Labs reviewed:  Potassium 2.8   NUTRITION - FOCUSED PHYSICAL EXAM:  Flowsheet Row Most Recent Value  Orbital Region Moderate depletion  Upper Arm Region Moderate depletion  Thoracic and Lumbar Region No depletion  Buccal Region Mild depletion  Temple Region Moderate depletion  Clavicle Bone Region Severe depletion  Clavicle and Acromion Bone Region Severe depletion  Scapular Bone Region Severe depletion  Dorsal Hand Severe depletion  Patellar Region Unable to assess  Anterior Thigh Region Unable to assess  Posterior Calf Region Unable to assess  Edema (RD Assessment) Mild  [BLE, L mild, R moderate]  Hair Reviewed  Eyes Reviewed  Mouth Reviewed  Skin Reviewed  Nails Reviewed    Diet Order:   Diet Order             Diet NPO time specified Except for: Sips with Meds, Ice Chips  Diet effective now                   EDUCATION NEEDS:   Not appropriate for education at this time  Skin:  Skin Assessment: Reviewed RN Assessment  Last BM:  8/13 type 6  Height:   Ht Readings from Last 1 Encounters:  01/15/24 4' 10 (1.473 m)    Weight:   Wt Readings from Last 1 Encounters:  01/15/24 49.9  kg    Ideal Body Weight:  43.9 kg  BMI:  There is no height or weight on file to calculate BMI.  Estimated Nutritional Needs:   Kcal:  1400-1600  Protein:  60-75g  Fluid:  1.4-1.6L    Josette Glance, MS, RDN, LDN Clinical Dietitian I Please reach out via secure chat

## 2024-06-29 NOTE — Op Note (Signed)
 DATE OF SURGERY:  06/29/2024  TIME: 4:54 PM  PATIENT NAME:  Wanda [name]  AGE: 89 y.o.  PRE-OPERATIVE DIAGNOSIS:  right hip fracture  POST-OPERATIVE DIAGNOSIS:  SAME  PROCEDURE:  FIXATION, FRACTURE, INTERTROCHANTERIC, WITH INTRAMEDULLARY ROD, RIGHT  SURGEON:  Cordella SQUIBB Rhein  ASSISTANT: none  OPERATIVE IMPLANTS:  [employer] Affixus 11x340 125 angle nail, 90mm lag screw, 36mm distal screw  * No implants in log *    ESTIMATED BLOOD LOSS: 100  PREOPERATIVE INDICATIONS:  Wanda [name] is a 89 y.o. year old who fell and suffered an right hip fracture. She was brought into the ER and then admitted and optimized and then elected for surgical intervention.    The risks benefits and alternatives were discussed with the patient including but not limited to the risks of nonoperative treatment, versus surgical intervention including infection, bleeding, nerve injury, malunion, nonunion, hardware prominence, hardware failure, need for hardware removal, blood clots, cardiopulmonary complications, morbidity, mortality, among others, and they were willing to proceed.    OPERATIVE PROCEDURE:  The patient was brought to the operating room and placed in the supine position. Anesthesia was administered. She was placed on the fracture table.  Closed reduction was performed under C-arm guidance.  Time out was then performed after sterile prep and drape. She received preoperative antibiotics.  Small incision proximal to the greater trochanter was made and carried down through skin and subcutaneous tissue.  Threaded guidewire was directed at the tip of the greater trochanter and advanced into the proximal metaphysis.  Positioning was confirmed with fluoroscopy.  I then used an entry reamer to enter the medullary canal.  I then passed a long guidewire down the length of the femur.  Position was again confirmed.  We measured and selected a 340 mm long nail.  We then used sequential reamers  starting with 8 and advancing up to 12.5.  A 340 mm, 11 diameter Affixus nail was selected.  The set at 125 blade angle.  Advanced this into the femur.I then used the targeting arm to make a percutaneous incision and directed a threaded guidewire up into the head/neck segment.  I confirmed adequate tip apex distance and measured the length.  I decided to place a 90 mm screw.  I then drilled the path for the compression screw and placed an antirotation bar.  I then placed the lag screw and then placed the compression screw.  We then localized the distal interlocking screw holes.  Using perfect circle technique, the static hole was identified.  The skin was incised with a 10 blade.  We drilled bicortically.  A 36 mm screw was then placed.  This had good bicortical purchase.  Final fluoroscopic images showed acceptable reduction of the fracture and alignment of the rod and screws.  The wounds were irrigated copiousl.  The gluteal fascia was closed with #1 Vicryl, and skin was closed with 3-0 Vicryl and staples.  Sterile dressing was applied with Aquacel dressings.  The patient was awakened and returned to PACU in stable and satisfactory condition. There were no complications and the patient tolerated the procedure well.   Post op recs: WB: WBAT right LE Abx: ancef  x23 hours post op Dressing: keep intact until follow up, change PRN if soiled or saturated. DVT prophylaxis: lovenox  starting POD1 x4 weeks Follow up: 2 weeks after surgery for a wound check with Dr. Edna at [facility].  Address: [address], [place], [place] [address]  Office  Phone: [phone]  Cordella Rhein, MD Orthopaedic Surgery

## 2024-06-29 NOTE — H&P (View-Only) (Signed)
 Reason for Consult:Right hip fx Referring Physician: Burgess Dare Time called: 0730 Time at bedside: 0857   Wanda [name] is an 89 y.o. female.  HPI: Wanda [name] slipped and fell in her kitchen. Wanda [name] had immediate right hip pain and could not get up. Wanda [name] was brought to [facility] where x-rays showed a hip fx. Orthopedics was not available so Wanda [name] was transferred to [facility] for definitive care. Wanda [name] lives at home alone and uses a RW to ambulate.  Past Medical History:  Diagnosis Date  . Anemia   . Cystocele   . Dementia (HCC)   . Diverticulosis 10/20/2010   Left-sided on colonoscopy by Dr. Shaaron 10/20/10  . Gastric polyps 10/02/2013  . Gastritis 12/08/2010   EGD by Dr. Shirlean hiatal hernia, duodenal diverticulum, chronic gastritis  . GERD (gastroesophageal reflux disease)   . Heart murmur   . Hiatal hernia 10/02/2013  . HTN (hypertension)   . IDA (iron deficiency anemia)   . 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 Osteoarthritis   . Radial fracture    (right) Undergoing treatment by Dr. Margrette currently  . Sleep apnea    STOP BANG SCORE 4  . Urge incontinence   . Vitamin D  deficiency     Past Surgical History:  Procedure Laterality Date  . ABDOMINAL HYSTERECTOMY    . ANTERIOR AND POSTERIOR REPAIR  05/17/2012   Procedure: ANTERIOR (CYSTOCELE) AND POSTERIOR REPAIR (RECTOCELE);  Surgeon: Norleen LULLA Server, MD;  Location: [facility];  Service: Gynecology;  Laterality: N/A;  . BIOPSY  11/22/2018   Procedure: BIOPSY;  Surgeon: Shaaron Lamar HERO, MD;  [facility];  Service: Endoscopy;;  . BIOPSY  02/12/2020   Procedure: BIOPSY;  Surgeon: Shaaron Lamar HERO, MD;  [facility]: [facility];  Service: Endoscopy;;  gastric biopsy  . CATARACT EXTRACTION, BILATERAL    . COLONOSCOPY  02/29/2012   Dr. Shaaron: colonic diverticulosis, minimal internal hemorrhoids, benign polyp  . ESOPHAGOGASTRODUODENOSCOPY  02/29/2012   Dr. Shaaron: atonic esophagus, moderate-sized hiatal hernia, fundal gland polyps  .  ESOPHAGOGASTRODUODENOSCOPY N/A 10/02/2013   Dr. Shaaron- multiple Ole evelene regal gastric polyps- not manipulated  . ESOPHAGOGASTRODUODENOSCOPY N/A 01/14/2015   Dr. Shaaron: Large hiatal hernia, otherwise normal EGD  . ESOPHAGOGASTRODUODENOSCOPY (EGD) WITH PROPOFOL  N/A 11/22/2018   Dr. Shaaron: normal esophagus, large hiatal hernia, erosive gastropathy s/p biopsy, normal duodenum. Reactive gastropathy  . ESOPHAGOGASTRODUODENOSCOPY (EGD) WITH PROPOFOL  N/A 02/12/2020   normal esophagus, large hiatal hernia, [name] lesions, benign gastric biopsy  . ESOPHAGOGASTRODUODENOSCOPY (EGD) WITH PROPOFOL  N/A 12/26/2020   Procedure: ESOPHAGOGASTRODUODENOSCOPY (EGD) WITH PROPOFOL ;  Surgeon: Shaaron Lamar HERO, MD;  [facility];  Service: Endoscopy;  Laterality: N/A;  . ESOPHAGOGASTRODUODENOSCOPY (EGD) WITH PROPOFOL  N/A 03/23/2021   Procedure: ESOPHAGOGASTRODUODENOSCOPY (EGD) WITH PROPOFOL ;  Surgeon: Eartha Angelia Sieving, MD;  [facility];  Service: Gastroenterology;  Laterality: N/A;  . GIVENS CAPSULE STUDY  02/29/2012   No explanation for IDA. Possible extrinsic compression but negative CT.   SABRA GIVENS CAPSULE STUDY N/A 12/26/2020   Procedure: GIVENS CAPSULE STUDY;  Surgeon: Shaaron Lamar HERO, MD;  [facility];  Service: Endoscopy;  Laterality: N/A;  . INSERTION OF MESH N/A 07/08/2021   Procedure: INSERTION OF MESH;  Surgeon: Rubin Calamity, MD;  Location: [facility] OR;  Service: General;  Laterality: N/A;  . INTRAMEDULLARY (IM) NAIL INTERTROCHANTERIC Left 02/18/2023   Procedure: LEFT INTRAMEDULLARY (IM) NAIL INTERTROCHANTERIC;  Surgeon: Onesimo Oneil LABOR, MD;  Location: [facility];  Service: Orthopedics;  Laterality: Left;  .  SALPINGOOPHORECTOMY  05/17/2012   Procedure: SALPINGO OOPHERECTOMY;  Surgeon: Norleen LULLA Server, MD;  Location: [facility];  Service: Gynecology;  Laterality: Bilateral;  . VAGINAL HYSTERECTOMY  05/17/2012   Procedure: HYSTERECTOMY VAGINAL;  Surgeon: Norleen LULLA Server, MD;  Location:  [facility];  Service: Gynecology;  Laterality: N/A;  . XI ROBOTIC ASSISTED HIATAL HERNIA REPAIR N/A 07/08/2021   Procedure: XI ROBOTIC ASSISTED HIATAL HERNIA REPAIR WITH MESH AND FUNDOPLICATION;  Surgeon: Rubin Calamity, MD;  Location: [facility] OR;  Service: General;  Laterality: N/A;    Family History  Problem Relation Age of Onset  . Hypertension Mother   . Lymphoma Brother   . Anesthesia problems Neg Hx   . Hypotension Neg Hx   . Malignant hyperthermia Neg Hx   . Pseudochol deficiency Neg Hx   . Colon cancer Neg Hx     Social History:  reports that Wanda [name] has never smoked. Wanda [name] has never used smokeless tobacco. Wanda [name] reports that Wanda [name] does not drink alcohol and does not use drugs.  Allergies:  Allergies  Allergen Reactions  . Sulfa Antibiotics     unknown  . Naproxen Sodium Rash and Other (See Comments)    Passed out    Medications: I have reviewed the patient's current medications.  Results for orders placed or performed during the hospital encounter of 06/28/24 (from the past 48 hours)  Basic metabolic panel     Status: Abnormal   Collection Time: 06/28/24  8:35 AM  Result Value Ref Range   Sodium 136 135 - 145 mmol/L   Potassium 2.7 (LL) 3.5 - 5.1 mmol/L    Comment: CRITICAL RESULT CALLED TO, READ BACK BY AND VERIFIED WITH  LONG,L AT  8:55AM ON 06/28/24 BY FESTERMAN,C   Chloride 94 (L) 98 - 111 mmol/L   CO2 24 22 - 32 mmol/L   Glucose, Bld 162 (H) 70 - 99 mg/dL    Comment: Glucose reference range applies only to samples taken after fasting for at least 8 hours.   BUN 19 8 - 23 mg/dL   Creatinine, Ser 9.36 0.44 - 1.00 mg/dL   Calcium  9.5 8.9 - 10.3 mg/dL   GFR, Estimated >39 >39 mL/min    Comment: (NOTE) Calculated using the CKD-EPI Creatinine Equation (2021)    Anion gap 18 (H) 5 - 15    Comment: Performed at [facility], [address], [place], [place] [address]  CBC with Differential     Status: Abnormal   Collection Time: 06/28/24  8:35 AM  Result Value Ref Range   WBC  18.0 (H) 4.0 - 10.5 K/uL   RBC 4.06 3.87 - 5.11 MIL/uL   Hemoglobin 12.8 12.0 - 15.0 g/dL   HCT 62.9 63.9 - 53.9 %   MCV 91.1 80.0 - 100.0 fL   MCH 31.5 26.0 - 34.0 pg   MCHC 34.6 30.0 - 36.0 g/dL   RDW 86.4 88.4 - 84.4 %   Platelets PLATELET CLUMPS NOTED ON SMEAR, UNABLE TO ESTIMATE 150 - 400 K/uL    Comment: REPEATED TO VERIFY SPECIMEN CHECKED FOR CLOTS    nRBC 0.0 0.0 - 0.2 %   Neutrophils Relative % 90 %   Neutro Abs 16.4 (H) 1.7 - 7.7 K/uL   Lymphocytes Relative 5 %   Lymphs Abs 0.8 0.7 - 4.0 K/uL   Monocytes Relative 4 %   Monocytes Absolute 0.6 0.1 - 1.0 K/uL   Eosinophils Relative 0 %   Eosinophils Absolute 0.0 0.0 - 0.5 K/uL  Basophils Relative 0 %   Basophils Absolute 0.0 0.0 - 0.1 K/uL   WBC Morphology MORPHOLOGY UNREMARKABLE    RBC Morphology MORPHOLOGY UNREMARKABLE    Smear Review See Note     Comment: PLATELET CLUMPS NOTED ON SMEAR, UNABLE TO ESTIMATE   Immature Granulocytes 1 %   Abs Immature Granulocytes 0.11 (H) 0.00 - 0.07 K/uL   Abs Granulocyte 16.4 (H) 1.5 - 6.5 K/uL    Comment: Performed at [facility], [address] [place], [place] [address]  CK     Status: Abnormal   Collection Time: 06/28/24  8:35 AM  Result Value Ref Range   Total CK 1,428 (H) 38 - 234 U/L    Comment: Performed at [facility], [address], [place], [place] [address]  Magnesium      Status: Abnormal   Collection Time: 06/28/24  8:35 AM  Result Value Ref Range   Magnesium  1.6 (L) 1.7 - 2.4 mg/dL    Comment: Performed at [facility], [address], [place], [place] [address]  Type and screen [facility]     Status: None   Collection Time: 06/28/24  8:45 AM  Result Value Ref Range   ABO/RH(D) O POS    Antibody Screen NEG    Sample Expiration      07/01/2024,2359 Performed at [facility], [address] [place], [place] [address]   Basic metabolic panel     Status: Abnormal   Collection Time: 06/29/24  5:31 AM  Result Value Ref Range   Sodium 137 135 - 145  mmol/L   Potassium 2.8 (L) 3.5 - 5.1 mmol/L   Chloride 101 98 - 111 mmol/L   CO2 26 22 - 32 mmol/L   Glucose, Bld 113 (H) 70 - 99 mg/dL    Comment: Glucose reference range applies only to samples taken after fasting for at least 8 hours.   BUN 10 8 - 23 mg/dL   Creatinine, Ser 9.35 0.44 - 1.00 mg/dL   Calcium  8.4 (L) 8.9 - 10.3 mg/dL   GFR, Estimated >39 >39 mL/min    Comment: (NOTE) Calculated using the CKD-EPI Creatinine Equation (2021)    Anion gap 10 5 - 15    Comment: Performed at [facility], [address] [place], [place] [address]  CBC     Status: Abnormal   Collection Time: 06/29/24  5:31 AM  Result Value Ref Range   WBC 15.8 (H) 4.0 - 10.5 K/uL   RBC 3.65 (L) 3.87 - 5.11 MIL/uL   Hemoglobin 11.5 (L) 12.0 - 15.0 g/dL   HCT 66.2 (L) 63.9 - 53.9 %   MCV 92.3 80.0 - 100.0 fL   MCH 31.5 26.0 - 34.0 pg   MCHC 34.1 30.0 - 36.0 g/dL   RDW 86.1 88.4 - 84.4 %   Platelets 266 150 - 400 K/uL   nRBC 0.0 0.0 - 0.2 %    Comment: Performed at [facility], [address], [place], [place] [address]  Magnesium      Status: None   Collection Time: 06/29/24  5:31 AM  Result Value Ref Range   Magnesium  2.4 1.7 - 2.4 mg/dL    Comment: Performed at [facility], [address] [place], [place] [address]  CK     Status: Abnormal   Collection Time: 06/29/24  5:31 AM  Result Value Ref Range   Total CK 514 (H) 38 - 234 U/L    Comment: Performed  at [facility], [address], [place], [place] [address]    DG Chest Portable 1 View Result Date: 06/28/2024 CLINICAL DATA:  Fall.  Right hip fracture. EXAM: PORTABLE CHEST 1 VIEW COMPARISON:  February 16, 2023. FINDINGS: Stable cardiomediastinal silhouette. Both lungs are clear. The visualized skeletal structures are unremarkable. IMPRESSION: No active disease. Electronically Signed   By: Lynwood Landy Raddle M.D.   On: 06/28/2024 08:39   DG Hip Unilat With Pelvis 2-3 Views Right Result Date: 06/28/2024 CLINICAL DATA:  Right  hip pain after fall last night. EXAM: DG HIP (WITH OR WITHOUT PELVIS) 2-3V RIGHT COMPARISON:  April 30, 2023. FINDINGS: Severely displaced and comminuted intertrochanteric fracture of proximal right femur is noted. Status post surgical internal fixation of old proximal left femoral fracture. IMPRESSION: Severely displaced and comminuted intertrochanteric fracture of proximal right femur. Electronically Signed   By: Lynwood Landy Raddle M.D.   On: 06/28/2024 08:38   CT Cervical Spine Wo Contrast Result Date: 06/28/2024 CLINICAL DATA:  89 year old female status post trip and fall last night. Hip pain. EXAM: CT CERVICAL SPINE WITHOUT CONTRAST TECHNIQUE: Multidetector CT imaging of the cervical spine was performed without intravenous contrast. Multiplanar CT image reconstructions were also generated. RADIATION DOSE REDUCTION: This exam was performed according to the departmental dose-optimization program which includes automated exposure control, adjustment of the mA and/or kV according to patient size and/or use of iterative reconstruction technique. COMPARISON:  Head CT today.  Cervical spine CT 01/15/2024. FINDINGS: Alignment: Stable cervical lordosis. Stable mild degenerative anterolisthesis of C7 on T1, with bilateral facet ankylosis. Bilateral posterior element alignment is within normal limits. Skull base and vertebrae: Chronic osteopenia. Visualized skull base is intact. No atlanto-occipital dissociation. C1 and C2 appear intact and aligned. No acute osseous abnormality identified. Soft tissues and spinal canal: No prevertebral fluid or swelling. No visible canal hematoma. Some negative visible noncontrast neck soft tissues aside from calcified carotid artery atherosclerosis. Disc levels: Mild for age cervical spine degeneration at many levels. Chronic degenerative facet ankylosis at C7-T1. Right greater than left facet hypertrophy in general. Advanced chronic disc and endplate degeneration at C6-C7. Upper chest:  Visible upper thoracic levels appear stable. Stable lung apices. IMPRESSION: 1. No acute traumatic injury identified in the cervical spine. 2. Chronic osteopenia and cervical spine degeneration. Electronically Signed   By: VEAR Hurst M.D.   On: 06/28/2024 08:25   CT Head Wo Contrast Result Date: 06/28/2024 CLINICAL DATA:  89 year old female status post trip and fall last night. Hip pain. EXAM: CT HEAD WITHOUT CONTRAST TECHNIQUE: Contiguous axial images were obtained from the base of the skull through the vertex without intravenous contrast. RADIATION DOSE REDUCTION: This exam was performed according to the departmental dose-optimization program which includes automated exposure control, adjustment of the mA and/or kV according to patient size and/or use of iterative reconstruction technique. COMPARISON:  Head CT 01/15/2024. FINDINGS: Brain: Stable cerebral volume. Disproportionate mesial and anterior temporal lobe atrophy (coronal image 39). No midline shift, ventriculomegaly, mass effect, evidence of mass lesion, intracranial hemorrhage or evidence of cortically based acute infarction. Patchy mild to moderate for age cerebral white matter hypodensity is stable. Vascular: From Calcified atherosclerosis at the skull base. No suspicious intracranial vascular hyperdensity. Skull: Stable and intact.  Asymmetric left TMJ degeneration. Sinuses/Orbits: Visualized paranasal sinuses and mastoids are stable and well aerated. Other: No acute orbit or scalp soft tissue injury identified. Calcified scalp vessel atherosclerosis. IMPRESSION: 1. No acute intracranial abnormality or acute traumatic injury identified.  2. Temporal lobe atrophy, Cerebral Atrophy (ICD10-G31.9). Chronic cerebral white matter changes. Electronically Signed   By: VEAR Hurst M.D.   On: 06/28/2024 08:23    Review of Systems  HENT:  Negative for ear discharge, ear pain, hearing loss and tinnitus.   Eyes:  Negative for photophobia and pain.  Respiratory:   Negative for cough and shortness of breath.   Cardiovascular:  Negative for chest pain.  Gastrointestinal:  Negative for abdominal pain, nausea and vomiting.  Genitourinary:  Negative for dysuria, flank pain, frequency and urgency.  Musculoskeletal:  Positive for arthralgias (Right hip). Negative for back pain, myalgias and neck pain.  Neurological:  Negative for dizziness and headaches.  Hematological:  Does not bruise/bleed easily.  Psychiatric/Behavioral:  The patient is not nervous/anxious.    Blood pressure (!) 156/75, pulse 81, temperature 98.1 F (36.7 C), temperature source Oral, resp. rate 18, SpO2 98%. Physical Exam Constitutional:      General: Wanda [name] is not in acute distress.    Appearance: Wanda [name] is well-developed. Wanda [name] is not diaphoretic.  HENT:     Head: Normocephalic and atraumatic.  Eyes:     General: No scleral icterus.       Right eye: No discharge.        Left eye: No discharge.     Conjunctiva/sclera: Conjunctivae normal.  Cardiovascular:     Rate and Rhythm: Normal rate and regular rhythm.  Pulmonary:     Effort: Pulmonary effort is normal. No respiratory distress.  Musculoskeletal:     Cervical back: Normal range of motion.     Comments: RLE No traumatic wounds, ecchymosis, or rash  Severe TTP hip  No knee or ankle effusion  Knee stable to varus/ valgus and anterior/posterior stress  Sens DPN, SPN, TN intact  Motor EHL, ext, flex, evers 5/5  DP 2+, PT 2+, No significant edema  Skin:    General: Skin is warm and dry.  Neurological:     Mental Status: Wanda [name] is alert.  Psychiatric:        Mood and Affect: Mood normal.        Behavior: Behavior normal.     Assessment/Plan: Right hip fx -- Plan IMN tomorrow with Dr. Reyne. Please keep NPO after MN. Multiple medical problems include Alzheimer's dementia, falling frequently at home, frequent UTIs, hypertension, B12 deficiency, hypokalemia from diuretics, GERD, gastritis, IDA, sleep apnea, urge incontinence and  vitamin D  deficiency -- per primary service. Will need K+ corrected before surgery    Ozell DOROTHA Ned, PA-C Orthopedic Surgery [phone] 06/29/2024, 9:18 AM

## 2024-06-29 NOTE — Consult Note (Signed)
 Reason for Consult:Right hip fx Referring Physician: Burgess Dare Time called: 0730 Time at bedside: 0857   Wanda [name] is an 89 y.o. female.  HPI: [name] slipped and fell in her kitchen. She had immediate right hip pain and could not get up. She was brought to [facility] where x-rays showed a hip fx. Orthopedics was not available so she was transferred to [facility] for definitive care. She lives at home alone and uses a RW to ambulate.  Past Medical History:  Diagnosis Date  . Anemia   . Cystocele   . Dementia (HCC)   . Diverticulosis 10/20/2010   Left-sided on colonoscopy by Dr. Shaaron 10/20/10  . Gastric polyps 10/02/2013  . Gastritis 12/08/2010   EGD by Dr. Shirlean hiatal hernia, duodenal diverticulum, chronic gastritis  . GERD (gastroesophageal reflux disease)   . Heart murmur   . Hiatal hernia 10/02/2013  . HTN (hypertension)   . IDA (iron deficiency anemia)   . 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 Osteoarthritis   . Radial fracture    (right) Undergoing treatment by Dr. Margrette currently  . Sleep apnea    STOP BANG SCORE 4  . Urge incontinence   . Vitamin D  deficiency     Past Surgical History:  Procedure Laterality Date  . ABDOMINAL HYSTERECTOMY    . ANTERIOR AND POSTERIOR REPAIR  05/17/2012   Procedure: ANTERIOR (CYSTOCELE) AND POSTERIOR REPAIR (RECTOCELE);  Surgeon: Norleen LULLA Server, MD;  Location: [facility];  Service: Gynecology;  Laterality: N/A;  . BIOPSY  11/22/2018   Procedure: BIOPSY;  Surgeon: Shaaron Lamar HERO, MD;  [facility]: [facility];  Service: Endoscopy;;  . BIOPSY  02/12/2020   Procedure: BIOPSY;  Surgeon: Shaaron Lamar HERO, MD;  [facility]: [facility];  Service: Endoscopy;;  gastric biopsy  . CATARACT EXTRACTION, BILATERAL    . COLONOSCOPY  02/29/2012   Dr. Shaaron: colonic diverticulosis, minimal internal hemorrhoids, benign polyp  . ESOPHAGOGASTRODUODENOSCOPY  02/29/2012   Dr. Shaaron: atonic esophagus, moderate-sized hiatal hernia, fundal gland polyps  .  ESOPHAGOGASTRODUODENOSCOPY N/A 10/02/2013   Dr. Shaaron- multiple Ole evelene regal gastric polyps- not manipulated  . ESOPHAGOGASTRODUODENOSCOPY N/A 01/14/2015   Dr. Shaaron: Large hiatal hernia, otherwise normal EGD  . ESOPHAGOGASTRODUODENOSCOPY (EGD) WITH PROPOFOL  N/A 11/22/2018   Dr. Shaaron: normal esophagus, large hiatal hernia, erosive gastropathy s/p biopsy, normal duodenum. Reactive gastropathy  . ESOPHAGOGASTRODUODENOSCOPY (EGD) WITH PROPOFOL  N/A 02/12/2020   normal esophagus, large hiatal hernia, [name] lesions, benign gastric biopsy  . ESOPHAGOGASTRODUODENOSCOPY (EGD) WITH PROPOFOL  N/A 12/26/2020   Procedure: ESOPHAGOGASTRODUODENOSCOPY (EGD) WITH PROPOFOL ;  Surgeon: Shaaron Lamar HERO, MD;  [facility];  Service: Endoscopy;  Laterality: N/A;  . ESOPHAGOGASTRODUODENOSCOPY (EGD) WITH PROPOFOL  N/A 03/23/2021   Procedure: ESOPHAGOGASTRODUODENOSCOPY (EGD) WITH PROPOFOL ;  Surgeon: Eartha Angelia Sieving, MD;  [facility]: [facility];  Service: Gastroenterology;  Laterality: N/A;  . GIVENS CAPSULE STUDY  02/29/2012   No explanation for IDA. Possible extrinsic compression but negative CT.   SABRA GIVENS CAPSULE STUDY N/A 12/26/2020   Procedure: GIVENS CAPSULE STUDY;  Surgeon: Shaaron Lamar HERO, MD;  [facility];  Service: Endoscopy;  Laterality: N/A;  . INSERTION OF MESH N/A 07/08/2021   Procedure: INSERTION OF MESH;  Surgeon: Rubin Calamity, MD;  Location: [facility] OR;  Service: General;  Laterality: N/A;  . INTRAMEDULLARY (IM) NAIL INTERTROCHANTERIC Left 02/18/2023   Procedure: LEFT INTRAMEDULLARY (IM) NAIL INTERTROCHANTERIC;  Surgeon: Onesimo Oneil LABOR, MD;  Location: [facility];  Service: Orthopedics;  Laterality: Left;  .  SALPINGOOPHORECTOMY  05/17/2012   Procedure: SALPINGO OOPHERECTOMY;  Surgeon: Norleen LULLA Server, MD;  Location: [facility];  Service: Gynecology;  Laterality: Bilateral;  . VAGINAL HYSTERECTOMY  05/17/2012   Procedure: HYSTERECTOMY VAGINAL;  Surgeon: Norleen LULLA Server, MD;  Location:  [facility];  Service: Gynecology;  Laterality: N/A;  . XI ROBOTIC ASSISTED HIATAL HERNIA REPAIR N/A 07/08/2021   Procedure: XI ROBOTIC ASSISTED HIATAL HERNIA REPAIR WITH MESH AND FUNDOPLICATION;  Surgeon: Rubin Calamity, MD;  Location: [facility] OR;  Service: General;  Laterality: N/A;    Family History  Problem Relation Age of Onset  . Hypertension Mother   . Lymphoma Brother   . Anesthesia problems Neg Hx   . Hypotension Neg Hx   . Malignant hyperthermia Neg Hx   . Pseudochol deficiency Neg Hx   . Colon cancer Neg Hx     Social History:  reports that she has never smoked. She has never used smokeless tobacco. She reports that she does not drink alcohol and does not use drugs.  Allergies:  Allergies  Allergen Reactions  . Sulfa Antibiotics     unknown  . Naproxen Sodium Rash and Other (See Comments)    Passed out    Medications: I have reviewed the patient's current medications.  Results for orders placed or performed during the hospital encounter of 06/28/24 (from the past 48 hours)  Basic metabolic panel     Status: Abnormal   Collection Time: 06/28/24  8:35 AM  Result Value Ref Range   Sodium 136 135 - 145 mmol/L   Potassium 2.7 (LL) 3.5 - 5.1 mmol/L    Comment: CRITICAL RESULT CALLED TO, READ BACK BY AND VERIFIED WITH  LONG,L AT  8:55AM ON 06/28/24 BY FESTERMAN,C   Chloride 94 (L) 98 - 111 mmol/L   CO2 24 22 - 32 mmol/L   Glucose, Bld 162 (H) 70 - 99 mg/dL    Comment: Glucose reference range applies only to samples taken after fasting for at least 8 hours.   BUN 19 8 - 23 mg/dL   Creatinine, Ser 9.36 0.44 - 1.00 mg/dL   Calcium  9.5 8.9 - 10.3 mg/dL   GFR, Estimated >39 >39 mL/min    Comment: (NOTE) Calculated using the CKD-EPI Creatinine Equation (2021)    Anion gap 18 (H) 5 - 15    Comment: Performed at [facility], [address], [place], [place] [address]  CBC with Differential     Status: Abnormal   Collection Time: 06/28/24  8:35 AM  Result Value Ref Range   WBC  18.0 (H) 4.0 - 10.5 K/uL   RBC 4.06 3.87 - 5.11 MIL/uL   Hemoglobin 12.8 12.0 - 15.0 g/dL   HCT 62.9 63.9 - 53.9 %   MCV 91.1 80.0 - 100.0 fL   MCH 31.5 26.0 - 34.0 pg   MCHC 34.6 30.0 - 36.0 g/dL   RDW 86.4 88.4 - 84.4 %   Platelets PLATELET CLUMPS NOTED ON SMEAR, UNABLE TO ESTIMATE 150 - 400 K/uL    Comment: REPEATED TO VERIFY SPECIMEN CHECKED FOR CLOTS    nRBC 0.0 0.0 - 0.2 %   Neutrophils Relative % 90 %   Neutro Abs 16.4 (H) 1.7 - 7.7 K/uL   Lymphocytes Relative 5 %   Lymphs Abs 0.8 0.7 - 4.0 K/uL   Monocytes Relative 4 %   Monocytes Absolute 0.6 0.1 - 1.0 K/uL   Eosinophils Relative 0 %   Eosinophils Absolute 0.0 0.0 - 0.5 K/uL  Basophils Relative 0 %   Basophils Absolute 0.0 0.0 - 0.1 K/uL   WBC Morphology MORPHOLOGY UNREMARKABLE    RBC Morphology MORPHOLOGY UNREMARKABLE    Smear Review See Note     Comment: PLATELET CLUMPS NOTED ON SMEAR, UNABLE TO ESTIMATE   Immature Granulocytes 1 %   Abs Immature Granulocytes 0.11 (H) 0.00 - 0.07 K/uL   Abs Granulocyte 16.4 (H) 1.5 - 6.5 K/uL    Comment: Performed at [facility], [address] [place], [place] [address]  CK     Status: Abnormal   Collection Time: 06/28/24  8:35 AM  Result Value Ref Range   Total CK 1,428 (H) 38 - 234 U/L    Comment: Performed at [facility], [address], [place], [place] [address]  Magnesium      Status: Abnormal   Collection Time: 06/28/24  8:35 AM  Result Value Ref Range   Magnesium  1.6 (L) 1.7 - 2.4 mg/dL    Comment: Performed at [facility], [address], [place], [place] [address]  Type and screen [facility]     Status: None   Collection Time: 06/28/24  8:45 AM  Result Value Ref Range   ABO/RH(D) O POS    Antibody Screen NEG    Sample Expiration      07/01/2024,2359 Performed at [facility], [address] [place], [place] [address]   Basic metabolic panel     Status: Abnormal   Collection Time: 06/29/24  5:31 AM  Result Value Ref Range   Sodium 137 135 - 145  mmol/L   Potassium 2.8 (L) 3.5 - 5.1 mmol/L   Chloride 101 98 - 111 mmol/L   CO2 26 22 - 32 mmol/L   Glucose, Bld 113 (H) 70 - 99 mg/dL    Comment: Glucose reference range applies only to samples taken after fasting for at least 8 hours.   BUN 10 8 - 23 mg/dL   Creatinine, Ser 9.35 0.44 - 1.00 mg/dL   Calcium  8.4 (L) 8.9 - 10.3 mg/dL   GFR, Estimated >39 >39 mL/min    Comment: (NOTE) Calculated using the CKD-EPI Creatinine Equation (2021)    Anion gap 10 5 - 15    Comment: Performed at [facility], [address] [place], [place] [address]  CBC     Status: Abnormal   Collection Time: 06/29/24  5:31 AM  Result Value Ref Range   WBC 15.8 (H) 4.0 - 10.5 K/uL   RBC 3.65 (L) 3.87 - 5.11 MIL/uL   Hemoglobin 11.5 (L) 12.0 - 15.0 g/dL   HCT 66.2 (L) 63.9 - 53.9 %   MCV 92.3 80.0 - 100.0 fL   MCH 31.5 26.0 - 34.0 pg   MCHC 34.1 30.0 - 36.0 g/dL   RDW 86.1 88.4 - 84.4 %   Platelets 266 150 - 400 K/uL   nRBC 0.0 0.0 - 0.2 %    Comment: Performed at [facility], [address], [place], [place] [address]  Magnesium      Status: None   Collection Time: 06/29/24  5:31 AM  Result Value Ref Range   Magnesium  2.4 1.7 - 2.4 mg/dL    Comment: Performed at [facility], [address] [place], [place] [address]  CK     Status: Abnormal   Collection Time: 06/29/24  5:31 AM  Result Value Ref Range   Total CK 514 (H) 38 - 234 U/L    Comment: Performed  at [facility], [address], [place], [place] [address]    DG Chest Portable 1 View Result Date: 06/28/2024 CLINICAL DATA:  Fall.  Right hip fracture. EXAM: PORTABLE CHEST 1 VIEW COMPARISON:  February 16, 2023. FINDINGS: Stable cardiomediastinal silhouette. Both lungs are clear. The visualized skeletal structures are unremarkable. IMPRESSION: No active disease. Electronically Signed   By: Lynwood Landy Raddle M.D.   On: 06/28/2024 08:39   DG Hip Unilat With Pelvis 2-3 Views Right Result Date: 06/28/2024 CLINICAL DATA:  Right  hip pain after fall last night. EXAM: DG HIP (WITH OR WITHOUT PELVIS) 2-3V RIGHT COMPARISON:  April 30, 2023. FINDINGS: Severely displaced and comminuted intertrochanteric fracture of proximal right femur is noted. Status post surgical internal fixation of old proximal left femoral fracture. IMPRESSION: Severely displaced and comminuted intertrochanteric fracture of proximal right femur. Electronically Signed   By: Lynwood Landy Raddle M.D.   On: 06/28/2024 08:38   CT Cervical Spine Wo Contrast Result Date: 06/28/2024 CLINICAL DATA:  89 year old female status post trip and fall last night. Hip pain. EXAM: CT CERVICAL SPINE WITHOUT CONTRAST TECHNIQUE: Multidetector CT imaging of the cervical spine was performed without intravenous contrast. Multiplanar CT image reconstructions were also generated. RADIATION DOSE REDUCTION: This exam was performed according to the departmental dose-optimization program which includes automated exposure control, adjustment of the mA and/or kV according to patient size and/or use of iterative reconstruction technique. COMPARISON:  Head CT today.  Cervical spine CT 01/15/2024. FINDINGS: Alignment: Stable cervical lordosis. Stable mild degenerative anterolisthesis of C7 on T1, with bilateral facet ankylosis. Bilateral posterior element alignment is within normal limits. Skull base and vertebrae: Chronic osteopenia. Visualized skull base is intact. No atlanto-occipital dissociation. C1 and C2 appear intact and aligned. No acute osseous abnormality identified. Soft tissues and spinal canal: No prevertebral fluid or swelling. No visible canal hematoma. Some negative visible noncontrast neck soft tissues aside from calcified carotid artery atherosclerosis. Disc levels: Mild for age cervical spine degeneration at many levels. Chronic degenerative facet ankylosis at C7-T1. Right greater than left facet hypertrophy in general. Advanced chronic disc and endplate degeneration at C6-C7. Upper chest:  Visible upper thoracic levels appear stable. Stable lung apices. IMPRESSION: 1. No acute traumatic injury identified in the cervical spine. 2. Chronic osteopenia and cervical spine degeneration. Electronically Signed   By: VEAR Hurst M.D.   On: 06/28/2024 08:25   CT Head Wo Contrast Result Date: 06/28/2024 CLINICAL DATA:  89 year old female status post trip and fall last night. Hip pain. EXAM: CT HEAD WITHOUT CONTRAST TECHNIQUE: Contiguous axial images were obtained from the base of the skull through the vertex without intravenous contrast. RADIATION DOSE REDUCTION: This exam was performed according to the departmental dose-optimization program which includes automated exposure control, adjustment of the mA and/or kV according to patient size and/or use of iterative reconstruction technique. COMPARISON:  Head CT 01/15/2024. FINDINGS: Brain: Stable cerebral volume. Disproportionate mesial and anterior temporal lobe atrophy (coronal image 39). No midline shift, ventriculomegaly, mass effect, evidence of mass lesion, intracranial hemorrhage or evidence of cortically based acute infarction. Patchy mild to moderate for age cerebral white matter hypodensity is stable. Vascular: From Calcified atherosclerosis at the skull base. No suspicious intracranial vascular hyperdensity. Skull: Stable and intact.  Asymmetric left TMJ degeneration. Sinuses/Orbits: Visualized paranasal sinuses and mastoids are stable and well aerated. Other: No acute orbit or scalp soft tissue injury identified. Calcified scalp vessel atherosclerosis. IMPRESSION: 1. No acute intracranial abnormality or acute traumatic injury identified.  2. Temporal lobe atrophy, Cerebral Atrophy (ICD10-G31.9). Chronic cerebral white matter changes. Electronically Signed   By: VEAR Hurst M.D.   On: 06/28/2024 08:23    Review of Systems  HENT:  Negative for ear discharge, ear pain, hearing loss and tinnitus.   Eyes:  Negative for photophobia and pain.  Respiratory:   Negative for cough and shortness of breath.   Cardiovascular:  Negative for chest pain.  Gastrointestinal:  Negative for abdominal pain, nausea and vomiting.  Genitourinary:  Negative for dysuria, flank pain, frequency and urgency.  Musculoskeletal:  Positive for arthralgias (Right hip). Negative for back pain, myalgias and neck pain.  Neurological:  Negative for dizziness and headaches.  Hematological:  Does not bruise/bleed easily.  Psychiatric/Behavioral:  The patient is not nervous/anxious.    Blood pressure (!) 156/75, pulse 81, temperature 98.1 F (36.7 C), temperature source Oral, resp. rate 18, SpO2 98%. Physical Exam Constitutional:      General: She is not in acute distress.    Appearance: She is well-developed. She is not diaphoretic.  HENT:     Head: Normocephalic and atraumatic.  Eyes:     General: No scleral icterus.       Right eye: No discharge.        Left eye: No discharge.     Conjunctiva/sclera: Conjunctivae normal.  Cardiovascular:     Rate and Rhythm: Normal rate and regular rhythm.  Pulmonary:     Effort: Pulmonary effort is normal. No respiratory distress.  Musculoskeletal:     Cervical back: Normal range of motion.     Comments: RLE No traumatic wounds, ecchymosis, or rash  Severe TTP hip  No knee or ankle effusion  Knee stable to varus/ valgus and anterior/posterior stress  Sens DPN, SPN, TN intact  Motor EHL, ext, flex, evers 5/5  DP 2+, PT 2+, No significant edema  Skin:    General: Skin is warm and dry.  Neurological:     Mental Status: She is alert.  Psychiatric:        Mood and Affect: Mood normal.        Behavior: Behavior normal.     Assessment/Plan: Right hip fx -- Plan IMN tomorrow with Dr. Reyne. Please keep NPO after MN. Multiple medical problems include Alzheimer's dementia, falling frequently at home, frequent UTIs, hypertension, B12 deficiency, hypokalemia from diuretics, GERD, gastritis, IDA, sleep apnea, urge incontinence and  vitamin D  deficiency -- per primary service. Will need K+ corrected before surgery    Ozell DOROTHA Ned, PA-C Orthopedic Surgery [phone] 06/29/2024, 9:18 AM

## 2024-06-29 NOTE — Transfer of Care (Signed)
 Immediate Anesthesia Transfer of Care Note  Patient: Wanda [name]  Procedure(s) Performed: FIXATION, FRACTURE, INTERTROCHANTERIC, WITH INTRAMEDULLARY ROD, RIGHT (Right)  Patient Location: PACU  Anesthesia Type:General  Level of Consciousness: awake  Airway & Oxygen  Therapy: Patient Spontanous Breathing  Post-op Assessment: Report given to RN and Post -op Vital signs reviewed and stable  Post vital signs: Reviewed and stable  Last Vitals:  Vitals Value Taken Time  BP 148/80 06/29/24 18:28  Temp    Pulse 81 06/29/24 18:29  Resp 15 06/29/24 18:29  SpO2 98 % 06/29/24 18:29  Vitals shown include unfiled device data.  Last Pain:  Vitals:   06/29/24 1454  TempSrc: Oral  PainSc:          Complications: No notable events documented.

## 2024-06-29 NOTE — Anesthesia Preprocedure Evaluation (Addendum)
 Anesthesia Evaluation  Patient identified by MRN, date of birth, ID band Patient awake    Reviewed: Allergy & Precautions, NPO status , Patient's Chart, lab work & pertinent test results  Airway Mallampati: II  TM Distance: >3 FB Neck ROM: Full    Dental no notable dental hx. (+)    Pulmonary sleep apnea (no CPAP) , Patient did not abstain from smoking.   Pulmonary exam normal breath sounds clear to auscultation       Cardiovascular hypertension, Pt. on medications Normal cardiovascular exam Rhythm:Regular Rate:Normal     Neuro/Psych  PSYCHIATRIC DISORDERS     Dementia negative neurological ROS     GI/Hepatic Neg liver ROS, hiatal hernia, PUD,GERD  ,,  Endo/Other  negative endocrine ROS    Renal/GU negative Renal ROS  negative genitourinary   Musculoskeletal  (+) Arthritis ,    Abdominal   Peds  Hematology negative hematology ROS (+)   Anesthesia Other Findings   Reproductive/Obstetrics                              Anesthesia Physical Anesthesia Plan  ASA: 3  Anesthesia Plan: General   Post-op Pain Management: Tylenol  PO (pre-op)*   Induction: Intravenous  PONV Risk Score and Plan: 3 and Dexamethasone , Ondansetron , Treatment may vary due to age or medical condition and TIVA  Airway Management Planned: Oral ETT  Additional Equipment:   Intra-op Plan:   Post-operative Plan: Extubation in OR  Informed Consent: I have reviewed the patients History and Physical, chart, labs and discussed the procedure including the risks, benefits and alternatives for the proposed anesthesia with the patient or authorized representative who has indicated his/her understanding and acceptance.     Dental advisory given  Plan Discussed with: CRNA  Anesthesia Plan Comments:          Anesthesia Quick Evaluation

## 2024-06-29 NOTE — Anesthesia Postprocedure Evaluation (Signed)
 Anesthesia Post Note  Patient: Wanda [name]  Procedure(s) Performed: AN AD HOC NERVE BLOCK     Patient location during evaluation: PACU Anesthesia Type: Regional Level of consciousness: awake and alert Pain management: pain level controlled Vital Signs Assessment: post-procedure vital signs reviewed and stable Respiratory status: spontaneous breathing Cardiovascular status: stable Anesthetic complications: no   No notable events documented.  Last Vitals:  Vitals:   06/29/24 1111 06/29/24 1130  BP: (!) 150/77 (!) 129/59  Pulse: 81 92  Resp: 16 18  Temp:    SpO2: 98% 99%    Last Pain:  Vitals:   06/29/24 0836  TempSrc:   PainSc: 3                  Norleen Pope

## 2024-06-29 NOTE — TOC CAGE-AID Note (Signed)
 Transition of Care [facility]) - CAGE-AID Screening  Patient Details  Name: Wanda [name] MRN: [id number] Date of Birth: [birth year]  Clinical Narrative:  Patient with history of Alzheimer's dementia, not fully oriented and unable to participate in substance abuse screening. Per previous notes patient has denied alcohol and drug use. No consult placed.  CAGE-AID Screening: Substance Abuse Screening unable to be completed due to: : Patient unable to participate

## 2024-06-30 ENCOUNTER — Encounter (HOSPITAL_COMMUNITY): Payer: Self-pay

## 2024-06-30 DIAGNOSIS — S72001G Fracture of unspecified part of neck of right femur, subsequent encounter for closed fracture with delayed healing: Secondary | ICD-10-CM

## 2024-06-30 DIAGNOSIS — E440 Moderate protein-calorie malnutrition: Secondary | ICD-10-CM | POA: Insufficient documentation

## 2024-06-30 LAB — CBC
HCT: 31.1 % — ABNORMAL LOW (ref 36.0–46.0)
Hemoglobin: 10.2 g/dL — ABNORMAL LOW (ref 12.0–15.0)
MCH: 30.8 pg (ref 26.0–34.0)
MCHC: 32.8 g/dL (ref 30.0–36.0)
MCV: 94 fL (ref 80.0–100.0)
Platelets: 255 K/uL (ref 150–400)
RBC: 3.31 MIL/uL — ABNORMAL LOW (ref 3.87–5.11)
RDW: 13.7 % (ref 11.5–15.5)
WBC: 15 K/uL — ABNORMAL HIGH (ref 4.0–10.5)
nRBC: 0 % (ref 0.0–0.2)

## 2024-06-30 LAB — BASIC METABOLIC PANEL WITH GFR
Anion gap: 11 (ref 5–15)
BUN: 13 mg/dL (ref 8–23)
CO2: 25 mmol/L (ref 22–32)
Calcium: 8.4 mg/dL — ABNORMAL LOW (ref 8.9–10.3)
Chloride: 103 mmol/L (ref 98–111)
Creatinine, Ser: 0.72 mg/dL (ref 0.44–1.00)
GFR, Estimated: >60 mL/min (ref >60)
Glucose, Bld: 135 mg/dL — ABNORMAL HIGH (ref 70–99)
Potassium: 4.4 mmol/L (ref 3.5–5.1)
Sodium: 139 mmol/L (ref 135–145)

## 2024-06-30 LAB — CK: Total CK: 213 U/L (ref 38–234)

## 2024-06-30 LAB — MAGNESIUM: Magnesium: 2 mg/dL (ref 1.7–2.4)

## 2024-06-30 LAB — PHOSPHORUS: Phosphorus: 3.4 mg/dL (ref 2.5–4.6)

## 2024-06-30 LAB — CALCIUM, IONIZED: Calcium, Ionized, Serum: 4.6 mg/dL (ref 4.5–5.6)

## 2024-06-30 NOTE — TOC Initial Note (Addendum)
 Transition of Care [facility]) - Initial/Assessment Note    Patient Details  Name: Wanda [name] MRN: [id number] Date of Birth: Feb 09, 1933  Transition of Care [facility]) CM/SW Contact:    Wanda Cordella Simmonds, LCSW Phone Number: 06/30/2024, 2:03 PM  Clinical Narrative:       Pt oriented x1, quite confused, asked her son Wanda [name] several times who he was.  All information from [place].  Pt from home alone, has had HH aides 6 days a week prior to this incident, but Wanda [name] reports the aides will not continue after DC due to issues.  Discussed PT recommendations for SNF and Wanda [name] is agreeable, requesting Wanda [name] in [place], reports pt was there last year after fracture of the other hip.  Permission given to send out referral in the hub.  Referral faxed to [facility].[phone].  Phone: [phone].  CSW reached out to [facility] to review.  C: [phone]           Medicare payer with inpt order 06/28/24.  Expected Discharge Plan: Skilled Nursing Facility Barriers to Discharge: Continued Medical Work up, SNF Pending bed offer   Patient Goals and CMS Choice     Choice offered to / list presented to : Adult Children (son Wanda [name])      Expected Discharge Plan and Services In-house Referral: Clinical Social Work Discharge Planning Services: CM Consult Post Acute Care Choice: Skilled Nursing Facility Living arrangements for the past 2 months: Single Family Home                                      Prior Living Arrangements/Services Living arrangements for the past 2 months: Single Family Home Lives with:: Self Patient language and need for interpreter reviewed:: Yes Do you feel safe going back to the place where you live?: Yes      Need for Family Participation in Patient Care: Yes (Comment) Care giver support system in place?: Yes (comment) Current home services: Homehealth aide (pt had HH aides 6 days per week) Criminal Activity/Legal Involvement Pertinent to Current  Situation/Hospitalization: No - Comment as needed  Activities of Daily Living      Permission Sought/Granted   Permission granted to share information with : Yes, Verbal Permission Granted              Emotional Assessment Appearance:: Appears stated age Attitude/Demeanor/Rapport: Engaged Affect (typically observed): Pleasant, Other (comment) (confused) Orientation: : Oriented to Self Alcohol / Substance Use: Not Applicable Psych Involvement: No (comment)  Admission diagnosis:  Closed right hip fracture (HCC) [S72.001A] Closed fracture of right hip, initial encounter (HCC) [S72.001A] Non-traumatic rhabdomyolysis [M62.82] Patient Active Problem List   Diagnosis Date Noted   Malnutrition of moderate degree 06/30/2024   Closed right hip fracture (HCC) 06/28/2024   History of fracture of left hip 06/28/2024   Traumatic rhabdomyolysis (HCC) 06/28/2024   Dehydration 06/28/2024   Dementia due to Alzheimer's disease (HCC) 04/23/2023   Closed left hip fracture (HCC) 02/16/2023   UTI (urinary tract infection) 02/16/2023   Hypomagnesemia 02/16/2023   S/P repair of paraesophageal hernia 07/08/2021   UGIB (upper gastrointestinal bleed) 03/23/2021   Upper GI bleed 03/22/2021   Acute GI bleeding 12/26/2020   Acute upper GI bleed 02/11/2020   Loss of weight 06/28/2019   Sleep apnea 11/20/2018   Hypokalemia 11/20/2018   Leukocytosis 11/20/2018   Symptomatic anemia 11/04/2018   Diarrhea, functional 03/15/2015  Fatigue 12/28/2014   Upper abdominal pain 12/28/2014   B12 deficiency 12/28/2014    Class: History of   Acute blood loss anemia 10/02/2013   GI bleed 10/02/2013   Melena 10/02/2013   Hematemesis 10/02/2013   Sinus tachycardia 10/02/2013   Multiple gastric erosions 10/02/2013   Hiatal hernia 10/02/2013   Gastric polyps 10/02/2013   HTN (hypertension)    IDA (iron deficiency anemia)    Adhesive capsulitis of right shoulder 03/01/2012   Iron deficiency anemia  02/02/2012   Distal radius fracture 11/30/2011   Diverticulosis 10/20/2010   Normocytic anemia 10/01/2010   GERD 10/01/2010   FECAL OCCULT BLOOD 10/01/2010   PCP:  Wanda Morna FALCON, NP Pharmacy:   [facility] - [place], [place] - [address] [place] [address] Phone: [phone] Fax: [phone]     Social Drivers of Health (SDOH) Social History: SDOH Screenings   Food Insecurity: No Food Insecurity (06/29/2024)  Housing: Low Risk  (06/29/2024)  Transportation Needs: No Transportation Needs (06/29/2024)  Utilities: Not At Risk (06/29/2024)  Social Connections: Unknown (06/29/2024)  Tobacco Use: Low Risk  (06/29/2024)   SDOH Interventions:     Readmission Risk Interventions     No data to display

## 2024-06-30 NOTE — Evaluation (Signed)
 Occupational Therapy Evaluation Patient Details Name: Wanda [name] MRN: [id number] DOB: 29-Jan-1933 Today's Date: 06/30/2024   History of Present Illness   Pt is an 89 yo female who presented to [facility] on 06/28/24 secondary to a mechanical fall at home with subsequent displaced R hip fx. Pt s/p IM rod R femur on 8/14. PMH: Alzheimer's dementia, hx of falls, frequent UTIs, HTN, B12 deficiency, hypokalemia, GERD, gastritis, IDA, sleep apnea, vitamin D  deficiency, hx L hip fx     Clinical Impressions At baseline, pt is Independent to Mod I with ADLs and light meal prep using the microwave, uses a walker for functional mobility, and receives assistance from family for IADLs, including medication management. Pt with cognitive deficits at baseline. Pt also now presents with decreased activity tolerance, decreased balance, B UE generalized weakness, pain affecting functional level, and decreased safety and independence with functional tasks. Pt currently demonstrates ability to largely complete UB ADLs with Mod I to Min assist, LB ADLs with Max to Total assist +2, and functional transfers with a RW with Min assist +2. Pt pleasantly confused throughout session and participating well; however, pt was limited by R LE pain. VSS on RA. Pt will benefit from acute skilled OT services to address deficits outlined below and to increase safety and independence with functional tasks. Post acute discharge, pt will benefit from intensive inpatient skilled rehab services < 3 hours per day to maximize rehab potential.       If plan is discharge home, recommend the following:   Two people to help with walking and/or transfers;Two people to help with bathing/dressing/bathroom;Assistance with cooking/housework;Assistance with feeding;Direct supervision/assist for medications management;Direct supervision/assist for financial management;Assist for transportation;Help with stairs or ramp for entrance;Supervision due to  cognitive status (initiation cues for self-feeding)     Functional Status Assessment   Patient has had a recent decline in their functional status and demonstrates the ability to make significant improvements in function in a reasonable and predictable amount of time.     Equipment Recommendations   None recommended by OT (Pt already has needed equipment)     Recommendations for Other Services         Precautions/Restrictions   Precautions Precautions: Fall Restrictions Weight Bearing Restrictions Per Provider Order: Yes RLE Weight Bearing Per Provider Order: Weight bearing as tolerated     Mobility Bed Mobility Overal bed mobility: Needs Assistance Bed Mobility: Supine to Sit     Supine to sit: Mod assist, Max assist, +2 for physical assistance, HOB elevated     General bed mobility comments: Pt internally distracted during bed mobility affecting pt participation    Transfers Overall transfer level: Needs assistance Equipment used: Rolling walker (2 wheels) Transfers: Sit to/from Stand, Bed to chair/wheelchair/BSC Sit to Stand: Min assist, +2 physical assistance, +2 safety/equipment     Step pivot transfers: Min assist, +2 physical assistance, +2 safety/equipment     General transfer comment: cue for hand placement/technique with RW, safety, and sequencing      Balance Overall balance assessment: Needs assistance, History of Falls Sitting-balance support: Single extremity supported, Bilateral upper extremity supported, Feet supported, No upper extremity supported Sitting balance-Leahy Scale: Fair Sitting balance - Comments: sitting EOB; initally requiring Mod assist for sitting balance due to posterior lean (suspect due to pain) and quickly fading to Supervision   Standing balance support: Bilateral upper extremity supported, During functional activity, Reliant on assistive device for balance Standing balance-Leahy Scale: Poor Standing balance comment:  reliant on B  UE support and support of therapists                           ADL either performed or assessed with clinical judgement   ADL Overall ADL's : Needs assistance/impaired Eating/Feeding: Sitting;Modified independent;Set up (cues for initation) Eating/Feeding Details (indicate cue type and reason): Mod I after cues for initiation Grooming: Set up;Sitting (cues for initiation)   Upper Body Bathing: Contact guard assist;Minimal assistance;Sitting (cues for initiation)   Lower Body Bathing: Maximal assistance;+2 for safety/equipment;+2 for physical assistance;Sit to/from stand;Cueing for compensatory techniques;Cueing for sequencing (cues for initiation)   Upper Body Dressing : Contact guard assist;Sitting (cues for initiation)   Lower Body Dressing: Maximal assistance;+2 for physical assistance;+2 for safety/equipment;Sit to/from stand;Cueing for compensatory techniques (cues for initiation)   Toilet Transfer: Minimal assistance;+2 for physical assistance;+2 for safety/equipment;Rolling walker (2 wheels);BSC/3in1;Cueing for sequencing;Cueing for safety (step-pivot; cues for initiation) Toilet Transfer Details (indicate cue type and reason): simulated from bed to recliner Toileting- Clothing Manipulation and Hygiene: Total assistance;+2 for physical assistance;+2 for safety/equipment;Sit to/from stand       Functional mobility during ADLs:  (unable to progress this session due to pain) General ADL Comments: Pt with decreased activity tolerance, pain, and cognitive level affecting functional level     Vision   Vision Assessment?: No apparent visual deficits Additional Comments: Vision [facility] for tasks assessed; not formally screened or assessed     Perception         Praxis         Pertinent Vitals/Pain Pain Assessment Pain Assessment: Faces Faces Pain Scale: Hurts even more Pain Location: R LE with movement Pain Descriptors / Indicators: Discomfort,  Grimacing, Guarding, Aching Pain Intervention(s): Limited activity within patient's tolerance, Monitored during session, Repositioned, RN gave pain meds during session     Extremity/Trunk Assessment Upper Extremity Assessment Upper Extremity Assessment: Left hand dominant;Generalized weakness;RUE deficits/detail;LUE deficits/detail RUE Deficits / Details: decreased shoulder ROM at baseline per pt; generalized weakness; ROM and coordination WFL RUE Sensation: WNL RUE Coordination: WNL LUE Deficits / Details: decreased shoulder ROM at baseline per pt; generalized weakness; ROM and coordination WFL LUE Sensation: WNL LUE Coordination: WNL   Lower Extremity Assessment Lower Extremity Assessment: Defer to PT evaluation   Cervical / Trunk Assessment Cervical / Trunk Assessment: Kyphotic   Communication Communication Communication: No apparent difficulties   Cognition Arousal: Alert Behavior During Therapy: WFL for tasks assessed/performed Cognition: History of cognitive impairments, Cognition impaired, No family/caregiver present to determine baseline   Orientation impairments: Time (Pt able to state she is in the hospital, but not certain which one. Pt also able to state year, but unable to state month.) Awareness: Intellectual awareness intact, Online awareness impaired Memory impairment (select all impairments): Short-term memory, Working [employer] fast streamer, Conservation officer, historic buildings Attention impairment (select first level of impairment): Sustained attention, Selective attention Executive functioning impairment (select all impairments): Organization, Reasoning, Problem solving, Sequencing, Initiation (able to sequence familiar tasks; difficulty sequencing novel tasks) OT - Cognition Comments: Pt with hx of dementia per chart review. Pt pleasantly confused throughout session. Pt is easily internally distracted. No family present to confirm baseline cognition, but suspect pt is at or near  baseline.                 Following commands: Impaired Following commands impaired: Follows one step commands inconsistently, Follows one step commands with increased time     Cueing  General Comments   Cueing Techniques:  Verbal cues;Gestural cues;Tactile cues;Visual cues  VSS on RA. RN present during a portion of session.   Exercises     Shoulder Instructions      Home Living Family/patient expects to be discharged to:: Private residence Living Arrangements: Alone Available Help at Discharge: Family;Available PRN/intermittently (3 sons and 1 DIL live very close by; pt also reports her sons frequently ask people to stay with her during the day) Type of Home: House Home Access: Stairs to enter [employer] of Steps: 2 Entrance Stairs-Rails: Right;Left;Can reach both Home Layout: One level     Bathroom Shower/Tub: Tub/shower unit;Sponge bathes at baseline   [employer]: Standard     Home Equipment: Agricultural consultant (2 wheels);Rollator (4 wheels);BSC/3in1   Additional Comments: Fall leading to this admission was witnessed by son through Ring camera in pt's home.      Prior Functioning/Environment Prior Level of Function : History of Falls (last six months);Independent/Modified Independent;Needs assist             Mobility Comments: Pt reports use of a walker; Pt reports 3 falls ADLs Comments: Pt reports she is typically Ind to Mod I with ADLs and light meal prep using the microwave. Pt reports her family has unplugged her stove/oven for safety. Pt's family assists with IADLs including transportation and medication management.    OT Problem List: Decreased strength;Decreased activity tolerance;Impaired balance (sitting and/or standing);Decreased safety awareness;Decreased cognition;Decreased knowledge of use of DME or AE;Decreased knowledge of precautions;Pain   OT Treatment/Interventions: Self-care/ADL training;Therapeutic exercise;DME and/or AE  instruction;Therapeutic activities;Cognitive remediation/compensation;Patient/family education;Balance training      OT Goals(Current goals can be found in the care plan section)   Acute Rehab OT Goals Patient Stated Goal: to return home and conitnue to live alone OT Goal Formulation: Patient unable to participate in goal setting Time For Goal Achievement: 07/14/24 Potential to Achieve Goals: Good ADL Goals Pt Will Perform Upper Body Bathing: with supervision;sitting Pt Will Perform Lower Body Bathing: with min assist;sitting/lateral leans;sit to/from stand Pt Will Perform Lower Body Dressing: with min assist;sitting/lateral leans;sit to/from stand Pt Will Transfer to Toilet: with contact guard assist;ambulating;regular height toilet (with least restrictive AD) Pt Will Perform Toileting - Clothing Manipulation and hygiene: with min assist;sit to/from stand;sitting/lateral leans   OT Frequency:  Min 2X/week    Co-evaluation PT/OT/SLP Co-Evaluation/Treatment: Yes Reason for Co-Treatment: For patient/therapist safety;Necessary to address cognition/behavior during functional activity;To address functional/ADL transfers   OT goals addressed during session: ADL's and self-care;Other (comment) (Cognition)      AM-PAC OT 6 Clicks Daily Activity     Outcome Measure Help from another person eating meals?: A Little Help from another person taking care of personal grooming?: A Little Help from another person toileting, which includes using toliet, bedpan, or urinal?: Total Help from another person bathing (including washing, rinsing, drying)?: A Lot Help from another person to put on and taking off regular upper body clothing?: A Little Help from another person to put on and taking off regular lower body clothing?: A Lot 6 Click Score: 14   End of Session Equipment Utilized During Treatment: Gait belt;Rolling walker (2 wheels) Nurse Communication: Mobility status  Activity Tolerance:  Patient tolerated treatment well;Patient limited by pain Patient left: in chair;with call bell/phone within reach;with chair alarm set  OT Visit Diagnosis: Unsteadiness on feet (R26.81);Other abnormalities of gait and mobility (R26.89);Repeated falls (R29.6);Muscle weakness (generalized) (M62.81);History of falling (Z91.81);Pain;Other symptoms and signs involving cognitive function  Time: 8987-8953 OT Time Calculation (min): 34 min Charges:  OT General Charges $OT Visit: 1 Visit OT Evaluation $OT Eval Moderate Complexity: 1 Mod  Margarie Rockey HERO., OTR/L, MA Acute Rehab [phone]   Margarie FORBES Horns 06/30/2024, 11:18 AM

## 2024-06-30 NOTE — Progress Notes (Signed)
  Progress Note   Patient: Wanda [name] FMW:[id number] DOB: February 12, 1933 DOA: 06/28/2024     2 DOS: the patient was seen and examined on 06/30/2024   Brief hospital course: Post Op Day 1 s/p IM rod right femur  Assessment and Plan: POD 1 s/p IM rod for right femur fracture H/H is stable, wbc elevated but unchanged from pre-op Can WBAT, will mobilize with PT Lovenonox for DVT prophylaxis Continue current pain regimen  Change dressing on POD 7 Dispo to rehab when clear PT and stable from medicine Follow up with me in 2 weeks        Subjective: Patient is sleep and difficult to arouse  Physical Exam: Vitals:   06/29/24 2008 06/30/24 0014 06/30/24 0449 06/30/24 0803  BP: 127/74 132/82 (!) 145/76 113/79  Pulse: 92 76 70 69  Resp: 18 15 16 16   Temp: 97.8 F (36.6 C) (!) 97.5 F (36.4 C) 97.6 F (36.4 C) 97.7 F (36.5 C)  TempSrc: Oral Oral Oral Oral  SpO2: 99% 100% 100% 100%   Well appearing woman, resting in bed.  Asleep and difficult to arouse.  Dressing are C/D/I Thigh is soft and compressible Grossly moves toes Legs equal in length  Data Reviewed: K 4.4 Wbc 15 H/h 10.2/31.1    Disposition: Status is: Inpatient     Author: Cordella SHAUNNA Rhein, MD 06/30/2024 8:57 AM  For on call review [email or web address].

## 2024-06-30 NOTE — Evaluation (Signed)
 Physical Therapy Evaluation  Patient Details Name: Wanda [name] MRN: [id number] DOB: Jan 04, 1933 Today's Date: 06/30/2024  History of Present Illness  Pt is an 89 yo female who presented to [facility] on 06/28/24 secondary to a mechanical fall at home with subsequent displaced R hip fx. Pt s/p IM rod R femur on 8/14. PMH: Alzheimer's dementia, hx of falls, frequent UTIs, HTN, B12 deficiency, hypokalemia, GERD, gastritis, IDA, sleep apnea, vitamin D  deficiency, hx L hip fx   Clinical Impression  Pt admitted with above diagnosis. Pt currently with functional limitations due to the deficits listed below (see PT Problem List). At the time of PT eval pt was able to perform transfers with up to +2 assist for balance support and safety. Pt eager to participate with therapies but voices pain when OOB mobility was initiated. RN present ans provided pain meds at start of session. Recommend skilled PT follow up <3 hours/day at d/c to maximize functional independence, safety, and return to PLOF. Pt will benefit from acute skilled PT to increase their independence and safety with mobility to allow discharge.           If plan is discharge home, recommend the following: A lot of help with walking and/or transfers;A little help with bathing/dressing/bathroom;Assistance with cooking/housework;Assist for transportation;Help with stairs or ramp for entrance;Supervision due to cognitive status   Can travel by private vehicle   Yes    Equipment Recommendations None recommended by PT  Recommendations for Other Services       Functional Status Assessment Patient has had a recent decline in their functional status and demonstrates the ability to make significant improvements in function in a reasonable and predictable amount of time.     Precautions / Restrictions Precautions Precautions: Fall Recall of Precautions/Restrictions: Intact Restrictions Weight Bearing Restrictions Per Provider Order: Yes RLE Weight  Bearing Per Provider Order: Weight bearing as tolerated      Mobility  Bed Mobility Overal bed mobility: Needs Assistance Bed Mobility: Supine to Sit     Supine to sit: Mod assist, Max assist, +2 for physical assistance, HOB elevated     General bed mobility comments: Pt internally distracted during bed mobility affecting pt participation, however anticipate pt would have been able to engage in transition to EOB if cognition allowed.    Transfers Overall transfer level: Needs assistance Equipment used: Rolling walker (2 wheels) Transfers: Sit to/from Stand, Bed to chair/wheelchair/BSC Sit to Stand: Min assist, +2 physical assistance, +2 safety/equipment   Step pivot transfers: Min assist, +2 physical assistance, +2 safety/equipment       General transfer comment: Multimodal cues for stepping towards recliner. Pt voicing pain in RLE with weight bearing but was able to utilize the RW well to off weight.    Ambulation/Gait               General Gait Details: Unable to progress to gait training at this time.  Stairs            Wheelchair Mobility     Tilt Bed    Modified Rankin (Stroke Patients Only)       Balance Overall balance assessment: Needs assistance, History of Falls Sitting-balance support: Single extremity supported, Bilateral upper extremity supported, Feet supported, No upper extremity supported Sitting balance-Leahy Scale: Fair Sitting balance - Comments: sitting EOB; initally requiring Mod assist for sitting balance due to posterior lean (suspect due to pain) and quickly fading to Supervision   Standing balance support: Bilateral upper extremity supported,  During functional activity, Reliant on assistive device for balance Standing balance-Leahy Scale: Poor Standing balance comment: reliant on B UE support and support of therapists                             Pertinent Vitals/Pain Pain Assessment Pain Assessment: Faces Faces  Pain Scale: Hurts even more Pain Location: R LE with movement Pain Descriptors / Indicators: Discomfort, Grimacing, Guarding, Aching Pain Intervention(s): Limited activity within patient's tolerance, Monitored during session, Repositioned    Home Living Family/patient expects to be discharged to:: Private residence Living Arrangements: Alone Available Help at Discharge: Family;Available PRN/intermittently (3 sons and 1 DIL live very close by; pt also reports her sons frequently ask people to stay with her during the day) Type of Home: House Home Access: Stairs to enter Entrance Stairs-Rails: Right;Left;Can reach both Entrance Stairs-Number of Steps: 2   Home Layout: One level Home Equipment: Agricultural consultant (2 wheels);Rollator (4 wheels);BSC/3in1 Additional Comments: Fall leading to this admission was witnessed by son through Ring camera in pt's home.    Prior Function Prior Level of Function : History of Falls (last six months);Independent/Modified Independent;Needs assist             Mobility Comments: Pt reports use of a walker; Pt reports 3 falls ADLs Comments: Pt reports she is typically Ind to Mod I with ADLs and light meal prep using the microwave. Pt reports her family has unplugged her stove/oven for safety. Pt's family assists with IADLs including transportation and medication management.     Extremity/Trunk Assessment   Upper Extremity Assessment Upper Extremity Assessment: Left hand dominant RUE Deficits / Details: decreased shoulder ROM at baseline per pt; generalized weakness; ROM and coordination WFL RUE Sensation: WNL RUE Coordination: WNL LUE Deficits / Details: decreased shoulder ROM at baseline per pt; generalized weakness; ROM and coordination WFL LUE Sensation: WNL LUE Coordination: WNL    Lower Extremity Assessment Lower Extremity Assessment: RLE deficits/detail RLE Deficits / Details: Acute pain, decreased strength and AROM consistent with above  mentioned surgery RLE: Unable to fully assess due to pain    Cervical / Trunk Assessment Cervical / Trunk Assessment: Kyphotic  Communication   Communication Communication: No apparent difficulties    Cognition Arousal: Alert Behavior During Therapy: WFL for tasks assessed/performed   PT - Cognitive impairments: History of cognitive impairments                         Following commands: Impaired Following commands impaired: Follows one step commands inconsistently, Follows one step commands with increased time     Cueing Cueing Techniques: Verbal cues, Gestural cues, Tactile cues, Visual cues     General Comments General comments (skin integrity, edema, etc.): VSS on RA. Pt left on RA at end of session and RN notified.    Exercises General Exercises - Lower Extremity Ankle Circles/Pumps: 10 reps, Both Long Arc Quad: 5 reps   Assessment/Plan    PT Assessment Patient needs continued PT services  PT Problem List Decreased strength;Decreased activity tolerance;Decreased balance;Decreased mobility;Decreased knowledge of use of DME;Decreased safety awareness;Decreased knowledge of precautions;Pain       PT Treatment Interventions DME instruction;Gait training;Functional mobility training;Therapeutic activities;Therapeutic exercise;Balance training;Patient/family education    PT Goals (Current goals can be found in the Care Plan section)  Acute Rehab PT Goals Patient Stated Goal: Be able to go back to her home. PT Goal Formulation: With  patient Time For Goal Achievement: 07/14/24 Potential to Achieve Goals: Good    Frequency Min 2X/week     Co-evaluation PT/OT/SLP Co-Evaluation/Treatment: Yes Reason for Co-Treatment: For patient/therapist safety;Necessary to address cognition/behavior during functional activity;To address functional/ADL transfers PT goals addressed during session: Mobility/safety with mobility;Balance;Proper use of DME;Strengthening/ROM OT  goals addressed during session: ADL's and self-care;Other (comment) (Cognition)       AM-PAC PT 6 Clicks Mobility  Outcome Measure Help needed turning from your back to your side while in a flat bed without using bedrails?: A Lot Help needed moving from lying on your back to sitting on the side of a flat bed without using bedrails?: A Lot Help needed moving to and from a bed to a chair (including a wheelchair)?: A Lot Help needed standing up from a chair using your arms (e.g., wheelchair or bedside chair)?: A Lot Help needed to walk in hospital room?: Total Help needed climbing 3-5 steps with a railing? : Total 6 Click Score: 10    End of Session Equipment Utilized During Treatment: Gait belt Activity Tolerance: Patient tolerated treatment well Patient left: in chair;with call bell/phone within reach;with chair alarm set Nurse Communication: Mobility status (O2 status - per RN left on RA) PT Visit Diagnosis: Unsteadiness on feet (R26.81);Pain Pain - Right/Left: Right Pain - part of body: Hip;Knee    Time: 1011-1045 PT Time Calculation (min) (ACUTE ONLY): 34 min   Charges:   PT Evaluation $PT Eval Moderate Complexity: 1 Mod   PT General Charges $$ ACUTE PT VISIT: 1 Visit         Leita Sable, PT, DPT Acute Rehabilitation Services Secure Chat Preferred Office: [phone]   Leita JONETTA Sable 06/30/2024, 1:02 PM

## 2024-06-30 NOTE — NC FL2 (Signed)
 [place]  MEDICAID FL2 LEVEL OF CARE FORM     IDENTIFICATION  Patient Name: Wanda [name] Birthdate: [birth year] Sex: female Admission Date (Current Location): 06/28/2024  [facility] and [place] Number:  Producer, television/film/video and Address:  [facility] [place], [address] [place], [place] [address]      Provider Number: [id number]  Attending Physician Name and Address:  Caleen Burgess BROCKS, MD  Relative Name and Phone Number:  Trixie, [place]la[place] [phone]  [phone]    Current Level of Care: Hospital Recommended Level of Care: Skilled Nursing Facility Prior Approval Number:    Date Approved/Denied:   PASRR Number:    Discharge Plan: SNF    Current Diagnoses: Patient Active Problem List   Diagnosis Date Noted   Malnutrition of moderate degree 06/30/2024   Closed right hip fracture (HCC) 06/28/2024   History of fracture of left hip 06/28/2024   Traumatic rhabdomyolysis (HCC) 06/28/2024   Dehydration 06/28/2024   Dementia due to Alzheimer's disease (HCC) 04/23/2023   Closed left hip fracture (HCC) 02/16/2023   UTI (urinary tract infection) 02/16/2023   Hypomagnesemia 02/16/2023   S/P repair of paraesophageal hernia 07/08/2021   UGIB (upper gastrointestinal bleed) 03/23/2021   Upper GI bleed 03/22/2021   Acute GI bleeding 12/26/2020   Acute upper GI bleed 02/11/2020   Loss of weight 06/28/2019   Sleep apnea 11/20/2018   Hypokalemia 11/20/2018   Leukocytosis 11/20/2018   Symptomatic anemia 11/04/2018   Diarrhea, functional 03/15/2015   Fatigue 12/28/2014   Upper abdominal pain 12/28/2014   B12 deficiency 12/28/2014   Acute blood loss anemia 10/02/2013   GI bleed 10/02/2013   Melena 10/02/2013   Hematemesis 10/02/2013   Sinus tachycardia 10/02/2013   Multiple gastric erosions 10/02/2013   Hiatal hernia 10/02/2013   Gastric polyps 10/02/2013   HTN (hypertension)    IDA (iron deficiency anemia)    Adhesive capsulitis of right shoulder  03/01/2012   Iron deficiency anemia 02/02/2012   Distal radius fracture 11/30/2011   Diverticulosis 10/20/2010   Normocytic anemia 10/01/2010   GERD 10/01/2010   FECAL OCCULT BLOOD 10/01/2010    Orientation RESPIRATION BLADDER Height & Weight     Self  O2 Continent Weight:   Height:     BEHAVIORAL SYMPTOMS/MOOD NEUROLOGICAL BOWEL NUTRITION STATUS      Continent Diet (see discharge summary)  AMBULATORY STATUS COMMUNICATION OF NEEDS Skin   Total Care Verbally Surgical wounds                       Personal Care Assistance Level of Assistance  Bathing, Feeding, Dressing Bathing Assistance: Maximum assistance Feeding assistance: Limited assistance Dressing Assistance: Maximum assistance     Functional Limitations Info  Sight, Hearing, Speech Sight Info: Adequate Hearing Info: Impaired Speech Info: Adequate    SPECIAL CARE FACTORS FREQUENCY  PT (By licensed PT), OT (By licensed OT)     PT Frequency: 5x week OT Frequency: 5x week            Contractures Contractures Info: Not present    Additional Factors Info  Code Status, Allergies Code Status Info: full Allergies Info: Sulfa Antibiotics, Naproxen Sodium           Current Medications (06/30/2024):  This is the current hospital active medication list Current Facility-Administered Medications  Medication Dose Route Frequency Provider Last Rate Last Admin   acetaminophen  (TYLENOL ) tablet 650 mg  650 mg Oral Q6H Edna Toribio LABOR, MD  650 mg at 06/30/24 1135   amLODipine  (NORVASC ) tablet 5 mg  5 mg Oral Daily Edna Toribio LABOR, MD   5 mg at 06/30/24 1020   bisacodyl  (DULCOLAX) EC tablet 5 mg  5 mg Oral Daily PRN Edna Toribio LABOR, MD       cholecalciferol  (VITAMIN D3) 25 MCG (1000 UNIT) tablet 1,000 Units  1,000 Units Oral Daily Edna Toribio LABOR, MD   1,000 Units at 06/30/24 1020   enoxaparin  (LOVENOX ) injection 40 mg  40 mg Subcutaneous Q24H Edna Toribio LABOR, MD   40 mg at 06/30/24 1019    escitalopram  (LEXAPRO ) tablet 5 mg  5 mg Oral Daily Edna Toribio LABOR, MD   5 mg at 06/30/24 1020   glucagon  (human recombinant) (GLUCAGEN) injection 1 mg  1 mg Intravenous PRN Edna Toribio LABOR, MD       hydrALAZINE  (APRESOLINE ) injection 10 mg  10 mg Intravenous Q4H PRN Edna Toribio LABOR, MD       ipratropium-albuterol  (DUONEB) 0.5-2.5 (3) MG/3ML nebulizer solution 3 mL  3 mL Nebulization Q4H PRN Edna Toribio LABOR, MD       memantine  (NAMENDA  XR) 24 hr capsule 7 mg  7 mg Oral Daily Edna Toribio LABOR, MD   7 mg at 06/30/24 1021   methocarbamol  (ROBAXIN ) tablet 500 mg  500 mg Oral Q6H PRN Edna Toribio LABOR, MD   500 mg at 06/30/24 1021   Or   methocarbamol  (ROBAXIN ) injection 500 mg  500 mg Intravenous Q6H PRN Edna Toribio LABOR, MD       metoprolol  tartrate (LOPRESSOR ) injection 5 mg  5 mg Intravenous Q4H PRN Edna Toribio LABOR, MD       morphine  (PF) 2 MG/ML injection 0.5 mg  0.5 mg Intravenous Q2H PRN Edna Toribio LABOR, MD       oxyCODONE  (Oxy IR/ROXICODONE ) immediate release tablet 5-10 mg  5-10 mg Oral Q4H PRN Edna Toribio LABOR, MD   5 mg at 06/30/24 1344   pantoprazole  (PROTONIX ) EC tablet 40 mg  40 mg Oral Daily Edna Toribio LABOR, MD   40 mg at 06/30/24 1020   traZODone  (DESYREL ) tablet 25 mg  25 mg Oral QHS PRN Edna Toribio LABOR, MD   25 mg at 06/28/24 2214     Discharge Medications: Please see discharge summary for a list of discharge medications.  Relevant Imaging Results:  Relevant Lab Results:   Additional Information SSN: [id number]  Bridget Cordella Simmonds, LCSW

## 2024-06-30 NOTE — Progress Notes (Signed)
 PROGRESS NOTE    Wanda [name]  FMW:[id number] DOB: 1933/05/06 DOA: 06/28/2024 PCP: Johnson Morna FALCON, NP    Brief Narrative:  89 year old female with Alzheimer's dementia, falling frequently at home, frequent UTIs, hypertension, B12 deficiency, hypokalemia from diuretics, GERD, gastritis, IDA, sleep apnea, urge incontinence and vitamin D  deficiency, history of left hip fracture who still lives alone. She apparently had a fall at 8:30 PM yesterday evening as her son reports he was able to see on a ring camera.  She was found to have severely displaced right hip fracture therefore transferred from [facility] to [facility] for further surgical evaluation.  Patient underwent ORIF on 8/14.  Routine postop management per orthopedic   Assessment & Plan:  Principal Problem:   Closed right hip fracture (HCC) Active Problems:   Normocytic anemia   GERD   HTN (hypertension)   IDA (iron deficiency anemia)   Hypokalemia   Leukocytosis   Dementia due to Alzheimer's disease (HCC)   History of fracture of left hip   Traumatic rhabdomyolysis (HCC)   Dehydration   Malnutrition of moderate degree    Severely displaced right hip fracture - Status post ORIF 8/14.  Routine postop management per their service   Post op recs: WB: WBAT right LE Abx: ancef  x23 hours post op Dressing: keep intact until follow up, change PRN if soiled or saturated. DVT prophylaxis: lovenox  starting POD1 x4 weeks Follow up: 2 weeks after surgery for a wound check with Dr. Edna at [facility].    Mild Rhabdomyolysis - Resolved with IV fluids   Leukocytosis, stable -Reactive in nature.   Dehydration - Improved   Hypokalemia, improved - Repeat as needed   GERD -PPI  Essential hypertension - Norvasc    IDA -Hemoglobin stable continue to closely monitor  Dementia likely from Alzheimer's - Follows outpatient neurology.  Currently on memantine  and Lexapro    DVT  prophylaxis: enoxaparin  (LOVENOX ) injection 40 mg Start: 06/30/24 0900    Code Status: Full Code Family Communication:   Status is: Inpatient Remains inpatient appropriate because: On going management for hip fx  Subjective:  Doing ok no new complaints.  Doing well postop  Examination:  General exam: Appears calm and comfortable  Respiratory system: Clear to auscultation. Respiratory effort normal. Cardiovascular system: S1 & S2 heard, RRR. No JVD, murmurs, rubs, gallops or clicks. No pedal edema. Gastrointestinal system: Abdomen is nondistended, soft and nontender. No organomegaly or masses felt. Normal bowel sounds heard. Central nervous system: Alert and oriented. No focal neurological deficits. Extremities: Symmetric 5 x 5 power. Skin: No rashes, lesions or ulcers.  Surgical site looks okay Psychiatry: Judgement and insight appear normal. Mood & affect appropriate.                Diet Orders (From admission, onward)     Start     Ordered   06/29/24 2310  Diet regular Room service appropriate? Yes; Fluid consistency: Thin  Diet effective now       Question Answer Comment  Room service appropriate? Yes   Fluid consistency: Thin      06/29/24 2309            Objective: Vitals:   06/29/24 2008 06/30/24 0014 06/30/24 0449 06/30/24 0803  BP: 127/74 132/82 (!) 145/76 113/79  Pulse: 92 76 70 69  Resp: 18 15 16 16   Temp: 97.8 F (36.6 C) (!) 97.5 F (36.4 C) 97.6 F (36.4 C) 97.7 F (36.5 C)  TempSrc:  Oral Oral Oral Oral  SpO2: 99% 100% 100% 100%    Intake/Output Summary (Last 24 hours) at 06/30/2024 1226 Last data filed at 06/30/2024 1100 Gross per 24 hour  Intake 913.31 ml  Output 100 ml  Net 813.31 ml   There were no vitals filed for this visit.  Scheduled Meds:  acetaminophen   650 mg Oral Q6H   amLODipine   5 mg Oral Daily   cholecalciferol   1,000 Units Oral Daily   enoxaparin   40 mg Subcutaneous Q24H   escitalopram   5 mg Oral Daily    memantine   7 mg Oral Daily   pantoprazole   40 mg Oral Daily   Continuous Infusions:  Nutritional status Signs/Symptoms: moderate fat depletion, severe muscle depletion Interventions: Ensure Enlive (each supplement provides 350kcal and 20 grams of protein), Liberalize Diet There is no height or weight on file to calculate BMI.  Data Reviewed:   CBC: Recent Labs  Lab 06/28/24 0835 06/29/24 0531 06/30/24 0627  WBC 18.0* 15.8* 15.0*  NEUTROABS 16.4*  --   --   HGB 12.8 11.5* 10.2*  HCT 37.0 33.7* 31.1*  MCV 91.1 92.3 94.0  PLT PLATELET CLUMPS NOTED ON SMEAR, UNABLE TO ESTIMATE 266 255   Basic Metabolic Panel: Recent Labs  Lab 06/28/24 0835 06/29/24 0531 06/29/24 1221 06/30/24 0627  NA 136 137  --  139  K 2.7* 2.8* 3.3* 4.4  CL 94* 101  --  103  CO2 24 26  --  25  GLUCOSE 162* 113*  --  135*  BUN 19 10  --  13  CREATININE 0.63 0.64  --  0.72  CALCIUM  9.5 8.4*  --  8.4*  MG 1.6* 2.4  --  2.0  PHOS  --   --  2.6 3.4   GFR: CrCl cannot be calculated (Unknown ideal weight.). Liver Function Tests: No results for input(s): AST, ALT, ALKPHOS, BILITOT, PROT, ALBUMIN in the last 168 hours. No results for input(s): LIPASE, AMYLASE in the last 168 hours. No results for input(s): AMMONIA in the last 168 hours. Coagulation Profile: No results for input(s): INR, PROTIME in the last 168 hours. Cardiac Enzymes: Recent Labs  Lab 06/28/24 0835 06/29/24 0531 06/30/24 0627  CKTOTAL 1,428* 514* 213   BNP (last 3 results) No results for input(s): PROBNP in the last 8760 hours. HbA1C: No results for input(s): HGBA1C in the last 72 hours. CBG: No results for input(s): GLUCAP in the last 168 hours. Lipid Profile: No results for input(s): CHOL, HDL, LDLCALC, TRIG, CHOLHDL, LDLDIRECT in the last 72 hours. Thyroid Function Tests: No results for input(s): TSH, T4TOTAL, FREET4, T3FREE, THYROIDAB in the last 72 hours. Anemia  Panel: No results for input(s): VITAMINB12, FOLATE, FERRITIN, TIBC, IRON, RETICCTPCT in the last 72 hours. Sepsis Labs: No results for input(s): PROCALCITON, LATICACIDVEN in the last 168 hours.  No results found for this or any previous visit (from the past 240 hours).       Radiology Studies: DG FEMUR, MIN 2 VIEWS RIGHT Result Date: 06/29/2024 CLINICAL DATA:  Elective surgery. EXAM: RIGHT FEMUR 2 VIEWS COMPARISON:  Preoperative imaging FINDINGS: Seven fluoroscopic spot views of the right femur submitted from the operating room. Imaging obtained right femoral intramedullary nail with trans trochanteric and distal locking screw fixation of proximal femur fracture. Fluoroscopy time 1 minutes 14 seconds. Dose 7.96 mGy. IMPRESSION: Intraoperative fluoroscopy during right proximal femur fracture ORIF. Electronically Signed   By: Andrea Gasman M.D.   On: 06/29/2024 18:55   DG  C-Arm 1-60 Min-No Report Result Date: 06/29/2024 Fluoroscopy was utilized by the requesting physician.  No radiographic interpretation.           LOS: 2 days   Time spent= 35 mins    Burgess JAYSON Dare, MD [facility]  If 7PM-7AM, please contact night-coverage  06/30/2024, 12:26 PM

## 2024-07-01 DIAGNOSIS — S72001G Fracture of unspecified part of neck of right femur, subsequent encounter for closed fracture with delayed healing: Secondary | ICD-10-CM | POA: Diagnosis not present

## 2024-07-01 LAB — BASIC METABOLIC PANEL WITH GFR
Anion gap: 10 (ref 5–15)
BUN: 29 mg/dL — ABNORMAL HIGH (ref 8–23)
CO2: 22 mmol/L (ref 22–32)
Calcium: 8.2 mg/dL — ABNORMAL LOW (ref 8.9–10.3)
Chloride: 103 mmol/L (ref 98–111)
Creatinine, Ser: 0.88 mg/dL (ref 0.44–1.00)
GFR, Estimated: >60 mL/min (ref >60)
Glucose, Bld: 135 mg/dL — ABNORMAL HIGH (ref 70–99)
Potassium: 3.5 mmol/L (ref 3.5–5.1)
Sodium: 135 mmol/L (ref 135–145)

## 2024-07-01 LAB — CBC
HCT: 29.1 % — ABNORMAL LOW (ref 36.0–46.0)
Hemoglobin: 9.4 g/dL — ABNORMAL LOW (ref 12.0–15.0)
MCH: 30.7 pg (ref 26.0–34.0)
MCHC: 32.3 g/dL (ref 30.0–36.0)
MCV: 95.1 fL (ref 80.0–100.0)
Platelets: 250 K/uL (ref 150–400)
RBC: 3.06 MIL/uL — ABNORMAL LOW (ref 3.87–5.11)
RDW: 13.8 % (ref 11.5–15.5)
WBC: 10.1 K/uL (ref 4.0–10.5)
nRBC: 0 % (ref 0.0–0.2)

## 2024-07-01 LAB — CK: Total CK: 171 U/L (ref 38–234)

## 2024-07-01 LAB — MAGNESIUM: Magnesium: 2 mg/dL (ref 1.7–2.4)

## 2024-07-01 MED ORDER — OXYCODONE HCL 5 MG PO TABS
5.0000 mg | ORAL_TABLET | ORAL | Status: DC | PRN
  Administered 2024-07-02 – 2024-07-03 (×2): 5 mg via ORAL
  Filled 2024-07-01 (×2): qty 1

## 2024-07-01 NOTE — TOC Progression Note (Signed)
 Transition of Care [facility]) - Progression Note    Patient Details  Name: Wanda [name] MRN: [id number] Date of Birth: [birth year]  [facility] CM/SW Contact  Mellody Masri [place], [place] Phone Number: 07/01/2024, 4:34 PM  Clinical Narrative:    Phone call to patient's son per his request to follow up on status of bed offers for [facility]. No update at this time. Attempt made to contact [facility] at [phone] voicemail was full. Patient's son willing to consider [facility] as well. CSW will send Fl2 for review.  Analeise Mccleery, LCSW [place]  [facility], [facility] Licensed Clinical Social Worker  Direct Dial: [phone]      Expected Discharge Plan: Skilled Nursing Facility Barriers to Discharge: Continued Medical Work up, SNF Pending bed offer               Expected Discharge Plan and Services In-house Referral: Clinical Social Work Discharge Planning Services: CM Consult Post Acute Care Choice: Skilled Nursing Facility Living arrangements for the past 2 months: Single Family Home                                       Social Drivers of Health (SDOH) Interventions SDOH Screenings   Food Insecurity: No Food Insecurity (06/29/2024)  Housing: Low Risk  (06/29/2024)  Transportation Needs: No Transportation Needs (06/29/2024)  Utilities: Not At Risk (06/29/2024)  Social Connections: Unknown (06/29/2024)  Tobacco Use: Low Risk  (06/29/2024)    Readmission Risk Interventions     No data to display

## 2024-07-01 NOTE — Progress Notes (Signed)
 PROGRESS NOTE    Wanda [name]  FMW:[id number] DOB: 02-21-1933 DOA: 06/28/2024 PCP: Johnson Morna FALCON, NP    Brief Narrative:  89 year old female with Alzheimer's dementia, falling frequently at home, frequent UTIs, hypertension, B12 deficiency, hypokalemia from diuretics, GERD, gastritis, IDA, sleep apnea, urge incontinence and vitamin D  deficiency, history of left hip fracture who still lives alone. She apparently had a fall at 8:30 PM yesterday evening as her son reports he was able to see on a ring camera.  She was found to have severely displaced right hip fracture therefore transferred from [facility] to [facility] for further surgical evaluation.  Patient underwent ORIF on 8/14.  Routine postop management per orthopedic   Assessment & Plan:  Principal Problem:   Closed right hip fracture (HCC) Active Problems:   Normocytic anemia   GERD   HTN (hypertension)   IDA (iron deficiency anemia)   Hypokalemia   Leukocytosis   Dementia due to Alzheimer's disease (HCC)   History of fracture of left hip   Traumatic rhabdomyolysis (HCC)   Dehydration   Malnutrition of moderate degree    Severely displaced right hip fracture - Status post ORIF 8/14.  Routine postop management per their service including pain meds and scripts.  PT/OT = SNF  Post op recs: WB: WBAT right LE Abx: ancef  x23 hours post op Dressing: keep intact until follow up, change PRN if soiled or saturated. DVT prophylaxis: lovenox  starting POD1 x4 weeks Follow up: 2 weeks after surgery for a wound check with Dr. Edna at [facility].    Mild Rhabdomyolysis - Resolved with IV fluids   Leukocytosis, stable -Reactive in nature.   Dehydration - Improved   Hypokalemia, improved - Repeat as needed   GERD -PPI  Essential hypertension - Norvasc    IDA -Hemoglobin stable continue to closely monitor  Dementia likely from Alzheimer's - Follows outpatient neurology.  Currently  on memantine  and Lexapro    DVT prophylaxis: Lovenox     Code Status: Full Code Family Communication:   Status is: Inpatient Remains inpatient appropriate because: On going management for hip fx, SNF placement  Subjective: No complaints feels okay Slightly disoriented but able to answer basic questions  Examination:  General exam: Appears calm and comfortable  Respiratory system: Clear to auscultation. Respiratory effort normal. Cardiovascular system: S1 & S2 heard, RRR. No JVD, murmurs, rubs, gallops or clicks. No pedal edema. Gastrointestinal system: Abdomen is nondistended, soft and nontender. No organomegaly or masses felt. Normal bowel sounds heard. Central nervous system: Alert and oriented x 2. No focal neurological deficits. Extremities: Symmetric 5 x 5 power. Skin: No rashes, lesions or ulcers.  Surgical site looks okay Psychiatry: Judgement and insight appear normal. Mood & affect appropriate.                Diet Orders (From admission, onward)     Start     Ordered   06/29/24 2310  Diet regular Room service appropriate? Yes; Fluid consistency: Thin  Diet effective now       Question Answer Comment  Room service appropriate? Yes   Fluid consistency: Thin      06/29/24 2309            Objective: Vitals:   06/30/24 1618 06/30/24 2046 07/01/24 0502 07/01/24 0916  BP: 100/61 115/67 122/76 105/61  Pulse: 79 81 68 78  Resp: 18 15 15 16   Temp: 98.4 F (36.9 C) 98.5 F (36.9 C) 97.8 F (36.6 C) 98.2  F (36.8 C)  TempSrc: Oral Oral Oral Oral  SpO2: 92% 92% 97% 97%    Intake/Output Summary (Last 24 hours) at 07/01/2024 1135 Last data filed at 06/30/2024 1500 Gross per 24 hour  Intake 240 ml  Output --  Net 240 ml   There were no vitals filed for this visit.  Scheduled Meds:  acetaminophen   650 mg Oral Q6H   amLODipine   5 mg Oral Daily   cholecalciferol   1,000 Units Oral Daily   enoxaparin   40 mg Subcutaneous Q24H   escitalopram   5 mg Oral  Daily   memantine   7 mg Oral Daily   pantoprazole   40 mg Oral Daily   Continuous Infusions:  Nutritional status Signs/Symptoms: moderate fat depletion, severe muscle depletion Interventions: Ensure Enlive (each supplement provides 350kcal and 20 grams of protein), Liberalize Diet There is no height or weight on file to calculate BMI.  Data Reviewed:   CBC: Recent Labs  Lab 06/28/24 0835 06/29/24 0531 06/30/24 0627 07/01/24 0951  WBC 18.0* 15.8* 15.0* 10.1  NEUTROABS 16.4*  --   --   --   HGB 12.8 11.5* 10.2* 9.4*  HCT 37.0 33.7* 31.1* 29.1*  MCV 91.1 92.3 94.0 95.1  PLT PLATELET CLUMPS NOTED ON SMEAR, UNABLE TO ESTIMATE 266 255 250   Basic Metabolic Panel: Recent Labs  Lab 06/28/24 0835 06/29/24 0531 06/29/24 1221 06/30/24 0627 07/01/24 0951  NA 136 137  --  139 135  K 2.7* 2.8* 3.3* 4.4 3.5  CL 94* 101  --  103 103  CO2 24 26  --  25 22  GLUCOSE 162* 113*  --  135* 135*  BUN 19 10  --  13 29*  CREATININE 0.63 0.64  --  0.72 0.88  CALCIUM  9.5 8.4*  --  8.4* 8.2*  MG 1.6* 2.4  --  2.0 2.0  PHOS  --   --  2.6 3.4  --    GFR: CrCl cannot be calculated (Unknown ideal weight.). Liver Function Tests: No results for input(s): AST, ALT, ALKPHOS, BILITOT, PROT, ALBUMIN in the last 168 hours. No results for input(s): LIPASE, AMYLASE in the last 168 hours. No results for input(s): AMMONIA in the last 168 hours. Coagulation Profile: No results for input(s): INR, PROTIME in the last 168 hours. Cardiac Enzymes: Recent Labs  Lab 06/28/24 0835 06/29/24 0531 06/30/24 0627 07/01/24 0951  CKTOTAL 1,428* 514* 213 171   BNP (last 3 results) No results for input(s): PROBNP in the last 8760 hours. HbA1C: No results for input(s): HGBA1C in the last 72 hours. CBG: No results for input(s): GLUCAP in the last 168 hours. Lipid Profile: No results for input(s): CHOL, HDL, LDLCALC, TRIG, CHOLHDL, LDLDIRECT in the last 72 hours. Thyroid  Function Tests: No results for input(s): TSH, T4TOTAL, FREET4, T3FREE, THYROIDAB in the last 72 hours. Anemia Panel: No results for input(s): VITAMINB12, FOLATE, FERRITIN, TIBC, IRON, RETICCTPCT in the last 72 hours. Sepsis Labs: No results for input(s): PROCALCITON, LATICACIDVEN in the last 168 hours.  No results found for this or any previous visit (from the past 240 hours).       Radiology Studies: DG FEMUR, MIN 2 VIEWS RIGHT Result Date: 06/29/2024 CLINICAL DATA:  Elective surgery. EXAM: RIGHT FEMUR 2 VIEWS COMPARISON:  Preoperative imaging FINDINGS: Seven fluoroscopic spot views of the right femur submitted from the operating room. Imaging obtained right femoral intramedullary nail with trans trochanteric and distal locking screw fixation of proximal femur fracture. Fluoroscopy time 1 minutes  14 seconds. Dose 7.96 mGy. IMPRESSION: Intraoperative fluoroscopy during right proximal femur fracture ORIF. Electronically Signed   By: Andrea Gasman M.D.   On: 06/29/2024 18:55   DG C-Arm 1-60 Min-No Report Result Date: 06/29/2024 Fluoroscopy was utilized by the requesting physician.  No radiographic interpretation.           LOS: 3 days   Time spent= 35 mins    Burgess JAYSON Dare, MD [facility]  If 7PM-7AM, please contact night-coverage  07/01/2024, 11:35 AM

## 2024-07-01 NOTE — Plan of Care (Signed)
  Problem: Clinical Measurements: Goal: Ability to maintain clinical measurements within normal limits will improve Outcome: Progressing Goal: Will remain free from infection Outcome: Progressing Goal: Respiratory complications will improve Outcome: Progressing Goal: Cardiovascular complication will be avoided Outcome: Progressing   Problem: Education: Goal: Knowledge of General Education information will improve Description: Including pain rating scale, medication(s)/side effects and non-pharmacologic comfort measures Outcome: Not Progressing   Problem: Activity: Goal: Risk for activity intolerance will decrease Outcome: Not Progressing

## 2024-07-02 DIAGNOSIS — S72001G Fracture of unspecified part of neck of right femur, subsequent encounter for closed fracture with delayed healing: Secondary | ICD-10-CM | POA: Diagnosis not present

## 2024-07-02 LAB — CBC
HCT: 29.2 % — ABNORMAL LOW (ref 36.0–46.0)
Hemoglobin: 9.1 g/dL — ABNORMAL LOW (ref 12.0–15.0)
MCH: 30.7 pg (ref 26.0–34.0)
MCHC: 31.2 g/dL (ref 30.0–36.0)
MCV: 98.6 fL (ref 80.0–100.0)
Platelets: 253 K/uL (ref 150–400)
RBC: 2.96 MIL/uL — ABNORMAL LOW (ref 3.87–5.11)
RDW: 13.9 % (ref 11.5–15.5)
WBC: 9.1 K/uL (ref 4.0–10.5)
nRBC: 0 % (ref 0.0–0.2)

## 2024-07-02 LAB — BASIC METABOLIC PANEL WITH GFR
Anion gap: 11 (ref 5–15)
BUN: 25 mg/dL — ABNORMAL HIGH (ref 8–23)
CO2: 22 mmol/L (ref 22–32)
Calcium: 8.4 mg/dL — ABNORMAL LOW (ref 8.9–10.3)
Chloride: 106 mmol/L (ref 98–111)
Creatinine, Ser: 0.72 mg/dL (ref 0.44–1.00)
GFR, Estimated: >60 mL/min (ref >60)
Glucose, Bld: 111 mg/dL — ABNORMAL HIGH (ref 70–99)
Potassium: 3.8 mmol/L (ref 3.5–5.1)
Sodium: 139 mmol/L (ref 135–145)

## 2024-07-02 LAB — MAGNESIUM: Magnesium: 1.8 mg/dL (ref 1.7–2.4)

## 2024-07-02 MED ORDER — METHOCARBAMOL 500 MG PO TABS: 500.0000 mg | ORAL_TABLET | Freq: Four times a day (QID) | ORAL | 0 refills | Status: DC | PRN

## 2024-07-02 MED ORDER — OXYCODONE HCL 5 MG PO TABS: 5.0000 mg | ORAL_TABLET | ORAL | 0 refills | Status: DC | PRN

## 2024-07-02 MED ORDER — BISACODYL 5 MG PO TBEC: 10.0000 mg | DELAYED_RELEASE_TABLET | Freq: Every day | ORAL | Status: DC | PRN

## 2024-07-02 NOTE — Plan of Care (Signed)
  Problem: Clinical Measurements: Goal: Will remain free from infection Outcome: Progressing Goal: Respiratory complications will improve Outcome: Progressing Goal: Cardiovascular complication will be avoided Outcome: Progressing   Problem: Coping: Goal: Level of anxiety will decrease Outcome: Progressing   Problem: Pain Managment: Goal: General experience of comfort will improve and/or be controlled Outcome: Progressing   Problem: Education: Goal: Knowledge of General Education information will improve Description: Including pain rating scale, medication(s)/side effects and non-pharmacologic comfort measures Outcome: Not Progressing   Problem: Activity: Goal: Risk for activity intolerance will decrease Outcome: Not Progressing

## 2024-07-02 NOTE — NC FL2 (Deleted)
 [place]  MEDICAID [id number] LEVEL OF CARE FORM     IDENTIFICATION  Patient Name: Wanda [name] Birthdate: [birth year] Sex: female Admission Date (Current Location): 06/28/2024  [facility] and [place] Number:  Producer, television/film/video and Address:  [facility], [address] [place], [place] [address]      Provider Number: [id number]  Attending Physician Name and Address:  Caleen Burgess BROCKS, MD  Relative Name and Phone Number:  [name], [name] [phone]  [phone]    Current Level of Care: Hospital Recommended Level of Care: Skilled Nursing Facility Prior Approval Number:    Date Approved/Denied:   PASRR Number: [id number]  Discharge Plan: SNF    Current Diagnoses: Patient Active Problem List   Diagnosis Date Noted   Malnutrition of moderate degree 06/30/2024   Closed right hip fracture (HCC) 06/28/2024   History of fracture of left hip 06/28/2024   Traumatic rhabdomyolysis (HCC) 06/28/2024   Dehydration 06/28/2024   Dementia due to Alzheimer's disease (HCC) 04/23/2023   Closed left hip fracture (HCC) 02/16/2023   UTI (urinary tract infection) 02/16/2023   Hypomagnesemia 02/16/2023   S/P repair of paraesophageal hernia 07/08/2021   UGIB (upper gastrointestinal bleed) 03/23/2021   Upper GI bleed 03/22/2021   Acute GI bleeding 12/26/2020   Acute upper GI bleed 02/11/2020   Loss of weight 06/28/2019   Sleep apnea 11/20/2018   Hypokalemia 11/20/2018   Leukocytosis 11/20/2018   Symptomatic anemia 11/04/2018   Diarrhea, functional 03/15/2015   Fatigue 12/28/2014   Upper abdominal pain 12/28/2014   B12 deficiency 12/28/2014   Acute blood loss anemia 10/02/2013   GI bleed 10/02/2013   Melena 10/02/2013   Hematemesis 10/02/2013   Sinus tachycardia 10/02/2013   Multiple gastric erosions 10/02/2013   Hiatal hernia 10/02/2013   Gastric polyps 10/02/2013   HTN (hypertension)    IDA (iron deficiency anemia)    Adhesive capsulitis of right  shoulder 03/01/2012   Iron deficiency anemia 02/02/2012   Distal radius fracture 11/30/2011   Diverticulosis 10/20/2010   Normocytic anemia 10/01/2010   GERD 10/01/2010   FECAL OCCULT BLOOD 10/01/2010    Orientation RESPIRATION BLADDER Height & Weight     Self  O2 Continent Weight:   Height:     BEHAVIORAL SYMPTOMS/MOOD NEUROLOGICAL BOWEL NUTRITION STATUS      Continent Diet (see discharge summary)  AMBULATORY STATUS COMMUNICATION OF NEEDS Skin   Total Care Verbally Surgical wounds                       Personal Care Assistance Level of Assistance  Bathing, Feeding, Dressing Bathing Assistance: Maximum assistance Feeding assistance: Limited assistance Dressing Assistance: Maximum assistance     Functional Limitations Info  Sight, Hearing, Speech Sight Info: Adequate Hearing Info: Impaired Speech Info: Adequate    SPECIAL CARE FACTORS FREQUENCY  PT (By licensed PT), OT (By licensed OT)     PT Frequency: 5x week OT Frequency: 5x week            Contractures Contractures Info: Not present    Additional Factors Info  Code Status, Allergies Code Status Info: full Allergies Info: Sulfa Antibiotics, Naproxen Sodium           Current Medications (07/02/2024):  This is the current hospital active medication list Current Facility-Administered Medications  Medication Dose Route Frequency Provider Last Rate Last Admin   acetaminophen  (TYLENOL ) tablet 650 mg  650 mg Oral Q6H Edna Toribio LABOR, MD  650 mg at 07/02/24 0524   amLODipine  (NORVASC ) tablet 5 mg  5 mg Oral Daily Edna Toribio LABOR, MD   5 mg at 07/02/24 0811   bisacodyl  (DULCOLAX) EC tablet 5 mg  5 mg Oral Daily PRN Edna Toribio LABOR, MD       cholecalciferol  (VITAMIN D3) 25 MCG (1000 UNIT) tablet 1,000 Units  1,000 Units Oral Daily Edna Toribio LABOR, MD   1,000 Units at 07/02/24 9188   enoxaparin  (LOVENOX ) injection 40 mg  40 mg Subcutaneous Q24H Edna Toribio LABOR, MD   40 mg at 07/02/24  0815   escitalopram  (LEXAPRO ) tablet 5 mg  5 mg Oral Daily Edna Toribio LABOR, MD   5 mg at 07/02/24 9188   glucagon  (human recombinant) (GLUCAGEN) injection 1 mg  1 mg Intravenous PRN Edna Toribio LABOR, MD       hydrALAZINE  (APRESOLINE ) injection 10 mg  10 mg Intravenous Q4H PRN Edna Toribio LABOR, MD       ipratropium-albuterol  (DUONEB) 0.5-2.5 (3) MG/3ML nebulizer solution 3 mL  3 mL Nebulization Q4H PRN Edna Toribio LABOR, MD       memantine  (NAMENDA  XR) 24 hr capsule 7 mg  7 mg Oral Daily Edna Toribio LABOR, MD   7 mg at 07/02/24 9189   methocarbamol  (ROBAXIN ) tablet 500 mg  500 mg Oral Q6H PRN Edna Toribio LABOR, MD   500 mg at 07/01/24 2141   Or   methocarbamol  (ROBAXIN ) injection 500 mg  500 mg Intravenous Q6H PRN Edna Toribio LABOR, MD       metoprolol  tartrate (LOPRESSOR ) injection 5 mg  5 mg Intravenous Q4H PRN Edna Toribio LABOR, MD       morphine  (PF) 2 MG/ML injection 0.5 mg  0.5 mg Intravenous Q2H PRN Edna Toribio LABOR, MD       oxyCODONE  (Oxy IR/ROXICODONE ) immediate release tablet 5 mg  5 mg Oral Q4H PRN Amin, Ankit C, MD   5 mg at 07/02/24 0811   pantoprazole  (PROTONIX ) EC tablet 40 mg  40 mg Oral Daily Edna Toribio LABOR, MD   40 mg at 07/02/24 9188   traZODone  (DESYREL ) tablet 25 mg  25 mg Oral QHS PRN Edna Toribio LABOR, MD   25 mg at 07/01/24 2142     Discharge Medications: Please see discharge summary for a list of discharge medications.  Relevant Imaging Results:  Relevant Lab Results:   Additional Information SSN: [id number] [place], [place]

## 2024-07-02 NOTE — NC FL2 (Cosign Needed Addendum)
 [place]  MEDICAID FL2 LEVEL OF CARE FORM     IDENTIFICATION  Patient Name: Wanda [name] Birthdate: [birth year] Sex: female Admission Date (Current Location): 06/28/2024  [facility] [place] Number:  Producer, television/film/video and Address:  [facility] [place], [address] [place], [place] [address]      Provider Number: [id number]  Attending Physician Name and Address:  Caleen Burgess BROCKS, MD  Relative Name and Phone Number:  [name], [name] [phone]  [phone]    Current Level of Care: Hospital Recommended Level of Care: Skilled Nursing Facility Prior Approval Number:    Date Approved/Denied:   PASRR Number: [id number]  Discharge Plan: SNF    Current Diagnoses: Patient Active Problem List   Diagnosis Date Noted   Malnutrition of moderate degree 06/30/2024   Closed right hip fracture (HCC) 06/28/2024   History of fracture of left hip 06/28/2024   Traumatic rhabdomyolysis (HCC) 06/28/2024   Dehydration 06/28/2024   Dementia due to Alzheimer's disease (HCC) 04/23/2023   Closed left hip fracture (HCC) 02/16/2023   UTI (urinary tract infection) 02/16/2023   Hypomagnesemia 02/16/2023   S/P repair of paraesophageal hernia 07/08/2021   UGIB (upper gastrointestinal bleed) 03/23/2021   Upper GI bleed 03/22/2021   Acute GI bleeding 12/26/2020   Acute upper GI bleed 02/11/2020   Loss of weight 06/28/2019   Sleep apnea 11/20/2018   Hypokalemia 11/20/2018   Leukocytosis 11/20/2018   Symptomatic anemia 11/04/2018   Diarrhea, functional 03/15/2015   Fatigue 12/28/2014   Upper abdominal pain 12/28/2014   B12 deficiency 12/28/2014   Acute blood loss anemia 10/02/2013   GI bleed 10/02/2013   Melena 10/02/2013   Hematemesis 10/02/2013   Sinus tachycardia 10/02/2013   Multiple gastric erosions 10/02/2013   Hiatal hernia 10/02/2013   Gastric polyps 10/02/2013   HTN (hypertension)    IDA (iron deficiency anemia)    Adhesive capsulitis of right  shoulder 03/01/2012   Iron deficiency anemia 02/02/2012   Distal radius fracture 11/30/2011   Diverticulosis 10/20/2010   Normocytic anemia 10/01/2010   GERD 10/01/2010   FECAL OCCULT BLOOD 10/01/2010    Orientation RESPIRATION BLADDER Height & Weight     Self  O2 Continent Weight:   Height:     BEHAVIORAL SYMPTOMS/MOOD NEUROLOGICAL BOWEL NUTRITION STATUS      Continent Diet (see discharge summary)  AMBULATORY STATUS COMMUNICATION OF NEEDS Skin   Total Care Verbally Surgical wounds                       Personal Care Assistance Level of Assistance  Bathing, Feeding, Dressing Bathing Assistance: Maximum assistance Feeding assistance: Limited assistance Dressing Assistance: Maximum assistance     Functional Limitations Info  Sight, Hearing, Speech Sight Info: Adequate Hearing Info: Impaired Speech Info: Adequate    SPECIAL CARE FACTORS FREQUENCY  PT (By licensed PT), OT (By licensed OT)     PT Frequency: 5x week OT Frequency: 5x week            Contractures Contractures Info: Not present    Additional Factors Info  Code Status, Allergies Code Status Info: full Allergies Info: Sulfa Antibiotics, Naproxen Sodium           Current Medications (07/02/2024):  This is the current hospital active medication list Current Facility-Administered Medications  Medication Dose Route Frequency Provider Last Rate Last Admin   acetaminophen  (TYLENOL ) tablet 650 mg  650 mg Oral Q6H Edna Toribio LABOR, MD  650 mg at 07/02/24 0524   amLODipine  (NORVASC ) tablet 5 mg  5 mg Oral Daily Edna Toribio LABOR, MD   5 mg at 07/02/24 0811   bisacodyl  (DULCOLAX) EC tablet 5 mg  5 mg Oral Daily PRN Edna Toribio LABOR, MD       cholecalciferol  (VITAMIN D3) 25 MCG (1000 UNIT) tablet 1,000 Units  1,000 Units Oral Daily Edna Toribio LABOR, MD   1,000 Units at 07/02/24 9188   enoxaparin  (LOVENOX ) injection 40 mg  40 mg Subcutaneous Q24H Edna Toribio LABOR, MD   40 mg at 07/02/24  0815   escitalopram  (LEXAPRO ) tablet 5 mg  5 mg Oral Daily Edna Toribio LABOR, MD   5 mg at 07/02/24 9188   glucagon  (human recombinant) (GLUCAGEN) injection 1 mg  1 mg Intravenous PRN Edna Toribio LABOR, MD       hydrALAZINE  (APRESOLINE ) injection 10 mg  10 mg Intravenous Q4H PRN Edna Toribio LABOR, MD       ipratropium-albuterol  (DUONEB) 0.5-2.5 (3) MG/3ML nebulizer solution 3 mL  3 mL Nebulization Q4H PRN Edna Toribio LABOR, MD       memantine  (NAMENDA  XR) 24 hr capsule 7 mg  7 mg Oral Daily Edna Toribio LABOR, MD   7 mg at 07/02/24 9189   methocarbamol  (ROBAXIN ) tablet 500 mg  500 mg Oral Q6H PRN Edna Toribio LABOR, MD   500 mg at 07/01/24 2141   Or   methocarbamol  (ROBAXIN ) injection 500 mg  500 mg Intravenous Q6H PRN Edna Toribio LABOR, MD       metoprolol  tartrate (LOPRESSOR ) injection 5 mg  5 mg Intravenous Q4H PRN Edna Toribio LABOR, MD       morphine  (PF) 2 MG/ML injection 0.5 mg  0.5 mg Intravenous Q2H PRN Edna Toribio LABOR, MD       oxyCODONE  (Oxy IR/ROXICODONE ) immediate release tablet 5 mg  5 mg Oral Q4H PRN Amin, Ankit C, MD   5 mg at 07/02/24 0811   pantoprazole  (PROTONIX ) EC tablet 40 mg  40 mg Oral Daily Edna Toribio LABOR, MD   40 mg at 07/02/24 9188   traZODone  (DESYREL ) tablet 25 mg  25 mg Oral QHS PRN Edna Toribio LABOR, MD   25 mg at 07/01/24 2142     Discharge Medications: Please see discharge summary for a list of discharge medications.  Relevant Imaging Results:  Relevant Lab Results:   Additional Information SSN: [id number] [place], [place]

## 2024-07-02 NOTE — Progress Notes (Signed)
 PROGRESS NOTE    Wanda [name]  FMW:[id number] DOB: 10/24/1933 DOA: 06/28/2024 PCP: Johnson Morna FALCON, NP    Brief Narrative:  89 year old female with Alzheimer's dementia, falling frequently at home, frequent UTIs, hypertension, B12 deficiency, hypokalemia from diuretics, GERD, gastritis, IDA, sleep apnea, urge incontinence and vitamin D  deficiency, history of left hip fracture who still lives alone. She apparently had a fall at 8:30 PM yesterday evening as her son reports he was able to see on a ring camera.  She was found to have severely displaced right hip fracture therefore transferred from [facility] to [facility] for further surgical evaluation.  Patient underwent ORIF on 8/14.  Routine postop management per orthopedic   Assessment & Plan:  Principal Problem:   Closed right hip fracture (HCC) Active Problems:   Normocytic anemia   GERD   HTN (hypertension)   IDA (iron deficiency anemia)   Hypokalemia   Leukocytosis   Dementia due to Alzheimer's disease (HCC)   History of fracture of left hip   Traumatic rhabdomyolysis (HCC)   Dehydration   Malnutrition of moderate degree    Severely displaced right hip fracture - Status post ORIF 8/14.  Routine postop management per their service including pain meds and scripts.  PT/OT = SNF  Post op recs: WB: WBAT right LE Abx: ancef  x23 hours post op Dressing: keep intact until follow up, change PRN if soiled or saturated. DVT prophylaxis: lovenox  starting POD1 x4 weeks Follow up: 2 weeks after surgery for a wound check with Dr. Edna at [facility].    Mild Rhabdomyolysis - Resolved with IV fluids   Leukocytosis, stable -Reactive in nature.   Dehydration - Improved   Hypokalemia, improved - Repeat as needed   GERD -PPI  Essential hypertension - Norvasc    IDA -Hemoglobin stable continue to closely monitor  Dementia likely from Alzheimer's - Follows outpatient neurology.  Currently  on memantine  and Lexapro    DVT prophylaxis: Lovenox     Code Status: Full Code Family Communication: Family periodically up-to-date Status is: Inpatient Remains inpatient appropriate because: On going management for hip fx, SNF placement  Subjective: No complaints feeling okay  Examination:  General exam: Appears calm and comfortable  Respiratory system: Clear to auscultation. Respiratory effort normal. Cardiovascular system: S1 & S2 heard, RRR. No JVD, murmurs, rubs, gallops or clicks. No pedal edema. Gastrointestinal system: Abdomen is nondistended, soft and nontender. No organomegaly or masses felt. Normal bowel sounds heard. Central nervous system: Alert and oriented x 2. No focal neurological deficits. Extremities: Symmetric 5 x 5 power. Skin: No rashes, lesions or ulcers.  Surgical site looks okay Psychiatry: Judgement and insight appear normal. Mood & affect appropriate.                Diet Orders (From admission, onward)     Start     Ordered   06/29/24 2310  Diet regular Room service appropriate? Yes; Fluid consistency: Thin  Diet effective now       Question Answer Comment  Room service appropriate? Yes   Fluid consistency: Thin      06/29/24 2309            Objective: Vitals:   07/01/24 1352 07/01/24 2025 07/02/24 0521 07/02/24 0818  BP: 94/67 105/61 (!) 108/58 107/61  Pulse: 90 92 75 78  Resp: 15 17 16 17   Temp: 98.7 F (37.1 C) 99 F (37.2 C) 98.1 F (36.7 C) 97.9 F (36.6 C)  TempSrc: Oral  Oral Oral Oral  SpO2: 94% 97% 97% 96%    Intake/Output Summary (Last 24 hours) at 07/02/2024 1031 Last data filed at 07/02/2024 0700 Gross per 24 hour  Intake 720 ml  Output 500 ml  Net 220 ml   There were no vitals filed for this visit.  Scheduled Meds:  acetaminophen   650 mg Oral Q6H   amLODipine   5 mg Oral Daily   cholecalciferol   1,000 Units Oral Daily   enoxaparin   40 mg Subcutaneous Q24H   escitalopram   5 mg Oral Daily   memantine   7  mg Oral Daily   pantoprazole   40 mg Oral Daily   Continuous Infusions:  Nutritional status Signs/Symptoms: moderate fat depletion, severe muscle depletion Interventions: Ensure Enlive (each supplement provides 350kcal and 20 grams of protein), Liberalize Diet There is no height or weight on file to calculate BMI.  Data Reviewed:   CBC: Recent Labs  Lab 06/28/24 0835 06/29/24 0531 06/30/24 0627 07/01/24 0951 07/02/24 0653  WBC 18.0* 15.8* 15.0* 10.1 9.1  NEUTROABS 16.4*  --   --   --   --   HGB 12.8 11.5* 10.2* 9.4* 9.1*  HCT 37.0 33.7* 31.1* 29.1* 29.2*  MCV 91.1 92.3 94.0 95.1 98.6  PLT PLATELET CLUMPS NOTED ON SMEAR, UNABLE TO ESTIMATE 266 255 250 253   Basic Metabolic Panel: Recent Labs  Lab 06/28/24 0835 06/29/24 0531 06/29/24 1221 06/30/24 0627 07/01/24 0951 07/02/24 0653  NA 136 137  --  139 135 139  K 2.7* 2.8* 3.3* 4.4 3.5 3.8  CL 94* 101  --  103 103 106  CO2 24 26  --  25 22 22   GLUCOSE 162* 113*  --  135* 135* 111*  BUN 19 10  --  13 29* 25*  CREATININE 0.63 0.64  --  0.72 0.88 0.72  CALCIUM  9.5 8.4*  --  8.4* 8.2* 8.4*  MG 1.6* 2.4  --  2.0 2.0 1.8  PHOS  --   --  2.6 3.4  --   --    GFR: CrCl cannot be calculated (Unknown ideal weight.). Liver Function Tests: No results for input(s): AST, ALT, ALKPHOS, BILITOT, PROT, ALBUMIN in the last 168 hours. No results for input(s): LIPASE, AMYLASE in the last 168 hours. No results for input(s): AMMONIA in the last 168 hours. Coagulation Profile: No results for input(s): INR, PROTIME in the last 168 hours. Cardiac Enzymes: Recent Labs  Lab 06/28/24 0835 06/29/24 0531 06/30/24 0627 07/01/24 0951  CKTOTAL 1,428* 514* 213 171   BNP (last 3 results) No results for input(s): PROBNP in the last 8760 hours. HbA1C: No results for input(s): HGBA1C in the last 72 hours. CBG: No results for input(s): GLUCAP in the last 168 hours. Lipid Profile: No results for input(s): CHOL,  HDL, LDLCALC, TRIG, CHOLHDL, LDLDIRECT in the last 72 hours. Thyroid Function Tests: No results for input(s): TSH, T4TOTAL, FREET4, T3FREE, THYROIDAB in the last 72 hours. Anemia Panel: No results for input(s): VITAMINB12, FOLATE, FERRITIN, TIBC, IRON, RETICCTPCT in the last 72 hours. Sepsis Labs: No results for input(s): PROCALCITON, LATICACIDVEN in the last 168 hours.  No results found for this or any previous visit (from the past 240 hours).       Radiology Studies: No results found.         LOS: 4 days   Time spent= 35 mins    Burgess JAYSON Dare, MD [facility]  If 7PM-7AM, please contact night-coverage  07/02/2024, 10:31 AM

## 2024-07-03 DIAGNOSIS — S72001G Fracture of unspecified part of neck of right femur, subsequent encounter for closed fracture with delayed healing: Secondary | ICD-10-CM | POA: Diagnosis not present

## 2024-07-03 LAB — CBC
HCT: 29.1 % — ABNORMAL LOW (ref 36.0–46.0)
Hemoglobin: 9.5 g/dL — ABNORMAL LOW (ref 12.0–15.0)
MCH: 31.1 pg (ref 26.0–34.0)
MCHC: 32.6 g/dL (ref 30.0–36.0)
MCV: 95.4 fL (ref 80.0–100.0)
Platelets: 292 K/uL (ref 150–400)
RBC: 3.05 MIL/uL — ABNORMAL LOW (ref 3.87–5.11)
RDW: 13.9 % (ref 11.5–15.5)
WBC: 8.6 K/uL (ref 4.0–10.5)
nRBC: 0 % (ref 0.0–0.2)

## 2024-07-03 LAB — BASIC METABOLIC PANEL WITH GFR
Anion gap: 7 (ref 5–15)
BUN: 19 mg/dL (ref 8–23)
CO2: 27 mmol/L (ref 22–32)
Calcium: 8.3 mg/dL — ABNORMAL LOW (ref 8.9–10.3)
Chloride: 104 mmol/L (ref 98–111)
Creatinine, Ser: 0.73 mg/dL (ref 0.44–1.00)
GFR, Estimated: >60 mL/min (ref >60)
Glucose, Bld: 113 mg/dL — ABNORMAL HIGH (ref 70–99)
Potassium: 4 mmol/L (ref 3.5–5.1)
Sodium: 138 mmol/L (ref 135–145)

## 2024-07-03 LAB — MAGNESIUM: Magnesium: 1.9 mg/dL (ref 1.7–2.4)

## 2024-07-03 NOTE — Discharge Summary (Signed)
 Physician Discharge Summary  KELISE KUCH FMW:[id number] DOB: 06/26/1933 DOA: 06/28/2024  PCP: Johnson Morna FALCON, NP  Admit date: 06/28/2024 Discharge date: 07/03/2024  Admitted From:Home Disposition:  SNF  Recommendations for Outpatient Follow-up:  Follow up with PCP in 1-2 weeks Please obtain BMP/CBC in 3-5 days  Pain meds with bowel regimen Post op instructions as below.  Outptn ortho follow up   Discharge Condition: Stable CODE STATUS: Full Diet recommendation: Regular  Brief/Interim Summary: Brief Narrative:  89 year old female with Alzheimer's dementia, falling frequently at home, frequent UTIs, hypertension, B12 deficiency, hypokalemia from diuretics, GERD, gastritis, IDA, sleep apnea, urge incontinence and vitamin D  deficiency, history of left hip fracture who still lives alone. She apparently had a fall at 8:30 PM yesterday evening as her son reports he was able to see on a ring camera.  She was found to have severely displaced right hip fracture therefore transferred from [facility] to [facility] for further surgical evaluation.  Patient underwent ORIF on 8/14.  Routine postop management per orthopedic  SNF placement.    Assessment & Plan:  Principal Problem:   Closed right hip fracture (HCC) Active Problems:   Normocytic anemia   GERD   HTN (hypertension)   IDA (iron deficiency anemia)   Hypokalemia   Leukocytosis   Dementia due to Alzheimer's disease (HCC)   History of fracture of left hip   Traumatic rhabdomyolysis (HCC)   Dehydration   Malnutrition of moderate degree    Severely displaced right hip fracture - Status post ORIF 8/14.  Routine postop management per their service including pain meds and scripts.  PT/OT = SNF  Post op recs: WB: WBAT right LE Abx: ancef  x23 hours post op Dressing: keep intact until follow up, change PRN if soiled or saturated. DVT prophylaxis: lovenox  starting POD1 x4 weeks Follow up: 2 weeks after surgery for  a wound check with Dr. Edna at [facility].    Mild Rhabdomyolysis - Resolved with IV fluids   Leukocytosis, stable -Reactive in nature.   Dehydration - Improved   Hypokalemia, improved - Repeat as needed   GERD -PPI  Essential hypertension - Norvasc    IDA -Hemoglobin stable continue to closely monitor  Dementia likely from Alzheimer's - Follows outpatient neurology.  Currently on memantine  and Lexapro    DVT prophylaxis: Lovenox     Code Status: Full Code Family Communication: Family periodically up-to-date Status is: Inpatient Remains inpatient appropriate because: On going management for hip fx, SNF placement  Subjective: Doing ok no complaints.   Examination:  General exam: Appears calm and comfortable  Respiratory system: Clear to auscultation. Respiratory effort normal. Cardiovascular system: S1 & S2 heard, RRR. No JVD, murmurs, rubs, gallops or clicks. No pedal edema. Gastrointestinal system: Abdomen is nondistended, soft and nontender. No organomegaly or masses felt. Normal bowel sounds heard. Central nervous system: Alert and oriented x 2. No focal neurological deficits. Extremities: Symmetric 5 x 5 power. Skin: No rashes, lesions or ulcers.  Surgical site looks okay Psychiatry: Judgement and insight appear normal. Mood & affect appropriate.    Discharge Diagnoses:  Principal Problem:   Closed right hip fracture (HCC) Active Problems:   Normocytic anemia   GERD   HTN (hypertension)   IDA (iron deficiency anemia)   Hypokalemia   Leukocytosis   Dementia due to Alzheimer's disease (HCC)   History of fracture of left hip   Traumatic rhabdomyolysis (HCC)   Dehydration   Malnutrition of moderate degree  Discharge Exam: Vitals:   07/03/24 0444 07/03/24 0830  BP: 130/73 122/81  Pulse: 63 85  Resp: 16 16  Temp: (!) 97.4 F (36.3 C) 97.8 F (36.6 C)  SpO2: 98% 98%   Vitals:   07/02/24 1707 07/02/24 1953 07/03/24 0444  07/03/24 0830  BP: 103/77 121/62 130/73 122/81  Pulse: 86  63 85  Resp: 17 18 16 16   Temp: 98.6 F (37 C) 97.8 F (36.6 C) (!) 97.4 F (36.3 C) 97.8 F (36.6 C)  TempSrc: Oral Oral  Oral  SpO2: 97% 98% 98% 98%      Discharge Instructions   Allergies as of 07/03/2024       Reactions   Sulfa Antibiotics    unknown   Naproxen Sodium Rash, Other (See Comments)   Passed out        Medication List     TAKE these medications    acetaminophen  500 MG tablet Commonly known as: TYLENOL  Take 500-1,000 mg by mouth every 6 (six) hours as needed for moderate pain (pain score 4-6).   amLODipine  5 MG tablet Commonly known as: NORVASC  Take 1 tablet (5 mg total) by mouth daily.   bisacodyl  5 MG EC tablet Commonly known as: DULCOLAX Take 2 tablets (10 mg total) by mouth daily as needed for moderate constipation or severe constipation.   cyanocobalamin  1000 MCG/ML injection Commonly known as: VITAMIN B12 Inject 1,000 mcg into the muscle every 30 (thirty) days.   escitalopram  5 MG tablet Commonly known as: LEXAPRO  Take 1 tablet (5 mg total) by mouth daily.   FIBER 7 PO Fiber   hydrochlorothiazide  12.5 MG tablet Commonly known as: HYDRODIURIL  Take 12.5 mg by mouth every morning.   memantine  7 MG Cp24 24 hr capsule Commonly known as: NAMENDA  XR Take 7 mg by mouth daily.   methocarbamol  500 MG tablet Commonly known as: ROBAXIN  Take 1 tablet (500 mg total) by mouth every 6 (six) hours as needed for muscle spasms.   omeprazole  20 MG capsule Commonly known as: PRILOSEC  Take 20 mg by mouth daily.   oxyCODONE  5 MG immediate release tablet Commonly known as: Oxy IR/ROXICODONE  Take 1 tablet (5 mg total) by mouth every 4 (four) hours as needed for breakthrough pain or severe pain (pain score 7-10) ((for MODERATE breakthrough pain)).   VITAMIN D -3 PO Take 1 tablet by mouth daily.        Follow-up Information     Reyne Cordella SQUIBB, MD Follow up.   Specialty:  Orthopedic Surgery Contact information: [address] [place] [address] [phone]         Johnson Morna FALCON, NP Follow up in 1 week(s).   Specialty: Nurse Practitioner Contact information: [address] [place] [address] [phone]                Allergies  Allergen Reactions   Sulfa Antibiotics     unknown   Naproxen Sodium Rash and Other (See Comments)    Passed out    You were cared for by a hospitalist during your hospital stay. If you have any questions about your discharge medications or the care you received while you were in the hospital after you are discharged, you can call the unit and asked to speak with the hospitalist on call if the hospitalist that took care of you is not available. Once you are discharged, your primary care physician will handle any further medical issues. Please note that no refills for any discharge  medications will be authorized once you are discharged, as it is imperative that you return to your primary care physician (or establish a relationship with a primary care physician if you do not have one) for your aftercare needs so that they can reassess your need for medications and monitor your lab values.  You were cared for by a hospitalist during your hospital stay. If you have any questions about your discharge medications or the care you received while you were in the hospital after you are discharged, you can call the unit and asked to speak with the hospitalist on call if the hospitalist that took care of you is not available. Once you are discharged, your primary care physician will handle any further medical issues. Please note that NO REFILLS for any discharge medications will be authorized once you are discharged, as it is imperative that you return to your primary care physician (or establish a relationship with a primary care physician if you do not have one) for your aftercare needs so that they can reassess your need for  medications and monitor your lab values.  Please request your Prim.MD to go over all Hospital Tests and Procedure/Radiological results at the follow up, please get all Hospital records sent to your Prim MD by signing hospital release before you go home.  Get CBC, CMP, 2 view Chest X ray checked  by Primary MD during your next visit or SNF MD in 5-7 days ( we routinely change or add medications that can affect your baseline labs and fluid status, therefore we recommend that you get the mentioned basic workup next visit with your PCP, your PCP may decide not to get them or add new tests based on their clinical decision)  On your next visit with your primary care physician please Get Medicines reviewed and adjusted.  If you experience worsening of your admission symptoms, develop shortness of breath, life threatening emergency, suicidal or homicidal thoughts you must seek medical attention immediately by calling 911 or calling your MD immediately  if symptoms less severe.  You Must read complete instructions/literature along with all the possible adverse reactions/side effects for all the Medicines you take and that have been prescribed to you. Take any new Medicines after you have completely understood and accpet all the possible adverse reactions/side effects.   Do not drive, operate heavy machinery, perform activities at heights, swimming or participation in water  activities or provide baby sitting services if your were admitted for syncope or siezures until you have seen by Primary MD or a Neurologist and advised to do so again.  Do not drive when taking Pain medications.   Procedures/Studies: DG FEMUR, MIN 2 VIEWS RIGHT Result Date: 06/29/2024 CLINICAL DATA:  Elective surgery. EXAM: RIGHT FEMUR 2 VIEWS COMPARISON:  Preoperative imaging FINDINGS: Seven fluoroscopic spot views of the right femur submitted from the operating room. Imaging obtained right femoral intramedullary nail with trans  trochanteric and distal locking screw fixation of proximal femur fracture. Fluoroscopy time 1 minutes 14 seconds. Dose 7.96 mGy. IMPRESSION: Intraoperative fluoroscopy during right proximal femur fracture ORIF. Electronically Signed   By: Andrea Gasman M.D.   On: 06/29/2024 18:55   DG C-Arm 1-60 Min-No Report Result Date: 06/29/2024 Fluoroscopy was utilized by the requesting physician.  No radiographic interpretation.   DG Chest Portable 1 View Result Date: 06/28/2024 CLINICAL DATA:  Fall.  Right hip fracture. EXAM: PORTABLE CHEST 1 VIEW COMPARISON:  February 16, 2023. FINDINGS: Stable cardiomediastinal silhouette. Both lungs are clear.  The visualized skeletal structures are unremarkable. IMPRESSION: No active disease. Electronically Signed   By: Lynwood Landy Raddle M.D.   On: 06/28/2024 08:39   DG Hip Unilat With Pelvis 2-3 Views Right Result Date: 06/28/2024 CLINICAL DATA:  Right hip pain after fall last night. EXAM: DG HIP (WITH OR WITHOUT PELVIS) 2-3V RIGHT COMPARISON:  April 30, 2023. FINDINGS: Severely displaced and comminuted intertrochanteric fracture of proximal right femur is noted. Status post surgical internal fixation of old proximal left femoral fracture. IMPRESSION: Severely displaced and comminuted intertrochanteric fracture of proximal right femur. Electronically Signed   By: Lynwood Landy Raddle M.D.   On: 06/28/2024 08:38   CT Cervical Spine Wo Contrast Result Date: 06/28/2024 CLINICAL DATA:  89 year old female status post trip and fall last night. Hip pain. EXAM: CT CERVICAL SPINE WITHOUT CONTRAST TECHNIQUE: Multidetector CT imaging of the cervical spine was performed without intravenous contrast. Multiplanar CT image reconstructions were also generated. RADIATION DOSE REDUCTION: This exam was performed according to the departmental dose-optimization program which includes automated exposure control, adjustment of the mA and/or kV according to patient size and/or use of iterative reconstruction  technique. COMPARISON:  Head CT today.  Cervical spine CT 01/15/2024. FINDINGS: Alignment: Stable cervical lordosis. Stable mild degenerative anterolisthesis of C7 on T1, with bilateral facet ankylosis. Bilateral posterior element alignment is within normal limits. Skull base and vertebrae: Chronic osteopenia. Visualized skull base is intact. No atlanto-occipital dissociation. C1 and C2 appear intact and aligned. No acute osseous abnormality identified. Soft tissues and spinal canal: No prevertebral fluid or swelling. No visible canal hematoma. Some negative visible noncontrast neck soft tissues aside from calcified carotid artery atherosclerosis. Disc levels: Mild for age cervical spine degeneration at many levels. Chronic degenerative facet ankylosis at C7-T1. Right greater than left facet hypertrophy in general. Advanced chronic disc and endplate degeneration at C6-C7. Upper chest: Visible upper thoracic levels appear stable. Stable lung apices. IMPRESSION: 1. No acute traumatic injury identified in the cervical spine. 2. Chronic osteopenia and cervical spine degeneration. Electronically Signed   By: VEAR Hurst M.D.   On: 06/28/2024 08:25   CT Head Wo Contrast Result Date: 06/28/2024 CLINICAL DATA:  89 year old female status post trip and fall last night. Hip pain. EXAM: CT HEAD WITHOUT CONTRAST TECHNIQUE: Contiguous axial images were obtained from the base of the skull through the vertex without intravenous contrast. RADIATION DOSE REDUCTION: This exam was performed according to the departmental dose-optimization program which includes automated exposure control, adjustment of the mA and/or kV according to patient size and/or use of iterative reconstruction technique. COMPARISON:  Head CT 01/15/2024. FINDINGS: Brain: Stable cerebral volume. Disproportionate mesial and anterior temporal lobe atrophy (coronal image 39). No midline shift, ventriculomegaly, mass effect, evidence of mass lesion, intracranial  hemorrhage or evidence of cortically based acute infarction. Patchy mild to moderate for age cerebral white matter hypodensity is stable. Vascular: From Calcified atherosclerosis at the skull base. No suspicious intracranial vascular hyperdensity. Skull: Stable and intact.  Asymmetric left TMJ degeneration. Sinuses/Orbits: Visualized paranasal sinuses and mastoids are stable and well aerated. Other: No acute orbit or scalp soft tissue injury identified. Calcified scalp vessel atherosclerosis. IMPRESSION: 1. No acute intracranial abnormality or acute traumatic injury identified. 2. Temporal lobe atrophy, Cerebral Atrophy (ICD10-G31.9). Chronic cerebral white matter changes. Electronically Signed   By: VEAR Hurst M.D.   On: 06/28/2024 08:23     The results of significant diagnostics from this hospitalization (including imaging, microbiology, ancillary and laboratory) are listed below for reference.  Microbiology: No results found for this or any previous visit (from the past 240 hours).   Labs: BNP (last 3 results) No results for input(s): BNP in the last 8760 hours. Basic Metabolic Panel: Recent Labs  Lab 06/29/24 0531 06/29/24 1221 06/30/24 0627 07/01/24 0951 07/02/24 0653 07/03/24 0505  NA 137  --  139 135 139 138  K 2.8* 3.3* 4.4 3.5 3.8 4.0  CL 101  --  103 103 106 104  CO2 26  --  25 22 22 27   GLUCOSE 113*  --  135* 135* 111* 113*  BUN 10  --  13 29* 25* 19  CREATININE 0.64  --  0.72 0.88 0.72 0.73  CALCIUM  8.4*  --  8.4* 8.2* 8.4* 8.3*  MG 2.4  --  2.0 2.0 1.8 1.9  PHOS  --  2.6 3.4  --   --   --    Liver Function Tests: No results for input(s): AST, ALT, ALKPHOS, BILITOT, PROT, ALBUMIN in the last 168 hours. No results for input(s): LIPASE, AMYLASE in the last 168 hours. No results for input(s): AMMONIA in the last 168 hours. CBC: Recent Labs  Lab 06/28/24 0835 06/29/24 0531 06/30/24 0627 07/01/24 0951 07/02/24 0653 07/03/24 0505  WBC 18.0*  15.8* 15.0* 10.1 9.1 8.6  NEUTROABS 16.4*  --   --   --   --   --   HGB 12.8 11.5* 10.2* 9.4* 9.1* 9.5*  HCT 37.0 33.7* 31.1* 29.1* 29.2* 29.1*  MCV 91.1 92.3 94.0 95.1 98.6 95.4  PLT PLATELET CLUMPS NOTED ON SMEAR, UNABLE TO ESTIMATE 266 255 250 253 292   Cardiac Enzymes: Recent Labs  Lab 06/28/24 0835 06/29/24 0531 06/30/24 0627 07/01/24 0951  CKTOTAL 1,428* 514* 213 171   BNP: Invalid input(s): POCBNP CBG: No results for input(s): GLUCAP in the last 168 hours. D-Dimer No results for input(s): DDIMER in the last 72 hours. Hgb A1c No results for input(s): HGBA1C in the last 72 hours. Lipid Profile No results for input(s): CHOL, HDL, LDLCALC, TRIG, CHOLHDL, LDLDIRECT in the last 72 hours. Thyroid function studies No results for input(s): TSH, T4TOTAL, T3FREE, THYROIDAB in the last 72 hours.  Invalid input(s): FREET3 Anemia work up No results for input(s): VITAMINB12, FOLATE, FERRITIN, TIBC, IRON, RETICCTPCT in the last 72 hours. Urinalysis    Component Value Date/Time   COLORURINE STRAW (A) 02/17/2023 0907   APPEARANCEUR CLEAR 02/17/2023 0907   LABSPEC 1.005 02/17/2023 0907   PHURINE 7.0 02/17/2023 0907   GLUCOSEU NEGATIVE 02/17/2023 0907   HGBUR MODERATE (A) 02/17/2023 0907   BILIRUBINUR NEGATIVE 02/17/2023 0907   KETONESUR NEGATIVE 02/17/2023 0907   PROTEINUR NEGATIVE 02/17/2023 0907   UROBILINOGEN 0.2 05/11/2012 1000   NITRITE NEGATIVE 02/17/2023 0907   LEUKOCYTESUR NEGATIVE 02/17/2023 0907   Sepsis Labs Recent Labs  Lab 06/30/24 0627 07/01/24 0951 07/02/24 0653 07/03/24 0505  WBC 15.0* 10.1 9.1 8.6   Microbiology No results found for this or any previous visit (from the past 240 hours).   Time coordinating discharge:  I have spent 35 minutes face to face with the patient and on the ward discussing the patients care, assessment, plan and disposition with other care givers. >50% of the time was devoted  counseling the patient about the risks and benefits of treatment/Discharge disposition and coordinating care.   SIGNED:   Burgess JAYSON Dare, MD  [facility] 07/03/2024, 10:55 AM   If 7PM-7AM, please contact night-coverage

## 2024-07-03 NOTE — Progress Notes (Signed)
 RN called [facility] in [place] and gave report to [place], Charity fundraiser and she stated Understanding IV has been removed and patient resting in room waiting for son to bring clothes. He will transport her AVS printed in chart at bedside

## 2024-07-03 NOTE — TOC Transition Note (Signed)
 Transition of Care [facility]) - Discharge Note   Patient Details  Name: Wanda [name] MRN: [id number] Date of Birth: 1933-05-02  Transition of Care [facility]) CM/SW Contact:  Bridget Cordella Simmonds, LCSW Phone Number: 07/03/2024, 11:31 AM   Clinical Narrative:   Pt discharging to [facility] [place].  RN call report to [phone].  Pt son Gerlene will transport pt to SNF. Pt will need to be brought down to main north tower entrance with assistance getting into the vehicle.     Final next level of care: Skilled Nursing Facility Barriers to Discharge: Barriers Resolved   Patient Goals and CMS Choice     Choice offered to / list presented to : Adult Children (son Gerlene)      Discharge Placement              Patient chooses bed at:  [facility]) Patient to be transferred to facility by: son Gerlene Name of family member notified: son Gerlene Patient and family notified of of transfer: 07/03/24  Discharge Plan and Services Additional resources added to the After Visit Summary for   In-house Referral: Clinical Social Work Discharge Planning Services: [employer] Consult Post Acute Care Choice: Skilled Nursing Facility                               Social Drivers of Health (SDOH) Interventions SDOH Screenings   Food Insecurity: No Food Insecurity (06/29/2024)  Housing: Low Risk  (06/29/2024)  Transportation Needs: No Transportation Needs (06/29/2024)  Utilities: Not At Risk (06/29/2024)  Social Connections: Unknown (06/29/2024)  Tobacco Use: Low Risk  (06/29/2024)     Readmission Risk Interventions     No data to display

## 2024-07-03 NOTE — Plan of Care (Signed)
  Problem: Clinical Measurements: Goal: Will remain free from infection Outcome: Progressing Goal: Respiratory complications will improve Outcome: Progressing Goal: Cardiovascular complication will be avoided Outcome: Progressing   Problem: Education: Goal: Knowledge of General Education information will improve Description: Including pain rating scale, medication(s)/side effects and non-pharmacologic comfort measures Outcome: Not Progressing   Problem: Activity: Goal: Risk for activity intolerance will decrease Outcome: Not Progressing

## 2024-07-03 NOTE — TOC Progression Note (Addendum)
 Transition of Care [facility]) - Progression Note    Patient Details  Name: Wanda [name] MRN: [id number] Date of Birth: 1932-12-14  [facility]) CM/SW Contact  Bridget Cordella Simmonds, LCSW Phone Number: 07/03/2024, 10:23 AM  Clinical Narrative:   [facility] does offer bed, can receive pt today.  CSW confirmed with son Gerlene that they do want to accept and that [facility] not able to provide any type of LTC.  Phil confirms plan is for DC home after SNF.  MD informed.  1120: Per PT, pt does not require ambulance transport.  CSW spoke with son Gerlene regarding transportation no being medically necessary and he will transport pt.  DC summary faxed to [facility].    Expected Discharge Plan: Skilled Nursing Facility Barriers to Discharge: Continued Medical Work up, SNF Pending bed offer               Expected Discharge Plan and Services In-house Referral: Clinical Social Work Discharge Planning Services: CM Consult Post Acute Care Choice: Skilled Nursing Facility Living arrangements for the past 2 months: Single Family Home                                       Social Drivers of Health (SDOH) Interventions SDOH Screenings   Food Insecurity: No Food Insecurity (06/29/2024)  Housing: Low Risk  (06/29/2024)  Transportation Needs: No Transportation Needs (06/29/2024)  Utilities: Not At Risk (06/29/2024)  Social Connections: Unknown (06/29/2024)  Tobacco Use: Low Risk  (06/29/2024)    Readmission Risk Interventions     No data to display

## 2024-07-03 NOTE — Care Management Important Message (Signed)
 Important Message  Patient Details  Name: Wanda [name] MRN: [id number] Date of Birth: [birth year]   Important Message Given:  Yes - Medicare IM     Jon Cruel 07/03/2024, 1:22 PM

## 2024-09-01 ENCOUNTER — Ambulatory Visit (INDEPENDENT_AMBULATORY_CARE_PROVIDER_SITE_OTHER): Admitting: Physician Assistant

## 2024-09-01 VITALS — HR 81 | Ht <= 58 in | Wt 114.0 lb

## 2024-09-01 DIAGNOSIS — F0280 Dementia in other diseases classified elsewhere without behavioral disturbance: Secondary | ICD-10-CM

## 2024-09-01 DIAGNOSIS — G309 Alzheimer's disease, unspecified: Secondary | ICD-10-CM | POA: Diagnosis not present

## 2024-09-01 NOTE — Progress Notes (Signed)
 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 RH female with a history of hypertension, hyperlipidemia, recent right hip fracture status post ORIF August 2025 and a history of dementia likely due to Alzheimer's disease seen today in follow up for memory loss. Patient is currently on memantine  XR 7 mg daily.  Memory decline is reported by her son, especially after hospitalization for hip fracture.  Discussed increasing memantine  XR to 14 mg daily, gradually to 28 mg daily.  Patient needs assistance with ADLs, 24/7 monitoring for safety. Mood is controlled.     Follow up in 6  months. Continue memantine  XR, increase to 14 mg daily, goal to 28 mg daily  Continue 24/7 monitoring** Continue PT Recommend good control of her cardiovascular risk factors Continue to control mood as per PCP with Lexapro  daily     Subjective:    This patient is accompanied in the office by her son who supplements the history.  Previous records as well as any outside records available were reviewed prior to todays visit. Patient was last seen on 10/26/2023.    Any changes in memory since last visit? My memory is bad, especially after the surgery.  She has more difficulty with short-term memory including conversations, name, instructions, naming objects.  She wrote a book about her life and her son reads it to her to allow her to remember repeats oneself?  Endorsed Disoriented when walking into a room? Has some trouble remembering which room to go. She does not know that this is her house, that she is staying in there but is not hers     Leaving objects?  No because her mobility has decreased   Wandering behavior?  denies   Any personality changes since last visit?  Mood is good, no anger issues. She was more confused when she was at the Rehab-son says Any worsening depression?:  Denies.   Hallucinations or  paranoia?  No hallucinations, but she is paranoid about money , that will not have enough money to pay a service like a doctor's appointment. She he is always concerned that the doors are locked. Seizures? denies    Any sleep changes?  Sleeps well. Denies vivid dreams, REM behavior or sleepwalking   Sleep apnea?   Denies.   Any hygiene concerns?  She needs reminder to shower.  Many times she soils, cannot make it to the bathroom   Independent of bathing and dressing?  To get dressing to shower. Does the patient needs help with medications? Son is in charge   Who is in charge of the finances?   Son is in charge     Any changes in appetite?  She may forget that she eats and she eats again.   Patient have trouble swallowing? Denies.   Does the patient cook? No Any headaches?   denies   Any vision changes?denies Chronic back pain  denies   Ambulates with difficulty?  She had a recent right hip fracture requiring ORIF on August 2025 transferred to SNF, uses a walker for stability and falls  Recent  or head injuries? Denies.     Unilateral weakness, numbness or tingling? denies   Any tremors?  Denies    Any anosmia?  Denies   Any incontinence of urine?  Endorsed, wears diapers   Any bowel dysfunction?   Stool incontinence.     Patient lives home with 24/7  HHN     Does the patient drive? No longer drives     Initial visit 04/26/2023 How long did patient have memory difficulties? A few years, worse over the last 6 months following a fall. . Patient has some difficulty remembering recent conversations and people's names, and instructions. I asked her to put the meds and wallet in one place before leaving, and she did not remember the instructions. She has also been having trouble naming objects.  She could not remember her room after she returned from her nursing home. She does sundown  at times.I think I had a 3rd child but I don't know what happened to him. (This is his son, who is  alive) repeats oneself?  Endorsed Leaving objects in unusual places?   denies   Wandering behavior? denies   Any personality changes ? denies   Any history of depression?: denies   Hallucinations or paranoia?  She was worried about people taking money from her-when she was in the hospital.  Seizures? denies    Any sleep changes?  Denies although she did have vivid dreams when hospitalized for UTI , Denies REM behavior or sleepwalking   Sleep apnea? denies   Any hygiene concerns?  denies   Independent of bathing and dressing?  She needs to be reminded, needs assistance Does the patient need help with medications? Son and 3 sitters rotating throughout the day is in charge   Who is in charge of the finances?  Son is in charge     Any changes in appetite? Forgets she ate and eats again     Patient have trouble swallowing?  denies   Does the patient cook?  Any kitchen accidents such as leaving the stove on? Patient denies   Any headaches?  denies   Chronic back pain?  denies   Ambulates with difficulty?  She uses a walker for safety to prevent falls.    Recent falls or head injuries? denies     Vision changes? Unilateral weakness, numbness or tingling?  denies   Any tremors?  denies   Any anosmia?  denies   Any incontinence of urine? denies   Any bowel dysfunction? denies      Patient lives by herself with sitters      History of heavy alcohol intake? denies   History of heavy tobacco use? denies   Family history of dementia?  Does not know     Does patient drive? No   Retired [employer] operational officer  PREVIOUS MEDICATIONS:   CURRENT MEDICATIONS:  Outpatient Encounter Medications as of 09/01/2024  Medication Sig   acetaminophen  (TYLENOL ) 500 MG tablet Take 500-1,000 mg by mouth every 6 (six) hours as needed for moderate pain (pain score 4-6).   amLODipine  (NORVASC ) 5 MG tablet Take 1 tablet (5 mg total) by mouth daily.   Cholecalciferol  (VITAMIN D -3 PO) Take 1 tablet by mouth daily.    cyanocobalamin  (,VITAMIN B-12,) 1000 MCG/ML injection Inject 1,000 mcg into the muscle every 30 (thirty) days.   escitalopram  (LEXAPRO ) 5 MG tablet Take 1 tablet (5 mg total) by mouth daily.   hydrochlorothiazide  (HYDRODIURIL ) 12.5 MG tablet Take 12.5 mg by mouth every morning.   memantine  (NAMENDA  XR) 7 MG CP24 24 hr capsule Take 7 mg by mouth daily.   Misc Natural Products (FIBER 7 PO) Fiber   omeprazole  (PRILOSEC ) 20 MG capsule Take 20 mg by mouth daily.   [DISCONTINUED] bisacodyl  (DULCOLAX) 5 MG EC tablet Take 2 tablets (10 mg total)  by mouth daily as needed for moderate constipation or severe constipation.   [DISCONTINUED] methocarbamol  (ROBAXIN ) 500 MG tablet Take 1 tablet (500 mg total) by mouth every 6 (six) hours as needed for muscle spasms.   [DISCONTINUED] oxyCODONE  (OXY IR/ROXICODONE ) 5 MG immediate release tablet Take 1 tablet (5 mg total) by mouth every 4 (four) hours as needed for breakthrough pain or severe pain (pain score 7-10) ((for MODERATE breakthrough pain)).   No facility-administered encounter medications on file as of 09/01/2024.        No data to display            04/23/2023    5:00 PM  Montreal Cognitive Assessment   Visuospatial/ Executive (0/5) 0  Naming (0/3) 2  Attention: Read list of digits (0/2) 2  Attention: Read list of letters (0/1) 1  Attention: Serial 7 subtraction starting at 100 (0/3) 1  Language: Repeat phrase (0/2) 1  Language : Fluency (0/1) 1  Abstraction (0/2) 2  Delayed Recall (0/5) 0  Orientation (0/6) 4  Total 14  Adjusted Score (based on education) 15    Objective:     PHYSICAL EXAMINATION:    VITALS:   Vitals:   09/01/24 1406  Pulse: 81  SpO2: 96%  Weight: 114 lb (51.7 kg)  Height: 4' 10 (1.473 m)    GEN:  The patient appears stated age and is in NAD. HEENT:  Normocephalic, atraumatic.   Neurological examination:  General: NAD, well-groomed, appears stated age. Orientation: The patient is alert. Oriented to  person, not to place and date Cranial nerves: There is good facial symmetry.The speech is fluent and clear. No aphasia or dysarthria. Fund of knowledge is reduced. Recent and remote memory are impaired. Attention and concentration are reduced. Able to name objects and repeat phrases.  Hearing is intact to conversational tone.  Sensation: Sensation is intact to light touch throughout Motor: Strength is at least antigravity x4. DTR's 2/4 in UE/LE     Movement examination: Tone: There is normal tone in the UE/LE Abnormal movements:  no tremor.  No myoclonus.  No asterixis.   Coordination:  There is no decremation with RAM's. Normal finger to nose  Gait and Station: The patient is in wheelchair, gait not tested    Thank you for allowing us  the opportunity to participate in the care of this nice patient. Please do not hesitate to contact us  for any questions or concerns.   Total time spent on today's visit was 33 minutes dedicated to this patient today, preparing to see patient, examining the patient, ordering tests and/or medications and counseling the patient, documenting clinical information in the EHR or other health record, independently interpreting results and communicating results to the patient/family, discussing treatment and goals, answering patient's questions and coordinating care.  Cc:  Johnson Morna FALCON, NP  Camie Sevin 09/01/2024 4:44 PM

## 2024-09-01 NOTE — Patient Instructions (Addendum)
 It was a pleasure to see you today at our office.   Recommendation   Follow up in 6 months Recommend 24/7 monitoring Continue  memantine  XR, increase to 14 mg nightly  Continue Lexapro  for mood       RECOMMENDATIONS FOR ALL PATIENTS WITH MEMORY PROBLEMS: 1. Continue to exercise (Recommend 30 minutes of walking everyday, or 3 hours every week) 2. Increase social interactions - continue going to [place] and enjoy social gatherings with friends and family 3. Eat healthy, avoid fried foods and eat more fruits and vegetables 4. Maintain adequate blood pressure, blood sugar, and blood cholesterol level. Reducing the risk of stroke and cardiovascular disease also helps promoting better memory. 5. Avoid stressful situations. Live a simple life and avoid aggravations. Organize your time and prepare for the next day in anticipation. 6. Sleep well, avoid any interruptions of sleep and avoid any distractions in the bedroom that may interfere with adequate sleep quality 7. Avoid sugar, avoid sweets as there is a strong link between excessive sugar intake, diabetes, and cognitive impairment We discussed the Mediterranean diet, which has been shown to help patients reduce the risk of progressive memory disorders and reduces cardiovascular risk. This includes eating fish, eat fruits and green leafy vegetables, nuts like almonds and hazelnuts, walnuts, and also use olive oil. Avoid fast foods and fried foods as much as possible. Avoid sweets and sugar as sugar use has been linked to worsening of memory function.  There is always a concern of gradual progression of memory problems. If this is the case, then we may need to adjust level of care according to patient needs. Support, both to the patient and caregiver, should then be put into place.       FALL PRECAUTIONS: Be cautious when walking. Scan the area for obstacles that may increase the risk of trips and falls. When getting up in the mornings, sit up  at the edge of the bed for a few minutes before getting out of bed. Consider elevating the bed at the head end to avoid drop of blood pressure when getting up. Walk always in a well-lit room (use night lights in the walls). Avoid area rugs or power cords from appliances in the middle of the walkways. Use a walker or a cane if necessary and consider physical therapy for balance exercise. Get your eyesight checked regularly.  FINANCIAL OVERSIGHT: Supervision, especially oversight when making financial decisions or transactions is also recommended.  HOME SAFETY: Consider the safety of the kitchen when operating appliances like stoves, microwave oven, and blender. Consider having supervision and share cooking responsibilities until no longer able to participate in those. Accidents with firearms and other hazards in the house should be identified and addressed as well.   ABILITY TO BE LEFT ALONE: If patient is unable to contact 911 operator, consider using LifeLine, or when the need is there, arrange for someone to stay with patients. Smoking is a fire hazard, consider supervision or cessation. Risk of wandering should be assessed by caregiver and if detected at any point, supervision and safe proof recommendations should be instituted.  MEDICATION SUPERVISION: Inability to self-administer medication needs to be constantly addressed. Implement a mechanism to ensure safe administration of the medications.   DRIVING: Regarding driving, in patients with progressive memory problems, driving will be impaired. We advise to have someone else do the driving if trouble finding directions or if minor accidents are reported. Independent driving assessment is available to determine safety of driving.  If you are interested in the driving assessment, you can contact the following:  [employer] in [place] [phone]  [facility] [phone]  [facility]  [phone] or [phone]    Mediterranean Diet A Mediterranean diet refers to food and lifestyle choices that are based on the traditions of countries located on the [employer] [place]. This way of eating has been shown to help prevent certain conditions and improve outcomes for people who have chronic diseases, like kidney disease and heart disease. What are tips for following this plan? Lifestyle  Cook and eat meals together with your family, when possible. Drink enough fluid to keep your urine clear or pale yellow. Be physically active every day. This includes: Aerobic exercise like running or swimming. Leisure activities like gardening, walking, or housework. Get 7-8 hours of sleep each night. If recommended by your health care provider, drink red wine in moderation. This means 1 glass a day for nonpregnant women and 2 glasses a day for men. A glass of wine equals 5 oz (150 mL). Reading food labels  Check the serving size of packaged foods. For foods such as rice and pasta, the serving size refers to the amount of cooked product, not dry. Check the total fat in packaged foods. Avoid foods that have saturated fat or trans fats. Check the ingredients list for added sugars, such as corn syrup. Shopping  At the grocery store, buy most of your food from the areas near the walls of the store. This includes: Fresh fruits and vegetables (produce). Grains, beans, nuts, and seeds. Some of these may be available in unpackaged forms or large amounts (in bulk). Fresh seafood. Poultry and eggs. Low-fat dairy products. Buy whole ingredients instead of prepackaged foods. Buy fresh fruits and vegetables in-season from local farmers markets. Buy frozen fruits and vegetables in resealable bags. If you do not have access to quality fresh seafood, buy precooked frozen shrimp or canned fish, such as tuna, salmon, or sardines. Buy small amounts of raw or cooked  vegetables, salads, or olives from the deli or salad bar at your store. Stock your pantry so you always have certain foods on hand, such as olive oil, canned tuna, canned tomatoes, rice, pasta, and beans. Cooking  Cook foods with extra-virgin olive oil instead of using butter or other vegetable oils. Have meat as a side dish, and have vegetables or grains as your main dish. This means having meat in small portions or adding small amounts of meat to foods like pasta or stew. Use beans or vegetables instead of meat in common dishes like chili or lasagna. Experiment with different cooking methods. Try roasting or broiling vegetables instead of steaming or sauteing them. Add frozen vegetables to soups, stews, pasta, or rice. Add nuts or seeds for added healthy fat at each meal. You can add these to yogurt, salads, or vegetable dishes. Marinate fish or vegetables using olive oil, lemon juice, garlic, and fresh herbs. Meal planning  Plan to eat 1 vegetarian meal one day each week. Try to work up to 2 vegetarian meals, if possible. Eat seafood 2 or more times a week. Have healthy snacks readily available, such as: Vegetable sticks with hummus. Greek yogurt. Fruit and nut trail mix. Eat balanced meals throughout the week. This includes: Fruit: 2-3 servings a day Vegetables: 4-5 servings a day Low-fat dairy: 2 servings a day Fish, poultry, or lean meat: 1 serving a day Beans and legumes: 2 or more  servings a week Nuts and seeds: 1-2 servings a day Whole grains: 6-8 servings a day Extra-virgin olive oil: 3-4 servings a day Limit red meat and sweets to only a few servings a month What are my food choices? Mediterranean diet Recommended Grains: Whole-grain pasta. Brown rice. Bulgar wheat. Polenta. Couscous. Whole-wheat bread. [place] Madeira. Vegetables: Artichokes. Beets. Broccoli. Cabbage. Carrots. Eggplant. Green beans. Chard. Kale. Spinach. Onions. Leeks. Peas. Squash. Tomatoes. Peppers.  Radishes. Fruits: Apples. Apricots. Avocado. Berries. Bananas. Cherries. Dates. Figs. Grapes. Lemons. Melon. Oranges. Peaches. Plums. Pomegranate. Meats and other protein foods: Beans. Almonds. Sunflower seeds. Pine nuts. Peanuts. Cod. Salmon. Scallops. Shrimp. Tuna. Tilapia. Clams. Oysters. Eggs. Dairy: Low-fat milk. Cheese. Greek yogurt. Beverages: Water . Red wine. Herbal tea. Fats and oils: Extra virgin olive oil. Avocado oil. Grape seed oil. Sweets and desserts: [place] yogurt with honey. Baked apples. Poached pears. Trail mix. Seasoning and other foods: Basil. Cilantro. Coriander. Cumin. Mint. Parsley. Sage. Rosemary. Tarragon. Garlic. Oregano. Thyme. Pepper. Balsalmic vinegar. Tahini. Hummus. Tomato sauce. Olives. Mushrooms. Limit these Grains: Prepackaged pasta or rice dishes. Prepackaged cereal with added sugar. Vegetables: Deep fried potatoes (french fries). Fruits: Fruit canned in syrup. Meats and other protein foods: Beef. Pork. Lamb. Poultry with skin. Hot dogs. [place]. Dairy: Ice cream. Sour cream. Whole milk. Beverages: Juice. Sugar-sweetened soft drinks. Beer. Liquor and spirits. Fats and oils: Butter. Canola oil. Vegetable oil. Beef fat (tallow). Lard. Sweets and desserts: Cookies. Cakes. Pies. Candy. Seasoning and other foods: Mayonnaise. Premade sauces and marinades. The items listed may not be a complete list. Talk with your dietitian about what dietary choices are right for you. Summary The Mediterranean diet includes both food and lifestyle choices. Eat a variety of fresh fruits and vegetables, beans, nuts, seeds, and whole grains. Limit the amount of red meat and sweets that you eat. Talk with your health care provider about whether it is safe for you to drink red wine in moderation. This means 1 glass a day for nonpregnant women and 2 glasses a day for men. A glass of wine equals 5 oz (150 mL). This information is not intended to replace advice given to you by your health  care provider. Make sure you discuss any questions you have with your health care provider. Document Released: 06/25/2016 Document Revised: 07/28/2016 Document Reviewed: 06/25/2016 Elsevier Interactive Patient Education  2017 [employer].

## 2024-10-09 ENCOUNTER — Encounter: Payer: Self-pay | Admitting: Physician Assistant

## 2024-10-19 ENCOUNTER — Other Ambulatory Visit: Payer: Self-pay | Admitting: Physician Assistant

## 2024-10-19 MED ORDER — MEMANTINE HCL ER 21 MG PO CP24: 21.0000 mg | ORAL_CAPSULE | Freq: Every day | ORAL | 3 refills | Status: AC

## 2024-11-16 ENCOUNTER — Encounter: Payer: Self-pay | Admitting: Gastroenterology

## 2025-03-02 ENCOUNTER — Ambulatory Visit: Admitting: Physician Assistant
